# Patient Record
Sex: Male | Born: 1935 | Race: White | Hispanic: No | Marital: Married | Smoking: Former smoker
Health system: Southern US, Community
[De-identification: ages and names within clinical notes are randomized; demographics above are authoritative.]

## PROBLEM LIST (undated history)

## (undated) DIAGNOSIS — G709 Myoneural disorder, unspecified: Secondary | ICD-10-CM

## (undated) DIAGNOSIS — H3530 Unspecified macular degeneration: Secondary | ICD-10-CM

## (undated) DIAGNOSIS — D649 Anemia, unspecified: Secondary | ICD-10-CM

## (undated) DIAGNOSIS — I499 Cardiac arrhythmia, unspecified: Secondary | ICD-10-CM

## (undated) DIAGNOSIS — R0602 Shortness of breath: Secondary | ICD-10-CM

## (undated) DIAGNOSIS — L02619 Cutaneous abscess of unspecified foot: Secondary | ICD-10-CM

## (undated) DIAGNOSIS — M1990 Unspecified osteoarthritis, unspecified site: Secondary | ICD-10-CM

## (undated) DIAGNOSIS — I739 Peripheral vascular disease, unspecified: Secondary | ICD-10-CM

## (undated) DIAGNOSIS — E119 Type 2 diabetes mellitus without complications: Secondary | ICD-10-CM

## (undated) DIAGNOSIS — K219 Gastro-esophageal reflux disease without esophagitis: Secondary | ICD-10-CM

## (undated) DIAGNOSIS — L03039 Cellulitis of unspecified toe: Secondary | ICD-10-CM

## (undated) DIAGNOSIS — K5900 Constipation, unspecified: Secondary | ICD-10-CM

## (undated) DIAGNOSIS — I2510 Atherosclerotic heart disease of native coronary artery without angina pectoris: Secondary | ICD-10-CM

## (undated) DIAGNOSIS — G459 Transient cerebral ischemic attack, unspecified: Secondary | ICD-10-CM

## (undated) DIAGNOSIS — J449 Chronic obstructive pulmonary disease, unspecified: Secondary | ICD-10-CM

## (undated) DIAGNOSIS — J189 Pneumonia, unspecified organism: Secondary | ICD-10-CM

## (undated) DIAGNOSIS — I639 Cerebral infarction, unspecified: Secondary | ICD-10-CM

## (undated) DIAGNOSIS — I10 Essential (primary) hypertension: Secondary | ICD-10-CM

## (undated) DIAGNOSIS — Z9981 Dependence on supplemental oxygen: Secondary | ICD-10-CM

## (undated) HISTORY — PX: CARDIAC CATHETERIZATION: SHX172

## (undated) HISTORY — PX: FOOT SURGERY: SHX648

## (undated) HISTORY — PX: TRANSURETHRAL RESECTION OF PROSTATE: SHX73

## (undated) HISTORY — PX: VASCULAR SURGERY: SHX849

## (undated) HISTORY — PX: COLONOSCOPY: SHX174

---

## 2005-04-07 ENCOUNTER — Emergency Department (HOSPITAL_COMMUNITY): Admission: EM | Admit: 2005-04-07 | Discharge: 2005-04-07 | Payer: Self-pay | Admitting: Emergency Medicine

## 2005-06-10 ENCOUNTER — Encounter (INDEPENDENT_AMBULATORY_CARE_PROVIDER_SITE_OTHER): Payer: Self-pay | Admitting: Specialist

## 2005-06-10 ENCOUNTER — Ambulatory Visit (HOSPITAL_COMMUNITY): Admission: RE | Admit: 2005-06-10 | Discharge: 2005-06-10 | Payer: Self-pay | Admitting: Urology

## 2008-09-23 DIAGNOSIS — G459 Transient cerebral ischemic attack, unspecified: Secondary | ICD-10-CM

## 2008-09-23 HISTORY — DX: Transient cerebral ischemic attack, unspecified: G45.9

## 2008-11-02 ENCOUNTER — Inpatient Hospital Stay (HOSPITAL_COMMUNITY): Admission: AD | Admit: 2008-11-02 | Discharge: 2008-11-07 | Payer: Self-pay | Admitting: Internal Medicine

## 2008-11-02 ENCOUNTER — Ambulatory Visit: Payer: Self-pay | Admitting: Radiology

## 2008-11-02 ENCOUNTER — Encounter: Payer: Self-pay | Admitting: Emergency Medicine

## 2008-11-02 ENCOUNTER — Ambulatory Visit: Payer: Self-pay | Admitting: Cardiology

## 2008-11-03 ENCOUNTER — Ambulatory Visit: Payer: Self-pay | Admitting: Vascular Surgery

## 2008-11-03 ENCOUNTER — Encounter (INDEPENDENT_AMBULATORY_CARE_PROVIDER_SITE_OTHER): Payer: Self-pay | Admitting: Internal Medicine

## 2008-11-04 ENCOUNTER — Encounter (INDEPENDENT_AMBULATORY_CARE_PROVIDER_SITE_OTHER): Payer: Self-pay | Admitting: Gastroenterology

## 2009-01-22 ENCOUNTER — Inpatient Hospital Stay (HOSPITAL_COMMUNITY): Admission: EM | Admit: 2009-01-22 | Discharge: 2009-01-25 | Payer: Self-pay | Admitting: Internal Medicine

## 2009-01-22 ENCOUNTER — Ambulatory Visit: Payer: Self-pay | Admitting: Diagnostic Radiology

## 2009-01-22 ENCOUNTER — Encounter: Payer: Self-pay | Admitting: Emergency Medicine

## 2009-01-22 ENCOUNTER — Ambulatory Visit: Payer: Self-pay | Admitting: Cardiovascular Disease

## 2011-01-01 LAB — CBC
HCT: 39.3 % (ref 39.0–52.0)
Hemoglobin: 11.9 g/dL — ABNORMAL LOW (ref 13.0–17.0)
Hemoglobin: 12.9 g/dL — ABNORMAL LOW (ref 13.0–17.0)
MCHC: 34.1 g/dL (ref 30.0–36.0)
MCHC: 32.7 g/dL (ref 30.0–36.0)
MCV: 86.3 fL (ref 78.0–100.0)
MCV: 86.8 fL (ref 78.0–100.0)
MCV: 87 fL (ref 78.0–100.0)
RBC: 4.52 MIL/uL (ref 4.22–5.81)
RDW: 15.4 % (ref 11.5–15.5)
RDW: 14.7 % (ref 11.5–15.5)
WBC: 8.7 10*3/uL (ref 4.0–10.5)

## 2011-01-01 LAB — CROSSMATCH: ABO/RH(D): O POS

## 2011-01-01 LAB — APTT: aPTT: 42 seconds — ABNORMAL HIGH (ref 24–37)

## 2011-01-01 LAB — BASIC METABOLIC PANEL
BUN: 18 mg/dL (ref 6–23)
Calcium: 8.9 mg/dL (ref 8.4–10.5)
GFR calc non Af Amer: >60 mL/min (ref >60)
Glucose, Bld: 136 mg/dL — ABNORMAL HIGH (ref 70–99)

## 2011-01-01 LAB — HEMOGLOBIN AND HEMATOCRIT, BLOOD
HCT: 34.6 % — ABNORMAL LOW (ref 39.0–52.0)
HCT: 35.7 % — ABNORMAL LOW (ref 39.0–52.0)
Hemoglobin: 11.7 g/dL — ABNORMAL LOW (ref 13.0–17.0)

## 2011-01-01 LAB — PHOSPHORUS: Phosphorus: 3.3 mg/dL (ref 2.3–4.6)

## 2011-01-01 LAB — COMPREHENSIVE METABOLIC PANEL
ALT: 11 U/L (ref 0–53)
ALT: 11 U/L (ref 0–53)
Alkaline Phosphatase: 75 U/L (ref 39–117)
BUN: 16 mg/dL (ref 6–23)
CO2: 28 mEq/L (ref 19–32)
Calcium: 9.1 mg/dL (ref 8.4–10.5)
Calcium: 9.3 mg/dL (ref 8.4–10.5)
Chloride: 108 mEq/L (ref 96–112)
Creatinine, Ser: 0.9 mg/dL (ref 0.4–1.5)
GFR calc Af Amer: >60 mL/min (ref >60)
GFR calc non Af Amer: >60 mL/min (ref >60)
Glucose, Bld: 96 mg/dL (ref 70–99)
Glucose, Bld: 134 mg/dL — ABNORMAL HIGH (ref 70–99)
Potassium: 4 mEq/L (ref 3.5–5.1)
Sodium: 142 mEq/L (ref 135–145)
Sodium: 144 mEq/L (ref 135–145)
Total Protein: 7.2 g/dL (ref 6.0–8.3)
Total Protein: 8.3 g/dL (ref 6.0–8.3)

## 2011-01-01 LAB — GLUCOSE, CAPILLARY
Glucose-Capillary: 105 mg/dL — ABNORMAL HIGH (ref 70–99)
Glucose-Capillary: 120 mg/dL — ABNORMAL HIGH (ref 70–99)
Glucose-Capillary: 123 mg/dL — ABNORMAL HIGH (ref 70–99)
Glucose-Capillary: 128 mg/dL — ABNORMAL HIGH (ref 70–99)
Glucose-Capillary: 129 mg/dL — ABNORMAL HIGH (ref 70–99)
Glucose-Capillary: 135 mg/dL — ABNORMAL HIGH (ref 70–99)
Glucose-Capillary: 135 mg/dL — ABNORMAL HIGH (ref 70–99)
Glucose-Capillary: 98 mg/dL (ref 70–99)

## 2011-01-01 LAB — DIFFERENTIAL
Eosinophils Absolute: 0 10*3/uL (ref 0.0–0.7)
Eosinophils Absolute: 0 10*3/uL (ref 0.0–0.7)
Lymphocytes Relative: 22 % (ref 12–46)
Lymphs Abs: 1.9 10*3/uL (ref 0.7–4.0)
Lymphs Abs: 1.3 10*3/uL (ref 0.7–4.0)
Monocytes Relative: 13 % — ABNORMAL HIGH (ref 3–12)
Neutro Abs: 5.5 10*3/uL (ref 1.7–7.7)
Neutro Abs: 9.3 10*3/uL — ABNORMAL HIGH (ref 1.7–7.7)
Neutrophils Relative %: 64 % (ref 43–77)
Neutrophils Relative %: 78 % — ABNORMAL HIGH (ref 43–77)

## 2011-01-01 LAB — LIPID PANEL
LDL Cholesterol: 83 mg/dL (ref 0–99)
Total CHOL/HDL Ratio: 5.2 RATIO
Triglycerides: 105 mg/dL (ref <150)

## 2011-01-01 LAB — PROTIME-INR
INR: 2.4 — ABNORMAL HIGH (ref 0.00–1.49)
Prothrombin Time: 28.1 seconds — ABNORMAL HIGH (ref 11.6–15.2)

## 2011-01-01 LAB — HEMOCCULT GUIAC POC 1CARD (OFFICE): Fecal Occult Bld: POSITIVE

## 2011-01-01 LAB — MAGNESIUM: Magnesium: 2.2 mg/dL (ref 1.5–2.5)

## 2011-01-01 LAB — HEMOGLOBIN A1C: Hgb A1c MFr Bld: 6.1 % (ref 4.6–6.1)

## 2011-01-08 LAB — PHOSPHORUS: Phosphorus: 3.7 mg/dL (ref 2.3–4.6)

## 2011-01-08 LAB — CROSSMATCH

## 2011-01-08 LAB — BASIC METABOLIC PANEL
BUN: 17 mg/dL (ref 6–23)
CO2: 27 mEq/L (ref 19–32)
Calcium: 8.8 mg/dL (ref 8.4–10.5)
Chloride: 103 mEq/L (ref 96–112)
Chloride: 108 mEq/L (ref 96–112)
Creatinine, Ser: 0.93 mg/dL (ref 0.4–1.5)
GFR calc Af Amer: >60 mL/min (ref >60)
GFR calc Af Amer: >60 mL/min (ref >60)
GFR calc non Af Amer: >60 mL/min (ref >60)
Potassium: 4.1 mEq/L (ref 3.5–5.1)
Sodium: 135 mEq/L (ref 135–145)
Sodium: 135 mEq/L (ref 135–145)

## 2011-01-08 LAB — HEMOCCULT GUIAC POC 1CARD (OFFICE): Fecal Occult Bld: POSITIVE

## 2011-01-08 LAB — COMPREHENSIVE METABOLIC PANEL
ALT: 19 U/L (ref 0–53)
ALT: 23 U/L (ref 0–53)
AST: 25 U/L (ref 0–37)
Albumin: 3.4 g/dL — ABNORMAL LOW (ref 3.5–5.2)
Albumin: 4.3 g/dL (ref 3.5–5.2)
Alkaline Phosphatase: 57 U/L (ref 39–117)
Alkaline Phosphatase: 79 U/L (ref 39–117)
Chloride: 105 mEq/L (ref 96–112)
GFR calc Af Amer: >60 mL/min (ref >60)
Glucose, Bld: 131 mg/dL — ABNORMAL HIGH (ref 70–99)
Potassium: 4.4 mEq/L (ref 3.5–5.1)
Potassium: 4.6 mEq/L (ref 3.5–5.1)
Sodium: 136 mEq/L (ref 135–145)
Sodium: 139 mEq/L (ref 135–145)
Total Bilirubin: 0.9 mg/dL (ref 0.3–1.2)
Total Protein: 7.5 g/dL (ref 6.0–8.3)

## 2011-01-08 LAB — CBC
HCT: 21.4 % — ABNORMAL LOW (ref 39.0–52.0)
HCT: 29.4 % — ABNORMAL LOW (ref 39.0–52.0)
HCT: 29.6 % — ABNORMAL LOW (ref 39.0–52.0)
HCT: 26.3 % — ABNORMAL LOW (ref 39.0–52.0)
Hemoglobin: 10 g/dL — ABNORMAL LOW (ref 13.0–17.0)
Hemoglobin: 10.1 g/dL — ABNORMAL LOW (ref 13.0–17.0)
Hemoglobin: 11.3 g/dL — ABNORMAL LOW (ref 13.0–17.0)
Hemoglobin: 8.9 g/dL — ABNORMAL LOW (ref 13.0–17.0)
MCHC: 34.5 g/dL (ref 30.0–36.0)
MCHC: 34.9 g/dL (ref 30.0–36.0)
MCHC: 35.1 g/dL (ref 30.0–36.0)
MCHC: 35.5 g/dL (ref 30.0–36.0)
MCHC: 35.5 g/dL (ref 30.0–36.0)
MCV: 91.1 fL (ref 78.0–100.0)
MCV: 91.4 fL (ref 78.0–100.0)
MCV: 91.9 fL (ref 78.0–100.0)
MCV: 93.4 fL (ref 78.0–100.0)
MCV: 93.7 fL (ref 78.0–100.0)
MCV: 95.4 fL (ref 78.0–100.0)
Platelets: 191 10*3/uL (ref 150–400)
Platelets: 198 10*3/uL (ref 150–400)
Platelets: 208 10*3/uL (ref 150–400)
Platelets: 250 10*3/uL (ref 150–400)
Platelets: 229 10*3/uL (ref 150–400)
RBC: 2.25 MIL/uL — ABNORMAL LOW (ref 4.22–5.81)
RBC: 3.03 MIL/uL — ABNORMAL LOW (ref 4.22–5.81)
RBC: 3.09 MIL/uL — ABNORMAL LOW (ref 4.22–5.81)
RBC: 3.15 MIL/uL — ABNORMAL LOW (ref 4.22–5.81)
RBC: 3.23 MIL/uL — ABNORMAL LOW (ref 4.22–5.81)
RDW: 15.8 % — ABNORMAL HIGH (ref 11.5–15.5)
RDW: 15.9 % — ABNORMAL HIGH (ref 11.5–15.5)
RDW: 16.4 % — ABNORMAL HIGH (ref 11.5–15.5)
RDW: 16.6 % — ABNORMAL HIGH (ref 11.5–15.5)
RDW: 16.9 % — ABNORMAL HIGH (ref 11.5–15.5)
WBC: 10.3 10*3/uL (ref 4.0–10.5)
WBC: 11.4 10*3/uL — ABNORMAL HIGH (ref 4.0–10.5)
WBC: 11.8 10*3/uL — ABNORMAL HIGH (ref 4.0–10.5)
WBC: 8.4 10*3/uL (ref 4.0–10.5)
WBC: 8.9 10*3/uL (ref 4.0–10.5)

## 2011-01-08 LAB — PREPARE FRESH FROZEN PLASMA

## 2011-01-08 LAB — DIFFERENTIAL
Basophils Absolute: 0 10*3/uL (ref 0.0–0.1)
Eosinophils Absolute: 0.2 10*3/uL (ref 0.0–0.7)
Eosinophils Relative: 1 % (ref 0–5)
Eosinophils Relative: 2 % (ref 0–5)
Eosinophils Relative: 0 % (ref 0–5)
Lymphocytes Relative: 17 % (ref 12–46)
Lymphocytes Relative: 9 % — ABNORMAL LOW (ref 12–46)
Lymphs Abs: 1.8 10*3/uL (ref 0.7–4.0)
Lymphs Abs: 1.2 10*3/uL (ref 0.7–4.0)
Monocytes Relative: 14 % — ABNORMAL HIGH (ref 3–12)
Monocytes Relative: 9 % (ref 3–12)
Neutrophils Relative %: 64 % (ref 43–77)
Neutrophils Relative %: 70 % (ref 43–77)

## 2011-01-08 LAB — GLUCOSE, CAPILLARY
Glucose-Capillary: 102 mg/dL — ABNORMAL HIGH (ref 70–99)
Glucose-Capillary: 124 mg/dL — ABNORMAL HIGH (ref 70–99)
Glucose-Capillary: 125 mg/dL — ABNORMAL HIGH (ref 70–99)
Glucose-Capillary: 135 mg/dL — ABNORMAL HIGH (ref 70–99)
Glucose-Capillary: 158 mg/dL — ABNORMAL HIGH (ref 70–99)
Glucose-Capillary: 177 mg/dL — ABNORMAL HIGH (ref 70–99)
Glucose-Capillary: 92 mg/dL (ref 70–99)
Glucose-Capillary: 95 mg/dL (ref 70–99)
Glucose-Capillary: 97 mg/dL (ref 70–99)

## 2011-01-08 LAB — PROTIME-INR
INR: 1.1 (ref 0.00–1.49)
INR: 1.2 (ref 0.00–1.49)
INR: 1.2 (ref 0.00–1.49)
INR: 1.3 (ref 0.00–1.49)
INR: 3 — ABNORMAL HIGH (ref 0.00–1.49)
Prothrombin Time: 15.2 seconds (ref 11.6–15.2)
Prothrombin Time: 16.2 seconds — ABNORMAL HIGH (ref 11.6–15.2)
Prothrombin Time: 33.2 seconds — ABNORMAL HIGH (ref 11.6–15.2)

## 2011-01-08 LAB — URINE MICROSCOPIC-ADD ON

## 2011-01-08 LAB — HEMOGLOBIN A1C
Hgb A1c MFr Bld: 5.5 % (ref 4.6–6.1)
Mean Plasma Glucose: 111 mg/dL

## 2011-01-08 LAB — URINALYSIS, ROUTINE W REFLEX MICROSCOPIC
Nitrite: NEGATIVE
Specific Gravity, Urine: 1.021 (ref 1.005–1.030)
pH: 7 (ref 5.0–8.0)

## 2011-01-08 LAB — MAGNESIUM: Magnesium: 1.8 mg/dL (ref 1.5–2.5)

## 2011-01-08 LAB — ABO/RH: ABO/RH(D): O POS

## 2011-02-05 NOTE — Discharge Summary (Signed)
[name], EDLER             ACCOUNT NO.:  [id number]   MEDICAL RECORD NO.:  [id number]          PATIENT TYPE:  INP   LOCATION:  3709                         FACILITY:  [facility]   PHYSICIAN:  Altha Harm, MDDATE OF BIRTH:  1928/09/18   DATE OF ADMISSION:  11/02/2008  DATE OF DISCHARGE:  11/07/2008                               DISCHARGE SUMMARY   DISCHARGE DISPOSITION:  Home.   FINAL DISCHARGE DIAGNOSES:  1. Gastrointestinal bleeding.  2. Acute blood loss anemia secondary to number 1.  3. Syncope secondary to number 1 and 2.  4. History of atrial fib heart rate controlled.  5. Diabetes type 2.  6. Hypertension.  7. Hyperlipidemia.  8. Gait disorder.  9. Warfarin coagulopathy.  10.Atrial pause, asymptomatic.   DISCHARGE MEDICATIONS:  1. Amlodipine 5 mg p.o. daily.  2. Aspirin and 81 mg p.o. daily.  3. Glipizide 5 mg p.o. daily.  4. Lisinopril 20 mg p.o. daily.  5. Metformin 500 mg p.o. daily.  6. Simvastatin 40 mg p.o. nightly.  7. Warfarin and 10 mg p.o. tonight then draw INR in the morning and      Coumadin to be titrated by [facility].  8. Omeprazole 20 mg p.o. daily indefinitely.   CONSULTANTS:  Dr. Matthias Hughs.   PROCEDURES:  1. EGD shows superficial stellate duodenal ulcerations consistent with      recent ulcer in healing phase.  2. Colonoscopy which showed x2 diminutive polyps which were biopsied.   PRIMARY CARE PHYSICIAN:  Dr. Andrey Campanile at [facility].   CODE STATUS:  Full Code.   ALLERGIES:  No known drug allergies.   CHIEF COMPLAINT:  Syncope.   HISTORY OF PRESENT ILLNESS:  Please refer to the H and P dictated by Dr.  Selena Batten for details of the HPI.   HOSPITAL COURSE:  1. Acute gastrointestinal bleeding.  The patient presented with      complaint of syncope.  Upon evaluation the patient was found to      have melanotic stool which he had for approximately 2 weeks period.      His hemoglobin was low at 8.9.  The  patient was admitted and given      supportive care.  He was transfused a total of 2 units of packed      red blood cells and 2 units of FFP.  The patient then underwent      endoscopy, both EGD and colonoscopy with findings as noted above.      The patient was noted to have a recently healing ulcer and the      recommendation is the patient was to be restarted on his Coumadin      and aspirin with a PPI for protection.  Dr. Matthias Hughs noted that, as      long as the patient was not taking Plavix, it was perfectly fine      for him to be on omeprazole.  The patient showed no further      evidence of bleeding and his hemoglobin remained stable.  The time      of discharge, the  patient had a hemoglobin and 11, hematocrit 31.4,      a platelet count of 255,000, and a WBC count of 9.8.  2. Warfarin induced coagulopathy.  The patient had been on Coumadin on      a long-term basis.  On presentation to the emergency room the      patient had an INR of 3.0.  The patient received 2 units of FFP and      1 dose of vitamin K.  The patient had been restarted on his      Coumadin.  However, I suspect, due to the FFP, the patient has been      slow at reaching his goal INR.  It is been discussed with the      patient his need for heparin for overlap therapy while the      Coumadin, and the patient has refused heparin stating that when he      was started on Coumadin, he did not require heparin.  He does not      want to take heparin at this time.  The patient is being sent home      on Coumadin and he will take 10 units of warfarin tonight and have      his INR checked tomorrow by the home health nurse and follow up at      [facility] for further titration of his      medications.  I suspect that it will probably take maybe 2 to 3      doses of 10 mg to get the Coumadin to therapeutic levels within the      blood.  3. Diabetes type 2.  The patient's metformin was initially held due to       his n.p.o. status.  Once the patient was eating, he was restarted      on his diabetic medications with good control with blood sugars.  4. Atrial fibrillation with a 2.94 second pause.  The patient had a 2-      D echocardiogram which was essentially normal.  With this atrial      pause that the patient had a cardiology was asked to see the      patient who felt that there is no further workup needed for the      patient.  His atrial fibrillation remained under good control.   Otherwise the patient remained stable in the hospital.  The patient at  this time is ambulatory.   PERTINENT LABORATORY STUDIES:  At the time of discharge white blood cell  count 8.9, hemoglobin 10.5, hematocrit 29.4, platelet count 201,000, INR  is 1.1, pro time 14.4.  Glucose 138, sodium 135, potassium 4.1, chloride  103, bicarb 26, BUN 14, creatinine 1.01.   DIETARY RESTRICTIONS:  The patient should be on a diabetic heart-healthy  vitamin K stable diet.   PHYSICAL RESTRICTIONS:  None.   FOLLOW UP:  The patient is to follow up with Dr. Andrey Campanile in 3-5 days and  with Dr. Matthias Hughs on February 23 at 3:15.  Instructions for Coumadin  followup have been given to this patient as discharge instructions as  well as the phone  numbers for both Dr. Matthias Hughs and [facility].  The  patient will go home with home health care agency to be determined  by  care coordinator   Time spent in discharge process 40 mins      Altha Harm, MD  Electronically Signed  MAM/MEDQ  D:  11/07/2008  T:  11/07/2008  Job:  [id number]   cc:   [facility]  Bernette Redbird, M.D.

## 2011-02-05 NOTE — Op Note (Signed)
[name], [name]             ACCOUNT NO.:  [id number]   MEDICAL RECORD NO.:  [id number]          PATIENT TYPE:  INP   LOCATION:  4703                         FACILITY:  [facility]   PHYSICIAN:  Bernette Redbird, M.D.   DATE OF BIRTH:  1928/01/07   DATE OF PROCEDURE:  01/23/2009  DATE OF DISCHARGE:                               OPERATIVE REPORT   PROCEDURE:  Upper endoscopy.   INDICATIONS:  A 77 year old gentleman, who presented with a bleeding  duodenal ulcer approximately 4 months ago, while on aspirin and  Coumadin, managed with PPI therapy, which he has remained on to the  present time, along with his aspirin and Coumadin.  He then presented  again to the emergency room yesterday with a history of melenic and red  stools, without hemodynamic instability, drop in hemoglobin, or  elevation of BUN.   FINDINGS:  Essentially normal examination.   PROCEDURE:  The nature, purpose, and risks of the procedure had been  reviewed with the patient, who provided written consent, having been  brought from his hospital room to the Endoscopy Unit.  Sedation was  fentanyl 50 mcg and Versed 6 mg IV without clinical instability or  desaturation.  The Pentax adult video endoscope was passed under direct  vision.  The larynx was grossly normal.  The esophagus was entered  without significant difficulty and was endoscopically normal, without  evidence of reflux esophagitis, free reflux, Barrett esophagus, Mallory-  Weiss tear, varices, infection, neoplasia, hiatal hernia, ring, or  stricture.   The stomach was entered.  It contained no blood or coffee-ground  material or any other gastric residual such as bile or fluid or food.  The gastric mucosa was normal.  No gastritis, erosions, ulcers, polyps,  or masses were noted including a retroflexed view of the cardia.   The pylorus was normal.   The duodenal bulb had, on the floor, a roughly 3-4 mm patch of white  mucosa, probably corresponding to  the site of the healed ulcer from  several months ago.  It did not look like an active ulcer at this time  and in particular, there was certainly no stigma of hemorrhage,  whatsoever.  No other ulcers were seen.  There was some minimal duodenal  bulbar deformity possibly indicative of prior peptic disease, although  overall it was not a significantly peptic appearing duodenal bulb.  The second duodenum looked normal, including the major papilla.   The scope was removed from the patient.  He tolerated the procedure well  and there were no apparent complications.   IMPRESSION:  1. No active bleeding or blood in the stomach at the time of this      exam.  2. No prospective bleeding site identified to account for the      patient's recent hematochezia.  3. Probable site of previous ulcer (healed) identified.   DISCUSSION:  Based on the clinical presentation (see indications above),  the fact that the patient has been maintained on PPI therapy, and the  paucity of findings on current examination, I am almost sure that his  presenting hematochezia was  of lower GI origin, conceivably from the  small bowel, but more likely from the colon.  In this regard, it is  noteworthy that he had colonoscopic evaluation by me back in February,  without any major findings and in particular, no vascular ectasia or  diverticular disease noted.  However, it is quite possible that he has  some degree of diverticulosis that was not noted colonoscopically, and I  suppose it is conceivable he had had an attack of ischemic colitis with  associated hematochezia at this time around.   RECOMMENDATIONS:  Observation.  Okay to advance diet, back off on IV  fluids, continue to monitor labs, and hopefully discharge in a day or  two if stable.           ______________________________  Bernette Redbird, M.D.     RB/MEDQ  D:  01/23/2009  T:  01/23/2009  Job:  [id number]   cc:   Massie Maroon, MD

## 2011-02-05 NOTE — Consult Note (Signed)
NAMEDAVIT, VASSAR NO.:  [id number]   MEDICAL RECORD NO.:  [id number]          PATIENT TYPE:  INP   LOCATION:  3709                         FACILITY[facility]  [facility]   PHYSICIAN:  Luis Abed, MD, FACCDATE OF BIRTH:  1927/11/20   DATE OF CONSULTATION:  11/06/2008  DATE OF DISCHARGE:                                 CONSULTATION   Mr. Cabreja is 77 years of age.  He gives a history of having had some  cardiac tests in the past, although I cannot document them.  He  presented with syncope.  In fact, he had significant decrease in his  hemoglobin in the range of 7.7.  He sat up in the middle of the night  and was beginning to drink a soft drink.  He had a little gagging and  then had syncope.  It appears that his event was most probably a  combination of significant anemia and possibly additional vagal  response.  The patient has chronic atrial fibrillation.  I do not have  old information about this.   In the hospital, he has been stable.  He has receive very nice care  concerning his GI tract and his hemoglobin.  He is being stabilized.  His Coumadin had been held and is now being resumed.   His pulse rate has been in the range of 65-80.  He did have some slowing  with a 2.8-second pause at 3 o'clock in the morning while sleeping.  He  has had no documented marked bradycardia while being up and around  during the day.  There is no proven coronary disease in the past.   ALLERGIES:  No known drug allergies.   MEDICATIONS PRIOR TO ADMISSION:  He was on aspirin and Coumadin,  Norvasc, glipizide, lisinopril, metformin, and simvastatin.   OTHER MEDICAL PROBLEMS:  See the complete list below.   SOCIAL HISTORY:  The patient quit smoking 10 years ago.  Prior to that,  he smoked one pack per day for 40 years.  He does not drink.   FAMILY HISTORY:  There is no strong family history of coronary artery  disease and otherwise his family history is noncontributory.   REVIEW OF SYSTEMS:  The patient is not having any headaches.  He has no  eye problems or dental problems.  There is no shortness of breath.  He  has no GI or GU symptoms at this point.  There are no fevers or chills.  He has no skin rashes.  His review of systems otherwise is negative.   PHYSICAL EXAMINATION:  VITAL SIGNS:  Blood pressure is 120/88 with a  pulse of 71.  The patient is oriented to person, time, and place.  Affect is normal.  HEENT:  No xanthelasma.  He has the healing bruise on his head after his  injury from falling with syncope.  He has normal extraocular motion.  NECK:  There are no carotid bruits.  There is no jugular venous  distention.  LUNGS:  Clear.  Respiratory effort is not labored.  CARDIAC:  S1 and S2.  There is a 2/6  systolic murmur.  ABDOMEN:  Soft.  He has no significant peripheral edema.  NEUROLOGIC:  He is grossly intact at this time.   The patient had CT brain scan with multiple lunar infarcts in the past  and a left frontal chronic hygroma.  His BUN was 36 and creatinine 0.9  on admission.  EKG revealed atrial fibrillation with no acute change.  The patient had a 2D echo done during the hospitalization.  Ejection  fraction is 55%.  The study was technically difficult.  Therefore, wall  motion abnormalities cannot be actually ruled out.  There was mild MR  and trivial pericardial effusion.   PROBLEMS:  1. Significant anemia with presentation.  2. Complete gastrointestinal evaluation.  3. Chronic subdural hygroma.  4. Diabetes.  5. Neuropathy.  6. Chronic atrial fibrillation.  We do not know other information      about his atrial fibrillation.  7. History of bladder cancer.  8. Urinary tract infection.  This was treated during the      hospitalization.  9. Syncope.  I believe that his syncopal episode was related to marked      anemia with a hemoglobin is 7.7 and with some choking related to a      drink at that time.  There is no evidence that  he has bradycardia      that is significant enough to require a pacemaker.   The patient does have some slowing of his atrial fib rate in the middle  of the night.  He is not on any AV nodal blocking agents.  However,  during the day, his heart rate has been adequate.   For now, no further cardiac workup.  Of course if he has any falling  difficulties over time.  Coumadin will be very dangerous for him.  However, at this point with his atrial fib Coumadin would still be  recommended unless there are further difficulties.   I spoke with the patient's daughter, Misty Stanley, at telephone number [phone].  She would like for Mr. Mihalko to be able to see me for Cardiology  followup over time and I will be happy to do that in the future.      Luis Abed, MD, [facility]  Electronically Signed     JDK/MEDQ  D:  11/06/2008  T:  11/07/2008  Job:  [id number]   cc:   [facility]

## 2011-02-05 NOTE — H&P (Signed)
[name], SAVAGE NO.:  [id number]   MEDICAL RECORD NO.:  [id number]          PATIENT TYPE:  INP   LOCATION:  2101                         FACILITY:  [facility]   PHYSICIAN:  Massie Maroon, MD        DATE OF BIRTH:  06-Jan-1928   DATE OF ADMISSION:  11/02/2008  DATE OF DISCHARGE:                              HISTORY & PHYSICAL   CHIEF COMPLAINT:  Syncope.   HISTORY OF PRESENT ILLNESS:  A 77 year old male with a history of atrial  fibrillation, diabetes, diabetic neuropathy was apparently lying in bed  and got up and then took a drink of water and then found himself on the  floor.  He thinks that he lost consciousness for about a minute.  The  episode was preceded by dizziness.  The patient denies any complaints of  headache, vertigo, chest pain, palpitations, shortness of breath,  nausea, vomiting, or diarrhea.  He does note some black stool over the  last month.  He was dizzy yesterday as well.  He has had no prior  history of syncopal episode.  The patient was taken to the ED at the  [facility] ED.  He was there evaluated and found to have a  hemoglobin of 8.9 and be anemic.  His BUN was 36.  His Hemoccult stool  was positive.  A CT brain showed multiple remote lacunar infarcts in the  left frontal chronic hygroma.  The patient will be admitted for syncope  as well as possible GI bleed.   PAST MEDICAL HISTORY:  1. Hypertension.  2. Hyperlipidemia.  3. Diabetes.  4. Neuropathy.  5. Atrial fibrillation.  6. History of proctitis per records.  7. Transitional cell carcinoma of the bladder.   PAST SURGICAL HISTORY:  1. Bladder cancer, transitional cell carcinoma, status post TURBT with      fulguration and PVIP  June 10, 2005.  2. Cholecystectomy.  3. Left foot fracture.   SOCIAL HISTORY:  The patient quit smoking about 10 years ago.  He smoked  1 pack per day x40 years.  He does not drink.  He lives at home with his  girlfriend.  He has 2  children from his second marriage and 5 children  from his first marriage.   FAMILY HISTORY:  Mother died of old age in her 42s.  Father had colon  cancer.  There is no history of coronary artery disease or diabetes in  his family.   REVIEW OF SYSTEMS:  Black stool last month, otherwise negative for all  10-organ systems except for pertinent positives as stated above.   PHYSICAL EXAMINATION:  VITAL SIGNS:  Temperature 97.9, pulse 70,  respirations 16, blood pressure 149/73, and pulse oximetry 99% on room  air.  HEENT:  Anicteric, EOMI, no nystagmus, pupils 1.5 mm, symmetric, direct,  consensual, near reflexes intact, mucous membranes moist.  Tongue  midline.  NECK:  No JVD, no bruit, no thyromegaly, no adenopathy.  HEART:  Regular rate and rhythm.  S1 and S2.  No murmurs, gallops, or  rubs.  LUNGS:  Clear to  auscultation bilaterally.  ABDOMEN:  Soft, nontender, and nondistended.  Positive bowel sounds.  EXTREMITIES:  No cyanosis, clubbing or edema, hammer toes, DT pulses 1+  bilaterally.  SKIN:  No rashes.  LYMPH NODES:  No adenopathy.  NEURO:  Cranial nerves II-XII intact, reflexes 2+, symmetric, diffuse  with downgoing toes bilaterally, strength is 5/5 in all 4 extremities,  pinprick intact.   CT brain showed multiple remote lacunar infarcts, and the left frontal  chronic hygroma.  His stool was Hemoccult positive.  Chest x-ray was  negative for any acute process.   LABORATORY DATA:  Sodium 139, potassium 4.6, chloride 105, bicarb 26,  BUN 36, creatinine 0.9, and glucose 131.  AST 28, ALT 23, alk phos 79, total bilirubin 0.4, and INR 3.0.  CPK-MB 2.9 and troponin-I less than 0.05.  WBC 12.7, hemoglobin 8.9, and platelet count 229.  Urinalysis showed 3-6 wbc's and 0-2 rbc's.   EKG per ER report showed atrial fibrillation.   ASSESSMENT:  1. Anemia.  2. Rule out gastrointestinal bleed.  3. Syncope.  4. Subdural chronic hygroma.  5. Diabetes.  6. Neuropathy.  7. Atrial  fibrillation.  8. History of bladder cancer.  9. Urinary tract infection.   PLAN:  1. Syncope:  The patient will be placed on telemetry in the ICU.  We      will check troponin-I q.8 hours x3 sets.  We will check a carotid      ultrasound, cardiac echo for workup of the syncope.  2. Subdural chronic hygroma:  We will check an MRI with or without      contrast to evaluate his subdural, supposedly chronic hygroma.  We      will stop Coumadin.  Stop aspirin at this time.  3. GI bleeding/anemia:  We will stop Coumadin and then stop aspirin as      stated above.  I am uncertain as to why he is on both Coumadin and      aspirin other than possibly due to his stroke.  We will obtain a GI      consult for possible EGD in the a.m.  The patient will be made      n.p.o. after midnight.  We will check CBC q.12 h. for now.  The      patient will be started on Protonix 80 mg IV q.8.  4. UTI:  The patient will be treated with Cipro 500 mg p.o. b.i.d. x7      days for his UTI.  5. Diabetes:  The patient will be covered with glipizide, metformin,      and insulin sliding scale.  We will check      fingerstick blood sugars a.c. and nightly.  6. Bladder cancer:  For his history of bladder cancer, I recommended      that he followup with Dr. Isabel Caprice as an outpatient at some point.      Massie Maroon, MD  Electronically Signed     JYK/MEDQ  D:  11/02/2008  T:  11/03/2008  Job:  [id number]   cc:   Valetta Fuller, M.D.  [facility]

## 2011-02-05 NOTE — Consult Note (Signed)
[name], [name]             ACCOUNT NO.:  [id number]   MEDICAL RECORD NO.:  [id number]          PATIENT TYPE:  INP   LOCATION:  2608                         FACILITY:  [facility]   PHYSICIAN:  Bernette Redbird, M.D.   DATE OF BIRTH:  1928-04-07   DATE OF CONSULTATION:  DATE OF DISCHARGE:                                 CONSULTATION   We were asked to see Mr. Mitchner today in consultation for GI bleeding  by Dr. Ashley Royalty of [facility].   HISTORY OF PRESENT ILLNESS:  This is a 77 year old male who was admitted  with syncope.  He reports red blood in his stools on the toilet paper  and in the toilet water for approximately 1 month, then he took a  laxative yesterday and had black stools x3.  His appetite has been good.  However, he reports that he has lost 60 pounds in the last 2-3 years.  He used to weight 260, now he weighs 202 but says that he had tried to  lose some of his weight.  The patient denies abdominal pain, heartburn,  and vomiting.  He reports a daily 81 mg aspirin and an occasional Aleve  for a headache along with his Coumadin.  The patient reports he has  never had an endoscopy or colonoscopy despite the fact that his father  passed with colon cancer.   PAST MEDICAL HISTORY:  Significant for atrial fibrillation, diabetes  with neuropathy, hypertension, hyperlipidemia.  He has a history of  proctitis.  He has a history of transitional cell carcinoma of the  bladder and he is status post resection.  He is status post  cholecystectomy and left foot fracture repair.   CURRENT MEDICATIONS:  Aspirin, Coumadin, and the rest are per chart,  including Norvasc, glipizide, lisinopril, metformin, simvastatin.   ALLERGIES:  He has no known drug allergies.   REVIEW OF SYSTEMS:  Negative except per HPI.   SOCIAL HISTORY:  He quit smoking, but he had a 40 pack year tobacco  history.  He lives with his girlfriend.   FAMILY HISTORY:  Positive for colon cancer.  His father who died  in his  39s.   PHYSICAL EXAMINATION:  GENERAL:  He is alert and oriented, pleasant to  speak with, appears somewhat weak.  HEART :  Regular rate and rhythm.  LUNGS:  Clear to auscultation.  ABDOMEN:  Soft, nontender, nondistended with good bowel sounds.  He was  found to be guaiac positive in the emergency department.   LABORATORY DATA:  On Admission; he was admitted with a hemoglobin of  7.4.  He has had 2 units of packed red blood cells and 2 units of FFP  and now his hemoglobin is 10.4, hematocrit 29.4, white count 11.3,  platelets 202,000.  On admission, he had an MCV value of 92.8.  BUN was  30, is now down to 29.  His creatinine is 0.9, glucose is 116.  On  admission, his INR was 3.  His PT is 33.2.   ASSESSMENT:  Dr. Molly Maduro Buccini has seen and examined the patient,  collected a history and  reviewed his chart.  His impression is this is a  77 year old male on Coumadin and aspirin therapy with a gastrointestinal  bleed likely  upper in nature.  However, given the fact that his father passed with  colon cancer and the patient has not had a colonoscopy, we will proceed  with both an endoscopy and a colonoscopy tomorrow November 04, 2008.  Thanks very much for this consultation.      Stephani Police, PA    ______________________________  Bernette Redbird, M.D.    MLY/MEDQ  D:  11/03/2008  T:  11/04/2008  Job:  [id number]   cc:   Bernette Redbird, M.D.

## 2011-02-05 NOTE — Op Note (Signed)
[name], [name]             ACCOUNT NO.:  [id number]   MEDICAL RECORD NO.:  [id number]          PATIENT TYPE:  INP   LOCATION:  3709                         FACILITY:  [facility]   PHYSICIAN:  Bernette Redbird, M.D.   DATE OF BIRTH:  [birth year]   DATE OF PROCEDURE:  11/04/2008  DATE OF DISCHARGE:                               OPERATIVE REPORT   PROCEDURE:  Upper endoscopy with biopsies.   INDICATIONS:  A 77 year old gentleman with melena, syncope, and drop in  hemoglobin, on aspirin and Coumadin.  Remote history of peptic ulcer  disease.   FINDINGS:  Superficial stellate duodenal ulcerations, consistent with  recent ulcer in healing phase.   PROCEDURE:  The nature, purpose, and risks of the procedure had been  discussed with the patient who provided written consent and was brought  from his hospital room to the Endoscopy Unit.  Sedation for this  procedure was fentanyl 75 mcg and Versed 7.5 mg IV without arrhythmias,  desaturation, or clinical instability.  The Pentax video endoscope was  passed under direct vision.  The larynx was unremarkable despite a heavy  smoking history.  The esophagus was entered without significant  difficulty and was essentially normal throughout its entire length,  without any evidence of Mallory-Weiss tear, hiatal hernia, varices,  infection, neoplasia, or esophagitis.  A couple of tiny Barrett's-  appearing islands were noted in the distal esophagus, but were not  biopsied.   The stomach was entered.  It contained a small bilious residual without  blood or coffee-ground material.  The stomach was normal, without  evidence of gastritis, erosions, ulcers, polyps, or masses and a  retroflexed view of the cardia was unremarkable.   The pylorus was normal.  The duodenal bulb had a very superficial,  nearly healed stellate ulceration.  There was no stigma of hemorrhage.  It did appear that this was the residual of recent ulcer rather than  just a scar  from a previous ulcer.  The second duodenum looked normal.   Prior to removal of the scope, I obtained antral biopsies to sent for  pathology to see if H. pylori infection is present.  The scope was then  removed and the patient tolerated the procedure well without apparent  complication.   IMPRESSION:  1. No active bleeding at the time of this examination.  2. Probable resolving duodenal bulbar ulceration as described above.   PLAN:  1. Continue anti-peptic therapy.  2. Await pathology results with consideration for therapy for H.      pylori if he is positive for that organism on his      biopsy.  3. We will be proceeding with colonoscopic evaluation because of      family history of colon cancer and a recent history of some      intermittent small volume hematochezia before he had the melenic      stools.           ______________________________  Bernette Redbird, M.D.     RB/MEDQ  D:  11/04/2008  T:  11/05/2008  Job:  [id number]   cc:  [facility]

## 2011-02-05 NOTE — Consult Note (Signed)
[name], [name]             ACCOUNT NO.:  [id number]   MEDICAL RECORD NO.:  [id number]          PATIENT TYPE:  INP   LOCATION:  4703                         FACILITY:  [facility]   PHYSICIAN:  Noralyn Pick. Eden Emms, MD, FACCDATE OF BIRTH:  10/02/1927   DATE OF CONSULTATION:  01/24/2009  DATE OF DISCHARGE:                                 CONSULTATION   PRIMARY CARDIOLOGIST:  Luis Abed, MD, [facility]   PRIMARY CARE PHYSICIAN:  Dr. Isabel Caprice at [facility].   REQUESTING PHYSICIAN:  [facility].   REASON FOR CONSULTATION:  Atrial fibrillation with slow ventricular  response and pauses.   HISTORY OF PRESENT ILLNESS:  This is a 77 year old Caucasian male with  known history of atrial fibrillation on chronic Coumadin with  hypertension and diabetes, who was admitted from [facility] ER secondary  to GI bleed.  The patient had an EGD without finding active source of  bleeding, it did show a well-healed duodenal ulcer.  During the evening  hours of Jan 22, 2009, and also Jan 23, 2009, the patient had a  bradycardic or atrial fib with slow ventricular response rhythm in the  30s.  He also had some pauses which were noted ranging from 2.19-2.6  with the greatest of 3.57.  We are asked to evaluate further.   The patient was seen by Dr. Myrtis Ser in February 2010 for bradycardia, slow  ventricular response, and pauses at that time.  The patient was  asymptomatic.  It was only occurring at nighttime, and Dr. Myrtis Ser felt  that no further cardiac workup was recommended.  The patient was seen in  the office by Dr. Myrtis Ser postdischarge and no medications were changed.  During this hospitalization, the primary care has discontinued Norvasc  and Coumadin.   The patient has been asymptomatic.  He has no idea when his heart rate  slows.  His main complaint is with bloody stools and feeling weak.  He  denies syncope, chest pain, or dyspnea on exertion.  His wife does admit  to him  stopping breathing on occasion while he sleeps.   REVIEW OF SYSTEMS:  Positive for generalized weakness, melena.  He  denies chest pain, shortness of breath, or near syncope.  All other  systems reviewed and are found to be negative.   PAST MEDICAL HISTORY:  1. Permanent atrial fibrillation on Coumadin next hypertension.  2. Diabetes.  3. Prostatitis.  4. Hyperlipidemia.  5. Neuropathy.  6. Bladder cancer.   PAST SURGICAL HISTORY:  TURBT in 2006 secondary to bladder cancer,  cholecystectomy, and left foot fracture repair.   PAST CARDIAC WORKUP:  He had an echocardiogram in February 2010  revealing a normal LVEF of 55% with no wall motion abnormalities.   SOCIAL HISTORY:  He lives in [place] with his wife.  He is retired  from [employer].  He is married with 5 children.  He is a 50-pack-year  smoker but quit for 20 years, and he quit alcohol 20 years ago.  No  illicit drug use.   FAMILY HISTORY:  Mother deceased from old age in  her 90s.  Father  deceased from colon cancer.  He has no siblings with heart disease.   CURRENT MEDICATIONS:  1. Insulin per sliding scale.  2. Protonix 40 mg daily.  3. Simvastatin 40 mg daily.  4. Lisinopril 20 mg daily.  5. Metformin 500 mg daily.  6. Glipizide one-half tablet 5 mg daily.  7. Coumadin on hold.  8. Norvasc 5 mg which is on hold.   ALLERGIES:  No known drug allergies.   CURRENT LABORATORY DATA:  Hemoglobin 11.9, hematocrit 35.0, white blood  cells 8.7, platelets 199.  Sodium 139, potassium 4.3, chloride 104, CO2  29, BUN 18, creatinine 0.98, glucose 136.  TSH 1.352, hemoglobin A1c  6.1, magnesium 2.2, phosphorus 3.3.  Chest x-ray revealing cardiomegaly  with COPD without acute cardiopulmonary disease.  EKG reveals atrial  fibrillation with ventricular rate of 69 beats per minute.   PHYSICAL EXAMINATION:  VITAL SIGNS:  Blood pressure 141/95, pulse 84,  respirations 18, temperature 98.3, O2 sat 93% on room air.  GENERAL:   He is awake, alert, oriented.  No acute distress.  Affect is  normal.  HEENT:  Head is normocephalic and atraumatic.  Eyes, PERRLA.  Mucous  membranes in mouth are pink and moist.  Tongue is midline.  NECK:  Supple.  There is no JVD.  No carotid bruits appreciated.  CARDIOVASCULAR:  Irregular rhythm with 1/6 systolic murmur.  Pulses are  2+ and equal without bruits.  LUNGS:  Clear to auscultation without wheezes, rales, or rhonchi.  SKIN:  Warm and dry.  ABDOMEN:  Soft and tender.  Bowel sounds 2+.  EXTREMITIES:  Without clubbing, cyanosis or edema.  NEUROLOGIC:  Cranial nerves II through XII are grossly intact.   IMPRESSION:  1. Atrial fibrillation with slow ventricular response with pauses,      greatest being 3.57.  2. Hypertension.  3. Diabetes.   PLAN:  This is a 77 year old obese Caucasian male with history of  permanent atrial fibrillation on Coumadin.  He was admitted with  gastrointestinal bleed.  He underwent an EGD revealing no active  bleeding but a well-healed duodenal ulcer.  The patient had slow AFib  with pauses during evening hours and in the middle of the night.  He is  asymptomatic.  The patient is on no blocking agents at present.  The  patient's primary care physician hold Norvasc and Coumadin.  His wife  also describes some sleep apnea symptoms.   The patient has been seen and examined by myself and Dr. Charlton Haws.  We will have EP see the patient since we have been asked about this  patient's pauses for a second time this year.  We would restart Coumadin  when okay with GI.  At this time, we will continue other medications as  directed.  We will make further recommendations depending upon EP  evaluation.      Bettey Mare. Lyman Bishop, NP      Noralyn Pick. Eden Emms, MD, [facility]  Electronically Signed    KML/MEDQ  D:  01/24/2009  T:  01/25/2009  Job:  [id number]   cc:   Dr. Isabel Caprice.

## 2011-02-05 NOTE — Op Note (Signed)
[name], [name]             ACCOUNT NO.:  [id number]   MEDICAL RECORD NO.:  [id number]          PATIENT TYPE:  INP   LOCATION:  3709                         FACILITY:  [facility]   PHYSICIAN:  Bernette Redbird, M.D.   DATE OF BIRTH:  31-Oct-1927   DATE OF PROCEDURE:  11/04/2008  DATE OF DISCHARGE:                               OPERATIVE REPORT   PROCEDURE:  Colonoscopy with biopsy.   INDICATIONS:  A 77 year old gentleman with family history of colon  cancer and history of intermittent small volume hematochezia.   FINDINGS:  Two diminutive polyps.   PROCEDURE:  The nature, purpose, and risks of the procedure had been  discussed with the patient who provided written consent.  Sedation for  this procedure and the upper endoscopy, which preceded it totaled  fentanyl 100 mcg and Versed 10 mg IV without arrhythmias or  desaturation.   The Pentax adult video colonoscope was advanced with some looping around  the colon to the terminal ileum, which had normal appearance and  pullback was then performed.  The quality of the prep was excellent and  it was felt that all areas were well seen.  On the way in, I encountered  a 3-4 mm sessile polyp in the rectum, which was cold biopsied and a  diminutive polyp in the transverse colon, which was also cold biopsied.  No large polyps, cancer, colitis, vascular malformations, or  diverticular disease were noted and retroflexion in the rectum was  unremarkable.  The patient tolerated the procedure well and there were  no apparent complications.   IMPRESSION:  1. Diminutive polyps, cold biopsied to excision as described above.  2. Family history of colon cancer without worrisome findings on      current examination.  3. No definite source of intermittent hematochezia seen.  It is felt      most likely to be of hemorrhoidal origin based on the absence of      findings on the current examination.   PLAN:  1. Await pathology on polyps.  2. In view  of the patient's advanced age and medical comorbidities,      regardless of the polyp pathology and despite his family history of      colon cancer, I do not think that routine surveillance      colonoscopies in the future will be needed.           ______________________________  Bernette Redbird, M.D.     RB/MEDQ  D:  11/04/2008  T:  11/05/2008  Job:  [id number]   cc:   [facility]

## 2011-02-05 NOTE — Consult Note (Signed)
NAME:  [name], [name] NO.:  [id number]   MEDICAL RECORD NO.:  [id number]          PATIENT TYPE:  INP   LOCATION:  4703                         FACILITY:  [facility]   PHYSICIAN:  Graylin Shiver, M.D.   DATE OF BIRTH:  07-May-1928   DATE OF CONSULTATION:  01/22/2009  DATE OF DISCHARGE:                                 CONSULTATION   REASON FOR CONSULTATION:  The patient is a 77 year old male with a  history of a duodenal ulcer diagnosed in February 2010 by Dr. Matthias Hughs on  endoscopy.  He also had a colonoscopy at that time, which showed  diminutive polyps, one hyperplastic, one adenomatous.  The patient  presented to the hospital and was admitted because of melena and some  redness in the stool.  The melena began about 2 a.m. today.  He has been  taking omeprazole 20 mg daily since his discharge from the hospital.  He  was H. pylori negative on biopsy.  The patient has a history of atrial  fibrillation and is on Coumadin.  His INR today is 2.4.   PAST MEDICAL HISTORY:  1. Atrial fibrillation.  2. Diabetes with neuropathy.  3. Hypertension.  4. Hyperlipidemia.  5. History of proctitis.  6. History of transitional cell carcinoma of the bladder, status post      resection.  7. History of cholecystectomy in the past.   SOCIAL HISTORY:  He does not smoke.   SYSTEMS REVIEW:  He is not complaining of any chest pain or shortness of  breath.   ALLERGIES:  None known.   PHYSICAL EXAMINATION:  GENERAL:  He is in no distress.  HEENT:  Nonicteric.  HEART:  Regular rhythm.  No murmurs.  LUNGS:  Clear.  ABDOMEN:  Soft and nontender.  No hepatosplenomegaly.   Hemoglobin and hematocrit 12.9 an 39.3 respectively.   IMPRESSION:  1. Mild upper gastrointestinal bleed characterized by melena.  2. History of a duodenal ulcer.  3. History of colon polyps, which were diminutive in nature on      colonoscopy in February 2010.  4. History of atrial fibrillation, on Coumadin.  5.  Other medical problems as noted above.   PLAN:  The patient is admitted to the hospital where he is being  observed.  He will be on a proton pump inhibitor.  We will proceed with  diagnostic EGD tomorrow to see whether he has a nonhealing ulcer or  recurrence of another ulcer or other problem which might explain the new  problem.           ______________________________  Graylin Shiver, M.D.     SFG/MEDQ  D:  01/22/2009  T:  01/23/2009  Job:  [id number]   cc:   [facility]  Bernette Redbird, M.D.

## 2011-02-05 NOTE — H&P (Signed)
NAMEKYL, GIVLER             ACCOUNT NO.:  [id number]   MEDICAL RECORD NO.:  [id number]          PATIENT TYPE:  INP   LOCATION:                               FACILITY:  [facility]   PHYSICIAN:  Massie Maroon, MD        DATE OF BIRTH:  02-23-1928   DATE OF ADMISSION:  11/02/2008  DATE OF DISCHARGE:                              HISTORY & PHYSICAL   ADDENDUM   ALLERGIES:  No known drug allergies.   HOME MEDICATIONS:  1. Amlodipine 5 mg p.o. daily.  2. Enteric-coated aspirin 81 mg p.o. daily.  3. Glipizide 5 mg p.o. daily.  4. Lisinopril 20 mg p.o. daily.  5. Metformin 500 mg p.o. daily.  6. Simvastatin 40 mg p.o. nightly.  7. Coumadin 5 mg p.o. daily, Monday through Friday, and 1.5 mg p.o.      daily Saturday and Sunday.  8. PreserVision.      Massie Maroon, MD  Electronically Signed     JYK/MEDQ  D:  11/02/2008  T:  11/03/2008  Job:  [id number]

## 2011-02-05 NOTE — Consult Note (Signed)
NAMEMAYS, PAINO             ACCOUNT NO.:  [id number]   MEDICAL RECORD NO.:  [id number]          PATIENT TYPE:  INP   LOCATION:  4703                         FACILITY:  [facility]   PHYSICIAN:  Hillis Range, MD       DATE OF BIRTH:  08-23-1928   DATE OF CONSULTATION:  DATE OF DISCHARGE:  01/25/2009                                 CONSULTATION   REQUESTING PHYSICIAN:  Noralyn Pick. Eden Emms, MD, [facility]   REASON FOR CONSULTATION:  Bradycardia.   HISTORY OF PRESENT ILLNESS:  Mr. Brayboy is a pleasant 77 year old  gentleman with a history of permanent atrial fibrillation, diabetes and  recent GI bleeding who was admitted with GI bleeding.  During his  hospital stay, he has been observed to have asymptomatic bradycardia  with pauses.  These pauses typically occur at night and range from 2.1-  2.6 seconds with the greatest pause at midnight at 3.57 seconds.  The  patient denies any presyncope, syncope, fatigue or other symptoms with  these episodes.  In February 2010 the patient was admitted with a  syncopal episode which occurred after drinking a cold drink in the  middle of the night at which time he began gagging.  This episode was  felt to be due to vagal syncope in the setting of a hematocrit of 7.7.  He has had no other episodes of syncope.  He is otherwise without  complaint at this time.  He denies chest pain, shortness of breath,  presyncope, or other concerns.   PAST MEDICAL HISTORY:  1. Permanent atrial fibrillation chronically anticoagulated with      Coumadin.  2. Hypertension.  3. Diabetes.  4. Hyperlipidemia.  5. Prostatitis.  6. Neuropathy.  7. Bladder cancer.  8. Recurrent GI bleeding with chronic anemia.   SURGICAL HISTORY:  1. Status post TURP in 2006 for bladder cancer.  2. Left foot fracture repair.  3. Cholecystectomy.   ALLERGIES:  No known drug allergies.   HOME MEDICATIONS:  1. Amlodipine 5 mg daily.  2. Simvastatin 40 mg at bedtime.  3. Lisinopril 20 mg  daily.  4. Metformin 500 mg daily.  5. Glipizide 2.5 mg daily.  6. Coumadin.  7. Omeprazole 20 mg daily.   SOCIAL HISTORY:  The patient lives in [place] with his spouse.  He  is a retired [employer] investment banker.  He has a 50 pack-year history of  tobacco but quit 20 years ago.  He has a remote history of alcohol use.   FAMILY HISTORY:  Notable for colon cancer.   REVIEW OF SYSTEMS:  All systems are reviewed and negative except as  outlined in the HPI above.   PHYSICAL EXAMINATION:  Telemetry reveals atrial fibrillation with  average ventricular rates of 50-90 beats per minute, several nocturnal  pauses as described in the HPI were observed.  VITALS:  Blood pressure 140/93, heart rate 56, respirations 18, sats 96%  on room air, afebrile gentleman.  GENERAL:  The patient is a well-appearing male in no acute distress.  He  is alert and oriented x3.  HEENT:  Normocephalic, atraumatic.  Sclerae clear.  Conjunctivae pale.  Oropharynx clear.  NECK:  Supple.  No thyromegaly, JVD or bruits.  LUNGS:  Clear to auscultation bilaterally.  HEART:  Irregularly irregular rhythm.  No murmurs, rubs or gallops.  GI:  Soft, nontender, nondistended.  Positive bowel sounds.  EXTREMITIES:  No clubbing, cyanosis or edema.  NEUROLOGIC:  Cranial nerves II-XII are intact.  Strength and sensation  are intact.  SKIN:  No ecchymoses or lacerations.  MUSCULOSKELETAL:  No deformity or atrophy.  PSYCH:  Euthymic mood.  Full affect.   LABORATORY DATA:  Potassium is 4.3, creatinine 0.9, hematocrit 35,  platelets 199.  TSH 1.35.   EKG from Jan 22, 2009 reveals atrial fibrillation and is otherwise  unremarkable.   IMPRESSION:  Mr. Attia is a pleasant 77 year old gentleman with  permanent atrial fibrillation who was admitted with GI bleeding and  observed to have asymptomatic nocturnal pauses on telemetry.  As the  patient has had no symptomatic pauses or bradycardias, I do not feel  that a pacemaker is  warranted at this time.  Should the patient develop  symptomatic bradycardia in the future, we could consider pacing at that  time.  I do not feel that any further electrophysiologic workup or  evaluations are necessary presently, however.      Hillis Range, MD  Electronically Signed     JA/MEDQ  D:  01/25/2009  T:  01/25/2009  Job:  [id number]   cc:   Noralyn Pick. Eden Emms, MD, [facility]

## 2011-02-05 NOTE — H&P (Signed)
NAMESEANN, [name]             ACCOUNT NO.:  [id number]   MEDICAL RECORD NO.:  [id number]          PATIENT TYPE:  INP   LOCATION:  4703                         FACILITY:  [facility]   PHYSICIAN:  Manus Gunning, MD      DATE OF BIRTH:  08-08-1928   DATE OF ADMISSION:  01/22/2009  DATE OF DISCHARGE:                              HISTORY & PHYSICAL   CHIEF COMPLAINT:  Bright red blood with dark black stools x2 bowel  movements.   HISTORY OF PRESENT ILLNESS:  [name] is a 77 year old Caucasian  male who presented to the emergency department at [facility] with above-  noted chief complaint.  Of note, he had had an EGD/colonoscopy performed  on Feb 2010 which demonstrated resolving duodenal bulbar ulceration and  he had been prescribed PPIs and told that if he should bleed again to  present back to the emergency department.  At the time of initial  evaluation, apart from his complaints of fatigue, he had no other  complaint.  Fecal occult blood test in the emergency department was  positive and also his hemoglobin measured demonstrated a hemoglobin of  12.9, hematocrit of 39.3 which is improvement from February 12, at which  time they were 11 and 31.4 respectively.  The patient has had no  additional bowel movements since then and claims that he feels fine.  He  has not received any blood transfusions at the outside hospital as well.  Of note, his INR at the time of presentation was 2.4 and he is on  Coumadin of a history of chronic atrial fibrillation which is currently  rate controlled.   The patient denies fever, denies headache, denies loss of consciousness.  Denies syncope, presyncope, tinnitus, odynophagia or dysphagia.  No  recent travel history.  No sick contact.  Positive fatigue described as  not feeling well x1 day.  Denies chest pain, palpitations, PND or  orthopnea.  Denies cough, shortness of breath or expectoration.  Has no  abdominal discomfort, thought on palpation  has epigastric discomfort.  Positive bright red blood per rectum.  Positive melenic stools.  No  diarrhea or constipation.  No dysuria, polyuria or hematuria.  No  musculoskeletal complaints.  No focal neurological deficits.   PAST MEDICAL/SURGICAL HISTORY:  1. Atrial fibrillation.  2. Hypertension.  3. DM2.  4. Proctitis.   SOCIAL HISTORY:  Quit tobacco 20 years ago.  Has a 50 pack year  preceding history.  Quit alcohol 20 years ago, was a moderate drinker  before.  No history of DTs or withdrawal.  No illicits.   FAMILY HISTORY:  The patient cannot recall.   ALLERGIES:  The patient has No known drug allergies.   CURRENT HOME MEDICATIONS:  The patient is not totally aware.  She claims  that she takes around four medications.  Those noted are:  1. Amlodipine 5 mg p.o. daily.  2. __________ 20 mg p.o. daily.  3. Simvastatin 40 mg p.o. q.h.s.  4. Lisinopril 20 mg q.d.Marland Kitchen2  5. Metformin 500 mg 1 tablet p.o. daily.  6. Glipizide 5 mg 1/2 tablet p.o. daily.  7. Warfarin 5 mg on Tuesday, Wednesday, Thursday, Saturday and Sunday      and 7.5 mg on Monday and Friday.   REVIEW OF SYSTEMS:  A 14-point review of systems performed , pertinent  positives and negatives as described above.   PHYSICAL EXAMINATION:  VITAL SIGNS:  At time of presentation,  temperature 98.4, heart rate 94, respiratory rate 20, blood pressure  137/81, O2 saturation 95% on room air.  EKG demonstrates atrial  fibrillation and rate of 75 beats/min.  GENERAL:  Well-nourished, well-developed Caucasian male lying in bed  comfortably.  No apparent distress.  HEENT:  Normocephalic, atraumatic.  Moist oral mucosa.  No thrush,  erythema or post nasal drip.  Eyes anicteric.  Extraocular muscles  intact.  Pupils equal and reactive to light, accommodation.  CARDIOVASCULAR:  S1, S2, normal.  Regular rate, rhythm.  No murmurs,  rubs or gallops.  RS:  Air entry bilaterally equal.  No rales, rhonchi or wheezes  appreciated.   ABDOMEN:  Soft, distended.  Positive epigastric tenderness, minimal.  No  Cullen's or Turner's sign.  No flank discoloration.  Positive bowel  sounds.  No organomegaly.  EXTREMITIES:  No cyanosis, no clubbing or edema.  Positive bilateral  dorsalis pedis..  CNS:  Alert and oriented x3.  Cranial nerves II-XII grossly intact.  Power, sensation and reflexes bilaterally symmetrical.  SKIN:  No breakdown, swelling, ulcerations or masses.  HEME/ONC:  No palpable lymphadenopathy, ecchymosis, bruising or  petechiae.  NECK:  Supple, good range of motion.  No thyromegaly.  No carotid  bruits.  Neck veins appear normal.   LABORATORY TESTS:  Pro time 28.1, INR 2.4.  WBC count 12,000, hemoglobin  12.9, hematocrit 39.3, platelet count 221, polymorphs 78.  Fecal occult  blood positive.  BNP 160.  Sodium 144, potassium 4.3, chloride 105, CO2  28, glucose 134, BUN 16, creatinine 0.9.  Total bili 0.9, alkaline  phosphatase 110, AST 22, ALT 11, total protein 8.3, albumin 4.4.  Calcium 9.3.  activated PTT 42.   ASSESSMENT AND PLAN:  1. Gastrointestinal bleed.  I believe this most likely is secondary to      his duodenal bulbar ulcer which was present during his      esophagogastroduodenoscopy on November 04, 2008, but was believed      to be healing at that time.  Of concern is the fact that his INR is      2.4 secondary to an appropriate anticoagulation for atrial      fibrillation.  Regardless, he has received 4 mg of vitamin K in the      emergency department.  We will continue to monitor his INR on a      daily basis and at this time check hemoglobins and hematocrits q.6      h.  If hemoglobin is to drop less than 10 g% he is to receive 2      units of packed red blood cells.  I have spoken to      gastroenterology; they will be happy to see the patient in      consultation.  At this time he is to be started on Protonix 40 mg      IV twice daily.  Hold all antihypertensive medication and provide       hydralazine 10 mg IV q.6 h. p.r.n. for a systolic blood pressures      greater than 160.  I would like to prevent hypotension in case the  patient has a massive gastrointestinal bleed and provide as much      blood pressure as can be provided.  2. Hypertension, currently grade 1.  As dictated above, will hold all      antihypertensive medicines, except hydralazine which will be a      p.r.n. for systolic blood pressures greater than 160.  In light of      the fact that if patient has a massive gastrointestinal bleed I      would like to prevent hypotension.  3. Diabetes mellitus type 2.  Again, patient n.p.o. at this time.      Hold glipizide as well as metformin.  Place on      sliding scale insulin protocol.  Check hemoglobin A1c and fasting      lipid profile.  4. Gastrointestinal and deep vein thrombosis prophylaxis.  Place on      sequential compressive devices and for gastrointestinal prophylaxis      Protonix 40 mg IV b.i.d.      Manus Gunning, MD  Electronically Signed     SP/MEDQ  D:  01/22/2009  T:  01/22/2009  Job:  [id number]

## 2011-02-05 NOTE — Discharge Summary (Signed)
[name], [name]             ACCOUNT NO.:  [id number]   MEDICAL RECORD NO.:  [id number]          PATIENT TYPE:  INP   LOCATION:  4703                         FACILITY:  [facility]   PHYSICIAN:  Ruthy Dick, MD    DATE OF BIRTH:  1927/11/04   DATE OF ADMISSION:  01/22/2009  DATE OF DISCHARGE:  01/25/2009                               DISCHARGE SUMMARY   REASON FOR ADMISSION:  GI bleed.   FINAL DISCHARGE DIAGNOSES:  1. Gastrointestinal bleed, source not very clear, negative EGD and      recent negative colonoscopy.  2. History of duodenal ulcer now healed according to recent EGD.  3. Atrial fibrillation on Coumadin.  4. Hypertension.  5. Dyslipidemia.  6. History of transitional cell cancer of the bladder.  7. History of proctitis.  8. Diabetes mellitus with neuropathy.  9. Asymptomatic bradycardia with pauses.   CONSULTS DURING THIS ADMISSION:  1. GI consult.  2. Cardiology consult.  3. Electrophysiology consult.   PROCEDURE DONE DURING THIS ADMISSION:  EGD which showed no source of  bleeding.  According to Dr. Matthias Hughs, there was no need at this time to  do another colonoscopy since the patient had a recent one in February,  but if for any reason the patient continues to bleed then a repeat  colonoscopy may be warranted.  He recommends that the patient follow  with primary care physician for repeat stool guaiac and CBC.   HISTORY OF PRESENT ILLNESS AND HOSPITAL COURSE:  This is a 77 year old  Caucasian male with past medical history significant for atrial  fibrillation, diabetes, hypertension, dyslipidemia, proctitis, who came  into the hospital with GI bleed.  It was suspected that it could have  been  in etiology but the EGD showed healed ulcer.  As noted above,  there was suspicion that this could still be a small intestinal bleed or  diverticular bleed.  Even though colonoscopy was negative  febrile of  this year.  In any case, the patient has not had any more black  stools  and has done well since this admission.  He has no complaints whatsoever  today, no chest pain, no shortness or breath, no abdominal pain, no  nausea, no vomiting.  About a couple of nights ago, the patient did have  telemetric findings with pauses and bradycardia, and because of this  Cardiology was consulted.  The patient was evaluated and  electrophysiologist specialist was also consulted.  According to the  electrophysiologist, at this time the patient has been asymptomatic as  far as his bradycardia is concerned and he does not see any reason at  this time to have a pacemaker placed.  I spoke to the patient today  again about his bradycardia, and he said he suddenly does not have any  symptoms, and he suddenly does not want any pacemaker even if he was  being offered this option at this time.   PHYSICAL EXAMINATION:  VITALS:  Today, temperature 98.2, pulse 56,  respiration 18, blood pressure 148/98, and saturating 96% on room air.  CHEST:  Clear to auscultation bilaterally.  ABDOMEN:  Soft, nontender.  EXTREMITIES:  No clubbing.  No cyanosis.  No edema.  CARDIOVASCULAR:  Irregularly regular.  CENTRAL NERVOUS SYSTEM:  Nonfocal.   The patient is to follow up with Dr. Pearson Grippe in 2 weeks.  During this,  the patient is to have his CBC and stool guaiac rechecked, and if there  is any further drop then it will be prudent to refer the patient back to  Dr. Matthias Hughs for probably repeat colonoscopy.  The patient's hemoglobin  as of yesterday was 11.9 and hematocrit was 35.  The patient is also to  follow with Dr. Myrtis Ser, his primary cardiologist, in about 2-3 weeks.  He  is to call for both appointments.  He is to go home on:  1. Norvasc 5 mg p.o. daily.  2. Zocor 40 mg p.o. nightly.  3. Lisinopril 20 mg p.o. daily.  4. Metformin 500 mg p.o. daily.  5. Glipizide 2.5 mg p.o. daily.  6. Warfarin 5 mg p.o. Tuesday, Wednesday, Thursday, Saturday, and      Sunday, and 7.5 mg on Monday  and Friday.  7. Omeprazole 20 mg p.o. daily.   TIME USED FOR DISCHARGE PLANNING:  Greater than 30 minutes.   The patient is also to continue his followup for his Coumadin to be  [facility].      Ruthy Dick, MD  Electronically Signed     GU/MEDQ  D:  01/25/2009  T:  01/26/2009  Job:  [id number]   cc:   Luis Abed, MD, [facility]  Bernette Redbird, M.D.  Massie Maroon, MD

## 2011-02-08 NOTE — Op Note (Signed)
[name], [name]             ACCOUNT NO.:  [id number]   MEDICAL RECORD NO.:  [id number]          PATIENT TYPE:  AMB   LOCATION:  DAY                          FACILITY:  [facility]   PHYSICIAN:  Valetta Fuller, M.D.  DATE OF BIRTH:  10-23-1927   DATE OF PROCEDURE:  06/10/2005  DATE OF DISCHARGE:                                 OPERATIVE REPORT   PREOPERATIVE DIAGNOSES:  1.  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  2.  Bladder neck obstruction and benign prostatic hypertrophy.   POSTOPERATIVE DIAGNOSIS:  1.  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  2.  Bladder neck obstruction and benign prostatic hypertrophy.   PROCEDURE PERFORMED:  TURBT with fulguration and TUIP.   SURGEON:  Valetta Fuller, M.D.   ANESTHESIA:  General.   INDICATIONS:  [name] is a 77 year old male. He had an episode of gross  painless hematuria. He was diagnosed by the emergency room at [facility] hospital  having a urinary tract infection but his urine only showed blood and culture  was negative. For that reason, he underwent evaluation in our office.  Cytology was negative as was the CT scan. On flexible cystoscopy, the  patient had a very prominent median bar with some trilobar hyperplasia. He  also had a papillary tumor that appeared to be carpeting a small area of his  trigone just above his ureteral orifice on the right. We recommended to the  patient that this area biopsied/resected. We thought that it would be  reasonable to also consider an incision of his bladder neck given the very  high-riding and obstructing bladder neck as well as the voiding symptoms  that the patient has experienced. We also thought it might be easier to  access this area if an incision of the prostate was performed. The patient  appeared to understand this and full informed consent was obtained.   TECHNIQUE AND FINDINGS:  The patient was brought to the operating room where  he had successful  induction of general anesthesia. He was placed in  lithotomy position, prepped and draped in the usual manner. The meatus was  dilated to 30 [place] with [employer] sounds. A 28-French continuous flow  resectoscope sheath was then placed in the bladder. Again the patient did  have significant trilobar hyperplasia with a very prominent median bar.  There was also a question of slightly abnormal mucosa right on the bladder  neck at the 6 o'clock position. Near the right ureteral orifice, there was  an area of what appeared to be fairly obvious papillary transitional cell  carcinoma. I saw no other evidence of tumor within the bladder. On the first  attempt at resecting the tumor on the trigone, there was a little too much  cautery and we got some coagulation artifact. The second swipe, we were able  to get a very nice bit of muscle and the overlying mucosa. At that point,  most of the visible tumor had been cauterized so whether there will be  viable tumor in that section or not will depend on pathology. We also took  four chips out of the bladder neck to open that up and reduce the visible  obstruction and also submit the pathology of the mucosa at the bladder neck  for pathology as well. Hemostasis was excellent. I saw no other pathology. A  22-French Foley catheter with a 30 mL  balloon was inserted at the end of  the procedure and the urine was crystal clear. We will plan on managing Mr.  Maltese as an outpatient with a voiding trial in 48 hours.           ______________________________  Valetta Fuller, M.D.     DSG/MEDQ  D:  06/11/2005  T:  06/11/2005  Job:  [id number]

## 2012-03-19 ENCOUNTER — Other Ambulatory Visit: Payer: Self-pay | Admitting: Internal Medicine

## 2012-03-19 DIAGNOSIS — R42 Dizziness and giddiness: Secondary | ICD-10-CM

## 2012-03-23 ENCOUNTER — Other Ambulatory Visit: Payer: Self-pay

## 2012-03-23 ENCOUNTER — Ambulatory Visit
Admission: RE | Admit: 2012-03-23 | Discharge: 2012-03-23 | Disposition: A | Discharge status: Home / Self Care | Payer: Self-pay | Source: Ambulatory Visit | Attending: Internal Medicine | Admitting: Internal Medicine

## 2012-03-23 DIAGNOSIS — R42 Dizziness and giddiness: Secondary | ICD-10-CM

## 2012-04-06 ENCOUNTER — Other Ambulatory Visit: Payer: Self-pay | Admitting: Internal Medicine

## 2012-04-06 DIAGNOSIS — M549 Dorsalgia, unspecified: Secondary | ICD-10-CM

## 2012-04-09 ENCOUNTER — Ambulatory Visit
Admission: RE | Admit: 2012-04-09 | Discharge: 2012-04-09 | Disposition: A | Discharge status: Home / Self Care | Payer: Medicare Other | Source: Ambulatory Visit | Attending: Internal Medicine | Admitting: Internal Medicine

## 2012-04-09 DIAGNOSIS — M549 Dorsalgia, unspecified: Secondary | ICD-10-CM

## 2012-10-24 HISTORY — PX: FEMORAL ARTERY STENT: SHX1583

## 2012-11-05 ENCOUNTER — Encounter (HOSPITAL_COMMUNITY): Payer: Self-pay | Admitting: General Practice

## 2012-11-05 ENCOUNTER — Observation Stay (HOSPITAL_COMMUNITY)
Admission: RE | Admit: 2012-11-05 | Discharge: 2012-11-06 | DRG: 999 | Disposition: A | Discharge status: Home / Self Care | Payer: Medicare Other | Source: Ambulatory Visit | Attending: Cardiology | Admitting: Cardiology

## 2012-11-05 DIAGNOSIS — E785 Hyperlipidemia, unspecified: Secondary | ICD-10-CM | POA: Insufficient documentation

## 2012-11-05 DIAGNOSIS — I4891 Unspecified atrial fibrillation: Secondary | ICD-10-CM | POA: Insufficient documentation

## 2012-11-05 DIAGNOSIS — I10 Essential (primary) hypertension: Secondary | ICD-10-CM | POA: Insufficient documentation

## 2012-11-05 DIAGNOSIS — I70219 Atherosclerosis of native arteries of extremities with intermittent claudication, unspecified extremity: Secondary | ICD-10-CM | POA: Insufficient documentation

## 2012-11-05 DIAGNOSIS — Z7901 Long term (current) use of anticoagulants: Secondary | ICD-10-CM | POA: Insufficient documentation

## 2012-11-05 DIAGNOSIS — E1159 Type 2 diabetes mellitus with other circulatory complications: Principal | ICD-10-CM | POA: Insufficient documentation

## 2012-11-05 DIAGNOSIS — I739 Peripheral vascular disease, unspecified: Secondary | ICD-10-CM

## 2012-11-05 HISTORY — DX: Essential (primary) hypertension: I10

## 2012-11-05 HISTORY — DX: Transient cerebral ischemic attack, unspecified: G45.9

## 2012-11-05 HISTORY — DX: Unspecified osteoarthritis, unspecified site: M19.90

## 2012-11-05 HISTORY — DX: Cardiac arrhythmia, unspecified: I49.9

## 2012-11-05 HISTORY — DX: Peripheral vascular disease, unspecified: I73.9

## 2012-11-05 HISTORY — DX: Gastro-esophageal reflux disease without esophagitis: K21.9

## 2012-11-05 HISTORY — DX: Shortness of breath: R06.02

## 2012-11-05 LAB — GLUCOSE, CAPILLARY
Glucose-Capillary: 78 mg/dL (ref 70–99)
Glucose-Capillary: 99 mg/dL (ref 70–99)

## 2012-11-05 LAB — POCT I-STAT, CHEM 8
BUN: 20 mg/dL (ref 6–23)
Calcium, Ion: 1.15 mmol/L (ref 1.13–1.30)
Creatinine, Ser: 1.2 mg/dL (ref 0.50–1.35)
TCO2: 26 mmol/L (ref 0–100)

## 2012-11-05 MED ORDER — SODIUM CHLORIDE 0.9 % IJ SOLN
3.0000 mL | Freq: Two times a day (BID) | INTRAMUSCULAR | Status: DC
  Administered 2012-11-05: 3 mL via INTRAVENOUS

## 2012-11-05 MED ORDER — AMLODIPINE BESYLATE 5 MG PO TABS
7.5000 mg | ORAL_TABLET | Freq: Every day | ORAL | Status: DC
  Administered 2012-11-06: 7.5 mg via ORAL
  Filled 2012-11-05 (×2): qty 1

## 2012-11-05 MED ORDER — SODIUM CHLORIDE 0.9 % IV SOLN
INTRAVENOUS | Status: DC
  Administered 2012-11-06: 07:00:00 via INTRAVENOUS

## 2012-11-05 MED ORDER — SIMVASTATIN 40 MG PO TABS
40.0000 mg | ORAL_TABLET | Freq: Every day | ORAL | Status: DC
  Administered 2012-11-05: 40 mg via ORAL
  Filled 2012-11-05 (×2): qty 1

## 2012-11-05 MED ORDER — ENOXAPARIN SODIUM 40 MG/0.4ML ~~LOC~~ SOLN
40.0000 mg | SUBCUTANEOUS | Status: DC
  Administered 2012-11-05: 40 mg via SUBCUTANEOUS
  Filled 2012-11-05 (×2): qty 0.4

## 2012-11-05 MED ORDER — ASPIRIN EC 81 MG PO TBEC
81.0000 mg | DELAYED_RELEASE_TABLET | Freq: Every day | ORAL | Status: DC
  Administered 2012-11-06: 81 mg via ORAL
  Filled 2012-11-05 (×2): qty 1

## 2012-11-05 MED ORDER — LORATADINE 10 MG PO TABS
10.0000 mg | ORAL_TABLET | Freq: Every day | ORAL | Status: DC
  Administered 2012-11-06: 10 mg via ORAL
  Filled 2012-11-05 (×2): qty 1

## 2012-11-05 MED ORDER — SODIUM CHLORIDE 0.9 % IJ SOLN: 3.0000 mL | INTRAMUSCULAR | Status: DC | PRN

## 2012-11-05 MED ORDER — PANTOPRAZOLE SODIUM 40 MG PO TBEC: 40.0000 mg | DELAYED_RELEASE_TABLET | Freq: Every day | ORAL | Status: DC

## 2012-11-05 MED ORDER — SODIUM CHLORIDE 0.9 % IV SOLN
INTRAVENOUS | Status: DC
  Administered 2012-11-05: 06:00:00 via INTRAVENOUS

## 2012-11-05 MED ORDER — IRBESARTAN 75 MG PO TABS
75.0000 mg | ORAL_TABLET | Freq: Every day | ORAL | Status: DC
  Administered 2012-11-06: 75 mg via ORAL
  Filled 2012-11-05 (×2): qty 1

## 2012-11-05 MED ORDER — GLIPIZIDE ER 5 MG PO TB24
5.0000 mg | ORAL_TABLET | Freq: Every day | ORAL | Status: DC
  Filled 2012-11-05 (×3): qty 1

## 2012-11-05 MED ORDER — SODIUM CHLORIDE 0.9 % IV SOLN: 250.0000 mL | INTRAVENOUS | Status: DC | PRN

## 2012-11-05 MED ORDER — TIOTROPIUM BROMIDE MONOHYDRATE 18 MCG IN CAPS
18.0000 ug | ORAL_CAPSULE | Freq: Every day | RESPIRATORY_TRACT | Status: DC
  Filled 2012-11-05: qty 5

## 2012-11-05 MED ORDER — PHYTONADIONE 5 MG PO TABS
2.0000 mg | ORAL_TABLET | Freq: Once | ORAL | Status: DC
  Filled 2012-11-05: qty 1

## 2012-11-05 MED ORDER — SODIUM CHLORIDE 0.9 % IJ SOLN
3.0000 mL | Freq: Two times a day (BID) | INTRAMUSCULAR | Status: DC
  Administered 2012-11-05 (×2): 3 mL via INTRAVENOUS

## 2012-11-05 MED ORDER — INSULIN ASPART 100 UNIT/ML ~~LOC~~ SOLN: 0.0000 [IU] | Freq: Three times a day (TID) | SUBCUTANEOUS | Status: DC

## 2012-11-05 MED ORDER — PHYTONADIONE 1 MG/0.5 ML ORAL SOLUTION
1.0000 mg | Freq: Once | ORAL | Status: AC
  Administered 2012-11-05: 1 mg via ORAL
  Filled 2012-11-05: qty 0.5

## 2012-11-05 NOTE — Progress Notes (Signed)
Utilization review completed.  P.J. Anubis Fundora,RN,BSN Case Manager [phone]  

## 2012-11-05 NOTE — H&P (Signed)
[name] is an 79 y.o. male.   Chief Complaint: Claudication and rest pain right foot.  HPI: The patient is a 79 year old male who presents for a recheck of Atrial fibrillation. Patient is a 79 year old Caucasian male with history of atrial fibrillation is referred to me for evaluation of the same. Patient's wife is present at the bedside and also I had discussed on my last office visit with his primary care physician Dr. Pearson Grippe, obtain records and patient has been on chronic Coumadin therapy for atrial fibrillation and his INR has been therapeutic. No other complaints today except he has noticed worsening pain in his right leg more than the left. He has been waking up at night with pain. There is no ulceration.  There is no prior history of claudication, TIA in the recent past although he has had TIA sometime in 2010. There is no history to suggest GI bleed. No symptoms of recent weight change.  Family History: Father. Deceased. in his 65's from old age (no known heart conditions) Siblings. 7 (all living)-no known heart conditions. Mother. Deceased. at age 53 from old age (no known heart conditions)  Social History: Number of Children. 7. Current tobacco use. Never smoker. quit in 1985 Living Situation. Lives with girlfriend. Marital status. Divorced. Non Drinker/No Alcohol Use   Allergies: No Known Allergies  Medications Prior to Admission  Medication Sig Dispense Refill  . amLODipine (NORVASC) 5 MG tablet Take 7.5 mg by mouth daily.      Marland Kitchen glipiZIDE (GLUCOTROL XL) 5 MG 24 hr tablet Take 5 mg by mouth daily.      Marland Kitchen loratadine (CLARITIN) 10 MG tablet Take 10 mg by mouth daily.      . metFORMIN (GLUCOPHAGE) 1000 MG tablet Take 1,000 mg by mouth 2 (two) times daily with a meal.      . Multiple Vitamins-Minerals (PRESERVISION AREDS PO) Take 1 capsule by mouth daily.      Marland Kitchen olmesartan (BENICAR) 40 MG tablet Take 40 mg by mouth daily.      Marland Kitchen omeprazole (PRILOSEC) 20 MG  capsule Take 20 mg by mouth daily.      . simvastatin (ZOCOR) 40 MG tablet Take 40 mg by mouth every evening.      . tiotropium (SPIRIVA) 18 MCG inhalation capsule Place 18 mcg into inhaler and inhale daily.      Marland Kitchen warfarin (COUMADIN) 5 MG tablet Take 5 mg by mouth daily.          Review of Systems - No bowel or bladder disturbances, Diabetic and blood sugars controlled, no TIA or neurological deficits. On coumadin for A. Fib.   Blood pressure 162/81, pulse 83, temperature 97.7 F (36.5 C), temperature source Oral, resp. rate 18, height 5\' 8"  (1.727 m), weight 79.833 kg (176 lb), SpO2 97.00%.   GENERAL: Well built and normal body habitus, who is in no acute distress. Alert and oriented x 3. Appears stated age.   HEENT: normal limits. PERRLA, No JVD.   CARDIAC EXAM: S1 variable and irregular, S2 normal, No murmur or gallop.   CHEST EXAM: No tenderness of chest wall. LUNGS: Clear to percuss and auscultate. No rales or ronchi.   ABDOMEN: No hepatosplenomegaly. BS normal in all 4 quadrants. Abdomen is non-tender.   EXTREMITY: Full range of movementes, No edema. MUSCULOSKELETAL EXAM: Intact with full range of motion in all 4 extremities.   NEUROLOGIC EXAM: Grossly intact without any focal deficits. Alert O x 3.  VASCULAR EXAM: No skin breakdown. Toes mild bluish discoloration noted. Carotids normal. Extremities: Femoral pulse normal. No bruit. Popliteal pulse, Pedal pulse absent. Otherwise no bruit. Prominent pulse felt in the abdomen. Measures 3 cm. Loud bruit present. No varicose veins.  Results for orders placed during the hospital encounter of 11/05/12 (from the past 48 hour(s))  PROTIME-INR     Status: Abnormal   Collection Time    11/05/12  5:57 AM      Result Value Range   Prothrombin Time 23.7 (*) 11.6 - 15.2 seconds   INR 2.23 (*) 0.00 - 1.49  POCT I-STAT, CHEM 8     Status: Abnormal   Collection Time    11/05/12  6:23 AM      Result Value Range    Sodium 140  135 - 145 mEq/L   Potassium 4.3  3.5 - 5.1 mEq/L   Chloride 106  96 - 112 mEq/L   BUN 20  6 - 23 mg/dL   Creatinine, Ser 8.46  0.50 - 1.35 mg/dL   Glucose, Bld 962 (*) 70 - 99 mg/dL   Calcium, Ion 9.52  8.41 - 1.30 mmol/L   TCO2 26  0 - 100 mmol/L   Hemoglobin 13.3  13.0 - 17.0 g/dL   HCT 32.4  40.1 - 02.7 %   No results found.  Labs:   Lab Results  Component Value Date   WBC 8.7 01/24/2009   HGB 13.3 11/05/2012   HCT 39.0 11/05/2012   MCV 86.3 01/24/2009   PLT 199 01/24/2009    Recent Labs Lab 11/05/12 0623  NA 140  K 4.3  CL 106  BUN 20  CREATININE 1.20  GLUCOSE 111*   Lab Results  Component Value Date   TROPONINI  Value: 0.01        NO INDICATION OF MYOCARDIAL INJURY. 11/03/2008    Lipid Panel     Component Value Date/Time   CHOL  Value: 129        ATP III CLASSIFICATION:  <200     mg/dL   Desirable  253-664  mg/dL   Borderline High  >=403    mg/dL   High        12/28/4257 1739   TRIG 105 01/22/2009 1739   HDL 25* 01/22/2009 1739   CHOLHDL 5.2 01/22/2009 1739   VLDL 21 01/22/2009 1739   LDLCALC  Value: 83        Total Cholesterol/HDL:CHD Risk Coronary Heart Disease Risk Table                     Men   Women  1/2 Average Risk   3.4   3.3  Average Risk       5.0   4.4  2 X Average Risk   9.6   7.1  3 X Average Risk  23.4   11.0        Use the calculated Patient Ratio above and the CHD Risk Table to determine the patient's CHD Risk.        ATP III CLASSIFICATION (LDL):  <100     mg/dL   Optimal  563-875  mg/dL   Near or Above                    Optimal  130-159  mg/dL   Borderline  643-329  mg/dL   High  >518     mg/dL   Very High 04/27/1659 6301  Assessment/Plan  1. Claudication of calf muscles   LE arterial duplex 10/26/12 (done in our office): Significant velocity increase at right SFA-distal suggesting > 50% stenosis. No hemodynamically significant stenosis is identified in the left lower extremity arterial system. ABI could not be calculated due to non-compressible  vessels.  2. Atrial fibrillation. Echo 10/29/12 - Mild conc. LVH,EF-55%, Mild Biatrial enlargement,Trace AR,Mild MR,TR.Trace aortic regurgitation. Mild aortic valve leaflet calcification. Aortic valve leaflet mobility is mildly restricted. Tricuspid valve structurally normal. Mild tricuspid regurgitation. Mild pulmonary hypertension.  Nulcear stress 10/30/12: Resting EKG A. Fibrillation, Poor R wave progression, non specific ST-T change. Stress EKG was non diagnostic for ischemia. Perfusion imaging study demonstrated mild soft tissue attenuation consistent with diaphragmatic attenuation. There was no e/o ischemia or scar. The left ventricular systolic function was 57%. This is a low risk study.  3. Diabetes mellitus with peripheral artery disease.  4. Hyperlipidemia.  Labs done on 09/29/2012 revealed normal urinalysis. CBC, CMP was normal. Hemoglobin A1c was 5.9%. Total cholesterol 96, triglycerides 69, HDL 38, LDL 44. PSA was normal.  Rec: Patient will be admitted due to supratherapeutic INR and needs fairly urgent PV arteriogram due to rest pain. Patient is aware of the risks, benefits of arteriogram and possible angioplasty and is willing to proceed. Patient understands <1-2% risk of death, embolic complications, bleeding, infection, renal failure, urgent surgical revascularization, but not limited to these and wants to proceed.  Pamella Pert, MD 11/05/2012, 8:27 AM [facility] Pager: [phone] Office: [phone] If no answer: Cell:  [phone]

## 2012-11-05 NOTE — Progress Notes (Signed)
INR 2.3; Dr. Jacinto Halim aware; Vitamin K 1 mg. PO given; will recheck INR @ 1230

## 2012-11-06 ENCOUNTER — Encounter (HOSPITAL_COMMUNITY)
Admission: RE | Disposition: A | Discharge status: Home / Self Care | Payer: Self-pay | Source: Ambulatory Visit | Attending: Cardiology

## 2012-11-06 HISTORY — PX: LOWER EXTREMITY ANGIOGRAM: SHX5508

## 2012-11-06 LAB — GLUCOSE, CAPILLARY
Glucose-Capillary: 128 mg/dL — ABNORMAL HIGH (ref 70–99)
Glucose-Capillary: 114 mg/dL — ABNORMAL HIGH (ref 70–99)
Glucose-Capillary: 120 mg/dL — ABNORMAL HIGH (ref 70–99)

## 2012-11-06 SURGERY — ANGIOGRAM, LOWER EXTREMITY
Anesthesia: LOCAL | Laterality: Right

## 2012-11-06 MED ORDER — HEPARIN SODIUM (PORCINE) 1000 UNIT/ML IJ SOLN
INTRAMUSCULAR | Status: AC
  Filled 2012-11-06: qty 1

## 2012-11-06 MED ORDER — ONDANSETRON HCL 4 MG/2ML IJ SOLN: 4.0000 mg | Freq: Four times a day (QID) | INTRAMUSCULAR | Status: DC | PRN

## 2012-11-06 MED ORDER — ASPIRIN 81 MG PO TBEC: 81.0000 mg | DELAYED_RELEASE_TABLET | Freq: Every day | ORAL | Status: DC

## 2012-11-06 MED ORDER — LABETALOL HCL 5 MG/ML IV SOLN
INTRAVENOUS | Status: AC
  Filled 2012-11-06: qty 4

## 2012-11-06 MED ORDER — FENTANYL CITRATE 0.05 MG/ML IJ SOLN
INTRAMUSCULAR | Status: AC
  Filled 2012-11-06: qty 2

## 2012-11-06 MED ORDER — METFORMIN HCL 1000 MG PO TABS: 1000.0000 mg | ORAL_TABLET | Freq: Two times a day (BID) | ORAL | Status: DC

## 2012-11-06 MED ORDER — HEPARIN (PORCINE) IN NACL 2-0.9 UNIT/ML-% IJ SOLN
INTRAMUSCULAR | Status: AC
  Filled 2012-11-06: qty 1000

## 2012-11-06 MED ORDER — ACETAMINOPHEN 325 MG PO TABS: 650.0000 mg | ORAL_TABLET | ORAL | Status: DC | PRN

## 2012-11-06 MED ORDER — LIDOCAINE HCL (PF) 1 % IJ SOLN
INTRAMUSCULAR | Status: AC
  Filled 2012-11-06: qty 30

## 2012-11-06 MED ORDER — MIDAZOLAM HCL 2 MG/2ML IJ SOLN
INTRAMUSCULAR | Status: AC
  Filled 2012-11-06: qty 2

## 2012-11-06 MED ORDER — ENOXAPARIN SODIUM 150 MG/ML ~~LOC~~ SOLN: 1.5000 mg/kg | Freq: Every day | SUBCUTANEOUS | Status: DC

## 2012-11-06 MED ORDER — SODIUM CHLORIDE 0.9 % IV SOLN: 1.0000 mL/kg/h | INTRAVENOUS | Status: DC

## 2012-11-06 NOTE — Discharge Summary (Signed)
Physician Discharge Summary  Patient ID: [name] MRN: [id number] DOB/AGE: 02-27-1928 79 y.o.  Admit date: 11/05/2012 Discharge date: 11/06/2012  Primary Discharge Diagnosis PAD, S/P PTA and stenting of the right SFA with implantation of a 6.0 x 40 mm Smart self-expanding stent  To the right SFA on 11/06/12 Secondary Discharge Diagnosis A. FIbrillation Hypertension Hyperlipidemia  Significant Diagnostic Studies: PV angiogram 11/06/12 and stenting of the right SFA for claudication  Hospital Course: Patient presented to the office with rest pain and bluish discoloration of his toes. He was admitted to the hospital for elective peripheral angiography and possible angioplasty. However on initial admission to the hospital, his INR was supratherapeutic. Hence patient had to be admitted to the hospital to reverse anticoagulation. He received oral vitamin K followed by repeat INR testing 5 hours later. His INR was still 1.9. Hence the procedure had to be postponed and patient was kept overnight to monitor his INR and proceed with peripheral angiography accordingly. The following day his INR was 1.4 and hence felt stable for the procedure. The procedure and the stenting went without any adverse events. Hence patient was felt stable for discharge. Patient is willing to take Lovenox in the outpatient basis. He'll be continued on Coumadin, aspirin 81 mg by mouth daily along with starting Lovenox tomorrow for 3 days followed by outpatient PT/INR evaluation at that he [name]'s office.  Discharge Exam: Blood pressure 158/80, pulse 67, temperature 98.6 F (37 C), temperature source Oral, resp. rate 16, height 5\' 8"  (1.727 m), weight 79.833 kg (176 lb), SpO2 95.00%.   GENERAL: Well built and normal body habitus, who is in no acute distress. Alert and oriented x 3. Appears stated age.  HEENT: normal limits. PERRLA, No JVD.  CARDIAC EXAM: S1 variable and irregular, S2 normal, No murmur or gallop.   CHEST EXAM: No tenderness of chest wall. LUNGS: Clear to percuss and auscultate. No rales or ronchi.  ABDOMEN: No hepatosplenomegaly. BS normal in all 4 quadrants. Abdomen is non-tender.  EXTREMITY: Full range of movementes, No edema. MUSCULOSKELETAL EXAM: Intact with full range of motion in all 4 extremities.  NEUROLOGIC EXAM: Grossly intact without any focal deficits. Alert O x 3.  VASCULAR EXAM: No skin breakdown. Right foot was pink at the end of the procedure with palpable DP. Carotids normal. Extremities: Femoral pulse normal. No bruit. Popliteal pulse, Pedal pulse faint right and left Otherwise no bruit. Prominent pulse felt in the abdomen.    Lab Results  Component Value Date   WBC 8.7 01/24/2009   HGB 13.3 11/05/2012   HCT 39.0 11/05/2012   MCV 86.3 01/24/2009   PLT 199 01/24/2009    FOLLOW UP PLANS AND APPOINTMENTS: F/U with me as scheduled. Restart coumadin today and lovenox tomorrow. Continue ASA 81 mg until seen by me. F/U with Dr. Elmyra Ricks office Friday for INR.     Medication List    TAKE these medications       amLODipine 5 MG tablet  Commonly known as:  NORVASC  Take 7.5 mg by mouth daily.     aspirin 81 MG EC tablet  Take 1 tablet (81 mg total) by mouth daily.     enoxaparin 150 MG/ML injection  Commonly known as:  LOVENOX  Inject 0.8 mLs (120 mg total) into the skin daily.     glipiZIDE 5 MG 24 hr tablet  Commonly known as:  GLUCOTROL XL  Take 5 mg by mouth daily.     loratadine 10  MG tablet  Commonly known as:  CLARITIN  Take 10 mg by mouth daily.     metFORMIN 1000 MG tablet  Commonly known as:  GLUCOPHAGE  Take 1 tablet (1,000 mg total) by mouth 2 (two) times daily with a meal.  Start taking on:  11/09/2012     olmesartan 40 MG tablet  Commonly known as:  BENICAR  Take 40 mg by mouth daily.     omeprazole 20 MG capsule  Commonly known as:  PRILOSEC  Take 20 mg by mouth daily.     PRESERVISION AREDS PO  Take 1 capsule by mouth daily.     simvastatin  40 MG tablet  Commonly known as:  ZOCOR  Take 40 mg by mouth every evening.     tiotropium 18 MCG inhalation capsule  Commonly known as:  SPIRIVA  Place 18 mcg into inhaler and inhale daily.     warfarin 5 MG tablet  Commonly known as:  COUMADIN  Take 5 mg by mouth daily.           Follow-up Information   Follow up with Pamella Pert, MD. (Please keep previous appointment)    Contact information:   [address] [place] [address] [phone]        Pamella Pert, MD 11/06/2012, 10:05 AM  Pager: [phone] Office: [phone] If no answer: [phone]

## 2012-11-06 NOTE — CV Procedure (Signed)
Procedures performed:  1. left Femoral Arterial access 2. Abdominal aortogram.  3. Crossover from  left to the right  femoral artery placement of catheter tip in the right  femoral artery.  4. right femoral arteriogram with distal runoff 5. PTA and stenting of the right SFA with 6.0 x 40 mm Smart self expanding stent. 6. left femoral arteriogram with distal runoff 7. left femoral arteriogram and closure of the  femoral arterial access with  Angio-Seal.  Indication: Claudication,  Abnormal LE arterial duplex.   Peripheral arthrogram: No evidence of abdominal aneurysm. 2 renal arteries one on either side and they're widely patent.   Aortoiliac bifurcation was widely patent. There is mild diffuse calcification evident with mild luminal irregularity.  Femoral arteriogram: Right femoral artery: There is moderate to severe diffuse calcification noted in the peripheral vessels. The right SFA had a focal 80% stenosis just at the Hunter's canal outlet. It was mildly calcified. Below the right knee, the anterior tibial artery origin was much higher than usual, and this is diffusely diseased but patent. The peroneal artery was a dominant vessel and is widely patent. The posterior tibial artery is diffusely diseased with multiple tandem 80-90% stenosis. Collateral filling were evident at the level of the ankle. Left femoral artery: The left femoral artery again showed mild to moderate amount of diffuse ossification. There was no high-grade stenosis. Below the left knee there was three-vessel runoff noted again with moderate diffuse disease without any high-grade stenosis.  Interventional data: Successful PTA and stenting of the right SFA with implantation of a 6.0 x 40 mm Smart self-expanding stent which was postdilated with a 5.0 x 60 mm Powerflex balloon at 6 atmospheric pressure. 80% stenosis in the right SFA reduced to 0%.  TECHNIQUE OF THE PROCEDURE: Under sterile precautions using a 5-French left  femoral arterial access, a 5 [place] Omniflush catheter was advanced  into the desscending aorta and arteriogram was performed.   Femoral arterial runoff: Right femoral arterial runoff was performed using the same catheter after crossing over from the left  femoral artery into the right femoral artery with the help of a Versacore. The catheter tip was positioned into the right external iliac artery and right femoral arteriogram was performed followed by proceeding with intervention to the right SFA. At the completion of intervention to the right SFA, the sheath was pulled into the left common femoral artery and left femoral arteriogram with distal runoff was performed through the 6 French sheath.  Technique of intervention: Using heparin for anticoagulation and a 6 French  Terumo 45 cm sheath, sheath was placed into the right femoral artery. Then a long Versacore wire was utilized to cross the stenosis with mild amount of difficulty and the tip of the wire was carefully positioned in the distal SFA. W decided to proceed with implantation of a 6.0 x 40 mm Smart self-expanding stent under fluoroscopic guidance. The stent was post dilated with a 5.0 x 60 mm PowerFlex balloon at 6 atmospheric pressure. Following this angiography was repeated and excellent results were confirmed.  The balloon and the wires were withdrawn out of the body and the sheath was gently pulled into the left femoral artery and left femoral arteriogram was performed to evaluate the arterial access and arterial access was closed with Angio-Seal with excellent hemostasis. Patient tolerated the procedure well. There was no immediate complication.

## 2012-11-06 NOTE — Care Management Note (Unsigned)
    Page 1 of 1   11/06/2012     3:40:17 PM   CARE MANAGEMENT NOTE 11/06/2012  Patient:  [name], [name]   Account Number:  [id number]  Date Initiated:  11/06/2012  Documentation initiated by:  Kellan Boehlke  Subjective/Objective Assessment:   PT S/P STENTING OF RT SFA ON 11/06/12.  PTA, PT INDEPENDENT, LIVES WITH SPOUSE.     Action/Plan:   WILL FOLLOW FOR HOME NEEDS AS PT PROGRESSES.   Anticipated DC Date:  11/06/2012   Anticipated DC Plan:  HOME/SELF CARE      DC Planning Services  CM consult      Choice offered to / List presented to:             Status of service:  In process, will continue to follow Medicare Important Message given?   (If response is "NO", the following Medicare IM given date fields will be blank) Date Medicare IM given:   Date Additional Medicare IM given:    Discharge Disposition:    Per UR Regulation:    If discussed at Long Length of Stay Meetings, dates discussed:    Comments:

## 2012-11-06 NOTE — Progress Notes (Signed)
Reviewed discharge instructions with pt and family, teachback method medication and , followup appointment. Family clear on only instructions, left groin dsg in place dry and intact Egbert Garibaldi A

## 2012-11-06 NOTE — Interval H&P Note (Signed)
History and Physical Interval Note:  11/06/2012 7:54 AM  [name]  has presented today for surgery, with the diagnosis of Claudication  The various methods of treatment have been discussed with the patient and family. After consideration of risks, benefits and other options for treatment, the patient has consented to  Procedure(s): LOWER EXTREMITY ANGIOGRAM (N/A) and possible angioplasty  as a surgical intervention .  The patient's history has been reviewed, patient examined, no change in status, stable for surgery.  I have reviewed the patient's chart and labs.  Questions were answered to the patient's satisfaction.     Pamella Pert

## 2013-05-24 DIAGNOSIS — I639 Cerebral infarction, unspecified: Secondary | ICD-10-CM

## 2013-05-24 HISTORY — DX: Cerebral infarction, unspecified: I63.9

## 2013-07-06 ENCOUNTER — Encounter (HOSPITAL_COMMUNITY): Payer: Self-pay | Admitting: Emergency Medicine

## 2013-07-06 ENCOUNTER — Inpatient Hospital Stay (HOSPITAL_COMMUNITY)
Admission: EM | Admit: 2013-07-06 | Discharge: 2013-07-09 | DRG: 065 | Disposition: A | Discharge status: Skilled Nursing Facility | Payer: Medicare Other | Attending: Family Medicine | Admitting: Family Medicine

## 2013-07-06 ENCOUNTER — Inpatient Hospital Stay (HOSPITAL_COMMUNITY): Payer: Medicare Other

## 2013-07-06 ENCOUNTER — Emergency Department (HOSPITAL_COMMUNITY): Payer: Medicare Other

## 2013-07-06 DIAGNOSIS — G459 Transient cerebral ischemic attack, unspecified: Secondary | ICD-10-CM | POA: Diagnosis present

## 2013-07-06 DIAGNOSIS — E785 Hyperlipidemia, unspecified: Secondary | ICD-10-CM | POA: Diagnosis present

## 2013-07-06 DIAGNOSIS — I639 Cerebral infarction, unspecified: Secondary | ICD-10-CM

## 2013-07-06 DIAGNOSIS — I369 Nonrheumatic tricuspid valve disorder, unspecified: Secondary | ICD-10-CM

## 2013-07-06 DIAGNOSIS — Z8719 Personal history of other diseases of the digestive system: Secondary | ICD-10-CM

## 2013-07-06 DIAGNOSIS — I10 Essential (primary) hypertension: Secondary | ICD-10-CM | POA: Diagnosis present

## 2013-07-06 DIAGNOSIS — Z8673 Personal history of transient ischemic attack (TIA), and cerebral infarction without residual deficits: Secondary | ICD-10-CM | POA: Diagnosis present

## 2013-07-06 DIAGNOSIS — K219 Gastro-esophageal reflux disease without esophagitis: Secondary | ICD-10-CM | POA: Diagnosis present

## 2013-07-06 DIAGNOSIS — E119 Type 2 diabetes mellitus without complications: Secondary | ICD-10-CM | POA: Diagnosis present

## 2013-07-06 DIAGNOSIS — I739 Peripheral vascular disease, unspecified: Secondary | ICD-10-CM | POA: Diagnosis present

## 2013-07-06 DIAGNOSIS — J449 Chronic obstructive pulmonary disease, unspecified: Secondary | ICD-10-CM | POA: Diagnosis present

## 2013-07-06 DIAGNOSIS — J4489 Other specified chronic obstructive pulmonary disease: Secondary | ICD-10-CM | POA: Diagnosis present

## 2013-07-06 DIAGNOSIS — I517 Cardiomegaly: Secondary | ICD-10-CM | POA: Diagnosis present

## 2013-07-06 DIAGNOSIS — I6350 Cerebral infarction due to unspecified occlusion or stenosis of unspecified cerebral artery: Secondary | ICD-10-CM

## 2013-07-06 DIAGNOSIS — Z7901 Long term (current) use of anticoagulants: Secondary | ICD-10-CM

## 2013-07-06 DIAGNOSIS — I509 Heart failure, unspecified: Secondary | ICD-10-CM | POA: Diagnosis present

## 2013-07-06 DIAGNOSIS — H3530 Unspecified macular degeneration: Secondary | ICD-10-CM | POA: Diagnosis present

## 2013-07-06 DIAGNOSIS — E1149 Type 2 diabetes mellitus with other diabetic neurological complication: Secondary | ICD-10-CM | POA: Diagnosis present

## 2013-07-06 DIAGNOSIS — I6340 Cerebral infarction due to embolism of unspecified cerebral artery: Principal | ICD-10-CM | POA: Diagnosis present

## 2013-07-06 DIAGNOSIS — G8190 Hemiplegia, unspecified affecting unspecified side: Secondary | ICD-10-CM | POA: Diagnosis present

## 2013-07-06 DIAGNOSIS — I4891 Unspecified atrial fibrillation: Secondary | ICD-10-CM | POA: Diagnosis present

## 2013-07-06 LAB — BASIC METABOLIC PANEL
CO2: 24 mEq/L (ref 19–32)
Calcium: 9.3 mg/dL (ref 8.4–10.5)
Potassium: 4.1 mEq/L (ref 3.5–5.1)
Sodium: 139 mEq/L (ref 135–145)

## 2013-07-06 LAB — CBC WITH DIFFERENTIAL/PLATELET
Eosinophils Relative: 2 % (ref 0–5)
Lymphocytes Relative: 22 % (ref 12–46)
Lymphs Abs: 1.6 10*3/uL (ref 0.7–4.0)
MCV: 96.5 fL (ref 78.0–100.0)
Neutrophils Relative %: 57 % (ref 43–77)
Platelets: 164 10*3/uL (ref 150–400)
RBC: 3.99 MIL/uL — ABNORMAL LOW (ref 4.22–5.81)
WBC: 7.3 10*3/uL (ref 4.0–10.5)

## 2013-07-06 LAB — URINE MICROSCOPIC-ADD ON

## 2013-07-06 LAB — PROTIME-INR
INR: 2.06 — ABNORMAL HIGH (ref 0.00–1.49)
Prothrombin Time: 22.6 seconds — ABNORMAL HIGH (ref 11.6–15.2)

## 2013-07-06 LAB — GLUCOSE, CAPILLARY: Glucose-Capillary: 73 mg/dL (ref 70–99)

## 2013-07-06 LAB — URINALYSIS, ROUTINE W REFLEX MICROSCOPIC
Nitrite: NEGATIVE
Specific Gravity, Urine: 1.018 (ref 1.005–1.030)
pH: 5 (ref 5.0–8.0)

## 2013-07-06 LAB — TROPONIN I: Troponin I: <0.3 ng/mL (ref <0.30)

## 2013-07-06 MED ORDER — CARVEDILOL 6.25 MG PO TABS
6.2500 mg | ORAL_TABLET | Freq: Two times a day (BID) | ORAL | Status: DC
  Administered 2013-07-07 – 2013-07-08 (×4): 6.25 mg via ORAL
  Filled 2013-07-06 (×10): qty 1

## 2013-07-06 MED ORDER — WARFARIN - PHARMACIST DOSING INPATIENT: Freq: Every day | Status: DC

## 2013-07-06 MED ORDER — WARFARIN SODIUM 5 MG PO TABS: 5.0000 mg | ORAL_TABLET | Freq: Every day | ORAL | Status: DC

## 2013-07-06 MED ORDER — CILOSTAZOL 50 MG PO TABS
50.0000 mg | ORAL_TABLET | Freq: Two times a day (BID) | ORAL | Status: DC
  Administered 2013-07-06 – 2013-07-09 (×6): 50 mg via ORAL
  Filled 2013-07-06 (×8): qty 1

## 2013-07-06 MED ORDER — CLONIDINE HCL 0.1 MG PO TABS
0.2000 mg | ORAL_TABLET | Freq: Once | ORAL | Status: AC
  Administered 2013-07-06: 0.2 mg via ORAL
  Filled 2013-07-06: qty 2

## 2013-07-06 MED ORDER — SIMVASTATIN 40 MG PO TABS
40.0000 mg | ORAL_TABLET | Freq: Every evening | ORAL | Status: DC
  Administered 2013-07-07 – 2013-07-09 (×3): 40 mg via ORAL
  Filled 2013-07-06 (×4): qty 1

## 2013-07-06 MED ORDER — ACETAMINOPHEN 650 MG RE SUPP: 650.0000 mg | RECTAL | Status: DC | PRN

## 2013-07-06 MED ORDER — IRBESARTAN 150 MG PO TABS
150.0000 mg | ORAL_TABLET | Freq: Every day | ORAL | Status: DC
  Administered 2013-07-07 – 2013-07-09 (×3): 150 mg via ORAL
  Filled 2013-07-06 (×4): qty 1

## 2013-07-06 MED ORDER — ACETAMINOPHEN 325 MG PO TABS: 650.0000 mg | ORAL_TABLET | ORAL | Status: DC | PRN

## 2013-07-06 MED ORDER — AMLODIPINE BESYLATE 5 MG PO TABS
7.5000 mg | ORAL_TABLET | Freq: Every day | ORAL | Status: DC
  Administered 2013-07-07 – 2013-07-09 (×3): 7.5 mg via ORAL
  Filled 2013-07-06 (×3): qty 1

## 2013-07-06 MED ORDER — INSULIN ASPART 100 UNIT/ML ~~LOC~~ SOLN
0.0000 [IU] | SUBCUTANEOUS | Status: DC
  Administered 2013-07-07: 1 [IU] via SUBCUTANEOUS

## 2013-07-06 MED ORDER — WARFARIN - PHYSICIAN DOSING INPATIENT: Freq: Every day | Status: DC

## 2013-07-06 MED ORDER — TIOTROPIUM BROMIDE MONOHYDRATE 18 MCG IN CAPS
18.0000 ug | ORAL_CAPSULE | Freq: Every day | RESPIRATORY_TRACT | Status: DC
  Administered 2013-07-08 – 2013-07-09 (×2): 18 ug via RESPIRATORY_TRACT
  Filled 2013-07-06 (×3): qty 5

## 2013-07-06 MED ORDER — ENOXAPARIN SODIUM 40 MG/0.4ML ~~LOC~~ SOLN
40.0000 mg | Freq: Every day | SUBCUTANEOUS | Status: DC
Start: 2013-07-06 — End: 2013-07-06
  Filled 2013-07-06: qty 0.4

## 2013-07-06 MED ORDER — LORATADINE 10 MG PO TABS
10.0000 mg | ORAL_TABLET | Freq: Every day | ORAL | Status: DC
  Administered 2013-07-07 – 2013-07-09 (×3): 10 mg via ORAL
  Filled 2013-07-06 (×4): qty 1

## 2013-07-06 NOTE — ED Notes (Signed)
Patient transported to CT 

## 2013-07-06 NOTE — Consult Note (Signed)
NEURO HOSPITALIST CONSULT NOTE    Reason for Consult: right sided weakness, imbalance.  HPI:                                                                                                                                          [name] is an 79 y.o. male with a past medical history significant for HTN, DM, atrial fibrillation on coumadin,  PVD, TIA, GERD, who was in his usual state of health until this morning when he developed new onset dizziness, imbalance, " tightness of both legs", and speech changes noticed by wife. He said that later on the right arm and leg became weak, " may be around 10 am, but I really don't know". Initially evaluated at [facility] ED. Denies HA, vertigo, double vision, difficulty swallowing, confusion, or visual disturbances.' CT brain showed no acute abnormality. INR 2.06 Past Medical History  Diagnosis Date  . Diabetes mellitus without complication   . Dysrhythmia     atrial fibrilation  . Hypertension   . Shortness of breath   . Peripheral vascular disease   . Claudication of calf muscles 11/05/2012    right calf  . GERD (gastroesophageal reflux disease)   . Arthritis   . TIA (transient ischemic attack) 2010    Past Surgical History  Procedure Laterality Date  . Foot surgery      foot surgery due to broken foot years ago    History reviewed. No pertinent family history.   Social History:  reports that he quit smoking about 31 years ago. His smoking use included Cigarettes. He smoked 0.00 packs per day. He has never used smokeless tobacco. He reports that he does not drink alcohol or use illicit drugs.  No Known Allergies  MEDICATIONS:                                                                                                                     I have reviewed the patient's current medications.   ROS:  History obtained from the patient and chart review  General ROS: negative for - chills, fatigue, fever, night sweats, weight gain or weight loss Psychological ROS: negative for - behavioral disorder, hallucinations, memory difficulties, mood swings or suicidal ideation Ophthalmic ROS: negative for - blurry vision, double vision, eye pain or loss of vision ENT ROS: negative for - epistaxis, nasal discharge, oral lesions, sore throat, tinnitus or vertigo Allergy and Immunology ROS: negative for - hives or itchy/watery eyes Hematological and Lymphatic ROS: negative for - bleeding problems, bruising or swollen lymph nodes Endocrine ROS: negative for - galactorrhea, hair pattern changes, polydipsia/polyuria or temperature intolerance Respiratory ROS: negative for - cough, hemoptysis, shortness of breath or wheezing Cardiovascular ROS: negative for - chest pain, dyspnea on exertion, edema or irregular heartbeat Gastrointestinal ROS: negative for - abdominal pain, diarrhea, hematemesis, nausea/vomiting or stool incontinence Genito-Urinary ROS: negative for - dysuria, hematuria, incontinence or urinary frequency/urgency Musculoskeletal ROS: negative for - joint swelling Neurological ROS: as noted in HPI Dermatological ROS: negative for rash and skin lesion changes   Physical exam: pleasant male in no apparent distress.Blood pressure 159/78, pulse 78, temperature 97.5 F (36.4 C), temperature source Oral, resp. rate 18, weight 78.6 kg (173 lb 4.5 oz), SpO2 99.00%.  Head: normocephalic. Neck: supple, no bruits, no JVD. Cardiac: no murmurs. Lungs: clear. Abdomen: soft, no tender, no mass.   Mental Status: Alert, oriented, thought content appropriate.  Dysarthric..  Able to follow 3 step commands without difficulty. Cranial Nerves: II: Discs flat bilaterally; Visual fields grossly normal, pupils equal, round, reactive to light and accommodation III,IV, VI: ptosis not present,  extra-ocular motions intact bilaterally V,VII: smile symmetric, facial light touch sensation normal bilaterally VIII: hearing normal bilaterally IX,X: gag reflex present XI: bilateral shoulder shrug XII: midline tongue extension Motor: Significant for left HP, arm greater than leg Tone and bulk:normal tone throughout; no atrophy noted Sensory: Pinprick and light touch intact throughout, bilaterally Deep Tendon Reflexes:  1 all over Plantars: Right: downgoing   Left: upgoing Cerebellar: Impaired finger to nose and HKS in the left side. Gait:  No tested. CV: pulses palpable throughout   Extremities: no edema.  Neurologic Examination:                                                                                                        Lab Results  Component Value Date/Time   CHOL  Value: 129        ATP III CLASSIFICATION:  <200     mg/dL   Desirable  161-096  mg/dL   Borderline High  >=045    mg/dL   High        4/0/9811  5:39 PM    Results for orders placed during the hospital encounter of 07/06/13 (from the past 48 hour(s))  GLUCOSE, CAPILLARY     Status: None   Collection Time    07/06/13 12:14 PM      Result Value Range   Glucose-Capillary 73  70 - 99 mg/dL  BASIC METABOLIC PANEL     Status: Abnormal  Collection Time    07/06/13  1:00 PM      Result Value Range   Sodium 139  135 - 145 mEq/L   Potassium 4.1  3.5 - 5.1 mEq/L   Chloride 104  96 - 112 mEq/L   CO2 24  19 - 32 mEq/L   Glucose, Bld 84  70 - 99 mg/dL   BUN 19  6 - 23 mg/dL   Creatinine, Ser 4.25  0.50 - 1.35 mg/dL   Calcium 9.3  8.4 - 95.6 mg/dL   GFR calc non Af Amer 76 (*) >90 mL/min   GFR calc Af Amer 88 (*) >90 mL/min   Comment: (NOTE)     The eGFR has been calculated using the CKD EPI equation.     This calculation has not been validated in all clinical situations.     eGFR's persistently <90 mL/min signify possible Chronic Kidney     Disease.  CBC WITH DIFFERENTIAL     Status: Abnormal    Collection Time    07/06/13  1:00 PM      Result Value Range   WBC 7.3  4.0 - 10.5 K/uL   RBC 3.99 (*) 4.22 - 5.81 MIL/uL   Hemoglobin 13.1  13.0 - 17.0 g/dL   HCT 38.7 (*) 56.4 - 33.2 %   MCV 96.5  78.0 - 100.0 fL   MCH 32.8  26.0 - 34.0 pg   MCHC 34.0  30.0 - 36.0 g/dL   RDW 95.1  88.4 - 16.6 %   Platelets 164  150 - 400 K/uL   Neutrophils Relative % 57  43 - 77 %   Neutro Abs 4.2  1.7 - 7.7 K/uL   Lymphocytes Relative 22  12 - 46 %   Lymphs Abs 1.6  0.7 - 4.0 K/uL   Monocytes Relative 19 (*) 3 - 12 %   Monocytes Absolute 1.4 (*) 0.1 - 1.0 K/uL   Eosinophils Relative 2  0 - 5 %   Eosinophils Absolute 0.1  0.0 - 0.7 K/uL   Basophils Relative 0  0 - 1 %   Basophils Absolute 0.0  0.0 - 0.1 K/uL  TROPONIN I     Status: None   Collection Time    07/06/13  1:00 PM      Result Value Range   Troponin I <0.30  <0.30 ng/mL   Comment:            Due to the release kinetics of cTnI,     a negative result within the first hours     of the onset of symptoms does not rule out     myocardial infarction with certainty.     If myocardial infarction is still suspected,     repeat the test at appropriate intervals.  PROTIME-INR     Status: Abnormal   Collection Time    07/06/13  1:10 PM      Result Value Range   Prothrombin Time 22.6 (*) 11.6 - 15.2 seconds   INR 2.06 (*) 0.00 - 1.49  URINALYSIS, ROUTINE W REFLEX MICROSCOPIC     Status: Abnormal   Collection Time    07/06/13  1:23 PM      Result Value Range   Color, Urine YELLOW  YELLOW   APPearance CLEAR  CLEAR   Specific Gravity, Urine 1.018  1.005 - 1.030   pH 5.0  5.0 - 8.0   Glucose, UA NEGATIVE  NEGATIVE mg/dL   Hgb  urine dipstick NEGATIVE  NEGATIVE   Bilirubin Urine NEGATIVE  NEGATIVE   Ketones, ur NEGATIVE  NEGATIVE mg/dL   Protein, ur NEGATIVE  NEGATIVE mg/dL   Urobilinogen, UA 1.0  0.0 - 1.0 mg/dL   Nitrite NEGATIVE  NEGATIVE   Leukocytes, UA TRACE (*) NEGATIVE  URINE MICROSCOPIC-ADD ON     Status: None   Collection  Time    07/06/13  1:23 PM      Result Value Range   WBC, UA 0-2  <3 WBC/hpf   Bacteria, UA RARE  RARE  TROPONIN I     Status: None   Collection Time    07/06/13  5:10 PM      Result Value Range   Troponin I <0.30  <0.30 ng/mL   Comment:            Due to the release kinetics of cTnI,     a negative result within the first hours     of the onset of symptoms does not rule out     myocardial infarction with certainty.     If myocardial infarction is still suspected,     repeat the test at appropriate intervals.  GLUCOSE, CAPILLARY     Status: None   Collection Time    07/06/13  6:18 PM      Result Value Range   Glucose-Capillary 82  70 - 99 mg/dL    Dg Chest 2 View  13/04/6577   CLINICAL DATA:  Left-sided weakness and shortness of breath. Previous smoker.  EXAM: CHEST  2 VIEW  COMPARISON:  04/01/2012.  FINDINGS: Cardiac enlargement. Patchy lower lobe opacities appear improved mild bibasilar scarring. No infiltrates or failure. Calcified tortuous aorta. Negative osseous structures.  IMPRESSION: Slight improvement in aeration. Chronic bronchitic changes. Cardiomegaly with COPD.   Electronically Signed   By: Davonna Belling M.D.   On: 07/06/2013 17:01   Ct Head Wo Contrast  07/06/2013   *RADIOLOGY REPORT*  Clinical Data: Hypertension, difficulty ambulating  CT HEAD WITHOUT CONTRAST  Technique:  Contiguous axial images were obtained from the base of the skull through the vertex without contrast.  Comparison: 03/23/2012  Findings: Diffuse global brain atrophy noted with prominence of the anterior frontal CSF spaces.  White matter microvascular changes present diffusely.  Chronic bilateral basal ganglia lacunar type infarcts, unchanged.  No acute intracranial hemorrhage, definite acute infarction, mass lesion, mass effect, focal edema, midline shift, herniation, hydrocephalus, or extra-axial fluid collection. No cerebellar abnormality.  Atherosclerosis of the intracranial vessels.  Orbits are  symmetric. Minor ethmoid mucosal thickening. Other sinuses clear.  Mastoids clear.  IMPRESSION: Stable brain atrophy, chronic microvascular ischemic change, and basal ganglia lacunar infarcts.  No interval change or acute process by noncontrast CT.   Original Report Authenticated By: Judie Petit. Miles Costain, M.D.     Assessment/Plan: 79 Y/O with multiple risk factors for stroke and new onset left HP, dysarthria, imbalance, and dizziness. On coumadin with therapeutic INR. Suspect left subcortical infarct. Admit to medicine. Complete stroke work up. Stroke team to resume care in the morning.  Wyatt Portela ,MD [facility] [phone]  07/06/2013, 6:51 PM

## 2013-07-06 NOTE — H&P (Signed)
[facility] History and Physical  TITUS DRONE WJX:[id number] DOB: 09/14/1928 DOA: 07/06/2013  Referring physician: PCP: No primary provider on file.  Specialists:   Chief Complaint: Bilateral lower extremity weakness with gait ataxia  HPI: [name] 79 yo WM PMHx macular degeneration, PAD, S/P PTA and stenting of the right SFA with implantation of a 6.0 x 40 mm Smart self-expanding stent To the right SFA on 11/06/12, HTN, DM type II, HLD, COPD, atrial fibrillation, cardiomegaly, GI bleed. States approximately 1000 this morning bilateral leg numbness/loss of balance when trying to ambulate. Severity of neurological deficits increased and wife insisted that patient come to [facility].Marland Kitchen patient states negative vision changes, negative slurred speech, negative cognitive difficulty, however during his time in the ED weakness progressed to his left upper extremity. Currently patient resting comfortably on gurney.     Review of Systems: The patient denies anorexia, fever, weight loss,, vision loss, decreased hearing, hoarseness, chest pain, syncope, dyspnea on exertion, peripheral edema, hemoptysis, abdominal pain, melena, hematochezia, severe indigestion/heartburn, hematuria, incontinence, genital sores, suspicious skin lesions, transient blindness, depression, unusual weight change, abnormal bleeding, enlarged lymph nodes, angioedema, and breast masses.    TRAVEL HISTORY: Negative travel    Past Medical History  Diagnosis Date  . Diabetes mellitus without complication   . Dysrhythmia     atrial fibrilation  . Hypertension   . Shortness of breath   . Peripheral vascular disease   . Claudication of calf muscles 11/05/2012    right calf  . GERD (gastroesophageal reflux disease)   . Arthritis   . TIA (transient ischemic attack) 2010   Past Surgical History  Procedure Laterality Date  . No past surgeries     Social History:  reports that he quit smoking about 31 years ago. His  smoking use included Cigarettes. He smoked 0.00 packs per day. He has never used smokeless tobacco. He reports that he does not drink alcohol or use illicit drugs.No Known Allergies lives with wife     No family history on file.   Prior to Admission medications   Medication Sig Start Date End Date Taking? Authorizing Provider  amLODipine (NORVASC) 5 MG tablet Take 7.5 mg by mouth daily.   Yes Historical Provider, MD  carvedilol (COREG) 6.25 MG tablet Take 6.25 mg by mouth 2 (two) times daily with a meal.   Yes Historical Provider, MD  cilostazol (PLETAL) 50 MG tablet Take 50 mg by mouth 2 (two) times daily.   Yes Historical Provider, MD  glipiZIDE (GLUCOTROL XL) 5 MG 24 hr tablet Take 5 mg by mouth daily.   Yes Historical Provider, MD  loratadine (CLARITIN) 10 MG tablet Take 10 mg by mouth daily.   Yes Historical Provider, MD  metFORMIN (GLUCOPHAGE) 1000 MG tablet Take 1 tablet (1,000 mg total) by mouth 2 (two) times daily with a meal. 11/09/12  Yes Pamella Pert, MD  Multiple Vitamins-Minerals (PRESERVISION AREDS PO) Take 1 capsule by mouth daily.   Yes Historical Provider, MD  olmesartan (BENICAR) 40 MG tablet Take 20 mg by mouth daily.    Yes Historical Provider, MD  simvastatin (ZOCOR) 40 MG tablet Take 40 mg by mouth every evening.   Yes Historical Provider, MD  tiotropium (SPIRIVA) 18 MCG inhalation capsule Place 18 mcg into inhaler and inhale daily.   Yes Historical Provider, MD  warfarin (COUMADIN) 5 MG tablet Take 5 mg by mouth daily.    Yes Historical Provider, MD   Physical Exam: Filed Vitals:  07/06/13 1400 07/06/13 1430 07/06/13 1445 07/06/13 1500  BP: 160/81 129/84 125/72 148/82  Pulse: 59 76 63 96  TempSrc:      Resp: 30 31 19 19   SpO2: 96% 95% 94% 91%     General:  A./O. x4,NAD  Eyes: Pupils equal reactive to light and accommodation  Neck: Negative JVD  Cardiovascular: Irregular irregular rhythm and rate, negative murmurs rubs gallops, DP/PT pulse one plus  bilateral  Respiratory: Good auscultation bilateral, decreased breath sounds diffusely, negative inspiratory/expiratory wheezing  Abdomen: Soft, nontender, nondistended plus bowel sounds  Musculoskeletal: Negative pedal edema  Neurologic: Is equal round reactive to light and accommodation, cranial nerves II through XII intact, uvula and tongue is Center line, left upper extremity/left lower extremity weakness strength 4/5, right upper extremity/right lower extremity strength 5/5, patient unable to remain in the seated position (falls off to the left), supported, unable to perform finger touch with left hand, difficulty finger to nose to finger left hand, sensation intact throughout, did not ambulate patient secondary to left-sided weakness  Labs on Admission:  Basic Metabolic Panel:  Recent Labs Lab 07/06/13 1300  NA 139  K 4.1  CL 104  CO2 24  GLUCOSE 84  BUN 19  CREATININE 0.91  CALCIUM 9.3   Liver Function Tests: No results found for this basename: AST, ALT, ALKPHOS, BILITOT, PROT, ALBUMIN,  in the last 168 hours No results found for this basename: LIPASE, AMYLASE,  in the last 168 hours No results found for this basename: AMMONIA,  in the last 168 hours CBC:  Recent Labs Lab 07/06/13 1300  WBC 7.3  NEUTROABS 4.2  HGB 13.1  HCT 38.5*  MCV 96.5  PLT 164   Cardiac Enzymes:  Recent Labs Lab 07/06/13 1300  TROPONINI <0.30    BNP (last 3 results) No results found for this basename: PROBNP,  in the last 8760 hours CBG:  Recent Labs Lab 07/06/13 1214  GLUCAP 73    Radiological Exams on Admission: Ct Head Wo Contrast  07/06/2013   *RADIOLOGY REPORT*  Clinical Data: Hypertension, difficulty ambulating  CT HEAD WITHOUT CONTRAST  Technique:  Contiguous axial images were obtained from the base of the skull through the vertex without contrast.  Comparison: 03/23/2012  Findings: Diffuse global brain atrophy noted with prominence of the anterior frontal CSF spaces.   White matter microvascular changes present diffusely.  Chronic bilateral basal ganglia lacunar type infarcts, unchanged.  No acute intracranial hemorrhage, definite acute infarction, mass lesion, mass effect, focal edema, midline shift, herniation, hydrocephalus, or extra-axial fluid collection. No cerebellar abnormality.  Atherosclerosis of the intracranial vessels.  Orbits are symmetric. Minor ethmoid mucosal thickening. Other sinuses clear.  Mastoids clear.  IMPRESSION: Stable brain atrophy, chronic microvascular ischemic change, and basal ganglia lacunar infarcts.  No interval change or acute process by noncontrast CT.   Original Report Authenticated By: Judie Petit. Shick, M.D.    EKG: Atrial fibrillation, anterior infarct age indeterminate   Assessment/Plan Active Problems:   * No active hospital problems. *   1. CVA; transfer to [facility], code stroke, neurology aware of patient per Dr. Adriana Simas -MRA MRI pending -Echocardiogram completed, results pending -Obtain troponin x3  2. HTN; continue patient's home dose of medication  3. Atrial fibrillation; INR therapeutic, pharmacy to manage Coumadin while patient hospitalized  4. Peripheral artery disease; continue patient's home medication  5. Cardiomegaly; CHF? Await findings of echocardiogram  6. Diabetes type 2; controlled with sensitive SSI  Code Status: Full Family Communication:  Patient's wife present for discussion of plan of care Disposition Plan:  Per neurology  Time spent:  90 minutes  WOODS, Roselind Messier [facility] Pager [phone]  If 7PM-7AM, please contact night-coverage [email or web address] Password [facility] 07/06/2013, 3:34 PM

## 2013-07-06 NOTE — Progress Notes (Signed)
Pt a 79 year old white male was a transfer from [facility] with Dx left side weakness and  Stroke like symptoms  &slurred speech. Initial assessments done the patient a x ax o x 4. Pt follows command  Stroke MD at beside evaluating pt . Vs stable pt on cardiac monitor Rhythm  A-fib  Will continue to Monitor  Pt oriented to call bell  Told to call when in need  Bed exit alarm in use . No distress noted at present remain NPO for now

## 2013-07-06 NOTE — ED Notes (Addendum)
CBG recorded in ED is 73.

## 2013-07-06 NOTE — Progress Notes (Signed)
Pt confirms pcp is Adult nurse EPIC updated

## 2013-07-06 NOTE — ED Provider Notes (Addendum)
CSN: [id number]     Arrival date & time 07/06/13  1213 History   First MD Initiated Contact with Patient 07/06/13 1226     Chief Complaint  Patient presents with  . LE numbness    (Consider location/radiation/quality/duration/timing/severity/associated sxs/prior Treatment) HPI..... level V caveat for urgent need for intervention. sensation of tingling in both lower legs this morning approximately 0700.  Legs also felt heavy.   Patient also complains of funny sensation in his head. His gait was ataxic.  Blood pressure noted to be elevated. His symptoms have improved in the emergency department.    past medical history diabetes, hypertension, peripheral vascular disease, TIA  Past Medical History  Diagnosis Date  . Diabetes mellitus without complication   . Dysrhythmia     atrial fibrilation  . Hypertension   . Shortness of breath   . Peripheral vascular disease   . Claudication of calf muscles 11/05/2012    right calf  . GERD (gastroesophageal reflux disease)   . Arthritis   . TIA (transient ischemic attack) 2010   Past Surgical History  Procedure Laterality Date  . No past surgeries     No family history on file. History  Substance Use Topics  . Smoking status: Former Smoker    Types: Cigarettes    Quit date: 09/23/1981  . Smokeless tobacco: Never Used  . Alcohol Use: No     Comment: QUITS YEARS AGO    Review of Systems  Unable to perform ROS   Allergies  Review of patient's allergies indicates no known allergies.  Home Medications   Current Outpatient Rx  Name  Route  Sig  Dispense  Refill  . carvedilol (COREG) 6.25 MG tablet   Oral   Take 6.25 mg by mouth 2 (two) times daily with a meal.         . cilostazol (PLETAL) 50 MG tablet   Oral   Take 50 mg by mouth 2 (two) times daily.         Marland Kitchen enoxaparin (LOVENOX) 120 MG/0.8ML injection   Subcutaneous   Inject 120 mg into the skin daily.         . metFORMIN (GLUCOPHAGE) 1000 MG tablet   Oral   Take  1 tablet (1,000 mg total) by mouth 2 (two) times daily with a meal.         . olmesartan (BENICAR) 40 MG tablet   Oral   Take 20 mg by mouth daily.          Marland Kitchen warfarin (COUMADIN) 5 MG tablet   Oral   Take 5-10 mg by mouth daily.          Marland Kitchen amLODipine (NORVASC) 5 MG tablet   Oral   Take 7.5 mg by mouth daily.         Marland Kitchen aspirin EC 81 MG EC tablet   Oral   Take 1 tablet (81 mg total) by mouth daily.         Marland Kitchen glipiZIDE (GLUCOTROL XL) 5 MG 24 hr tablet   Oral   Take 5 mg by mouth daily.         Marland Kitchen loratadine (CLARITIN) 10 MG tablet   Oral   Take 10 mg by mouth daily.         . Multiple Vitamins-Minerals (PRESERVISION AREDS PO)   Oral   Take 1 capsule by mouth daily.         Marland Kitchen omeprazole (PRILOSEC) 20 MG capsule   Oral  Take 20 mg by mouth daily.         . simvastatin (ZOCOR) 40 MG tablet   Oral   Take 40 mg by mouth every evening.         . tiotropium (SPIRIVA) 18 MCG inhalation capsule   Inhalation   Place 18 mcg into inhaler and inhale daily.          BP 201/100  Pulse 85  Resp 22  SpO2 97% Physical Exam  Nursing note and vitals reviewed. Constitutional: He is oriented to person, place, and time. He appears well-developed and well-nourished.  Blood pressure noted to be elevated.  HENT:  Head: Normocephalic and atraumatic.  Eyes: Conjunctivae and EOM are normal. Pupils are equal, round, and reactive to light.  Neck: Normal range of motion. Neck supple.  Cardiovascular: Normal rate, regular rhythm and normal heart sounds.   Pulmonary/Chest: Effort normal and breath sounds normal.  Abdominal: Soft. Bowel sounds are normal.  Musculoskeletal: Normal range of motion.  Neurological: He is alert and oriented to person, place, and time.  Full range of motion of arms and legs. No facial asymmetry  Skin: Skin is warm and dry.  Psychiatric: He has a normal mood and affect.    ED Course  Procedures (including critical care time) Labs  Review Labs Reviewed  BASIC METABOLIC PANEL - Abnormal; Notable for the following:    GFR calc non Af Amer 76 (*)    GFR calc Af Amer 88 (*)    All other components within normal limits  CBC WITH DIFFERENTIAL - Abnormal; Notable for the following:    RBC 3.99 (*)    HCT 38.5 (*)    Monocytes Relative 19 (*)    Monocytes Absolute 1.4 (*)    All other components within normal limits  URINALYSIS, ROUTINE W REFLEX MICROSCOPIC - Abnormal; Notable for the following:    Leukocytes, UA TRACE (*)    All other components within normal limits  PROTIME-INR - Abnormal; Notable for the following:    Prothrombin Time 22.6 (*)    INR 2.06 (*)    All other components within normal limits  GLUCOSE, CAPILLARY  TROPONIN I  URINE MICROSCOPIC-ADD ON   Imaging Review No results found.  EKG Interpretation     Ventricular Rate:  89 PR Interval:    QRS Duration: 67 QT Interval:  351 QTC Calculation: 427 R Axis:   64 Text Interpretation:  Atrial fibrillation Anterior infarct, old          Ct Head Wo Contrast  07/06/2013   *RADIOLOGY REPORT*  Clinical Data: Hypertension, difficulty ambulating  CT HEAD WITHOUT CONTRAST  Technique:  Contiguous axial images were obtained from the base of the skull through the vertex without contrast.  Comparison: 03/23/2012  Findings: Diffuse global brain atrophy noted with prominence of the anterior frontal CSF spaces.  White matter microvascular changes present diffusely.  Chronic bilateral basal ganglia lacunar type infarcts, unchanged.  No acute intracranial hemorrhage, definite acute infarction, mass lesion, mass effect, focal edema, midline shift, herniation, hydrocephalus, or extra-axial fluid collection. No cerebellar abnormality.  Atherosclerosis of the intracranial vessels.  Orbits are symmetric. Minor ethmoid mucosal thickening. Other sinuses clear.  Mastoids clear.  IMPRESSION: Stable brain atrophy, chronic microvascular ischemic change, and basal ganglia  lacunar infarcts.  No interval change or acute process by noncontrast CT.   Original Report Authenticated By: Judie Petit. Miles Costain, M.D.  CRITICAL CARE Performed by: Donnetta Hutching Total critical care time: 30 Critical care time  was exclusive of separately billable procedures and treating other patients. Critical care was necessary to treat or prevent imminent or life-threatening deterioration. Critical care was time spent personally by me on the following activities: development of treatment plan with patient and/or surrogate as well as nursing, discussions with consultants, evaluation of patient's response to treatment, examination of patient, obtaining history from patient or surrogate, ordering and performing treatments and interventions, ordering and review of laboratory studies, ordering and review of radiographic studies, pulse oximetry and re-evaluation of patient's condition.  MDM  No diagnosis found. Symptoms had initially improved. Approximately 1445 patient complained of left arm flaccidity.  Discussed with neurologist and internist.  Neurologist did not think thrombolytics were appropriate.  Will transfer patient to [facility].  No thrombolytics at this point     Donnetta Hutching, MD 07/06/13 1531  Donnetta Hutching, MD 07/10/13 [phone]

## 2013-07-06 NOTE — Progress Notes (Signed)
PHARMACIST - PHYSICIAN COMMUNICATION CONCERNING: Pharmacy Care Issues Regarding Warfarin Labs  RECOMMENDATION (Action Taken): A baseline and daily protime for three days has been ordered to meet the [facility] Patient safety goal and comply with the current [facility] Pharmacy & Therapeutics Committee policy.   The Pharmacy will defer all warfarin dose order changes and follow up of lab results to the prescriber unless an additional order to initiate a "pharmacy Coumadin consult" is placed.  DESCRIPTION:  While hospitalized, to be in compliance with The Joint Commission National Patient Safety Goals, all patients on warfarin must have a baseline and/or current protime prior to the administration of warfarin. Pharmacy has received your order for warfarin without these required laboratory assessments.   Geoffry Paradise, PharmD, BCPS Pager: [phone] 3:40 PM Pharmacy #: [phone]

## 2013-07-06 NOTE — ED Notes (Signed)
Pt was at home and this am awaken and went to walk dog at 0700 and wife states that pt said his lt arm was numb and bil legs feel heavy, head feels "funny" cant explain the feeling. Wife said pt is not acting his normal she said he usally is walking and up making breakfast, pt leaded up against the house while waling dog due to he could not get his balance. BP 207/118, HX of HTN, Alert to person just slow to answer questions. Md cook notified and at bedside . Pt is also on blood thinners

## 2013-07-06 NOTE — ED Notes (Signed)
Per pt, states he woke up with lower extremity numbness, states he feels dizzy-CBG 73

## 2013-07-06 NOTE — ED Notes (Signed)
Wife came out of room after md cook left and told nurse that pt could not move his lt arm at all. Pt lt arm was flaccid. Pt was able to move lt leg. MD Adriana Simas back a bedisde to see pt. Alert x4, denies any headache BP 148/82

## 2013-07-06 NOTE — Progress Notes (Addendum)
ANTICOAGULATION CONSULT NOTE - Initial Consult  Pharmacy Consult for Coumadin Indication: atrial fibrillation  No Known Allergies  Patient Measurements:    Vital Signs: Temp src: Oral (10/14 1227) BP: 148/82 mmHg (10/14 1500) Pulse Rate: 96 (10/14 1500)  Labs:  Recent Labs  07/06/13 1300 07/06/13 1310  HGB 13.1  --   HCT 38.5*  --   PLT 164  --   LABPROT  --  22.6*  INR  --  2.06*  CREATININE 0.91  --   TROPONINI <0.30  --     The CrCl is unknown because both a height and weight (above a minimum accepted value) are required for this calculation.   Medical History: Past Medical History  Diagnosis Date  . Diabetes mellitus without complication   . Dysrhythmia     atrial fibrilation  . Hypertension   . Shortness of breath   . Peripheral vascular disease   . Claudication of calf muscles 11/05/2012    right calf  . GERD (gastroesophageal reflux disease)   . Arthritis   . TIA (transient ischemic attack) 2010     Assessment:  88 yoM with h/o atrial fibrillation on Coumadin PTA presented 10/14 with CVA. Patient was reportedly taking Coumadin 5mg  daily with last dose 10/14 AM.  INR on admit 2.06. MD ordered Coumadin per pharmacy. Plan transfer to [facility].   Goal of Therapy:  INR 2-3 Monitor platelets by anticoagulation protocol: Yes   Plan:   No Coumadin tonight  D/C Sq Lovenox as INR is therapeutic  Daily PT/INR  Will communicate with pharmacy at [facility] and f/u plans  Geoffry Paradise, PharmD, BCPS Pager: [phone] 5:05 PM Pharmacy #: [id number]

## 2013-07-06 NOTE — Progress Notes (Signed)
*  PRELIMINARY RESULTS* Echocardiogram 2D Echocardiogram has been performed.  Jeryl Columbia 07/06/2013, 4:27 PM

## 2013-07-06 NOTE — ED Notes (Signed)
Attempted to call report first time.

## 2013-07-07 ENCOUNTER — Inpatient Hospital Stay (HOSPITAL_COMMUNITY): Payer: Medicare Other

## 2013-07-07 ENCOUNTER — Encounter (HOSPITAL_COMMUNITY): Payer: Self-pay | Admitting: Radiology

## 2013-07-07 DIAGNOSIS — G459 Transient cerebral ischemic attack, unspecified: Secondary | ICD-10-CM

## 2013-07-07 DIAGNOSIS — I6350 Cerebral infarction due to unspecified occlusion or stenosis of unspecified cerebral artery: Secondary | ICD-10-CM

## 2013-07-07 LAB — LIPID PANEL
Cholesterol: 102 mg/dL (ref 0–200)
HDL: 33 mg/dL — ABNORMAL LOW (ref >39)
Total CHOL/HDL Ratio: 3.1 RATIO
Triglycerides: 79 mg/dL (ref <150)
VLDL: 16 mg/dL (ref 0–40)

## 2013-07-07 LAB — GLUCOSE, CAPILLARY
Glucose-Capillary: 110 mg/dL — ABNORMAL HIGH (ref 70–99)
Glucose-Capillary: 78 mg/dL (ref 70–99)
Glucose-Capillary: 91 mg/dL (ref 70–99)

## 2013-07-07 MED ORDER — INSULIN ASPART 100 UNIT/ML ~~LOC~~ SOLN
0.0000 [IU] | Freq: Three times a day (TID) | SUBCUTANEOUS | Status: DC
  Administered 2013-07-08 (×3): 1 [IU] via SUBCUTANEOUS
  Administered 2013-07-09 (×3): 2 [IU] via SUBCUTANEOUS

## 2013-07-07 MED ORDER — RIVAROXABAN 20 MG PO TABS
20.0000 mg | ORAL_TABLET | Freq: Every day | ORAL | Status: DC
  Administered 2013-07-07 – 2013-07-09 (×3): 20 mg via ORAL
  Filled 2013-07-07 (×4): qty 1

## 2013-07-07 NOTE — Progress Notes (Signed)
Rehab Admissions Coordinator Note:  Patient was screened by Clois Dupes for appropriateness for an Inpatient Acute Rehab Consult.  At this time, we are recommending Inpatient Rehab consult. I will contact Dr. Cena Benton.  Clois Dupes 07/07/2013, 5:12 PM  I can be reached at [phone].

## 2013-07-07 NOTE — Evaluation (Signed)
Physical Therapy Evaluation Patient Details Name: [name] MRN: [id number] DOB: Sep 16, 1928 Today's Date: 07/07/2013 Time: 7829-5621 PT Time Calculation (min): 43 min  PT Assessment / Plan / Recommendation History of Present Illness  [name] is an 79 y.o. male with a past medical history significant for HTN, DM, atrial fibrillation on coumadin, PVD, TIA, GERD, who was in his usual state of health until this morning when he developed new onset dizziness, imbalance, " tightness of both legs", and speech changes noticed by wife. He said that later on the right arm and leg became weak, " may be around 10 am, but I really don't know". Initially evaluated at [facility] ED. Pt now at [facility] undergoing stroke work up.   Clinical Impression  Patient demonstrates deficits in functional mobility as indicated below. PLOF patient was independent. Currently patient demonstrates significant weakness on left side LUE >LLE.  Patient unable to stand without 2 person assist at this time.  Patient and spouse educated on management of stroke symptoms including edema control. Patient has good family support and is highly motivated. Feel patient would be ideal candidate for CIR at this time given the nature of his deficits. Will continue to see acutely to address mobility and educate patient and family. DME TBD.     PT Assessment  Patient needs continued PT services    Follow Up Recommendations  CIR;Supervision/Assistance - 24 hour          Equipment Recommendations   (TBD)    Recommendations for Other Services Rehab consult   Frequency Min 3X/week    Precautions / Restrictions Precautions Precautions: Fall Restrictions Weight Bearing Restrictions: No   Pertinent Vitals/Pain Patient reports no pain, HR 108 with activity      Mobility  Bed Mobility Bed Mobility: Rolling Right;Right Sidelying to Sit;Sitting - Scoot to [place] of Bed Rolling Right: 4: Min assist Right Sidelying to Sit: 4: Min  assist Supine to Sit: 4: Min assist Details for Bed Mobility Assistance: multiple sttempts to perform using strong side with multiple loss of balance back down onto side Transfers Transfers: Sit to Stand;Stand to Sit;Stand Pivot Transfers Sit to Stand: 1: +2 Total assist;From elevated surface;With armrests;From bed;From chair/3-in-1 Sit to Stand: Patient Percentage: 30% Stand to Sit: 1: +2 Total assist;To bed;To chair/3-in-1 Stand to Sit: Patient Percentage: 30% Stand Pivot Transfers: 1: +2 Total assist;From elevated surface Stand Pivot Transfers: Patient Percentage: 20% Details for Transfer Assistance: Stand pivot performed with face to face bear hug technique +2 assist using gait belt, LLE buckling requiring complete blocking to control stability. Ambulation/Gait Ambulation/Gait Assistance: Not tested (comment)    Exercises General Exercises - Lower Extremity Ankle Circles/Pumps: AROM;Both;10 reps Long Arc Quad: AROM;Both;10 reps (AAROM at intially) Straight Leg Raises: AROM;Both;10 reps (AAROM initially)   PT Diagnosis: Difficulty walking;Abnormality of gait;Generalized weakness;Hemiplegia non-dominant side  PT Problem List: Decreased strength;Decreased range of motion;Decreased activity tolerance;Decreased balance;Decreased mobility;Decreased knowledge of use of DME PT Treatment Interventions: DME instruction;Gait training;Functional mobility training;Therapeutic activities;Therapeutic exercise;Balance training;Patient/family education     PT Goals(Current goals can be found in the care plan section) Acute Rehab PT Goals Patient Stated Goal: to be able to walk again PT Goal Formulation: With patient Time For Goal Achievement: 07/21/13 Potential to Achieve Goals: Fair  Visit Information  Last PT Received On: 07/07/13 Assistance Needed: +2 History of Present Illness: [name] is an 79 y.o. male with a past medical history significant for HTN, DM, atrial fibrillation on  coumadin, PVD, TIA,  GERD, who was in his usual state of health until this morning when he developed new onset dizziness, imbalance, " tightness of both legs", and speech changes noticed by wife. He said that later on the right arm and leg became weak, " may be around 10 am, but I really don't know". Initially evaluated at [facility] ED. Pt now at [facility] undergoing stroke work up.        Prior Functioning  Home Living Family/patient expects to be discharged to:: Private residence Living Arrangements: Spouse/significant other Available Help at Discharge: Family Type of Home: Mobile home Home Access: Stairs to enter Entrance Stairs-Number of Steps: 1 Home Equipment: Cane - single point Prior Function Level of Independence: Independent;Independent with assistive device(s) (occassional use of cane) Communication Communication: No difficulties Dominant Hand: Right    Cognition  Cognition Arousal/Alertness: Awake/alert Behavior During Therapy: WFL for tasks assessed/performed Overall Cognitive Status: Within Functional Limits for tasks assessed    Extremity/Trunk Assessment Upper Extremity Assessment Upper Extremity Assessment: Defer to OT evaluation Lower Extremity Assessment Lower Extremity Assessment: LLE deficits/detail LLE Deficits / Details: weakness, decreased sensation, poor coordination (2+/5 supine SLR, 2+/5 knee flexion EOB, 2+/5 knee ext) LLE Coordination: decreased gross motor   Balance Balance Balance Assessed: Yes Static Sitting Balance Static Sitting - Balance Support: Feet supported Static Sitting - Level of Assistance: 5: Stand by assistance Static Sitting - Comment/# of Minutes: 8 minutes EOB  End of Session PT - End of Session Equipment Utilized During Treatment: Gait belt Activity Tolerance: Patient limited by fatigue Patient left: in chair;with call bell/phone within reach;with family/visitor present Nurse Communication: Mobility status;Need for lift equipment  GP      Fabio Asa 07/07/2013, 4:52 PM Charlotte Crumb, PT DPT  [phone]

## 2013-07-07 NOTE — Progress Notes (Signed)
Carotid Doppler has been completed.  PRELIMINARY Bilateral:  1-39% ICA stenosis.  Vertebral artery flow is antegrade.  Due to calcific plaque at bilateral ICA bulb, higher velocities may be obscured.    Farrel Demark, RDMS, RVT 07/07/2013

## 2013-07-07 NOTE — Progress Notes (Signed)
Pt's left arm observed to be flaccid at 2350,weaker than it was at shift change,could not lift his left leg either,NIH now read 8,previously 6 at 2000,pt c/o of being weak,BP read 162/53 at 2349, Natalia Leatherwood Schoor(on call) paged and notified,suggested to page neurologist,so Dr Thad Ranger paged,called back and ordered a STAT CT same done at 0150,results pending,pt placed on oxygen at 2LNC,will however continue to monitor. [name], [name]

## 2013-07-07 NOTE — Progress Notes (Signed)
[facility] PROGRESS NOTE  [name] ZOX:[id number] DOB: [birth year] DOA: 07/06/2013 PCP: Pearson Grippe, MD  Assessment/Plan: 1. CVA - Neurology on board and managing. Will await their recommendations moving forward - work up pending. (MRI/MRA head, 2 d echocardiogram, carotid doppler)  2. HTN - Pt on amlodipine, carvedilol - will not treat HTN aggressively given # 1  3. Atrial fibrillation - At this point rate controlled on carvedilol - Coumadin on board with INR therapuetic at 2.3  4. PAD - statin on board.  5. DM 2 - Diabetic diet  - SSI - blood sugars relatively well controlled.   Code Status: full Family Communication: discussed with patient Disposition Plan: Pending further work up and evaluation.   Consultants:  Neurology  Procedures:  CT of head  Antibiotics:  None  HPI/Subjective: Patient has no new complaints today.  Still complaining of left sided weakness.  No acute issues reported overnight.  Objective: Filed Vitals:   07/07/13 0700  BP: 162/81  Pulse: 91  Temp: 98.4 F (36.9 C)  Resp: 18    Intake/Output Summary (Last 24 hours) at 07/07/13 0930 Last data filed at 07/07/13 0900  Gross per 24 hour  Intake    240 ml  Output    775 ml  Net   -535 ml   Filed Weights   07/06/13 1752  Weight: 78.6 kg (173 lb 4.5 oz)    Exam:   General:  Pt in NAD, Alert and Awake  Cardiovascular: irregularly irregular, no Murmurs  Respiratory: CTA BL, no wheezes  Abdomen: soft, NT, ND  Musculoskeletal: no clubbing  Neuro: LUE and LLE weakness when compared to right side.   Data Reviewed: Basic Metabolic Panel:  Recent Labs Lab 07/06/13 1300  NA 139  K 4.1  CL 104  CO2 24  GLUCOSE 84  BUN 19  CREATININE 0.91  CALCIUM 9.3   Liver Function Tests: No results found for this basename: AST, ALT, ALKPHOS, BILITOT, PROT, ALBUMIN,  in the last 168 hours No results found for this basename: LIPASE, AMYLASE,  in the last 168  hours No results found for this basename: AMMONIA,  in the last 168 hours CBC:  Recent Labs Lab 07/06/13 1300  WBC 7.3  NEUTROABS 4.2  HGB 13.1  HCT 38.5*  MCV 96.5  PLT 164   Cardiac Enzymes:  Recent Labs Lab 07/06/13 1300 07/06/13 1710  TROPONINI <0.30 <0.30   BNP (last 3 results) No results found for this basename: PROBNP,  in the last 8760 hours CBG:  Recent Labs Lab 07/06/13 1818 07/06/13 1948 07/06/13 2354 07/07/13 0408 07/07/13 0823  GLUCAP 82 78 91 110* 95    No results found for this or any previous visit (from the past 240 hour(s)).   Studies: Dg Chest 2 View  07/06/2013   CLINICAL DATA:  Left-sided weakness and shortness of breath. Previous smoker.  EXAM: CHEST  2 VIEW  COMPARISON:  04/01/2012.  FINDINGS: Cardiac enlargement. Patchy lower lobe opacities appear improved mild bibasilar scarring. No infiltrates or failure. Calcified tortuous aorta. Negative osseous structures.  IMPRESSION: Slight improvement in aeration. Chronic bronchitic changes. Cardiomegaly with COPD.   Electronically Signed   By: Davonna Belling M.D.   On: 07/06/2013 17:01   Ct Head Wo Contrast  07/07/2013   CLINICAL DATA:  Left-sided weakness.  EXAM: CT HEAD WITHOUT CONTRAST  TECHNIQUE: Contiguous axial images were obtained from the base of the skull through the vertex without intravenous contrast.  COMPARISON:  Head  CT from yesterday  FINDINGS: No acute osseous or soft tissue changes. The orbits show no acute findings.  Prominent extra-axial spaces around the cerebral convexities, left more than right. Unchanged pattern of bilateral deep gray and white matter lacunar infarcts and patchy bilateral cerebral white matter ischemic change. No evidence of acute infarct, hemorrhage, hydrocephalus, or mass lesion.  IMPRESSION: 1. Stable appearance of the brain. No definitive infarct or other acute abnormality. 2. Extensive chronic small vessel ischemic injury.   Electronically Signed   By: Tiburcio Pea M.D.   On: 07/07/2013 02:04   Ct Head Wo Contrast  07/06/2013   *RADIOLOGY REPORT*  Clinical Data: Hypertension, difficulty ambulating  CT HEAD WITHOUT CONTRAST  Technique:  Contiguous axial images were obtained from the base of the skull through the vertex without contrast.  Comparison: 03/23/2012  Findings: Diffuse global brain atrophy noted with prominence of the anterior frontal CSF spaces.  White matter microvascular changes present diffusely.  Chronic bilateral basal ganglia lacunar type infarcts, unchanged.  No acute intracranial hemorrhage, definite acute infarction, mass lesion, mass effect, focal edema, midline shift, herniation, hydrocephalus, or extra-axial fluid collection. No cerebellar abnormality.  Atherosclerosis of the intracranial vessels.  Orbits are symmetric. Minor ethmoid mucosal thickening. Other sinuses clear.  Mastoids clear.  IMPRESSION: Stable brain atrophy, chronic microvascular ischemic change, and basal ganglia lacunar infarcts.  No interval change or acute process by noncontrast CT.   Original Report Authenticated By: Judie Petit. Shick, M.D.    Scheduled Meds: . amLODipine  7.5 mg Oral Daily  . carvedilol  6.25 mg Oral BID WC  . cilostazol  50 mg Oral BID  . insulin aspart  0-9 Units Subcutaneous Q4H  . irbesartan  150 mg Oral Daily  . loratadine  10 mg Oral Daily  . simvastatin  40 mg Oral QPM  . tiotropium  18 mcg Inhalation Daily  . Warfarin - Pharmacist Dosing Inpatient   Does not apply q1800   Continuous Infusions:   Principal Problem:   CVA (cerebral infarction) Active Problems:   TIA (transient ischemic attack)   PAD (peripheral artery disease)   HTN (hypertension)   Diabetes mellitus, type 2   HLD (hyperlipidemia)   COPD (chronic obstructive pulmonary disease)   Atrial fibrillation   History of GI bleed   Cardiomegaly    Time spent: > 35 minutes     [name]  [facility] Pager [phone]. If 7PM-7AM, please contact night-coverage  at [email or web address], password [facility] 07/07/2013, 9:30 AM  LOS: 1 day

## 2013-07-07 NOTE — Progress Notes (Signed)
ANTICOAGULATION CONSULT NOTE - Initial Consult  Pharmacy Consult for coumadin>>xarelto Indication: atrial fibrillation and stroke  No Known Allergies  Patient Measurements: Height: 5\' 9"  (175.3 cm) Weight: 173 lb 4.5 oz (78.6 kg) IBW/kg (Calculated) : 70.7  Vital Signs: Temp: 98.1 F (36.7 C) (10/15 1300) Temp src: Oral (10/15 1300) BP: 129/71 mmHg (10/15 1300) Pulse Rate: 76 (10/15 1300)  Labs:  Recent Labs  07/06/13 1300 07/06/13 1310 07/06/13 1710 07/07/13 0500  HGB 13.1  --   --   --   HCT 38.5*  --   --   --   PLT 164  --   --   --   LABPROT  --  22.6*  --  24.6*  INR  --  2.06*  --  2.31*  CREATININE 0.91  --   --   --   TROPONINI <0.30  --  <0.30  --     Estimated Creatinine Clearance: 61.5 ml/min (by C-G formula based on Cr of 0.91).   Medical History: Past Medical History  Diagnosis Date  . Diabetes mellitus without complication   . Dysrhythmia     atrial fibrilation  . Hypertension   . Shortness of breath   . Peripheral vascular disease   . Claudication of calf muscles 11/05/2012    right calf  . GERD (gastroesophageal reflux disease)   . Arthritis   . TIA (transient ischemic attack) 2010   Assessment: 79 year old male presenting to [facility] with lower extremity weakness and gait ataxia. Stroke workup underway. Patient was on warfarin pta for afib and history of TIA, INR was therapeutic at 2.0 and coumadin was continued. Patient now believed to be a coumadin failure given possible stroke while therapeutic on coumadin so we are changing anticoagulant agents to xarelto. Looking through previous admission and history of GIB is not very clear.   Recommendations are to start rivaroxaban once INR trends down <3 which it currently is at 2.3. Renal function is excellent so even though age is 81 no dose adjustments are warranted. Rivaroxaban will interfere with INR testing and is not beneficial so will d/c remaining INR checks. Will plan to begin rivaroxaban 20mg   with evening meal tonight.  Goal of Therapy:  Appropriate anticoagulation dosing Monitor platelets by anticoagulation protocol: Yes   Plan:  Xarelto 20mg  daily with evening meal D/c remaining INR checks  Sheppard Coil PharmD., BCPS Clinical Pharmacist Pager [phone] 07/07/2013 2:49 PM

## 2013-07-07 NOTE — Progress Notes (Signed)
Stroke Team Progress Note  HISTORY [name] is an 79 y.o. male with a past medical history significant for HTN, DM, atrial fibrillation on coumadin,   PVD, TIA, GERD, who was in his usual state of health until this morning when he developed new onset dizziness, imbalance, " tightness of both legs", and speech changes noticed by wife. He said that later on the right arm and leg became weak, " may be around 10 am, but I really don't know". Initially evaluated at [facility] ED.   Denies HA, vertigo, double vision, difficulty swallowing, confusion, or visual disturbances.'  CT brain showed no acute abnormality.   INR 2.06 on admission: patient is on coumadin for atrial fibrillation  Patient was not a TPA candidate secondary to therapeutic INR. He was admitted for further evaluation and treatment.  SUBJECTIVE He is sitting in room with wife.  Overall he feels his condition is rapidly improving. On coumadin for afib.  OBJECTIVE Most recent Vital Signs: Filed Vitals:   07/07/13 0250 07/07/13 0408 07/07/13 0500 07/07/13 0700  BP: 187/95 164/89 168/90 162/81  Pulse: 71 62 80 91  Temp: 98.5 F (36.9 C)  98 F (36.7 C) 98.4 F (36.9 C)  TempSrc:      Resp: 18  18 18   Height:      Weight:      SpO2: 96% 98% 98% 98%   CBG (last 3)   Recent Labs  07/06/13 1948 07/06/13 2354 07/07/13 0408  GLUCAP 78 91 110*    IV Fluid Intake:     MEDICATIONS  . amLODipine  7.5 mg Oral Daily  . carvedilol  6.25 mg Oral BID WC  . cilostazol  50 mg Oral BID  . insulin aspart  0-9 Units Subcutaneous Q4H  . irbesartan  150 mg Oral Daily  . loratadine  10 mg Oral Daily  . simvastatin  40 mg Oral QPM  . tiotropium  18 mcg Inhalation Daily  . Warfarin - Pharmacist Dosing Inpatient   Does not apply q1800   PRN:  acetaminophen, acetaminophen  Diet:  Carb Control thin liquids Activity:  Bedrest  DVT Prophylaxis:  INR therapeutic.  CLINICALLY SIGNIFICANT STUDIES Basic Metabolic Panel:  Recent  Labs Lab 07/06/13 1300  NA 139  K 4.1  CL 104  CO2 24  GLUCOSE 84  BUN 19  CREATININE 0.91  CALCIUM 9.3   Liver Function Tests: No results found for this basename: AST, ALT, ALKPHOS, BILITOT, PROT, ALBUMIN,  in the last 168 hours CBC:  Recent Labs Lab 07/06/13 1300  WBC 7.3  NEUTROABS 4.2  HGB 13.1  HCT 38.5*  MCV 96.5  PLT 164   Coagulation:  Recent Labs Lab 07/06/13 1310 07/07/13 0500  LABPROT 22.6* 24.6*  INR 2.06* 2.31*   Cardiac Enzymes:  Recent Labs Lab 07/06/13 1300 07/06/13 1710  TROPONINI <0.30 <0.30   Urinalysis:  Recent Labs Lab 07/06/13 1323  COLORURINE YELLOW  LABSPEC 1.018  PHURINE 5.0  GLUCOSEU NEGATIVE  HGBUR NEGATIVE  BILIRUBINUR NEGATIVE  KETONESUR NEGATIVE  PROTEINUR NEGATIVE  UROBILINOGEN 1.0  NITRITE NEGATIVE  LEUKOCYTESUR TRACE*   Lipid Panel    Component Value Date/Time   CHOL 102 07/07/2013 0500   TRIG 79 07/07/2013 0500   HDL 33* 07/07/2013 0500   CHOLHDL 3.1 07/07/2013 0500   VLDL 16 07/07/2013 0500   LDLCALC 53 07/07/2013 0500   HgbA1C  Lab Results  Component Value Date   HGBA1C 5.6 07/06/2013    Urine Drug  Screen:   No results found for this basename: labopia, cocainscrnur, labbenz, amphetmu, thcu, labbarb    Alcohol Level: No results found for this basename: ETH,  in the last 168 hours  Dg Chest 2 View 07/06/2013   : Slight improvement in aeration. Chronic bronchitic changes. Cardiomegaly with COPD.    07/07/2013   1. Stable appearance of the brain. No definitive infarct or other acute abnormality.    Ct Head Wo Contrast 07/06/2013   Stable brain atrophy, chronic microvascular ischemic change, and basal ganglia lacunar infarcts.  No interval change 07/07/2013   Stable appearance of the brain. No definitive infarct or other acute abnormality. 2. Extensive chronic small vessel ischemic injury.   MRI of the brain    MRA of the brain    2D Echocardiogram  EF 60%, wall motion normal, LA mod-severe dilated.  No ASD or PFO identified.  Carotid Doppler    EKG  atrial fibrillation.   Therapy Recommendations   Physical Exam   Mental Status:  Alert, oriented, thought content appropriate. Dysarthric.. Able to follow 3 step commands without difficulty.  Cranial Nerves:  II: Discs flat bilaterally; Visual fields grossly normal, pupils equal, round, reactive to light and accommodation  III,IV, VI: ptosis not present, extra-ocular motions intact bilaterally  V,VII: smile symmetric, facial light touch sensation normal bilaterally  VIII: hearing normal bilaterally  IX,X: gag reflex present  XI: bilateral shoulder shrug  XII: midline tongue extension  Motor:  Significant for right HP, arm( 0/5) greater than leg ( 2-3/5) Tone and bulk:normal tone throughout; no atrophy noted  Sensory: Pinprick and light touch intact throughout, bilaterally  Deep Tendon Reflexes:  1 all over  Plantars:  Right: downgoing Left: upgoing  Cerebellar:  Impaired finger to nose and HKS in the left side.  Gait:  No tested.  CV: pulses palpable throughout    ASSESSMENT [name] is a 79 y.o. male presenting with dizziness, imbalance and right hemiparesis/ataxia. MR Imaging pending. Infarct felt to be embolic secondary to atrial fibrillation.  On warfarin prior to admission. Now on warfarin for secondary stroke prevention. Patient with resultant right hemiparesis. Work up underway.   Diabetes mellitus, type 2, HGB A1C, 5.6  Hyperlipidemia, LDL 53, at goal on statin  Atrial fibrillation  Hypertension  Peripheral vascular disease  Hospital day # 1  TREATMENT/PLAN  Change to xarelto for secondary stroke prevention. Will ask for pharmacy to assist on timing when to start medication given that his INR is elevated.  Risk factor modification  Considered a coumadin failure due to stroke.  Have patient follow up with Dr. Pearlean Brownie in 2 months in stroke clinic.   Gwendolyn Lima. Manson Passey, [facility], MBA, MHA [facility] Pager: [phone] 07/07/2013 2:47 PM  I have personally obtained a history, examined the patient, evaluated imaging results, and formulated the assessment and plan of care. I agree with the above. Delia Heady, MD

## 2013-07-07 NOTE — Evaluation (Signed)
Speech Language Pathology Evaluation Patient Details Name: [name] MRN: [id number] DOB: [birth year] Today's Date: 07/07/2013 Time: 8295-6213 SLP Time Calculation (min): 31 min  Problem List:  Patient Active Problem List   Diagnosis Date Noted  . TIA (transient ischemic attack) 07/06/2013  . PAD (peripheral artery disease) 07/06/2013  . HTN (hypertension) 07/06/2013  . Diabetes mellitus, type 2 07/06/2013  . HLD (hyperlipidemia) 07/06/2013  . COPD (chronic obstructive pulmonary disease) 07/06/2013  . Atrial fibrillation 07/06/2013  . History of GI bleed 07/06/2013  . Cardiomegaly 07/06/2013  . CVA (cerebral infarction) 07/06/2013   Past Medical History:  Past Medical History  Diagnosis Date  . Diabetes mellitus without complication   . Dysrhythmia     atrial fibrilation  . Hypertension   . Shortness of breath   . Peripheral vascular disease   . Claudication of calf muscles 11/05/2012    right calf  . GERD (gastroesophageal reflux disease)   . Arthritis   . TIA (transient ischemic attack) 2010   Past Surgical History:  Past Surgical History  Procedure Laterality Date  . Foot surgery      foot surgery due to broken foot years ago   HPI:  79 yo male adm to [facility] with bilateral leg weakness, neurological symptoms had progressed and wife insisted pt come to ED.  Pt CT head negative initially and then pt presented with flaccid left arm and worsening left leg weakness, repeat CT head stable.  Order for speech evaluation received.     Assessment / Plan / Recommendation Clinical Impression  This very pleasant pt presents with mild dysarthria resulting in decreased speech intelligiblity and facial/labial/hypoglossal nerve impact.  He was oriented x3 except to date (pt states he does not know his home phone number, home address nor keep up with dates at baseline).  Pt demonstrated intact problem solving, judgement and awareness for current environment indicating need to call  for assist due to left side weakness.  Pt also with intact language including semantics, syntax and use given education level.    He states his wife manages home including bill paying, cooking and cleaning.  He takes care of his own medications by using a [employer] pill box unit.  Pt would benefit from brief SLP in acute to establish baseline with family and assure high level cognitive deficits not apparent and address pt's dysarthria.   Skilled intervention included demonstrating compensation strategies to maximize speech intelligiblity using teach back with pt.    Pt agreeable to plan.      SLP Assessment  Patient needs continued Speech Lanaguage Pathology Services    Follow Up Recommendations    TBD   Frequency and Duration min 1 x/week  1 week   Pertinent Vitals/Pain Afebrile, decreased   SLP Goals  SLP Goals Potential to Achieve Goals: Good Potential Considerations: Previous level of function  SLP Evaluation Prior Functioning  Cognitive/Linguistic Baseline: Within functional limits (per pt) Type of Home:  (? mobile home)  Lives With: Spouse Education: "I didn't go to school for too long", pt retired Corporate investment banker Vocation: Retired   Engineer, building services Level: Oriented to person;Oriented to place;Disoriented to time;Oriented to situation Attention: Sustained Sustained Attention: Appears intact Memory: Appears intact (recalled medical events and demo'd prospective memory) Awareness: Appears intact (awareness to left sided weakness and speech changes) Problem Solving: Appears intact (for basic, use of call bell for assist) Safety/Judgment: Appears intact (inability to ambulate independently)    Comprehension  Auditory Comprehension Overall Auditory  Comprehension: Appears within functional limits for tasks assessed Yes/No Questions: Not tested Commands: Impaired Two Step Basic Commands: 75-100% accurate Multistep Basic Commands: 75-100% accurate Conversation:  Complex Interfering Components: Hearing Visual Recognition/Discrimination Discrimination: Not tested Reading Comprehension Reading Status: Not tested (unable to read octopus, states normal and suspect with education background accurate)    Expression Expression Primary Mode of Expression: Verbal Verbal Expression Overall Verbal Expression: Appears within functional limits for tasks assessed Initiation: No impairment Repetition: No impairment (except dysarthriat) Naming: No impairment (for education level) Pragmatics: No impairment Written Expression Dominant Hand: Right Written Expression: Not tested   Oral / Motor Oral Motor/Sensory Function Labial ROM: Reduced left Labial Symmetry: Abnormal symmetry left Labial Strength: Reduced Lingual ROM: Reduced left Lingual Strength: Reduced Facial ROM: Reduced left Facial Symmetry: Left droop;Left drooping eyelid (slight) Facial Strength: Reduced Facial Sensation: Within Functional Limits Velum: Within Functional Limits Mandible: Within Functional Limits Motor Speech Overall Motor Speech: Impaired Respiration: Impaired Level of Impairment: Sentence Phonation: Low vocal intensity Resonance: Within functional limits Articulation: Impaired Intelligibility: Intelligibility reduced Word: 75-100% accurate Phrase: 75-100% accurate Sentence: 50-74% accurate Conversation: 50-74% accurate Motor Planning: Witnin functional limits Effective Techniques: Slow rate;Over-articulate   GO     Curtis Burnet, MS [facility] [place] [place] SLP [phone]

## 2013-07-07 NOTE — Evaluation (Signed)
Occupational Therapy Evaluation Patient Details Name: [name] MRN: [id number] DOB: 18-Oct-1927 Today's Date: 07/07/2013 Time: 7829-5621 OT Time Calculation (min): 31 min  OT Assessment / Plan / Recommendation History of present illness [name] is an 79 y.o. male with a past medical history significant for HTN, DM, atrial fibrillation on coumadin, PVD, TIA, GERD, who was in his usual state of health until this morning when he developed new onset dizziness, imbalance, " tightness of both legs", and speech changes noticed by wife. He said that later on the right arm and leg became weak, " may be around 10 am, but I really don't know". Initially evaluated at [facility] ED. Pt now at [facility] undergoing stroke work up.    Clinical Impression   Pt presents with below problem list. Pt independent with ADLs, PTA. Pt will benefit from acute OT to increase independence prior to d/c. Pt with significant weakness in LUE. Pt requiring two person assist at this time, but feel he will progress well. Recommending CIR for further rehab.     OT Assessment  Patient needs continued OT Services    Follow Up Recommendations  CIR    Barriers to Discharge      Equipment Recommendations  Other (comment) (defer to next venue)    Recommendations for Other Services Rehab consult  Frequency  Min 2X/week    Precautions / Restrictions Precautions Precautions: Fall Restrictions Weight Bearing Restrictions: No   Pertinent Vitals/Pain No pain reported.     ADL  Eating/Feeding: Minimal assistance Where Assessed - Eating/Feeding: Chair Grooming: Moderate assistance Where Assessed - Grooming: Supported sitting Upper Body Bathing: Moderate assistance Where Assessed - Upper Body Bathing: Supported sitting Lower Body Bathing: +2 Total assistance Lower Body Bathing: Patient Percentage: 20% Where Assessed - Lower Body Bathing: Supported sit to stand Upper Body Dressing: Moderate assistance Where Assessed -  Upper Body Dressing: Supported sitting Lower Body Dressing: +2 Total assistance Lower Body Dressing: Patient Percentage: 20% Where Assessed - Lower Body Dressing: Supported sit to Pharmacist, hospital: +2 Total assistance Toilet Transfer: Patient Percentage: 20% (30% for sit <> stand transfers.) Toilet Transfer Method: Sit to stand;Stand pivot Toilet Transfer Equipment: Other (comment) (from bed to recliner chair; sit to stand from bed and chair) Toileting - Clothing Manipulation and Hygiene: +2 Total assistance Toileting - Clothing Manipulation and Hygiene: Patient Percentage: 20% Where Assessed - Toileting Clothing Manipulation and Hygiene: Other (comment) (sit to stand from bed) Tub/Shower Transfer Method: Not assessed Equipment Used: Gait belt Transfers/Ambulation Related to ADLs: +2 Total A (20%) for stand pivot and +2 Total A (30%) for sit <> stand transfers. ADL Comments: Educated on dressing technique and pt did assist with donning underwear. Educated to move left fingers with right hand gently as they were edematous. Educated that weight bearing is beneficial for that RUE as well as encouraged him to try to use it in activities such as stabilizing cup with teeth care. Educated on positioning of RUE.  Pt requiring assistance to doff underwear as he had to urinate-sat to urinate. Pt sat EOB and washed face with no physical assist for balance.      OT Diagnosis: Hemiplegia non-dominant side  OT Problem List: Decreased strength;Decreased activity tolerance;Impaired balance (sitting and/or standing);Decreased knowledge of precautions;Decreased coordination;Decreased knowledge of use of DME or AE;Impaired UE functional use;Increased edema;Impaired sensation OT Treatment Interventions: Self-care/ADL training;Therapeutic exercise;Neuromuscular education;DME and/or AE instruction;Therapeutic activities;Patient/family education;Balance training   OT Goals(Current goals can be found in the care  plan  section) Acute Rehab OT Goals Patient Stated Goal: to be able to walk again OT Goal Formulation: With patient Time For Goal Achievement: 07/14/13 Potential to Achieve Goals: Good  Visit Information  Last OT Received On: 07/07/13 Assistance Needed: +2 PT/OT Co-Evaluation/Treatment: Yes History of Present Illness: [name] is an 79 y.o. male with a past medical history significant for HTN, DM, atrial fibrillation on coumadin, PVD, TIA, GERD, who was in his usual state of health until this morning when he developed new onset dizziness, imbalance, " tightness of both legs", and speech changes noticed by wife. He said that later on the right arm and leg became weak, " may be around 10 am, but I really don't know". Initially evaluated at [facility] ED. Pt now at [facility] undergoing stroke work up.        Prior Functioning     Home Living Family/patient expects to be discharged to:: Private residence Living Arrangements: Spouse/significant other Available Help at Discharge: Family Type of Home: Mobile home Home Access: Stairs to enter Entrance Stairs-Number of Steps: 1 Home Equipment: Gilmer Mor - single point  Lives With: Spouse Prior Function Level of Independence: Independent;Independent with assistive device(s) (occassional use of cane) Communication Communication: No difficulties Dominant Hand: Right         Vision/Perception Vision - History Visual History: Macular degeneration Patient Visual Report: No change from baseline   Cognition  Cognition Arousal/Alertness: Awake/alert Behavior During Therapy: WFL for tasks assessed/performed Overall Cognitive Status: Within Functional Limits for tasks assessed    Extremity/Trunk Assessment Upper Extremity Assessment Upper Extremity Assessment: LUE deficits/detail LUE Deficits / Details: hand flaccid and unable to achieve any AROM shoulder flexion; minimal elbow flexion when arm abducted LUE Sensation: decreased light touch Lower  Extremity Assessment Lower Extremity Assessment: Defer to PT evaluation    Mobility Bed Mobility Bed Mobility: Sitting - Scoot to Edge of Bed;Rolling Right;Right Sidelying to Sit Rolling Right: 4: Min assist Right Sidelying to Sit: 4: Min assist Supine to Sit: 4: Min assist Details for Bed Mobility Assistance: multiple sttempts to perform using strong side with multiple loss of balance back down onto side Transfers Transfers: Sit to Stand;Stand to Sit Sit to Stand: 1: +2 Total assist;From elevated surface;With armrests;From bed;From chair/3-in-1 Sit to Stand: Patient Percentage: 30% Stand to Sit: 1: +2 Total assist;To bed;To chair/3-in-1 Stand to Sit: Patient Percentage: 30% Details for Transfer Assistance: Stand pivot performed with face to face bear hug technique +2 assist using gait belt, LLE buckling requiring complete blocking to control stability.        Balance Balance Balance Assessed: Yes Static Sitting Balance Static Sitting - Balance Support: Feet supported Static Sitting - Level of Assistance: 5: Stand by assistance Static Sitting - Comment/# of Minutes: washed face sitting EOB   End of Session OT - End of Session Equipment Utilized During Treatment: Gait belt Activity Tolerance: Patient tolerated treatment well Patient left: in chair;with call bell/phone within reach;with family/visitor present  GO     Earlie Raveling OTR/L [phone] 07/07/2013, 5:37 PM

## 2013-07-08 DIAGNOSIS — I739 Peripheral vascular disease, unspecified: Secondary | ICD-10-CM

## 2013-07-08 DIAGNOSIS — G459 Transient cerebral ischemic attack, unspecified: Secondary | ICD-10-CM

## 2013-07-08 DIAGNOSIS — I6340 Cerebral infarction due to embolism of unspecified cerebral artery: Secondary | ICD-10-CM

## 2013-07-08 DIAGNOSIS — Z8719 Personal history of other diseases of the digestive system: Secondary | ICD-10-CM

## 2013-07-08 LAB — GLUCOSE, CAPILLARY
Glucose-Capillary: 135 mg/dL — ABNORMAL HIGH (ref 70–99)
Glucose-Capillary: 128 mg/dL — ABNORMAL HIGH (ref 70–99)
Glucose-Capillary: 152 mg/dL — ABNORMAL HIGH (ref 70–99)

## 2013-07-08 NOTE — Progress Notes (Signed)
[facility] PROGRESS NOTE  [name] ZOX:[id number] DOB: [birth year] DOA: 07/06/2013 PCP: Pearson Grippe, MD  Assessment/Plan: 1. CVA - Pt started on xerelto  - INR 2.31 on  07/07/2013 -MRI: Acute lacunar infarct of the right external capsule - posterior corona radiata. No associated mass effect or hemorrhage. -Carotid doppler: The vertebral arteries appear patent with antegrade flow. Findings consistent with 1- 39 percent stenosis involving the right internal carotid artery and the left internal carotid artery. - Neurology signed off and recommend CIR - OT/PT recommend CIR - CIR has evaluated and pt is a good candidate for CIR  2. HTN - Pt on amlodipine, carvedilol - HTN stable range sbp 119-135/ dbp 64-84 for 07/08/2013  3. Atrial fibrillation - At this point rate controlled on carvedilol - On xarelto, coumadin d/c'd 07/07/13  4. PAD - statin on board.  5. DM 2 - Diabetic diet  - SSI - blood sugars relatively well controlled range 135-140 today 07/08/2013   Code Status: full Family Communication: discussed with patient Disposition Plan: Pending placement into CIR   Consultants:  Neurology  CIR  Procedures:  CT of head  Antibiotics:  None  HPI/Subjective: Patient has no new complaints today.  Still complaining of left sided weakness with no improvement compared to yesterday(07/07/13). Denies headache, double vision, or numbness/ tingling to extremities. Pt positive for sensation to BL  arms. Started on xerelto 07/07/13.   Objective: Filed Vitals:   07/08/13 1000  BP: 124/84  Pulse: 100  Temp: 98.1 F (36.7 C)  Resp: 20    Intake/Output Summary (Last 24 hours) at 07/08/13 1216 Last data filed at 07/08/13 0900  Gross per 24 hour  Intake    470 ml  Output   1200 ml  Net   -730 ml   Filed Weights   07/06/13 1752  Weight: 78.6 kg (173 lb 4.5 oz)    Exam:   General:  Pt in NAD, Alert and Awake  Cardiovascular: irregularly irregular,  no Murmurs. Radial and pedal pulses 2+ with no cyanosis or edema  Respiratory: regular unlabored breathing. CTA BL, no wheezes  Abdomen: soft, NT, ND. BS x 4  Musculoskeletal: no clubbing or cyanosis present  Neuro: LUE and LLE weakness when compared to right side. Sensation intact in all extremities   Data Reviewed: Basic Metabolic Panel:  Recent Labs Lab 07/06/13 1300  NA 139  K 4.1  CL 104  CO2 24  GLUCOSE 84  BUN 19  CREATININE 0.91  CALCIUM 9.3   Liver Function Tests: No results found for this basename: AST, ALT, ALKPHOS, BILITOT, PROT, ALBUMIN,  in the last 168 hours No results found for this basename: LIPASE, AMYLASE,  in the last 168 hours No results found for this basename: AMMONIA,  in the last 168 hours CBC:  Recent Labs Lab 07/06/13 1300  WBC 7.3  NEUTROABS 4.2  HGB 13.1  HCT 38.5*  MCV 96.5  PLT 164   Cardiac Enzymes:  Recent Labs Lab 07/06/13 1300 07/06/13 1710  TROPONINI <0.30 <0.30   BNP (last 3 results) No results found for this basename: PROBNP,  in the last 8760 hours CBG:  Recent Labs Lab 07/07/13 0823 07/07/13 1151 07/07/13 1626 07/08/13 0649 07/08/13 1131  GLUCAP 95 128* 144* 135* 140*    No results found for this or any previous visit (from the past 240 hour(s)).   Studies: Dg Chest 2 View  07/06/2013   CLINICAL DATA:  Left-sided weakness and shortness of breath.  Previous smoker.  EXAM: CHEST  2 VIEW  COMPARISON:  04/01/2012.  FINDINGS: Cardiac enlargement. Patchy lower lobe opacities appear improved mild bibasilar scarring. No infiltrates or failure. Calcified tortuous aorta. Negative osseous structures.  IMPRESSION: Slight improvement in aeration. Chronic bronchitic changes. Cardiomegaly with COPD.   Electronically Signed   By: Davonna Belling M.D.   On: 07/06/2013 17:01   Ct Head Wo Contrast  07/07/2013   CLINICAL DATA:  Left-sided weakness.  EXAM: CT HEAD WITHOUT CONTRAST  TECHNIQUE: Contiguous axial images were obtained  from the base of the skull through the vertex without intravenous contrast.  COMPARISON:  Head CT from yesterday  FINDINGS: No acute osseous or soft tissue changes. The orbits show no acute findings.  Prominent extra-axial spaces around the cerebral convexities, left more than right. Unchanged pattern of bilateral deep gray and white matter lacunar infarcts and patchy bilateral cerebral white matter ischemic change. No evidence of acute infarct, hemorrhage, hydrocephalus, or mass lesion.  IMPRESSION: 1. Stable appearance of the brain. No definitive infarct or other acute abnormality. 2. Extensive chronic small vessel ischemic injury.   Electronically Signed   By: Tiburcio Pea M.D.   On: 07/07/2013 02:04   Ct Head Wo Contrast  07/06/2013   *RADIOLOGY REPORT*  Clinical Data: Hypertension, difficulty ambulating  CT HEAD WITHOUT CONTRAST  Technique:  Contiguous axial images were obtained from the base of the skull through the vertex without contrast.  Comparison: 03/23/2012  Findings: Diffuse global brain atrophy noted with prominence of the anterior frontal CSF spaces.  White matter microvascular changes present diffusely.  Chronic bilateral basal ganglia lacunar type infarcts, unchanged.  No acute intracranial hemorrhage, definite acute infarction, mass lesion, mass effect, focal edema, midline shift, herniation, hydrocephalus, or extra-axial fluid collection. No cerebellar abnormality.  Atherosclerosis of the intracranial vessels.  Orbits are symmetric. Minor ethmoid mucosal thickening. Other sinuses clear.  Mastoids clear.  IMPRESSION: Stable brain atrophy, chronic microvascular ischemic change, and basal ganglia lacunar infarcts.  No interval change or acute process by noncontrast CT.   Original Report Authenticated By: Judie Petit. Miles Costain, M.D.   Mr Maxine Glenn Head Wo Contrast  07/07/2013   CLINICAL DATA:  79 year old male with bilateral lower extremity weakness, gait ataxia, and more recent onset of left upper extremity  weakness.  EXAM: MRI HEAD WITHOUT CONTRAST  MRA HEAD WITHOUT CONTRAST  TECHNIQUE: Multiplanar, multiecho pulse sequences of the brain and surrounding structures were obtained without intravenous contrast. Angiographic images of the head were obtained using MRA technique without contrast.  COMPARISON:  Head CT 07/07/2013. Brain MRI 11/03/2008.  FINDINGS: MRI HEAD FINDINGS  Curvilinear and nodular restricted diffusion tracking from the posterior limb of the right external capsule in to the right posterior corona radiata.  This finding is superimposed on widespread chronic T2 heterogeneity of the deep gray matter nuclei and deep white matter capsules which to an extent is related to increased perivascular spaces. However, superimposed scattered chronic lacunar infarcts are suspected (right thalamus).  No associated hemorrhage or mass effect. There may be an additional tiny focus of right parietal lobe white matter involvement (series 3, image 20). No contralateral or posterior fossa restricted diffusion. Major intracranial vascular flow voids are stable with head degree of generalized intracranial artery dolichoectasia.  Chronic decreased cerebral volume is stable. No ventriculomegaly. No midline shift, mass effect, or evidence of intracranial mass lesion. Negative pituitary, cervicomedullary junction and visualized cervical spine. Normal bone marrow signal. Stable extra-axial CSF, fell related to volume loss. No definite extra-axial  collection.  Visualized orbit soft tissues are within normal limits. Stable paranasal sinuses and mastoids. Visualized scalp soft tissues are within normal limits.  MRA HEAD FINDINGS  Antegrade flow in the posterior circulation with mildly dominant distal left vertebral artery. Normal PICA origins. Patent vertebrobasilar junction. Tortuous basilar artery without stenosis. SCA and PCA origins are normal. Diminutive or absent posterior communicating arteries. Bilateral PCA branches are  within normal limits.  Antegrade flow in both ICA siphons. Mild cavernous and supra clinoid ICA irregularity compatible with atherosclerosis, but no associated hemodynamically significant stenosis. Normal ophthalmic artery origins. Patent carotid termini. Normal MCA and ACA origins.  Normal anterior communicating artery and visualized ACA branches. Visualized left MCA branches are within normal limits.  Mild irregularity of the right MCA M1 segment. Still, no right M1 segment stenosis. Patent right MCA bifurcation. Visualized right MCA branches are within normal limits.  IMPRESSION: MRI HEAD IMPRESSION  1. Acute lacunar infarct of the right external capsule - posterior corona radiata. No associated mass effect or hemorrhage.  2. Underlying chronic small vessel disease.  MRA HEAD IMPRESSION  Intracranial artery dolichoectasia. Atherosclerosis of the ICA siphons and probably also the right MCA M1 segment, but no intracranial stenosis or major branch occlusion.   Electronically Signed   By: Augusto Gamble M.D.   On: 07/07/2013 20:43   Mr Brain Wo Contrast  07/07/2013   CLINICAL DATA:  79 year old male with bilateral lower extremity weakness, gait ataxia, and more recent onset of left upper extremity weakness.  EXAM: MRI HEAD WITHOUT CONTRAST  MRA HEAD WITHOUT CONTRAST  TECHNIQUE: Multiplanar, multiecho pulse sequences of the brain and surrounding structures were obtained without intravenous contrast. Angiographic images of the head were obtained using MRA technique without contrast.  COMPARISON:  Head CT 07/07/2013. Brain MRI 11/03/2008.  FINDINGS: MRI HEAD FINDINGS  Curvilinear and nodular restricted diffusion tracking from the posterior limb of the right external capsule in to the right posterior corona radiata.  This finding is superimposed on widespread chronic T2 heterogeneity of the deep gray matter nuclei and deep white matter capsules which to an extent is related to increased perivascular spaces. However,  superimposed scattered chronic lacunar infarcts are suspected (right thalamus).  No associated hemorrhage or mass effect. There may be an additional tiny focus of right parietal lobe white matter involvement (series 3, image 20). No contralateral or posterior fossa restricted diffusion. Major intracranial vascular flow voids are stable with head degree of generalized intracranial artery dolichoectasia.  Chronic decreased cerebral volume is stable. No ventriculomegaly. No midline shift, mass effect, or evidence of intracranial mass lesion. Negative pituitary, cervicomedullary junction and visualized cervical spine. Normal bone marrow signal. Stable extra-axial CSF, fell related to volume loss. No definite extra-axial collection.  Visualized orbit soft tissues are within normal limits. Stable paranasal sinuses and mastoids. Visualized scalp soft tissues are within normal limits.  MRA HEAD FINDINGS  Antegrade flow in the posterior circulation with mildly dominant distal left vertebral artery. Normal PICA origins. Patent vertebrobasilar junction. Tortuous basilar artery without stenosis. SCA and PCA origins are normal. Diminutive or absent posterior communicating arteries. Bilateral PCA branches are within normal limits.  Antegrade flow in both ICA siphons. Mild cavernous and supra clinoid ICA irregularity compatible with atherosclerosis, but no associated hemodynamically significant stenosis. Normal ophthalmic artery origins. Patent carotid termini. Normal MCA and ACA origins.  Normal anterior communicating artery and visualized ACA branches. Visualized left MCA branches are within normal limits.  Mild irregularity of the right MCA M1 segment.  Still, no right M1 segment stenosis. Patent right MCA bifurcation. Visualized right MCA branches are within normal limits.  IMPRESSION: MRI HEAD IMPRESSION  1. Acute lacunar infarct of the right external capsule - posterior corona radiata. No associated mass effect or hemorrhage.   2. Underlying chronic small vessel disease.  MRA HEAD IMPRESSION  Intracranial artery dolichoectasia. Atherosclerosis of the ICA siphons and probably also the right MCA M1 segment, but no intracranial stenosis or major branch occlusion.   Electronically Signed   By: Augusto Gamble M.D.   On: 07/07/2013 20:43    Scheduled Meds: . amLODipine  7.5 mg Oral Daily  . carvedilol  6.25 mg Oral BID WC  . cilostazol  50 mg Oral BID  . insulin aspart  0-9 Units Subcutaneous TID WC  . irbesartan  150 mg Oral Daily  . loratadine  10 mg Oral Daily  . rivaroxaban  20 mg Oral Q supper  . simvastatin  40 mg Oral QPM  . tiotropium  18 mcg Inhalation Daily   Continuous Infusions:   Principal Problem:   CVA (cerebral infarction) Active Problems:   TIA (transient ischemic attack)   PAD (peripheral artery disease)   HTN (hypertension)   Diabetes mellitus, type 2   HLD (hyperlipidemia)   COPD (chronic obstructive pulmonary disease)   Atrial fibrillation   History of GI bleed   Cardiomegaly    Time spent: > 35 minutes     Penny Pia  [facility] Pager [phone]. If 7PM-7AM, please contact night-coverage at [email or web address], password [facility] 07/08/2013, 12:16 PM  LOS: 2 days

## 2013-07-08 NOTE — Progress Notes (Signed)
Rehab admissions - Evaluated for possible admission.  I spoke with patient.  He would like inpatient rehab prior to home with wife.  I have called [employer] and have faxed information to insurance case manager requesting inpatient rehab admission.  I will follow up tomorrow after I hear back from insurance case manager.  Call me for questions.  #[phone]

## 2013-07-08 NOTE — Progress Notes (Signed)
Stroke Team Progress Note  HISTORY [name] is an 79 y.o. male with a past medical history significant for HTN, DM, atrial fibrillation on coumadin,   PVD, TIA, GERD, who was in his usual state of health until this morning when he developed new onset dizziness, imbalance, " tightness of both legs", and speech changes noticed by wife. He said that later on the right arm and leg became weak, " may be around 10 am, but I really don't know". Initially evaluated at [facility] ED.   Denies HA, vertigo, double vision, difficulty swallowing, confusion, or visual disturbances.'  CT brain showed no acute abnormality.   INR 2.06 on admission: patient is on coumadin for atrial fibrillation  Patient was not a TPA candidate secondary to therapeutic INR. He was admitted for further evaluation and treatment.  SUBJECTIVE Patient sitting in room. No new neurological changes. Plans are for CIR.  OBJECTIVE Most recent Vital Signs: Filed Vitals:   07/07/13 1757 07/07/13 2200 07/08/13 0200 07/08/13 0600  BP: 151/85 145/80 119/64 135/74  Pulse: 75 82 90 105  Temp: 98.1 F (36.7 C) 98.3 F (36.8 C) 99.2 F (37.3 C) 99.1 F (37.3 C)  TempSrc: Oral     Resp: 18 20 20 20   Height:      Weight:      SpO2: 96% 97% 97% 95%   CBG (last 3)   Recent Labs  07/07/13 1151 07/07/13 1626 07/08/13 0649  GLUCAP 128* 144* 135*    IV Fluid Intake:     MEDICATIONS  . amLODipine  7.5 mg Oral Daily  . carvedilol  6.25 mg Oral BID WC  . cilostazol  50 mg Oral BID  . insulin aspart  0-9 Units Subcutaneous TID WC  . irbesartan  150 mg Oral Daily  . loratadine  10 mg Oral Daily  . rivaroxaban  20 mg Oral Q supper  . simvastatin  40 mg Oral QPM  . tiotropium  18 mcg Inhalation Daily   PRN:  acetaminophen, acetaminophen  Diet:  Carb Control thin liquids Activity:  Bedrest  DVT Prophylaxis:  INR therapeutic.  CLINICALLY SIGNIFICANT STUDIES Basic Metabolic Panel:   Recent Labs Lab 07/06/13 1300  NA 139   K 4.1  CL 104  CO2 24  GLUCOSE 84  BUN 19  CREATININE 0.91  CALCIUM 9.3   Liver Function Tests: No results found for this basename: AST, ALT, ALKPHOS, BILITOT, PROT, ALBUMIN,  in the last 168 hours CBC:   Recent Labs Lab 07/06/13 1300  WBC 7.3  NEUTROABS 4.2  HGB 13.1  HCT 38.5*  MCV 96.5  PLT 164   Coagulation:   Recent Labs Lab 07/06/13 1310 07/07/13 0500  LABPROT 22.6* 24.6*  INR 2.06* 2.31*   Cardiac Enzymes:   Recent Labs Lab 07/06/13 1300 07/06/13 1710  TROPONINI <0.30 <0.30   Urinalysis:   Recent Labs Lab 07/06/13 1323  COLORURINE YELLOW  LABSPEC 1.018  PHURINE 5.0  GLUCOSEU NEGATIVE  HGBUR NEGATIVE  BILIRUBINUR NEGATIVE  KETONESUR NEGATIVE  PROTEINUR NEGATIVE  UROBILINOGEN 1.0  NITRITE NEGATIVE  LEUKOCYTESUR TRACE*   Lipid Panel    Component Value Date/Time   CHOL 102 07/07/2013 0500   TRIG 79 07/07/2013 0500   HDL 33* 07/07/2013 0500   CHOLHDL 3.1 07/07/2013 0500   VLDL 16 07/07/2013 0500   LDLCALC 53 07/07/2013 0500   HgbA1C  Lab Results  Component Value Date   HGBA1C 5.6 07/06/2013    Urine Drug Screen:  No results found for this basename: labopia,  cocainscrnur,  labbenz,  amphetmu,  thcu,  labbarb    Alcohol Level: No results found for this basename: ETH,  in the last 168 hours  Dg Chest 2 View 07/06/2013   : Slight improvement in aeration. Chronic bronchitic changes. Cardiomegaly with COPD.    07/07/2013   1. Stable appearance of the brain. No definitive infarct or other acute abnormality.    Ct Head Wo Contrast 07/06/2013   Stable brain atrophy, chronic microvascular ischemic change, and basal ganglia lacunar infarcts.  No interval change 07/07/2013   Stable appearance of the brain. No definitive infarct or other acute abnormality. 2. Extensive chronic small vessel ischemic injury.   MRI of the brain    MRA of the brain    2D Echocardiogram  EF 60%, wall motion normal, LA mod-severe dilated. No ASD or PFO  identified.  Carotid Doppler  Bilateral: 1-39% ICA stenosis. Vertebral artery flow is antegrade. Due to calcific plaque at bilateral ICA bulb, higher velocities may be obscured.  EKG  atrial fibrillation.   Therapy Recommendations   Physical Exam   Mental Status:  Alert, oriented, thought content appropriate. Dysarthric.. Able to follow 3 step commands without difficulty.  Cranial Nerves:  II: Discs flat bilaterally; Visual fields grossly normal, pupils equal, round, reactive to light and accommodation  III,IV, VI: ptosis not present, extra-ocular motions intact bilaterally  V,VII: smile symmetric, facial light touch sensation normal bilaterally  VIII: hearing normal bilaterally  IX,X: gag reflex present  XI: bilateral shoulder shrug  XII: midline tongue extension  Motor:  Significant for right HP, arm( 0/5) greater than leg ( 2-3/5) Tone and bulk:normal tone throughout; no atrophy noted  Sensory: Pinprick and light touch intact throughout, bilaterally  Deep Tendon Reflexes:  1 all over  Plantars:  Right: downgoing Left: upgoing  Cerebellar:  Impaired finger to nose and HKS in the left side.  Gait:  No tested.  CV: pulses palpable throughout    ASSESSMENT Mr. [name] is a 79 y.o. male presenting with dizziness, imbalance and right hemiparesis/ataxia. MR Imaging pending. Infarct felt to be embolic secondary to atrial fibrillation.  On warfarin prior to admission. Now on xarelto for secondary stroke prevention as now considered a coumadin failure. Patient with resultant right hemiparesis. Work up underway.   Diabetes mellitus, type 2, HGB A1C, 5.6  Hyperlipidemia, LDL 53, at goal on statin  Atrial fibrillation, xarelto  Hypertension  Peripheral vascular disease  Coumadin failure (had stroke while on)  Hospital day # 2  TREATMENT/PLAN  Xarelto started as INR < 3.0 as per pharmacy  Risk factor modification  Considered a coumadin failure due to  stroke.  CIR recommended  Have patient follow up with Dr. Pearlean Brownie in 2 months in stroke clinic.  Stroke service to sign off.  Gwendolyn Lima. Manson Passey, [facility], MBA, MHA [facility] Pager: [phone] 07/08/2013 10:27 AM  I have personally obtained a history, examined the patient, evaluated imaging results, and formulated the assessment and plan of care. I agree with the above. Delia Heady, MD

## 2013-07-08 NOTE — Progress Notes (Signed)
Physical Therapy Treatment Patient Details Name: [name] MRN: [id number] DOB: 1928/06/02 Today's Date: 07/08/2013 Time: 7829-5621 PT Time Calculation (min): 18 min  PT Assessment / Plan / Recommendation  History of Present Illness [name] is an 79 y.o. male with a past medical history significant for HTN, DM, atrial fibrillation on coumadin, PVD, TIA, GERD, who was in his usual state of health until this morning when he developed new onset dizziness, imbalance, " tightness of both legs", and speech changes noticed by wife. He said that later on the right arm and leg became weak, " may be around 10 am, but I really don't know". Initially evaluated at [facility] ED. Pt now at [facility] undergoing stroke work up.    PT Comments   Patient progressing with standing however still having difficulty with L knee buckling and unable to attempt ambulation. Continue to recommend comprehensive inpatient rehab (CIR) for post-acute therapy needs.   Follow Up Recommendations  CIR;Supervision/Assistance - 24 hour     Does the patient have the potential to tolerate intense rehabilitation     Barriers to Discharge        Equipment Recommendations       Recommendations for Other Services Rehab consult  Frequency Min 3X/week   Progress towards PT Goals Progress towards PT goals: Progressing toward goals  Plan Current plan remains appropriate    Precautions / Restrictions Precautions Precautions: Fall   Pertinent Vitals/Pain denied   Mobility  Bed Mobility Rolling Right: 4: Min assist Right Sidelying to Sit: 4: Min assist Details for Bed Mobility Assistance: A for trunk control and support Transfers Sit to Stand: 1: +2 Total assist;From elevated surface;With armrests;From bed;From chair/3-in-1 Sit to Stand: Patient Percentage: 60% Stand to Sit: 1: +2 Total assist;To chair/3-in-1 Stand to Sit: Patient Percentage: 60% Stand Pivot Transfers: 1: +2 Total assist;From elevated surface Stand  Pivot Transfers: Patient Percentage: 30% Details for Transfer Assistance: A to ensure balance with stand and for patient to acheive full upright posture. Patient with better stand on second attempt. Contiues to have buckling of L LE with any weight.  Ambulation/Gait Ambulation/Gait Assistance: Not tested (comment)    Exercises     PT Diagnosis:    PT Problem List:   PT Treatment Interventions:     PT Goals (current goals can now be found in the care plan section)    Visit Information  Last PT Received On: 07/08/13 Assistance Needed: +2 History of Present Illness: [name] is an 79 y.o. male with a past medical history significant for HTN, DM, atrial fibrillation on coumadin, PVD, TIA, GERD, who was in his usual state of health until this morning when he developed new onset dizziness, imbalance, " tightness of both legs", and speech changes noticed by wife. He said that later on the right arm and leg became weak, " may be around 10 am, but I really don't know". Initially evaluated at [facility] ED. Pt now at [facility] undergoing stroke work up.     Subjective Data      Cognition  Cognition Arousal/Alertness: Awake/alert Behavior During Therapy: WFL for tasks assessed/performed Overall Cognitive Status: Within Functional Limits for tasks assessed    Balance  Static Sitting Balance Static Sitting - Balance Support: Feet supported Static Sitting - Level of Assistance: 5: Stand by assistance  End of Session PT - End of Session Equipment Utilized During Treatment: Gait belt Activity Tolerance: Patient limited by fatigue Patient left: in chair;with call bell/phone within reach;with  family/visitor present   GP     Fredrich Birks 07/08/2013, 11:33 AM 07/08/2013 Fredrich Birks PTA [phone] pager [phone] office

## 2013-07-08 NOTE — Consult Note (Signed)
Physical Medicine and Rehabilitation Consult Reason for Consult: CVA Referring Physician: [facility]   HPI: [name] is a 79 y.o. right-handed male with history of hypertension, TIA, diabetes mellitus with peripheral neuropathy, atrial fibrillation on Coumadin therapy. Patient was independent prior to admission using a cane on occasions. Admitted 07/06/2013 with LEFT- sided weakness and slurred speech. INR on admission of 2.06. MRI of the brain showed acute lacunar infarct of the right external capsule- posterior corona radiata. MRA of the head with atherosclerotic type changes. Echocardiogram with ejection fraction of 60% no wall motion abnormalities without PFO identified. Patient was not a TPA candidate secondary to therapeutic INR. Neurology services consulted Coumadin changed Xarelto. Patient is a regular consistency diet. Physical and occupational therapy evaluations completed 07/07/2013 with recommendations of physical medicine rehabilitation consult to consider inpatient rehabilitation services.  Review of Systems  Respiratory: Positive for shortness of breath.   Cardiovascular: Positive for palpitations.  Gastrointestinal: Positive for constipation.       GERD  Musculoskeletal: Positive for myalgias.  All other systems reviewed and are negative.   Past Medical History  Diagnosis Date  . Diabetes mellitus without complication   . Dysrhythmia     atrial fibrilation  . Hypertension   . Shortness of breath   . Peripheral vascular disease   . Claudication of calf muscles 11/05/2012    right calf  . GERD (gastroesophageal reflux disease)   . Arthritis   . TIA (transient ischemic attack) 2010   Past Surgical History  Procedure Laterality Date  . Foot surgery      foot surgery due to broken foot years ago   History reviewed. No pertinent family history. Social History:  reports that he quit smoking about 31 years ago. His smoking use included Cigarettes. He smoked 0.00 packs  per day. He has never used smokeless tobacco. He reports that he does not drink alcohol or use illicit drugs. Allergies: No Known Allergies Medications Prior to Admission  Medication Sig Dispense Refill  . amLODipine (NORVASC) 5 MG tablet Take 7.5 mg by mouth daily.      . carvedilol (COREG) 6.25 MG tablet Take 6.25 mg by mouth 2 (two) times daily with a meal.      . cilostazol (PLETAL) 50 MG tablet Take 50 mg by mouth 2 (two) times daily.      Marland Kitchen glipiZIDE (GLUCOTROL XL) 5 MG 24 hr tablet Take 5 mg by mouth daily.      Marland Kitchen loratadine (CLARITIN) 10 MG tablet Take 10 mg by mouth daily.      . metFORMIN (GLUCOPHAGE) 1000 MG tablet Take 1 tablet (1,000 mg total) by mouth 2 (two) times daily with a meal.      . Multiple Vitamins-Minerals (PRESERVISION AREDS PO) Take 1 capsule by mouth daily.      Marland Kitchen olmesartan (BENICAR) 40 MG tablet Take 20 mg by mouth daily.       . simvastatin (ZOCOR) 40 MG tablet Take 40 mg by mouth every evening.      . tiotropium (SPIRIVA) 18 MCG inhalation capsule Place 18 mcg into inhaler and inhale daily.      Marland Kitchen warfarin (COUMADIN) 5 MG tablet Take 5 mg by mouth daily.         Home: Home Living Family/patient expects to be discharged to:: Private residence Living Arrangements: Spouse/significant other Available Help at Discharge: Family Type of Home: Mobile home Home Access: Stairs to enter Entrance Stairs-Number of Steps: 1 Home Equipment: Cane - single  point  Lives With: Spouse  Functional History: Prior Function Vocation: Retired Functional Status:  Mobility: Bed Mobility Bed Mobility: Sitting - Scoot to [employer] Right;Right Sidelying to [employer] Right: 4: Min assist Right Sidelying to Sit: 4: Min assist Supine to Sit: 4: Min assist Transfers Transfers: Sit to Stand;Stand to Sit;Stand Pivot Transfers Sit to Stand: 1: +2 Total assist;From elevated surface;With armrests;From bed;From chair/3-in-1 Sit to Stand: Patient Percentage: 30% Stand to  Sit: 1: +2 Total assist;To bed;To chair/3-in-1 Stand to Sit: Patient Percentage: 30% Stand Pivot Transfers: 1: +2 Total assist;From elevated surface Stand Pivot Transfers: Patient Percentage: 20% Ambulation/Gait Ambulation/Gait Assistance: Not tested (comment)    ADL: ADL Eating/Feeding: Minimal assistance Where Assessed - Eating/Feeding: Chair Grooming: Moderate assistance Where Assessed - Grooming: Supported sitting Upper Body Bathing: Moderate assistance Where Assessed - Upper Body Bathing: Supported sitting Lower Body Bathing: +2 Total assistance Where Assessed - Lower Body Bathing: Supported sit to stand Upper Body Dressing: Moderate assistance Where Assessed - Upper Body Dressing: Supported sitting Lower Body Dressing: +2 Total assistance Where Assessed - Lower Body Dressing: Supported sit to Pharmacist, hospital: +2 Total assistance Toilet Transfer Method: Sit to stand;Stand pivot Acupuncturist: Other (comment) (from bed to recliner chair; sit to stand from bed and chair) Tub/Shower Transfer Method: Not assessed Equipment Used: Gait belt Transfers/Ambulation Related to ADLs: +2 Total A (20%) for stand pivot and +2 Total A (30%) for sit <> stand transfers. ADL Comments: Educated on dressing technique and pt did assist with donning underwear. Educated to move left fingers with right hand gently as they were edematous. Educated that weight bearing is beneficial for that RUE as well as encouraged him to try to use it in activities such as stabilizing cup with teeth care. Educated on positioning of RUE.  Pt requiring assistance to doff underwear as he had to urinate.   Cognition: Cognition Overall Cognitive Status: Within Functional Limits for tasks assessed Orientation Level: Oriented X4 Attention: Sustained Sustained Attention: Appears intact Memory: Appears intact (recalled medical events and demo'd prospective memory) Awareness: Appears intact (awareness to  left sided weakness and speech changes) Problem Solving: Appears intact (for basic, use of call bell for assist) Safety/Judgment: Appears intact (inability to ambulate independently) Cognition Arousal/Alertness: Awake/alert Behavior During Therapy: WFL for tasks assessed/performed Overall Cognitive Status: Within Functional Limits for tasks assessed  Blood pressure 119/64, pulse 90, temperature 99.2 F (37.3 C), temperature source Oral, resp. rate 20, height 5\' 9"  (1.753 m), weight 78.6 kg (173 lb 4.5 oz), SpO2 97.00%. Physical Exam  Vitals reviewed. HENT:  Head: Normocephalic.  Eyes: EOM are normal.  Neck: Normal range of motion. Neck supple. No thyromegaly present.  Cardiovascular:  Cardiac rate control  Respiratory: Effort normal and breath sounds normal. No respiratory distress.  GI: Soft. Bowel sounds are normal. He exhibits no distension.  Neurological: He is alert.  Speech is dysarthric but intelligible. He was able to state place, age and date of birth. He did follow simple commands. Displayed fair awareness of his deficits. LUE is 0/5. LLE is 2-HF, KE and APF, 1 to 1+ ADF. Sensation grossly intact throughout the left. Left central 7 and tongue deviation noted  Skin: Skin is warm and dry.  Psychiatric: He has a normal mood and affect. His behavior is normal. Judgment and thought content normal.    Results for orders placed during the hospital encounter of 07/06/13 (from the past 24 hour(s))  GLUCOSE, CAPILLARY     Status:  None   Collection Time    07/07/13  8:23 AM      Result Value Range   Glucose-Capillary 95  70 - 99 mg/dL  GLUCOSE, CAPILLARY     Status: Abnormal   Collection Time    07/07/13 11:51 AM      Result Value Range   Glucose-Capillary 128 (*) 70 - 99 mg/dL   Dg Chest 2 View  11/91/4782   CLINICAL DATA:  Left-sided weakness and shortness of breath. Previous smoker.  EXAM: CHEST  2 VIEW  COMPARISON:  04/01/2012.  FINDINGS: Cardiac enlargement. Patchy lower  lobe opacities appear improved mild bibasilar scarring. No infiltrates or failure. Calcified tortuous aorta. Negative osseous structures.  IMPRESSION: Slight improvement in aeration. Chronic bronchitic changes. Cardiomegaly with COPD.   Electronically Signed   By: Davonna Belling M.D.   On: 07/06/2013 17:01   Ct Head Wo Contrast  07/07/2013   CLINICAL DATA:  Left-sided weakness.  EXAM: CT HEAD WITHOUT CONTRAST  TECHNIQUE: Contiguous axial images were obtained from the base of the skull through the vertex without intravenous contrast.  COMPARISON:  Head CT from yesterday  FINDINGS: No acute osseous or soft tissue changes. The orbits show no acute findings.  Prominent extra-axial spaces around the cerebral convexities, left more than right. Unchanged pattern of bilateral deep gray and white matter lacunar infarcts and patchy bilateral cerebral white matter ischemic change. No evidence of acute infarct, hemorrhage, hydrocephalus, or mass lesion.  IMPRESSION: 1. Stable appearance of the brain. No definitive infarct or other acute abnormality. 2. Extensive chronic small vessel ischemic injury.   Electronically Signed   By: Tiburcio Pea M.D.   On: 07/07/2013 02:04   Ct Head Wo Contrast  07/06/2013   *RADIOLOGY REPORT*  Clinical Data: Hypertension, difficulty ambulating  CT HEAD WITHOUT CONTRAST  Technique:  Contiguous axial images were obtained from the base of the skull through the vertex without contrast.  Comparison: 03/23/2012  Findings: Diffuse global brain atrophy noted with prominence of the anterior frontal CSF spaces.  White matter microvascular changes present diffusely.  Chronic bilateral basal ganglia lacunar type infarcts, unchanged.  No acute intracranial hemorrhage, definite acute infarction, mass lesion, mass effect, focal edema, midline shift, herniation, hydrocephalus, or extra-axial fluid collection. No cerebellar abnormality.  Atherosclerosis of the intracranial vessels.  Orbits are symmetric.  Minor ethmoid mucosal thickening. Other sinuses clear.  Mastoids clear.  IMPRESSION: Stable brain atrophy, chronic microvascular ischemic change, and basal ganglia lacunar infarcts.  No interval change or acute process by noncontrast CT.   Original Report Authenticated By: Judie Petit. Miles Costain, M.D.   Mr Maxine Glenn Head Wo Contrast  07/07/2013   CLINICAL DATA:  79 year old male with bilateral lower extremity weakness, gait ataxia, and more recent onset of left upper extremity weakness.  EXAM: MRI HEAD WITHOUT CONTRAST  MRA HEAD WITHOUT CONTRAST  TECHNIQUE: Multiplanar, multiecho pulse sequences of the brain and surrounding structures were obtained without intravenous contrast. Angiographic images of the head were obtained using MRA technique without contrast.  COMPARISON:  Head CT 07/07/2013. Brain MRI 11/03/2008.  FINDINGS: MRI HEAD FINDINGS  Curvilinear and nodular restricted diffusion tracking from the posterior limb of the right external capsule in to the right posterior corona radiata.  This finding is superimposed on widespread chronic T2 heterogeneity of the deep gray matter nuclei and deep white matter capsules which to an extent is related to increased perivascular spaces. However, superimposed scattered chronic lacunar infarcts are suspected (right thalamus).  No associated hemorrhage or mass  effect. There may be an additional tiny focus of right parietal lobe white matter involvement (series 3, image 20). No contralateral or posterior fossa restricted diffusion. Major intracranial vascular flow voids are stable with head degree of generalized intracranial artery dolichoectasia.  Chronic decreased cerebral volume is stable. No ventriculomegaly. No midline shift, mass effect, or evidence of intracranial mass lesion. Negative pituitary, cervicomedullary junction and visualized cervical spine. Normal bone marrow signal. Stable extra-axial CSF, fell related to volume loss. No definite extra-axial collection.  Visualized  orbit soft tissues are within normal limits. Stable paranasal sinuses and mastoids. Visualized scalp soft tissues are within normal limits.  MRA HEAD FINDINGS  Antegrade flow in the posterior circulation with mildly dominant distal left vertebral artery. Normal PICA origins. Patent vertebrobasilar junction. Tortuous basilar artery without stenosis. SCA and PCA origins are normal. Diminutive or absent posterior communicating arteries. Bilateral PCA branches are within normal limits.  Antegrade flow in both ICA siphons. Mild cavernous and supra clinoid ICA irregularity compatible with atherosclerosis, but no associated hemodynamically significant stenosis. Normal ophthalmic artery origins. Patent carotid termini. Normal MCA and ACA origins.  Normal anterior communicating artery and visualized ACA branches. Visualized left MCA branches are within normal limits.  Mild irregularity of the right MCA M1 segment. Still, no right M1 segment stenosis. Patent right MCA bifurcation. Visualized right MCA branches are within normal limits.  IMPRESSION: MRI HEAD IMPRESSION  1. Acute lacunar infarct of the right external capsule - posterior corona radiata. No associated mass effect or hemorrhage.  2. Underlying chronic small vessel disease.  MRA HEAD IMPRESSION  Intracranial artery dolichoectasia. Atherosclerosis of the ICA siphons and probably also the right MCA M1 segment, but no intracranial stenosis or major branch occlusion.   Electronically Signed   By: Augusto Gamble M.D.   On: 07/07/2013 20:43   Mr Brain Wo Contrast  07/07/2013   CLINICAL DATA:  79 year old male with bilateral lower extremity weakness, gait ataxia, and more recent onset of left upper extremity weakness.  EXAM: MRI HEAD WITHOUT CONTRAST  MRA HEAD WITHOUT CONTRAST  TECHNIQUE: Multiplanar, multiecho pulse sequences of the brain and surrounding structures were obtained without intravenous contrast. Angiographic images of the head were obtained using MRA  technique without contrast.  COMPARISON:  Head CT 07/07/2013. Brain MRI 11/03/2008.  FINDINGS: MRI HEAD FINDINGS  Curvilinear and nodular restricted diffusion tracking from the posterior limb of the right external capsule in to the right posterior corona radiata.  This finding is superimposed on widespread chronic T2 heterogeneity of the deep gray matter nuclei and deep white matter capsules which to an extent is related to increased perivascular spaces. However, superimposed scattered chronic lacunar infarcts are suspected (right thalamus).  No associated hemorrhage or mass effect. There may be an additional tiny focus of right parietal lobe white matter involvement (series 3, image 20). No contralateral or posterior fossa restricted diffusion. Major intracranial vascular flow voids are stable with head degree of generalized intracranial artery dolichoectasia.  Chronic decreased cerebral volume is stable. No ventriculomegaly. No midline shift, mass effect, or evidence of intracranial mass lesion. Negative pituitary, cervicomedullary junction and visualized cervical spine. Normal bone marrow signal. Stable extra-axial CSF, fell related to volume loss. No definite extra-axial collection.  Visualized orbit soft tissues are within normal limits. Stable paranasal sinuses and mastoids. Visualized scalp soft tissues are within normal limits.  MRA HEAD FINDINGS  Antegrade flow in the posterior circulation with mildly dominant distal left vertebral artery. Normal PICA origins. Patent vertebrobasilar junction. Tortuous  basilar artery without stenosis. SCA and PCA origins are normal. Diminutive or absent posterior communicating arteries. Bilateral PCA branches are within normal limits.  Antegrade flow in both ICA siphons. Mild cavernous and supra clinoid ICA irregularity compatible with atherosclerosis, but no associated hemodynamically significant stenosis. Normal ophthalmic artery origins. Patent carotid termini. Normal MCA  and ACA origins.  Normal anterior communicating artery and visualized ACA branches. Visualized left MCA branches are within normal limits.  Mild irregularity of the right MCA M1 segment. Still, no right M1 segment stenosis. Patent right MCA bifurcation. Visualized right MCA branches are within normal limits.  IMPRESSION: MRI HEAD IMPRESSION  1. Acute lacunar infarct of the right external capsule - posterior corona radiata. No associated mass effect or hemorrhage.  2. Underlying chronic small vessel disease.  MRA HEAD IMPRESSION  Intracranial artery dolichoectasia. Atherosclerosis of the ICA siphons and probably also the right MCA M1 segment, but no intracranial stenosis or major branch occlusion.   Electronically Signed   By: Augusto Gamble M.D.   On: 07/07/2013 20:43    Assessment/Plan: Diagnosis: Right EC/IR embolic infarct with left hemiparesis 1. Does the need for close, 24 hr/day medical supervision in concert with the patient's rehab needs make it unreasonable for this patient to be served in a less intensive setting? Yes 2. Co-Morbidities requiring supervision/potential complications: PAD, DM, COPD 3. Due to bladder management, bowel management, safety, skin/wound care, disease management, medication administration, pain management and patient education, does the patient require 24 hr/day rehab nursing? Yes 4. Does the patient require coordinated care of a physician, rehab nurse, PT (1-2 hrs/day, 5 days/week), OT (1-2 hrs/day, 5 days/week) and SLP (1 hrs/day, 5 days/week) to address physical and functional deficits in the context of the above medical diagnosis(es)? Yes Addressing deficits in the following areas: balance, endurance, locomotion, strength, transferring, bowel/bladder control, bathing, dressing, feeding, grooming, toileting, speech and psychosocial support 5. Can the patient actively participate in an intensive therapy program of at least 3 hrs of therapy per day at least 5 days per week?  Yes 6. The potential for patient to make measurable gains while on inpatient rehab is excellent 7. Anticipated functional outcomes upon discharge from inpatient rehab are supervision to mod I with PT, Mod I to min assist with OT, mod I with SLP. 8. Estimated rehab length of stay to reach the above functional goals is: 2-3 weeks 9. Does the patient have adequate social supports to accommodate these discharge functional goals? Yes 10. Anticipated D/C setting: Home 11. Anticipated post D/C treatments: HH therapy--->>outpt therapy 12. Overall Rehab/Functional Prognosis: excellent  RECOMMENDATIONS: This patient's condition is appropriate for continued rehabilitative care in the following setting: CIR Patient has agreed to participate in recommended program. Yes Note that insurance prior authorization may be required for reimbursement for recommended care.  Comment: Rehab RN to follow up.   Ranelle Oyster, MD, [place] [facility]     07/08/2013

## 2013-07-08 NOTE — Care Management Note (Unsigned)
    Page 1 of 1   07/08/2013     1:44:30 PM   CARE MANAGEMENT NOTE 07/08/2013  Patient:  RAINIER, FEUERBORN   Account Number:  [id number]  Date Initiated:  07/08/2013  Documentation initiated by:  Elmer Bales  Subjective/Objective Assessment:   Patient admitted for CVA. Lives at home with wife.     Action/Plan:   Will follow for discharge needs.   Anticipated DC Date:     Anticipated DC Plan:        DC Planning Services  CM consult      Choice offered to / List presented to:             Status of service:   Medicare Important Message given?   (If response is "NO", the following Medicare IM given date fields will be blank) Date Medicare IM given:   Date Additional Medicare IM given:    Discharge Disposition:    Per UR Regulation:    If discussed at Long Length of Stay Meetings, dates discussed:    Comments:  07/08/13 1340 Elmer Bales RN, MSN, CM- Met with patient to discuss Xarelto copay. Per benefits check, patient's copay will be $45/month.  Patient was provided with a copay assistance card from the makers of Xarelto. CM explained that card would have to be activated prior to use.  CM offered to speak with patient's wife when she arrives if she has questions.

## 2013-07-09 LAB — GLUCOSE, CAPILLARY
Glucose-Capillary: 158 mg/dL — ABNORMAL HIGH (ref 70–99)
Glucose-Capillary: 164 mg/dL — ABNORMAL HIGH (ref 70–99)
Glucose-Capillary: 166 mg/dL — ABNORMAL HIGH (ref 70–99)

## 2013-07-09 MED ORDER — CARVEDILOL 12.5 MG PO TABS
12.5000 mg | ORAL_TABLET | Freq: Two times a day (BID) | ORAL | Status: DC
  Filled 2013-07-09 (×2): qty 1

## 2013-07-09 MED ORDER — CARVEDILOL 12.5 MG PO TABS: 12.5000 mg | ORAL_TABLET | Freq: Two times a day (BID) | ORAL | Status: DC

## 2013-07-09 MED ORDER — CARVEDILOL 12.5 MG PO TABS
12.5000 mg | ORAL_TABLET | Freq: Two times a day (BID) | ORAL | Status: DC
  Administered 2013-07-09 (×2): 12.5 mg via ORAL
  Filled 2013-07-09 (×3): qty 1

## 2013-07-09 MED ORDER — RIVAROXABAN 20 MG PO TABS: 20.0000 mg | ORAL_TABLET | Freq: Every day | ORAL | Status: DC

## 2013-07-09 MED ORDER — LIDOCAINE 5 % EX PTCH
1.0000 | MEDICATED_PATCH | CUTANEOUS | Status: DC
  Administered 2013-07-09: 1 via TRANSDERMAL
  Filled 2013-07-09: qty 1

## 2013-07-09 NOTE — Progress Notes (Signed)
Occupational Therapy Treatment Patient Details Name: [name] MRN: [id number] DOB: 1928/04/07 Today's Date: 07/09/2013 Time: 0820-0906 OT Time Calculation (min): 46 min  OT Assessment / Plan / Recommendation  History of present illness [name] is an 79 y.o. male with a past medical history significant for HTN, DM, atrial fibrillation on coumadin, PVD, TIA, GERD, who was in his usual state of health until this morning when he developed new onset dizziness, imbalance, " tightness of both legs", and speech changes noticed by wife. He said that later on the right arm and leg became weak, " may be around 10 am, but I really don't know". Initially evaluated at [facility] ED. Pt now at [facility] undergoing stroke work up.    OT comments  Pt demonstrates shoulder shrug (scapula elevation) this session. Pt reporting diplopia yesterday. Pt has left eye weakness with superior peripheral movement. Pt seeing double with superior left quadrant gaze. Per wife pt with left /eye lid open more today compared to 07/09/13. Pt with new left hemiplegia that makes patient excellent CIR candidate.  Follow Up Recommendations  CIR    Barriers to Discharge       Equipment Recommendations       Recommendations for Other Services Rehab consult  Frequency Min 2X/week   Progress towards OT Goals Progress towards OT goals: Progressing toward goals  Plan Discharge plan remains appropriate    Precautions / Restrictions Precautions Precautions: Fall Precaution Comments: Lt UE subluxation Restrictions Weight Bearing Restrictions: No   Pertinent Vitals/Pain Pain in left knee. Pt reports less pain with stedy use.    ADL  Grooming: Teeth care;Wash/dry face;Minimal assistance Where Assessed - Grooming: Unsupported sitting Upper Body Dressing: Minimal assistance Where Assessed - Upper Body Dressing: Unsupported sitting Toilet Transfer: +2 Total assistance Toilet Transfer: Patient Percentage: 30% Toilet  Transfer Method: Sit to stand Toilet Transfer Equipment: Raised toilet seat with arms (or 3-in-1 over toilet) (using stedy to pivot due to Lt knee pain) Equipment Used: Gait belt;Other (comment) (stedy) Transfers/Ambulation Related to ADLs: pt total +2 with bed elevated this session. Pt c/o pain in the left knee this AM. pt completed sit<>stand x 2 with recliner back facing patient to allow for upright standing with support. Pt demonstrates ability to shift weight in static standing. Pt with Lt UE positioned for proper alignment to allow for weight bearing against recliner with LT UE. Pt needs (A) to guard position and protect Lt UE. pt completed transfer with stedy sit<>stand EOB to chair . Pt reports less discomfort with the leg plate blocking the left LE. pt demonstrates buckling with LT LE. ADL Comments: Pt completed bed mobility with (A). Pt sitting EOB with bedside table to perform oral care. pt needed (A) opening contrainers. pt using Rt Ue only for oral care. pt completed sit<>stand x3 during session and positioned in chair. pt demonstrates Lt scapula elevation with encouragement. pt educated on using the word "try" with therapist to reduce his use of the negative use of "can't" pt at end of session telling MD "i can try to do it". Pt grinning at therapist. Pt and spouse educated on elevation to reduce edema, active scapula elevation x3 during the day and provided stroke booklet with education on "FAST" / signs symptoms of CVA    OT Diagnosis:    OT Problem List:   OT Treatment Interventions:     OT Goals(current goals can now be found in the care plan section) Acute Rehab OT Goals Patient Stated  Goal: to be able to walk again OT Goal Formulation: With patient Time For Goal Achievement: 07/14/13 Potential to Achieve Goals: Good  Visit Information  Last OT Received On: 07/09/13 Assistance Needed: +2 PT/OT Co-Evaluation/Treatment: Yes History of Present Illness: [name] is an 79  y.o. male with a past medical history significant for HTN, DM, atrial fibrillation on coumadin, PVD, TIA, GERD, who was in his usual state of health until this morning when he developed new onset dizziness, imbalance, " tightness of both legs", and speech changes noticed by wife. He said that later on the right arm and leg became weak, " may be around 10 am, but I really don't know". Initially evaluated at [facility] ED. Pt now at [facility] undergoing stroke work up.     Subjective Data      Prior Functioning       Cognition  Cognition Arousal/Alertness: Awake/alert Behavior During Therapy: WFL for tasks assessed/performed Overall Cognitive Status: Within Functional Limits for tasks assessed    Mobility  Bed Mobility Bed Mobility: Supine to Sit;Sitting - Scoot to [place] of Bed;Rolling Right;Right Sidelying to Sit Rolling Right: 4: Min assist Right Sidelying to Sit: 3: Mod assist Supine to Sit: 3: Mod assist;HOB flat;With rails Sitting - Scoot to Edge of Bed: 3: Mod assist (with pad) Details for Bed Mobility Assistance: Pt needed (A) to sequence task and to progress bil LE to EOB. Pt with pad use to scoot toward EOB Transfers Transfers: Sit to Stand;Stand to Sit Sit to Stand: 1: +1 Total assist;From elevated surface;From bed Sit to Stand: Patient Percentage: 60% Stand to Sit: 1: +1 Total assist;With upper extremity assist;To chair/3-in-1 Stand to Sit: Patient Percentage: 60% Transfer via Lift Equipment: Stedy Details for Transfer Assistance: VCs for positioning of LUE for safety, Assist for LE movement, elevation to sitting and Rotation of hips to EOB    Exercises  General Exercises - Lower Extremity Ankle Circles/Pumps: AROM;Both;10 reps;Seated Long Arc Quad: AROM;Both;10 reps (AAROM at [employer])   Games developer Sitting - Balance Support: Feet supported Static Sitting - Level of Assistance: 5: Stand by assistance Static Sitting - Comment/# of Minutes: washed face  sitting EOB Dynamic Standing Balance Dynamic Standing - Balance Support: Bilateral upper extremity supported;During functional activity Dynamic Standing - Level of Assistance: 1: +2 Total assist Dynamic Standing - Balance Activities: Lateral lean/weight shifting;Forward lean/weight shifting Dynamic Standing - Comments: LLE knee blocked in standing, Cues for weight bearing through LUE with scapular control   End of Session OT - End of Session Activity Tolerance: Patient tolerated treatment well Patient left: in chair;with call bell/phone within reach Nurse Communication: Mobility status;Precautions  GO     Harolyn Rutherford 07/09/2013, 11:20 AM Pager: [phone]

## 2013-07-09 NOTE — Clinical Social Work Psychosocial (Signed)
Clinical Social Work Department BRIEF PSYCHOSOCIAL ASSESSMENT 07/09/2013  Patient:  [name], [name]     Account Number:  [id number]     Admit date:  07/06/2013  Clinical Social Worker:  Read Drivers  Date/Time:  07/09/2013 02:19 PM  Referred by:  Physician  Date Referred:  07/09/2013 Referred for  SNF Placement   Other Referral:   denied by insurance for CIR   Interview type:  Other - See comment Other interview type:   CSW spoke with live-in-girlfriend and pt    PSYCHOSOCIAL DATA Living Status:  FAMILY Admitted from facility:   Level of care:   Primary support name:  mable Primary support relationship to patient:  SPOUSE Degree of support available:   adequate    CURRENT CONCERNS Current Concerns  Post-Acute Placement   Other Concerns:   none    SOCIAL WORK ASSESSMENT / PLAN CSW assessed pt at bedside.  CSW introduced self and CSW role.  Pt was alert and oriented though his speech was slurred.  Pt was hoping to be admitted to CIR.  Insurance denied admisison to [facility].  Pt is agreeable for SNF.  Pt request SNF in [facility].    CSW faxed information to SNF.  SNF offered a bed.  Spouse to sign paperwork this afternoon.   Assessment/plan status:  Other - See comment Other assessment/ plan:   none   Information/referral to community resources:   SNF    PATIENT'S/FAMILY'S RESPONSE TO PLAN OF CARE: Pt and spouse is appreciative of CSW assistance and suuport.       Vickii Penna, LCSWA [phone]  Clinical Social Work

## 2013-07-09 NOTE — Progress Notes (Signed)
SLP Cancellation Note  Patient Details Name: [name] MRN: [id number] DOB: [birth year]   Cancelled treatment:        Unable to see pt for dysarthria tx at this time. Pt unavailable. DC today pending. Irineo Gaulin B. Murvin Natal [facility], CCC-SLP [phone]  Leigh Aurora 07/09/2013, 2:52 PM

## 2013-07-09 NOTE — Discharge Summary (Signed)
Physician Discharge Summary  [name] [facility] QMV:[id number] DOB: [birth year] DOA: 07/06/2013  PCP: Pearson Grippe, MD  Admit date: 07/06/2013 Discharge date: 07/09/2013  Time spent: > 35 minutes  Recommendations for Outpatient Follow-up:  1. Patient will need continued physical therapy due to recent stroke 2. Also will need to follow up with neurologist in 2 months or sooner should any new concerns arise.  Discharge Diagnoses:  Principal Problem:   CVA (cerebral infarction) Active Problems:   TIA (transient ischemic attack)   PAD (peripheral artery disease)   HTN (hypertension)   Diabetes mellitus, type 2   HLD (hyperlipidemia)   COPD (chronic obstructive pulmonary disease)   Atrial fibrillation   History of GI bleed   Cardiomegaly   Discharge Condition: Stable  Diet recommendation: Diabetic diet  Filed Weights   07/06/13 1752  Weight: 78.6 kg (173 lb 4.5 oz)    History of present illness:  79 y/o with pmh of macular degeneration, PAD, HTN, DM, atrial fibrillation, h/o PTA and stenting of the right SFA. Presented to the ED complaining of weakness of left upper and left lower extremities.  Hospital Course:  1. CVA - Pt started on xerelto due to stroke despite being therapeutic on coumadin.  -MRI: Acute lacunar infarct of the right external capsule - posterior corona radiata. No associated mass effect or hemorrhage.  -Carotid doppler: The vertebral arteries appear patent with antegrade flow. Findings consistent with 1- 39 percent stenosis involving the right internal carotid artery and the left internal carotid artery. - Neurology signed off and recommend CIR  - OT/PT recommend CIR  - CIR has evaluated. Admission into CIR pending.  2. HTN  - Pt on amlodipine, carvedilol  - HTN stable range sbp 119-135/ dbp 64-84 for 07/08/2013   3. Atrial fibrillation  - At this point rate controlled on carvedilol  - On xarelto, coumadin d/c'd 07/07/13   4. PAD  - statin on board,  will plan on continuing.   5. DM 2  - Diabetic diet  - Plan on continuing home regimen. Last creatinine check was < 1.0  Procedures:  MR/MRA of brain/head  CT of head  Consultations:  Neurology  CIR  Discharge Exam: Filed Vitals:   07/09/13 1010  BP: 105/57  Pulse: 102  Temp: 98.4 F (36.9 C)  Resp: 18    General: Pt in NAD, Alert and awake Cardiovascular: irregularly irregular, no Murmurs Respiratory: CTA BL, no wheezes  Discharge Instructions  Discharge Orders   Future Orders Complete By Expires   Call MD for:  extreme fatigue  As directed    Call MD for:  temperature >100.4  As directed    Diet - low sodium heart healthy  As directed    Increase activity slowly  As directed        Medication List    STOP taking these medications       warfarin 5 MG tablet  Commonly known as:  COUMADIN      TAKE these medications       amLODipine 5 MG tablet  Commonly known as:  NORVASC  Take 7.5 mg by mouth daily.     carvedilol 12.5 MG tablet  Commonly known as:  COREG  Take 1 tablet (12.5 mg total) by mouth 2 (two) times daily with a meal.     cilostazol 50 MG tablet  Commonly known as:  PLETAL  Take 50 mg by mouth 2 (two) times daily.     glipiZIDE 5 MG  24 hr tablet  Commonly known as:  GLUCOTROL XL  Take 5 mg by mouth daily.     loratadine 10 MG tablet  Commonly known as:  CLARITIN  Take 10 mg by mouth daily.     metFORMIN 1000 MG tablet  Commonly known as:  GLUCOPHAGE  Take 1 tablet (1,000 mg total) by mouth 2 (two) times daily with a meal.     olmesartan 40 MG tablet  Commonly known as:  BENICAR  Take 20 mg by mouth daily.     PRESERVISION AREDS PO  Take 1 capsule by mouth daily.     Rivaroxaban 20 MG Tabs tablet  Commonly known as:  XARELTO  Take 1 tablet (20 mg total) by mouth daily with supper.     simvastatin 40 MG tablet  Commonly known as:  ZOCOR  Take 40 mg by mouth every evening.     tiotropium 18 MCG inhalation capsule   Commonly known as:  SPIRIVA  Place 18 mcg into inhaler and inhale daily.       No Known Allergies    The results of significant diagnostics from this hospitalization (including imaging, microbiology, ancillary and laboratory) are listed below for reference.    Significant Diagnostic Studies: Dg Chest 2 View  07/06/2013   CLINICAL DATA:  Left-sided weakness and shortness of breath. Previous smoker.  EXAM: CHEST  2 VIEW  COMPARISON:  04/01/2012.  FINDINGS: Cardiac enlargement. Patchy lower lobe opacities appear improved mild bibasilar scarring. No infiltrates or failure. Calcified tortuous aorta. Negative osseous structures.  IMPRESSION: Slight improvement in aeration. Chronic bronchitic changes. Cardiomegaly with COPD.   Electronically Signed   By: Davonna Belling M.D.   On: 07/06/2013 17:01   Ct Head Wo Contrast  07/07/2013   CLINICAL DATA:  Left-sided weakness.  EXAM: CT HEAD WITHOUT CONTRAST  TECHNIQUE: Contiguous axial images were obtained from the base of the skull through the vertex without intravenous contrast.  COMPARISON:  Head CT from yesterday  FINDINGS: No acute osseous or soft tissue changes. The orbits show no acute findings.  Prominent extra-axial spaces around the cerebral convexities, left more than right. Unchanged pattern of bilateral deep gray and white matter lacunar infarcts and patchy bilateral cerebral white matter ischemic change. No evidence of acute infarct, hemorrhage, hydrocephalus, or mass lesion.  IMPRESSION: 1. Stable appearance of the brain. No definitive infarct or other acute abnormality. 2. Extensive chronic small vessel ischemic injury.   Electronically Signed   By: Tiburcio Pea M.D.   On: 07/07/2013 02:04   Ct Head Wo Contrast  07/06/2013   *RADIOLOGY REPORT*  Clinical Data: Hypertension, difficulty ambulating  CT HEAD WITHOUT CONTRAST  Technique:  Contiguous axial images were obtained from the base of the skull through the vertex without contrast.   Comparison: 03/23/2012  Findings: Diffuse global brain atrophy noted with prominence of the anterior frontal CSF spaces.  White matter microvascular changes present diffusely.  Chronic bilateral basal ganglia lacunar type infarcts, unchanged.  No acute intracranial hemorrhage, definite acute infarction, mass lesion, mass effect, focal edema, midline shift, herniation, hydrocephalus, or extra-axial fluid collection. No cerebellar abnormality.  Atherosclerosis of the intracranial vessels.  Orbits are symmetric. Minor ethmoid mucosal thickening. Other sinuses clear.  Mastoids clear.  IMPRESSION: Stable brain atrophy, chronic microvascular ischemic change, and basal ganglia lacunar infarcts.  No interval change or acute process by noncontrast CT.   Original Report Authenticated By: Judie Petit. Miles Costain, M.D.   Mr Maxine Glenn Head Wo Contrast  07/07/2013  CLINICAL DATA:  79 year old male with bilateral lower extremity weakness, gait ataxia, and more recent onset of left upper extremity weakness.  EXAM: MRI HEAD WITHOUT CONTRAST  MRA HEAD WITHOUT CONTRAST  TECHNIQUE: Multiplanar, multiecho pulse sequences of the brain and surrounding structures were obtained without intravenous contrast. Angiographic images of the head were obtained using MRA technique without contrast.  COMPARISON:  Head CT 07/07/2013. Brain MRI 11/03/2008.  FINDINGS: MRI HEAD FINDINGS  Curvilinear and nodular restricted diffusion tracking from the posterior limb of the right external capsule in to the right posterior corona radiata.  This finding is superimposed on widespread chronic T2 heterogeneity of the deep gray matter nuclei and deep white matter capsules which to an extent is related to increased perivascular spaces. However, superimposed scattered chronic lacunar infarcts are suspected (right thalamus).  No associated hemorrhage or mass effect. There may be an additional tiny focus of right parietal lobe white matter involvement (series 3, image 20). No  contralateral or posterior fossa restricted diffusion. Major intracranial vascular flow voids are stable with head degree of generalized intracranial artery dolichoectasia.  Chronic decreased cerebral volume is stable. No ventriculomegaly. No midline shift, mass effect, or evidence of intracranial mass lesion. Negative pituitary, cervicomedullary junction and visualized cervical spine. Normal bone marrow signal. Stable extra-axial CSF, fell related to volume loss. No definite extra-axial collection.  Visualized orbit soft tissues are within normal limits. Stable paranasal sinuses and mastoids. Visualized scalp soft tissues are within normal limits.  MRA HEAD FINDINGS  Antegrade flow in the posterior circulation with mildly dominant distal left vertebral artery. Normal PICA origins. Patent vertebrobasilar junction. Tortuous basilar artery without stenosis. SCA and PCA origins are normal. Diminutive or absent posterior communicating arteries. Bilateral PCA branches are within normal limits.  Antegrade flow in both ICA siphons. Mild cavernous and supra clinoid ICA irregularity compatible with atherosclerosis, but no associated hemodynamically significant stenosis. Normal ophthalmic artery origins. Patent carotid termini. Normal MCA and ACA origins.  Normal anterior communicating artery and visualized ACA branches. Visualized left MCA branches are within normal limits.  Mild irregularity of the right MCA M1 segment. Still, no right M1 segment stenosis. Patent right MCA bifurcation. Visualized right MCA branches are within normal limits.  IMPRESSION: MRI HEAD IMPRESSION  1. Acute lacunar infarct of the right external capsule - posterior corona radiata. No associated mass effect or hemorrhage.  2. Underlying chronic small vessel disease.  MRA HEAD IMPRESSION  Intracranial artery dolichoectasia. Atherosclerosis of the ICA siphons and probably also the right MCA M1 segment, but no intracranial stenosis or major branch  occlusion.   Electronically Signed   By: Augusto Gamble M.D.   On: 07/07/2013 20:43   Mr Brain Wo Contrast  07/07/2013   CLINICAL DATA:  79 year old male with bilateral lower extremity weakness, gait ataxia, and more recent onset of left upper extremity weakness.  EXAM: MRI HEAD WITHOUT CONTRAST  MRA HEAD WITHOUT CONTRAST  TECHNIQUE: Multiplanar, multiecho pulse sequences of the brain and surrounding structures were obtained without intravenous contrast. Angiographic images of the head were obtained using MRA technique without contrast.  COMPARISON:  Head CT 07/07/2013. Brain MRI 11/03/2008.  FINDINGS: MRI HEAD FINDINGS  Curvilinear and nodular restricted diffusion tracking from the posterior limb of the right external capsule in to the right posterior corona radiata.  This finding is superimposed on widespread chronic T2 heterogeneity of the deep gray matter nuclei and deep white matter capsules which to an extent is related to increased perivascular spaces. However, superimposed scattered  chronic lacunar infarcts are suspected (right thalamus).  No associated hemorrhage or mass effect. There may be an additional tiny focus of right parietal lobe white matter involvement (series 3, image 20). No contralateral or posterior fossa restricted diffusion. Major intracranial vascular flow voids are stable with head degree of generalized intracranial artery dolichoectasia.  Chronic decreased cerebral volume is stable. No ventriculomegaly. No midline shift, mass effect, or evidence of intracranial mass lesion. Negative pituitary, cervicomedullary junction and visualized cervical spine. Normal bone marrow signal. Stable extra-axial CSF, fell related to volume loss. No definite extra-axial collection.  Visualized orbit soft tissues are within normal limits. Stable paranasal sinuses and mastoids. Visualized scalp soft tissues are within normal limits.  MRA HEAD FINDINGS  Antegrade flow in the posterior circulation with mildly  dominant distal left vertebral artery. Normal PICA origins. Patent vertebrobasilar junction. Tortuous basilar artery without stenosis. SCA and PCA origins are normal. Diminutive or absent posterior communicating arteries. Bilateral PCA branches are within normal limits.  Antegrade flow in both ICA siphons. Mild cavernous and supra clinoid ICA irregularity compatible with atherosclerosis, but no associated hemodynamically significant stenosis. Normal ophthalmic artery origins. Patent carotid termini. Normal MCA and ACA origins.  Normal anterior communicating artery and visualized ACA branches. Visualized left MCA branches are within normal limits.  Mild irregularity of the right MCA M1 segment. Still, no right M1 segment stenosis. Patent right MCA bifurcation. Visualized right MCA branches are within normal limits.  IMPRESSION: MRI HEAD IMPRESSION  1. Acute lacunar infarct of the right external capsule - posterior corona radiata. No associated mass effect or hemorrhage.  2. Underlying chronic small vessel disease.  MRA HEAD IMPRESSION  Intracranial artery dolichoectasia. Atherosclerosis of the ICA siphons and probably also the right MCA M1 segment, but no intracranial stenosis or major branch occlusion.   Electronically Signed   By: Augusto Gamble M.D.   On: 07/07/2013 20:43    Microbiology: No results found for this or any previous visit (from the past 240 hour(s)).   Labs: Basic Metabolic Panel:  Recent Labs Lab 07/06/13 1300  NA 139  K 4.1  CL 104  CO2 24  GLUCOSE 84  BUN 19  CREATININE 0.91  CALCIUM 9.3   Liver Function Tests: No results found for this basename: AST, ALT, ALKPHOS, BILITOT, PROT, ALBUMIN,  in the last 168 hours No results found for this basename: LIPASE, AMYLASE,  in the last 168 hours No results found for this basename: AMMONIA,  in the last 168 hours CBC:  Recent Labs Lab 07/06/13 1300  WBC 7.3  NEUTROABS 4.2  HGB 13.1  HCT 38.5*  MCV 96.5  PLT 164   Cardiac  Enzymes:  Recent Labs Lab 07/06/13 1300 07/06/13 1710  TROPONINI <0.30 <0.30   BNP: BNP (last 3 results) No results found for this basename: PROBNP,  in the last 8760 hours CBG:  Recent Labs Lab 07/08/13 0649 07/08/13 1131 07/08/13 1724 07/08/13 2141 07/09/13 0646  GLUCAP 135* 140* 128* 152* 158*       Signed:  Penny Pia  [facility] 07/09/2013, 10:36 AM

## 2013-07-09 NOTE — Progress Notes (Signed)
Physical Therapy Treatment Patient Details Name: [name] MRN: [id number] DOB: 05/06/1928 Today's Date: 07/09/2013 Time: 0817-0905 PT Time Calculation (min): 48 min  PT Assessment / Plan / Recommendation  History of Present Illness [name] is an 79 y.o. male with a past medical history significant for HTN, DM, atrial fibrillation on coumadin, PVD, TIA, GERD, who was in his usual state of health until this morning when he developed new onset dizziness, imbalance, " tightness of both legs", and speech changes noticed by wife. He said that later on the right arm and leg became weak, " may be around 10 am, but I really don't know". Initially evaluated at [facility] ED. Pt now at [facility] undergoing stroke work up.    PT Comments   Patient demonstrates some progress towards PT goals. Patient able to perform some there-ex actively today and perform some standing activities x2 with weight shifting.  Will continue to work with patient and progress activity as tolerated.  Follow Up Recommendations  CIR;Supervision/Assistance - 24 hour     Does the patient have the potential to tolerate intense rehabilitation     Barriers to Discharge        Equipment Recommendations   (TBD)    Recommendations for Other Services Rehab consult  Frequency Min 3X/week   Progress towards PT Goals Progress towards PT goals: Progressing toward goals  Plan Current plan remains appropriate    Precautions / Restrictions Precautions Precautions: Fall Precaution Comments: Lt UE subluxation Restrictions Weight Bearing Restrictions: No   Pertinent Vitals/Pain Reports dizzy feeling and visual deficits     Mobility  Bed Mobility Bed Mobility: Supine to Sit;Sitting - Scoot to [place] of Bed;Rolling Right;Right Sidelying to Sit Rolling Right: 4: Min assist Right Sidelying to Sit: 3: Mod assist Supine to Sit: 3: Mod assist;HOB flat;With rails Sitting - Scoot to Edge of Bed: 3: Mod assist (with pad) Details for Bed  Mobility Assistance: Pt needed (A) to sequence task and to progress bil LE to EOB. Pt with pad use to scoot toward EOB Transfers Transfers: Sit to Stand;Stand to Sit Sit to Stand: 1: +1 Total assist;From elevated surface;From bed Sit to Stand: Patient Percentage: 60% Stand to Sit: 1: +1 Total assist;With upper extremity assist;To chair/3-in-1 Stand to Sit: Patient Percentage: 60% Transfer via Lift Equipment: Stedy Details for Transfer Assistance: VCs for positioning of LUE for safety, Assist for LE movement, elevation to sitting and Rotation of hips to EOB Ambulation/Gait Ambulation/Gait Assistance: Not tested (comment)    Exercises General Exercises - Lower Extremity Ankle Circles/Pumps: AROM;Both;10 reps;Seated Long Arc Quad: AROM;Both;10 reps (AAROM at [employer])     PT Goals (current goals can now be found in the care plan section) Acute Rehab PT Goals Patient Stated Goal: to be able to walk again PT Goal Formulation: With patient Time For Goal Achievement: 07/21/13 Potential to Achieve Goals: Fair  Visit Information  Last PT Received On: 07/09/13 Assistance Needed: +2 History of Present Illness: [name] is an 79 y.o. male with a past medical history significant for HTN, DM, atrial fibrillation on coumadin, PVD, TIA, GERD, who was in his usual state of health until this morning when he developed new onset dizziness, imbalance, " tightness of both legs", and speech changes noticed by wife. He said that later on the right arm and leg became weak, " may be around 10 am, but I really don't know". Initially evaluated at [facility] ED. Pt now at [facility] undergoing stroke work up.  Subjective Data  Patient Stated Goal: to be able to walk again   Cognition  Cognition Arousal/Alertness: Awake/alert Behavior During Therapy: WFL for tasks assessed/performed Overall Cognitive Status: Within Functional Limits for tasks assessed    Balance  Static Sitting Balance Static Sitting -  Balance Support: Feet supported Static Sitting - Level of Assistance: 5: Stand by assistance Static Sitting - Comment/# of Minutes: washed face sitting EOB Dynamic Standing Balance Dynamic Standing - Balance Support: Bilateral upper extremity supported;During functional activity Dynamic Standing - Level of Assistance: 1: +2 Total assist Dynamic Standing - Balance Activities: Lateral lean/weight shifting;Forward lean/weight shifting Dynamic Standing - Comments: LLE knee blocked in standing, Cues for weight bearing through LUE with scapular control  End of Session PT - End of Session Equipment Utilized During Treatment: Gait belt Activity Tolerance: Patient limited by fatigue Patient left: in chair;with call bell/phone within reach;with family/visitor present Nurse Communication: Mobility status;Need for lift equipment   GP     [name] 07/09/2013, 10:44 AM Charlotte Crumb, PT DPT  [phone]

## 2013-07-09 NOTE — Progress Notes (Signed)
Rehab admissions - I have received a denial from [facility].  If MD wishes to appeal, the attending will need to contact the insurance case manager to set up a peer to peer with the [employer].  Call me for questions.  #[phone]

## 2013-07-09 NOTE — Clinical Social Work Note (Signed)
CSW called for transport.  FL2 is signed and packet is on chart.  Unit RN and MD aware.  Vickii Penna, LCSWA [phone]  Clinical Social Work

## 2013-07-09 NOTE — Progress Notes (Signed)
At the said time, report was called on patient to Joy an Rn at the facility. Family was also notified about patient being discharged to [facility] later in the evening and that, we are waiting on [facility] for transportation. Assessments remained unchanged as at now.

## 2013-07-09 NOTE — Clinical Social Work Placement (Signed)
Clinical Social Work Department CLINICAL SOCIAL WORK PLACEMENT NOTE 07/09/2013  Patient:  [name], [name]  Account Number:  [id number] Admit date:  07/06/2013  Clinical Social Worker:  Read Drivers  Date/time:  07/09/2013 02:33 PM  Clinical Social Work is seeking post-discharge placement for this patient at the following level of care:   SKILLED NURSING   (*CSW will update this form in Epic as items are completed)   07/09/2013  Patient/family provided with [facility] Department of Clinical Social Work's list of facilities offering this level of care within the geographic area requested by the patient (or if unable, by the patient's family).  07/09/2013  Patient/family informed of their freedom to choose among providers that offer the needed level of care, that participate in Medicare, Medicaid or managed care program needed by the patient, have an available bed and are willing to accept the patient.  07/09/2013  Patient/family informed of [facility]' ownership interest in [facility], as well as of the fact that they are under no obligation to receive care at this facility.  PASARR submitted to EDS on 07/09/2013 PASARR number received from EDS on 07/09/2013  FL2 transmitted to all facilities in geographic area requested by pt/family on  07/09/2013 FL2 transmitted to all facilities within larger geographic area on   Patient informed that his/her managed care company has contracts with or will negotiate with  certain facilities, including the following:     Patient/family informed of bed offers received:  07/09/2013 Patient chooses bed at [place] Physician recommends and patient chooses bed at    Patient to be transferred to [place], [facility] on  07/09/2013 Patient to be transferred to facility by [facility] - EMS  The following physician request were entered in Epic:   Additional Comments:  Vickii Penna, LCSWA [phone]  Clinical Social Work

## 2013-10-23 ENCOUNTER — Encounter (HOSPITAL_BASED_OUTPATIENT_CLINIC_OR_DEPARTMENT_OTHER): Payer: Self-pay | Admitting: Emergency Medicine

## 2013-10-23 ENCOUNTER — Emergency Department (HOSPITAL_BASED_OUTPATIENT_CLINIC_OR_DEPARTMENT_OTHER): Payer: Medicare Other

## 2013-10-23 ENCOUNTER — Emergency Department (HOSPITAL_BASED_OUTPATIENT_CLINIC_OR_DEPARTMENT_OTHER)
Admission: EM | Admit: 2013-10-23 | Discharge: 2013-10-23 | Disposition: A | Discharge status: Home / Self Care | Payer: Medicare Other | Attending: Emergency Medicine | Admitting: Emergency Medicine

## 2013-10-23 DIAGNOSIS — I4891 Unspecified atrial fibrillation: Secondary | ICD-10-CM | POA: Insufficient documentation

## 2013-10-23 DIAGNOSIS — M129 Arthropathy, unspecified: Secondary | ICD-10-CM | POA: Insufficient documentation

## 2013-10-23 DIAGNOSIS — E119 Type 2 diabetes mellitus without complications: Secondary | ICD-10-CM | POA: Insufficient documentation

## 2013-10-23 DIAGNOSIS — IMO0002 Reserved for concepts with insufficient information to code with codable children: Secondary | ICD-10-CM | POA: Insufficient documentation

## 2013-10-23 DIAGNOSIS — S2242XA Multiple fractures of ribs, left side, initial encounter for closed fracture: Secondary | ICD-10-CM

## 2013-10-23 DIAGNOSIS — W010XXA Fall on same level from slipping, tripping and stumbling without subsequent striking against object, initial encounter: Secondary | ICD-10-CM | POA: Insufficient documentation

## 2013-10-23 DIAGNOSIS — Y9389 Activity, other specified: Secondary | ICD-10-CM | POA: Insufficient documentation

## 2013-10-23 DIAGNOSIS — I10 Essential (primary) hypertension: Secondary | ICD-10-CM | POA: Insufficient documentation

## 2013-10-23 DIAGNOSIS — Z7901 Long term (current) use of anticoagulants: Secondary | ICD-10-CM | POA: Insufficient documentation

## 2013-10-23 DIAGNOSIS — S2249XA Multiple fractures of ribs, unspecified side, initial encounter for closed fracture: Secondary | ICD-10-CM | POA: Insufficient documentation

## 2013-10-23 DIAGNOSIS — Z79899 Other long term (current) drug therapy: Secondary | ICD-10-CM | POA: Insufficient documentation

## 2013-10-23 DIAGNOSIS — Z8673 Personal history of transient ischemic attack (TIA), and cerebral infarction without residual deficits: Secondary | ICD-10-CM | POA: Insufficient documentation

## 2013-10-23 DIAGNOSIS — Z87891 Personal history of nicotine dependence: Secondary | ICD-10-CM | POA: Insufficient documentation

## 2013-10-23 DIAGNOSIS — Y929 Unspecified place or not applicable: Secondary | ICD-10-CM | POA: Insufficient documentation

## 2013-10-23 MED ORDER — OXYCODONE-ACETAMINOPHEN 5-325 MG PO TABS
1.0000 | ORAL_TABLET | Freq: Once | ORAL | Status: AC
  Administered 2013-10-23: 1 via ORAL
  Filled 2013-10-23: qty 1

## 2013-10-23 MED ORDER — OXYCODONE-ACETAMINOPHEN 5-325 MG PO TABS: 1.0000 | ORAL_TABLET | Freq: Four times a day (QID) | ORAL | Status: DC | PRN

## 2013-10-23 MED ORDER — HYDROCODONE-ACETAMINOPHEN 5-325 MG PO TABS: 1.0000 | ORAL_TABLET | Freq: Once | ORAL | Status: DC

## 2013-10-23 NOTE — ED Provider Notes (Signed)
CSN: [id number]     Arrival date & time 10/23/13  1942 History  This chart was scribed for Curtis B. Karle Starch, MD by Roe Coombs, ED Scribe. The patient was seen in room MH03/MH03. Patient's care was started at 8:33 PM.    Chief Complaint  Patient presents with  . Fall  . Rib Injury    The history is provided by the patient. No language interpreter was used.    HPI Comments: [name] is a 80 y.o. male with history of stroke in 2010 and 2014 who presents to the Emergency Department complaining of a fall after losing his balance earlier today. Patient's family member states that his grandson was trying to release his dog from his cage, so the patient tried to kick the cage open with his left leg, but he fell and hit his back against a desk. Patient says he developed left mid back pain after falling. He has taken Tylenol for pain with mild relief. He denies hitting his head or loss of consciousness. Patient ambulates with a walker. He has home health care and lives with his family. Patient also has a medical history of DM, atrial fibrillation (treated with Xarelto), HTN, GERD.   Past Medical History  Diagnosis Date  . Diabetes mellitus without complication   . Dysrhythmia     atrial fibrilation  . Hypertension   . Shortness of breath   . Peripheral vascular disease   . Claudication of calf muscles 11/05/2012    right calf  . GERD (gastroesophageal reflux disease)   . Arthritis   . TIA (transient ischemic attack) 2010   Past Surgical History  Procedure Laterality Date  . Foot surgery      foot surgery due to broken foot years ago   History reviewed. No pertinent family history. History  Substance Use Topics  . Smoking status: Former Smoker    Types: Cigarettes    Quit date: 09/23/1981  . Smokeless tobacco: Never Used  . Alcohol Use: No     Comment: QUITS YEARS AGO    Review of Systems A complete 10 system review of systems was obtained and all systems are negative  except as noted in the HPI and PMH.   Allergies  Review of patient's allergies indicates no known allergies.  Home Medications   Current Outpatient Rx  Name  Route  Sig  Dispense  Refill  . amLODipine (NORVASC) 5 MG tablet   Oral   Take 7.5 mg by mouth daily.         . carvedilol (COREG) 12.5 MG tablet   Oral   Take 1 tablet (12.5 mg total) by mouth 2 (two) times daily with a meal.   60 tablet   0   . cilostazol (PLETAL) 50 MG tablet   Oral   Take 50 mg by mouth 2 (two) times daily.         Marland Kitchen glipiZIDE (GLUCOTROL XL) 5 MG 24 hr tablet   Oral   Take 5 mg by mouth daily.         Marland Kitchen loratadine (CLARITIN) 10 MG tablet   Oral   Take 10 mg by mouth daily.         . metFORMIN (GLUCOPHAGE) 1000 MG tablet   Oral   Take 1 tablet (1,000 mg total) by mouth 2 (two) times daily with a meal.         . Multiple Vitamins-Minerals (PRESERVISION AREDS PO)   Oral   Take  1 capsule by mouth daily.         . Rivaroxaban (XARELTO) 20 MG TABS tablet   Oral   Take 1 tablet (20 mg total) by mouth daily with supper.   30 tablet   0   . simvastatin (ZOCOR) 40 MG tablet   Oral   Take 40 mg by mouth every evening.         . tiotropium (SPIRIVA) 18 MCG inhalation capsule   Inhalation   Place 18 mcg into inhaler and inhale daily.         Marland Kitchen olmesartan (BENICAR) 40 MG tablet   Oral   Take 20 mg by mouth daily.           Triage Vitals: BP 125/64  Pulse 97  Temp(Src) 98.2 F (36.8 C) (Oral)  Resp 18  Ht 5\' 8"  (1.727 m)  Wt 155 lb (70.308 kg)  BMI 23.57 kg/m2  SpO2 95% Physical Exam  Nursing note and vitals reviewed. Constitutional: He is oriented to person, place, and time. He appears well-developed and well-nourished.  HENT:  Head: Normocephalic and atraumatic.  Eyes: EOM are normal. Pupils are equal, round, and reactive to light.  Neck: Normal range of motion. Neck supple.  Cardiovascular: Normal rate, normal heart sounds and intact distal pulses.    Pulmonary/Chest: Effort normal and breath sounds normal. No respiratory distress. He has no wheezes. He has no rales.  Tender over left lower posterior ribs. Superficial abrasion.   Abdominal: Bowel sounds are normal. He exhibits no distension. There is no tenderness.  Musculoskeletal: Normal range of motion. He exhibits no edema and no tenderness.  Neurological: He is alert and oriented to person, place, and time. He has normal strength. No cranial nerve deficit or sensory deficit.  Skin: Skin is warm and dry. No rash noted.  Psychiatric: He has a normal mood and affect.    ED Course  Procedures (including critical care time) DIAGNOSTIC STUDIES: Oxygen Saturation is 95% on room air, adequate by my interpretation.    COORDINATION OF CARE: 8:38 PM- Patient informed of current plan for treatment and evaluation and agrees with plan at this time.    Labs Review Labs Reviewed - No data to display  Imaging Review Dg Ribs Unilateral W/chest Left  10/23/2013   CLINICAL DATA:  Status post fall  EXAM: LEFT RIBS AND CHEST - 3+ VIEW  COMPARISON:  None.  FINDINGS: There are minimally displaced fractures of the left seventh and eighth lateral ribs. There is a small left pleural effusion. There is no pneumothorax. The lungs are hyperinflated likely secondary to COPD. Normal cardiomediastinal silhouette.  IMPRESSION: Minimally displaced fractures of the left seventh and eighth lateral ribs with a small left pleural effusion. No pneumothorax.   Electronically Signed   By: Kathreen Devoid   On: 10/23/2013 20:24    EKG Interpretation   None       MDM   1. Fracture of two ribs of left side     Mechanical fall, posterior rib fractures on xray. Pt relatively comfortable while sitting up, but painful when he is lying down. Pt and family comfortable with him at home. He uses a walker for ambulation. Advised incentive spirometry, pain meds as needed and return for worsening pain, SOB, fever, cough, or for  any other concerns.   I personally performed the services described in this documentation, which was scribed in my presence. The recorded information has been reviewed and is accurate.  Curtis B. Karle Starch, MD 10/23/13 2052

## 2013-10-23 NOTE — Patient Instructions (Signed)
Instructed patient and wife of patient on the proper use of an IS. Patient able to achieve 1500X 5. Patient tolerated well.

## 2013-10-23 NOTE — ED Notes (Addendum)
Pt states he had a fall today, reports pain to "left ribcage" area - abrasion to left side of back, skin discoloration noted, pt reports he is recovering from a stroke, sling to left arm, pt is on Xarelto. Denies hitting head. Pt states he lost his balance and fell. Pt walks with a walker, has home health care and lives with family at home.

## 2013-10-23 NOTE — Discharge Instructions (Signed)
Rib Fracture  A rib fracture is a break or crack in one of the bones of the ribs. The ribs are a group of long, curved bones that wrap around your chest and attach to your spine. They protect your lungs and other organs in the chest cavity. A broken or cracked rib is often painful, but most do not cause other problems. Most rib fractures heal on their own over time. However, rib fractures can be more serious if multiple ribs are broken or if broken ribs move out of place and push against other structures.  CAUSES   · A direct blow to the chest. For example, this could happen during contact sports, a car accident, or a fall against a hard object.  · Repetitive movements with high force, such as pitching a baseball or having severe coughing spells.  SYMPTOMS   · Pain when you breathe in or cough.  · Pain when someone presses on the injured area.  DIAGNOSIS   Your caregiver will perform a physical exam. Various imaging tests may be ordered to confirm the diagnosis and to look for related injuries. These tests may include a chest X-ray, computed tomography (CT), magnetic resonance imaging (MRI), or a bone scan.  TREATMENT   Rib fractures usually heal on their own in 1 3 months. The longer healing period is often associated with a continued cough or other aggravating activities. During the healing period, pain control is very important. Medication is usually given to control pain. Hospitalization or surgery may be needed for more severe injuries, such as those in which multiple ribs are broken or the ribs have moved out of place.   HOME CARE INSTRUCTIONS   · Avoid strenuous activity and any activities or movements that cause pain. Be careful during activities and avoid bumping the injured rib.  · Gradually increase activity as directed by your caregiver.  · Only take over-the-counter or prescription medications as directed by your caregiver. Do not take other medications without asking your caregiver first.  · Apply ice  to the injured area for the first 1 2 days after you have been treated or as directed by your caregiver. Applying ice helps to reduce inflammation and pain.  · Put ice in a plastic bag.  · Place a towel between your skin and the bag.    · Leave the ice on for 15 20 minutes at a time, every 2 hours while you are awake.  · Perform deep breathing as directed by your caregiver. This will help prevent pneumonia, which is a common complication of a broken rib. Your caregiver may instruct you to:  · Take deep breaths several times a day.  · Try to cough several times a day, holding a pillow against the injured area.  · Use a device called an incentive spirometer to practice deep breathing several times a day.  · Drink enough fluids to keep your urine clear or pale yellow. This will help you avoid constipation.    · Do not wear a rib belt or binder. These restrict breathing, which can lead to pneumonia.    SEEK IMMEDIATE MEDICAL CARE IF:   · You have a fever.    · You have difficulty breathing or shortness of breath.    · You develop a continual cough, or you cough up thick or bloody sputum.  · You feel sick to your stomach (nausea), throw up (vomit), or have abdominal pain.    · You have worsening pain not controlled with medications.      MAKE SURE YOU:  · Understand these instructions.  · Will watch your condition.  · Will get help right away if you are not doing well or get worse.  Document Released: 09/09/2005 Document Revised: 05/12/2013 Document Reviewed: 11/11/2012  [employer]® Patient Information ©2014 [employer].

## 2013-10-26 NOTE — ED Notes (Signed)
Pt caregiver called and sts pt bruise is now yellow and "puffy". Advised yellow is normal bruise healing but puffy cannot be assessed over the phone.

## 2013-10-31 ENCOUNTER — Encounter (HOSPITAL_COMMUNITY): Payer: Self-pay | Admitting: Emergency Medicine

## 2013-10-31 ENCOUNTER — Emergency Department (HOSPITAL_COMMUNITY): Payer: Medicare Other

## 2013-10-31 ENCOUNTER — Inpatient Hospital Stay (HOSPITAL_COMMUNITY): Payer: Medicare Other

## 2013-10-31 ENCOUNTER — Inpatient Hospital Stay (HOSPITAL_COMMUNITY)
Admission: EM | Admit: 2013-10-31 | Discharge: 2013-11-03 | DRG: 194 | Disposition: A | Discharge status: Home Health | Payer: Medicare Other | Attending: Internal Medicine | Admitting: Internal Medicine

## 2013-10-31 DIAGNOSIS — K921 Melena: Secondary | ICD-10-CM | POA: Diagnosis present

## 2013-10-31 DIAGNOSIS — IMO0001 Reserved for inherently not codable concepts without codable children: Secondary | ICD-10-CM

## 2013-10-31 DIAGNOSIS — I739 Peripheral vascular disease, unspecified: Secondary | ICD-10-CM

## 2013-10-31 DIAGNOSIS — I959 Hypotension, unspecified: Secondary | ICD-10-CM | POA: Diagnosis present

## 2013-10-31 DIAGNOSIS — J189 Pneumonia, unspecified organism: Principal | ICD-10-CM | POA: Diagnosis present

## 2013-10-31 DIAGNOSIS — Z87891 Personal history of nicotine dependence: Secondary | ICD-10-CM

## 2013-10-31 DIAGNOSIS — J438 Other emphysema: Secondary | ICD-10-CM | POA: Diagnosis present

## 2013-10-31 DIAGNOSIS — Z7901 Long term (current) use of anticoagulants: Secondary | ICD-10-CM

## 2013-10-31 DIAGNOSIS — E119 Type 2 diabetes mellitus without complications: Secondary | ICD-10-CM | POA: Diagnosis present

## 2013-10-31 DIAGNOSIS — I6350 Cerebral infarction due to unspecified occlusion or stenosis of unspecified cerebral artery: Secondary | ICD-10-CM

## 2013-10-31 DIAGNOSIS — K219 Gastro-esophageal reflux disease without esophagitis: Secondary | ICD-10-CM | POA: Diagnosis present

## 2013-10-31 DIAGNOSIS — E875 Hyperkalemia: Secondary | ICD-10-CM | POA: Diagnosis present

## 2013-10-31 DIAGNOSIS — Z8719 Personal history of other diseases of the digestive system: Secondary | ICD-10-CM

## 2013-10-31 DIAGNOSIS — G459 Transient cerebral ischemic attack, unspecified: Secondary | ICD-10-CM

## 2013-10-31 DIAGNOSIS — I10 Essential (primary) hypertension: Secondary | ICD-10-CM | POA: Diagnosis present

## 2013-10-31 DIAGNOSIS — I69959 Hemiplegia and hemiparesis following unspecified cerebrovascular disease affecting unspecified side: Secondary | ICD-10-CM

## 2013-10-31 DIAGNOSIS — Z79899 Other long term (current) drug therapy: Secondary | ICD-10-CM

## 2013-10-31 DIAGNOSIS — D62 Acute posthemorrhagic anemia: Secondary | ICD-10-CM | POA: Diagnosis present

## 2013-10-31 DIAGNOSIS — J449 Chronic obstructive pulmonary disease, unspecified: Secondary | ICD-10-CM | POA: Diagnosis present

## 2013-10-31 DIAGNOSIS — I639 Cerebral infarction, unspecified: Secondary | ICD-10-CM

## 2013-10-31 DIAGNOSIS — I517 Cardiomegaly: Secondary | ICD-10-CM

## 2013-10-31 DIAGNOSIS — E785 Hyperlipidemia, unspecified: Secondary | ICD-10-CM | POA: Diagnosis present

## 2013-10-31 DIAGNOSIS — I4891 Unspecified atrial fibrillation: Secondary | ICD-10-CM | POA: Diagnosis present

## 2013-10-31 LAB — COMPREHENSIVE METABOLIC PANEL
ALBUMIN: 3.4 g/dL — AB (ref 3.5–5.2)
ALK PHOS: 102 U/L (ref 39–117)
ALT: 16 U/L (ref 0–53)
AST: 18 U/L (ref 0–37)
BUN: 29 mg/dL — AB (ref 6–23)
CALCIUM: 9.2 mg/dL (ref 8.4–10.5)
CO2: 23 mEq/L (ref 19–32)
CREATININE: 1.01 mg/dL (ref 0.50–1.35)
Chloride: 101 mEq/L (ref 96–112)
GFR calc non Af Amer: 67 mL/min — ABNORMAL LOW (ref >90)
GFR, EST AFRICAN AMERICAN: 77 mL/min — AB (ref >90)
Glucose, Bld: 94 mg/dL (ref 70–99)
POTASSIUM: 5.4 meq/L — AB (ref 3.7–5.3)
Sodium: 138 mEq/L (ref 137–147)
TOTAL PROTEIN: 7.4 g/dL (ref 6.0–8.3)
Total Bilirubin: 0.7 mg/dL (ref 0.3–1.2)

## 2013-10-31 LAB — URINE MICROSCOPIC-ADD ON

## 2013-10-31 LAB — POCT I-STAT TROPONIN I: TROPONIN I, POC: 0 ng/mL (ref 0.00–0.08)

## 2013-10-31 LAB — URINALYSIS, ROUTINE W REFLEX MICROSCOPIC
BILIRUBIN URINE: NEGATIVE
Glucose, UA: NEGATIVE mg/dL
HGB URINE DIPSTICK: NEGATIVE
Ketones, ur: 15 mg/dL — AB
Nitrite: NEGATIVE
PH: 5 (ref 5.0–8.0)
Protein, ur: NEGATIVE mg/dL
Specific Gravity, Urine: 1.019 (ref 1.005–1.030)
Urobilinogen, UA: 1 mg/dL (ref 0.0–1.0)

## 2013-10-31 LAB — TYPE AND SCREEN
ABO/RH(D): O POS
ANTIBODY SCREEN: NEGATIVE

## 2013-10-31 LAB — HEMOGLOBIN AND HEMATOCRIT, BLOOD
HEMATOCRIT: 30.3 % — AB (ref 39.0–52.0)
Hemoglobin: 10.5 g/dL — ABNORMAL LOW (ref 13.0–17.0)

## 2013-10-31 LAB — CBC
HEMATOCRIT: 29.1 % — AB (ref 39.0–52.0)
HEMOGLOBIN: 9.8 g/dL — AB (ref 13.0–17.0)
MCH: 33.1 pg (ref 26.0–34.0)
MCHC: 33.7 g/dL (ref 30.0–36.0)
MCV: 98.3 fL (ref 78.0–100.0)
Platelets: 266 10*3/uL (ref 150–400)
RBC: 2.96 MIL/uL — AB (ref 4.22–5.81)
RDW: 13.5 % (ref 11.5–15.5)
WBC: 7.1 10*3/uL (ref 4.0–10.5)

## 2013-10-31 LAB — GLUCOSE, CAPILLARY: Glucose-Capillary: 86 mg/dL (ref 70–99)

## 2013-10-31 LAB — OCCULT BLOOD, POC DEVICE: Fecal Occult Bld: POSITIVE — AB

## 2013-10-31 LAB — CG4 I-STAT (LACTIC ACID): Lactic Acid, Venous: 0.82 mmol/L (ref 0.5–2.2)

## 2013-10-31 MED ORDER — ACETAMINOPHEN 650 MG RE SUPP: 650.0000 mg | Freq: Four times a day (QID) | RECTAL | Status: DC | PRN

## 2013-10-31 MED ORDER — CARVEDILOL 12.5 MG PO TABS
12.5000 mg | ORAL_TABLET | Freq: Two times a day (BID) | ORAL | Status: DC
  Administered 2013-10-31 – 2013-11-03 (×5): 12.5 mg via ORAL
  Filled 2013-10-31 (×8): qty 1

## 2013-10-31 MED ORDER — SODIUM CHLORIDE 0.9 % IV SOLN
INTRAVENOUS | Status: DC
  Administered 2013-10-31: 1000 mL via INTRAVENOUS
  Administered 2013-11-01: 09:00:00 via INTRAVENOUS
  Administered 2013-11-02: 75 mL/h via INTRAVENOUS
  Administered 2013-11-02: 500 mL via INTRAVENOUS

## 2013-10-31 MED ORDER — SODIUM CHLORIDE 0.9 % IV BOLUS (SEPSIS)
1000.0000 mL | Freq: Once | INTRAVENOUS | Status: AC
  Administered 2013-10-31: 1000 mL via INTRAVENOUS

## 2013-10-31 MED ORDER — ACETAMINOPHEN 325 MG PO TABS
650.0000 mg | ORAL_TABLET | Freq: Four times a day (QID) | ORAL | Status: DC | PRN
  Administered 2013-11-01: 650 mg via ORAL
  Filled 2013-10-31: qty 2

## 2013-10-31 MED ORDER — PANTOPRAZOLE SODIUM 40 MG PO TBEC
40.0000 mg | DELAYED_RELEASE_TABLET | Freq: Every day | ORAL | Status: DC
  Administered 2013-10-31 – 2013-11-02 (×3): 40 mg via ORAL
  Filled 2013-10-31 (×3): qty 1

## 2013-10-31 MED ORDER — ALBUTEROL SULFATE (2.5 MG/3ML) 0.083% IN NEBU: 2.5000 mg | INHALATION_SOLUTION | Freq: Four times a day (QID) | RESPIRATORY_TRACT | Status: DC | PRN

## 2013-10-31 MED ORDER — SODIUM CHLORIDE 0.9 % IJ SOLN
3.0000 mL | Freq: Two times a day (BID) | INTRAMUSCULAR | Status: DC
  Administered 2013-11-03: 3 mL via INTRAVENOUS

## 2013-10-31 MED ORDER — ONDANSETRON HCL 4 MG/2ML IJ SOLN: 4.0000 mg | Freq: Four times a day (QID) | INTRAMUSCULAR | Status: DC | PRN

## 2013-10-31 MED ORDER — INSULIN ASPART 100 UNIT/ML ~~LOC~~ SOLN
0.0000 [IU] | Freq: Three times a day (TID) | SUBCUTANEOUS | Status: DC
  Administered 2013-11-03 (×2): 1 [IU] via SUBCUTANEOUS

## 2013-10-31 MED ORDER — SIMVASTATIN 40 MG PO TABS
40.0000 mg | ORAL_TABLET | Freq: Every evening | ORAL | Status: DC
Start: 2013-10-31 — End: 2013-11-03
  Administered 2013-10-31 – 2013-11-02 (×3): 40 mg via ORAL
  Filled 2013-10-31 (×4): qty 1

## 2013-10-31 MED ORDER — ONDANSETRON HCL 4 MG PO TABS: 4.0000 mg | ORAL_TABLET | Freq: Four times a day (QID) | ORAL | Status: DC | PRN

## 2013-10-31 MED ORDER — DEXTROSE 5 % IV SOLN
1.0000 g | Freq: Once | INTRAVENOUS | Status: AC
  Administered 2013-10-31: 1 g via INTRAVENOUS
  Filled 2013-10-31: qty 10

## 2013-10-31 MED ORDER — LORATADINE 10 MG PO TABS
10.0000 mg | ORAL_TABLET | Freq: Every day | ORAL | Status: DC
  Administered 2013-11-01 – 2013-11-03 (×3): 10 mg via ORAL
  Filled 2013-10-31 (×3): qty 1

## 2013-10-31 MED ORDER — DEXTROSE 5 % IV SOLN
500.0000 mg | INTRAVENOUS | Status: DC
  Administered 2013-11-01: 500 mg via INTRAVENOUS
  Filled 2013-10-31 (×3): qty 500

## 2013-10-31 MED ORDER — DEXTROSE 5 % IV SOLN
500.0000 mg | Freq: Once | INTRAVENOUS | Status: AC
  Administered 2013-10-31: 15:00:00 via INTRAVENOUS

## 2013-10-31 MED ORDER — DEXTROSE 5 % IV SOLN
1.0000 g | INTRAVENOUS | Status: DC
  Administered 2013-11-01 – 2013-11-02 (×2): 1 g via INTRAVENOUS
  Filled 2013-10-31 (×3): qty 10

## 2013-10-31 MED ORDER — TIOTROPIUM BROMIDE MONOHYDRATE 18 MCG IN CAPS
18.0000 ug | ORAL_CAPSULE | Freq: Every day | RESPIRATORY_TRACT | Status: DC
  Administered 2013-11-01 – 2013-11-03 (×2): 18 ug via RESPIRATORY_TRACT
  Filled 2013-10-31: qty 5

## 2013-10-31 NOTE — ED Provider Notes (Signed)
CSN: [id number]     Arrival date & time 10/31/13  1103 History   First MD Initiated Contact with Patient 10/31/13 1130     Chief Complaint  Patient presents with  . Hypotension   (Consider location/radiation/quality/duration/timing/severity/associated sxs/prior Treatment) Patient is a 80 y.o. male presenting with general illness.  Illness Location:  Diffuse Quality:  Hypotension Severity:  Moderate Onset quality:  Sudden Progression:  Resolved Chronicity:  New Context:  Wife checked BP this morning, low Relieved by:  Nothing Worsened by:  Nothing Ineffective treatments:  None tried Associated symptoms: nausea (did not eat his breakfast this morning)   Associated symptoms: no abdominal pain, no cough, no fever, no shortness of breath and no vomiting     Past Medical History  Diagnosis Date  . Diabetes mellitus without complication   . Dysrhythmia     atrial fibrilation  . Hypertension   . Shortness of breath   . Peripheral vascular disease   . Claudication of calf muscles 11/05/2012    right calf  . GERD (gastroesophageal reflux disease)   . Arthritis   . TIA (transient ischemic attack) 2010   Past Surgical History  Procedure Laterality Date  . Foot surgery      foot surgery due to broken foot years ago   History reviewed. No pertinent family history. History  Substance Use Topics  . Smoking status: Former Smoker    Types: Cigarettes    Quit date: 09/23/1981  . Smokeless tobacco: Never Used  . Alcohol Use: No     Comment: QUITS YEARS AGO    Review of Systems  Constitutional: Negative for fever.  Respiratory: Negative for cough and shortness of breath.   Gastrointestinal: Positive for nausea (did not eat his breakfast this morning). Negative for vomiting and abdominal pain.  All other systems reviewed and are negative.    Allergies  Review of patient's allergies indicates no known allergies.  Home Medications   Current Outpatient Rx  Name  Route  Sig   Dispense  Refill  . amLODipine (NORVASC) 5 MG tablet   Oral   Take 7.5 mg by mouth daily.         . carvedilol (COREG) 12.5 MG tablet   Oral   Take 1 tablet (12.5 mg total) by mouth 2 (two) times daily with a meal.   60 tablet   0   . cilostazol (PLETAL) 50 MG tablet   Oral   Take 50 mg by mouth 2 (two) times daily.         Marland Kitchen glipiZIDE (GLUCOTROL XL) 5 MG 24 hr tablet   Oral   Take 5 mg by mouth daily.         Marland Kitchen loratadine (CLARITIN) 10 MG tablet   Oral   Take 10 mg by mouth daily.         . metFORMIN (GLUCOPHAGE) 1000 MG tablet   Oral   Take 1 tablet (1,000 mg total) by mouth 2 (two) times daily with a meal.         . Multiple Vitamins-Minerals (PRESERVISION AREDS PO)   Oral   Take 1 capsule by mouth daily.         Marland Kitchen olmesartan (BENICAR) 40 MG tablet   Oral   Take 20 mg by mouth daily.          Marland Kitchen oxyCODONE-acetaminophen (PERCOCET/ROXICET) 5-325 MG per tablet   Oral   Take 1-2 tablets by mouth every 6 (six) hours as needed for  severe pain.   20 tablet   0   . Rivaroxaban (XARELTO) 20 MG TABS tablet   Oral   Take 1 tablet (20 mg total) by mouth daily with supper.   30 tablet   0   . simvastatin (ZOCOR) 40 MG tablet   Oral   Take 40 mg by mouth every evening.         . tiotropium (SPIRIVA) 18 MCG inhalation capsule   Inhalation   Place 18 mcg into inhaler and inhale daily.          BP 113/77  Pulse 89  Temp(Src) 98.1 F (36.7 C) (Oral)  Resp 20  Ht 5\' 10"  (1.778 m)  SpO2 95% Physical Exam  Nursing note and vitals reviewed. Constitutional: He is oriented to person, place, and time. He appears well-developed and well-nourished. No distress.  HENT:  Head: Normocephalic and atraumatic.  Mouth/Throat: No oropharyngeal exudate.  Eyes: EOM are normal. Pupils are equal, round, and reactive to light.  Neck: Normal range of motion. Neck supple.  Cardiovascular: Normal rate and regular rhythm.  Exam reveals no friction rub.   No murmur  heard. Pulmonary/Chest: Effort normal and breath sounds normal. No respiratory distress. He has no wheezes. He has no rales.  Abdominal: He exhibits no distension. There is no tenderness. There is no rebound.  Musculoskeletal: Normal range of motion. He exhibits no edema.  Neurological: He is alert and oriented to person, place, and time.  Skin: He is not diaphoretic.    ED Course  Procedures (including critical care time) Labs Review Labs Reviewed  COMPREHENSIVE METABOLIC PANEL - Abnormal; Notable for the following:    Potassium 5.4 (*)    BUN 29 (*)    Albumin 3.4 (*)    GFR calc non Af Amer 67 (*)    GFR calc Af Amer 77 (*)    All other components within normal limits  CBC - Abnormal; Notable for the following:    RBC 2.96 (*)    Hemoglobin 9.8 (*)    HCT 29.1 (*)    All other components within normal limits  URINALYSIS, ROUTINE W REFLEX MICROSCOPIC  CG4 I-STAT (LACTIC ACID)  POCT I-STAT TROPONIN I   Imaging Review Dg Chest 2 View  10/31/2013   CLINICAL DATA:  Hypotension.  Diabetes.  Asthma.  EXAM: CHEST  2 VIEW  COMPARISON:  DG RIBS UNILATERAL W/CHEST*L* dated 10/23/2013  FINDINGS: Lateral view degraded by patient positioning, including the arms overlying anterior chest. Midline trachea. Cardiomegaly with atherosclerosis in the transverse aorta. Small left pleural effusion is increased since the prior. No pneumothorax. A right lower lobe calcified granuloma. Similar right base volume loss. Worsened left base aeration. Underlying hyperinflation.  IMPRESSION: Worsened left base aeration with increased pleural fluid and adjacent airspace disease. The airspace disease is suspicious for infection or aspiration. Consider short term radiographic followup.  Underline hyperinflation.  Cardiomegaly without congestive failure.   Electronically Signed   By: Abigail Miyamoto M.D.   On: 10/31/2013 13:07    EKG Interpretation    Date/Time:  Sunday October 31 2013 12:27:33 EST Ventricular Rate:   98 PR Interval:    QRS Duration: 65 QT Interval:  388 QTC Calculation: 495 R Axis:   70 Text Interpretation:  Atrial fibrillation Ventricular premature complex Anterior infarct, old Similar to prior Confirmed by [facility]  MD, [place] ([id number]) on 10/31/2013 12:31:40 PM            MDM   1. Pneumonia  2. Atrial fibrillation   3. CAP (community acquired pneumonia)   4. CVA (cerebral infarction)   5. Diabetes mellitus, type 2   6. History of GI bleed   7. HTN (hypertension)   8. TIA (transient ischemic attack)    55M presents with concerns for hypotension. I checked his blood pressure this morning prior blood pressure medications and after getting up and taking a shower and he had a systolic in the high 83T. He did not have a diastolic reading. There has been with normal blood pressures. He did not take his blood pressure meds this morning after he was found to be hypotensive. Like to not recheck it at home. She stated he had some nausea and then eating this morning. He denies feeling bad. He denies having difficulty breathing or coughing. He has multiple comorbidities so we will check labs and chest x-ray look for possible sources of infection. Chest x-ray shows fluid of, underaeration concerning for pneumonia plus minus pleural effusion. His BUN is elevated at 29 his hemoglobin is low at 9.8. Previous hemoglobins have been above 11. Will check Hemoccult, start broad-spectrum antibiotics, fluids and admit. CURB65 is elevated due to increased BUN, age. Admitted to medicine.   Osvaldo Shipper, MD 10/31/13 2025

## 2013-10-31 NOTE — ED Notes (Signed)
Report given to [name] on 3W-- will attempt to get chest CT prior to transport

## 2013-10-31 NOTE — ED Notes (Signed)
Pt denies any complaints. He states his wife brought him in today because his BP was low when she checked it at home this morning.

## 2013-10-31 NOTE — ED Notes (Signed)
DR.Walden shown results of Istat Lactic Acid. ED-Lab

## 2013-10-31 NOTE — Progress Notes (Signed)
ANTIBIOTIC CONSULT NOTE - INITIAL  Pharmacy Consult for Ceftriaxone Indication: pneumonia  No Known Allergies  Patient Measurements: Height: 5\' 10"  (177.8 cm) IBW/kg (Calculated) : 73 Adjusted Body Weight:   Vital Signs: Temp: 98.1 F (36.7 C) (02/08 1333) Temp src: Oral (02/08 1333) BP: 112/61 mmHg (02/08 1555) Pulse Rate: 91 (02/08 1555) Intake/Output from previous day:   Intake/Output from this shift: Total I/O In: -  Out: 300 [Urine:300]  Labs:  Recent Labs  10/31/13 1227  WBC 7.1  HGB 9.8*  PLT 266  CREATININE 1.01   The CrCl is unknown because both a height and weight (above a minimum accepted value) are required for this calculation. No results found for this basename: VANCOTROUGH, VANCOPEAK, VANCORANDOM, GENTTROUGH, GENTPEAK, GENTRANDOM, TOBRATROUGH, TOBRAPEAK, TOBRARND, AMIKACINPEAK, AMIKACINTROU, AMIKACIN,  in the last 72 hours   Microbiology: No results found for this or any previous visit (from the past 720 hour(s)).  Medical History: Past Medical History  Diagnosis Date  . Diabetes mellitus without complication   . Dysrhythmia     atrial fibrilation  . Hypertension   . Shortness of breath   . Peripheral vascular disease   . Claudication of calf muscles 11/05/2012    right calf  . GERD (gastroesophageal reflux disease)   . Arthritis   . TIA (transient ischemic attack) 2010    Medications:  Prescriptions prior to admission  Medication Sig Dispense Refill  . amLODipine (NORVASC) 5 MG tablet Take 7.5 mg by mouth daily.      . carvedilol (COREG) 12.5 MG tablet Take 1 tablet (12.5 mg total) by mouth 2 (two) times daily with a meal.  60 tablet  0  . cilostazol (PLETAL) 50 MG tablet Take 50 mg by mouth 2 (two) times daily.      Marland Kitchen glipiZIDE (GLUCOTROL XL) 5 MG 24 hr tablet Take 5 mg by mouth daily.      Marland Kitchen loratadine (CLARITIN) 10 MG tablet Take 10 mg by mouth daily.      . metFORMIN (GLUCOPHAGE) 1000 MG tablet Take 1 tablet (1,000 mg total) by  mouth 2 (two) times daily with a meal.      . Multiple Vitamins-Minerals (PRESERVISION AREDS PO) Take 1 capsule by mouth daily.      Marland Kitchen olmesartan (BENICAR) 40 MG tablet Take 60 mg by mouth daily.       . Rivaroxaban (XARELTO) 20 MG TABS tablet Take 1 tablet (20 mg total) by mouth daily with supper.  30 tablet  0  . simvastatin (ZOCOR) 40 MG tablet Take 40 mg by mouth every evening.      . tiotropium (SPIRIVA) 18 MCG inhalation capsule Place 18 mcg into inhaler and inhale daily.       Assessment: 85yom to start ceftriaxone and azithromycin for suspected PNA. Pharmacy has been consulted to dose ceftriaxone. Patient received ceftriaxone 1g  in ED ~ 1430. Will start ceftriaxone 1g IV daily and pharmacy will sign-off as no dose adjustments are required. - Afeb, WBC wnl - Crcl ~55  Plan:  1. Ceftriaxone 1g IV q24h 2. Pharmacy will sign-off. Please reconsult if additional assistance is needed.   [place], [place] [phone] 10/31/2013,5:05 PM

## 2013-10-31 NOTE — ED Notes (Signed)
Contacted floor- RN not available for rpt at this time- Darliss Ridgel will call when ready to receive pt.

## 2013-10-31 NOTE — H&P (Addendum)
[facility] History and Physical  [name]:[id number] DOB: 11-02-1927 DOA: 10/31/2013  Referring physician:  PCP: [name], MD  Specialists:   Chief Complaint: sob, productive cough   HPI: [name] is a 80 y.o. male with PMH of HTN, HPL, DM, a fib on xarelto, h/o CVA who had recent fall/rib fracture (1/31) presented today with SOB, productive cough, chills, fever fount to have PNA;  denies chest pain, but still has residual pain related to recent rib fracture; denies nausea, vomiting, or diarrhea; has residual L sided mild hemiplegia due to old CVA  Review of Systems: The patient denies anorexia,  weight loss,, vision loss, decreased hearing, hoarseness, chest pain, syncope,  peripheral edema, balance deficits, hemoptysis, abdominal pain, melena, hematochezia, severe indigestion/heartburn, hematuria, incontinence, genital sores, muscle weakness, suspicious skin lesions, transient blindness, difficulty walking, depression, unusual weight change, abnormal bleeding, enlarged lymph nodes, angioedema, and breast masses.    Past Medical History  Diagnosis Date  . Diabetes mellitus without complication   . Dysrhythmia     atrial fibrilation  . Hypertension   . Shortness of breath   . Peripheral vascular disease   . Claudication of calf muscles 11/05/2012    right calf  . GERD (gastroesophageal reflux disease)   . Arthritis   . TIA (transient ischemic attack) 2010   Past Surgical History  Procedure Laterality Date  . Foot surgery      foot surgery due to broken foot years ago   Social History:  reports that he quit smoking about 32 years ago. His smoking use included Cigarettes. He smoked 0.00 packs per day. He has never used smokeless tobacco. He reports that he does not drink alcohol or use illicit drugs. Home; ; where does patient live--home, ALF, SNF? and with whom if at home? No;  Can patient participate in ADLs?  No Known Allergies  History reviewed.  No pertinent family history. no h/o CAD (be sure to complete)  Prior to Admission medications   Medication Sig Start Date End Date Taking? Authorizing Provider  amLODipine (NORVASC) 5 MG tablet Take 7.5 mg by mouth daily.   Yes Historical Provider, MD  carvedilol (COREG) 12.5 MG tablet Take 1 tablet (12.5 mg total) by mouth 2 (two) times daily with a meal. 07/09/13  Yes Velvet Bathe, MD  cilostazol (PLETAL) 50 MG tablet Take 50 mg by mouth 2 (two) times daily.    Historical Provider, MD  glipiZIDE (GLUCOTROL XL) 5 MG 24 hr tablet Take 5 mg by mouth daily.    Historical Provider, MD  loratadine (CLARITIN) 10 MG tablet Take 10 mg by mouth daily.    Historical Provider, MD  metFORMIN (GLUCOPHAGE) 1000 MG tablet Take 1 tablet (1,000 mg total) by mouth 2 (two) times daily with a meal. 11/09/12   Laverda Page, MD  Multiple Vitamins-Minerals (PRESERVISION AREDS PO) Take 1 capsule by mouth daily.    Historical Provider, MD  olmesartan (BENICAR) 40 MG tablet Take 20 mg by mouth daily.     Historical Provider, MD  oxyCODONE-acetaminophen (PERCOCET/ROXICET) 5-325 MG per tablet Take 1-2 tablets by mouth every 6 (six) hours as needed for severe pain. 10/23/13   [name] Starch, MD  Rivaroxaban (XARELTO) 20 MG TABS tablet Take 1 tablet (20 mg total) by mouth daily with supper. 07/09/13   Velvet Bathe, MD  simvastatin (ZOCOR) 40 MG tablet Take 40 mg by mouth every evening.    Historical Provider, MD  tiotropium (SPIRIVA) 18 MCG  inhalation capsule Place 18 mcg into inhaler and inhale daily.    Historical Provider, MD   Physical Exam: Filed Vitals:   10/31/13 1333  BP: 113/77  Pulse: 89  Temp: 98.1 F (36.7 C)  Resp: 20     General:  Alert, mild confused  Eyes: eom-i, perrla   ENT: no oral ulcers   Neck: supple   Cardiovascular: s1,s2 rrr; L sided lateral chest hematoma   Respiratory: decreased AE in LL  Abdomen: soft, nt, nd   Skin: echymosis   Musculoskeletal: no LE edema    Psychiatric: no hallucinations   Neurologic: confusion likely baseline, MCI; CN 2-12 intact; L sided weakness   Labs on Admission:  Basic Metabolic Panel:  Recent Labs Lab 10/31/13 1227  NA 138  K 5.4*  CL 101  CO2 23  GLUCOSE 94  BUN 29*  CREATININE 1.01  CALCIUM 9.2   Liver Function Tests:  Recent Labs Lab 10/31/13 1227  AST 18  ALT 16  ALKPHOS 102  BILITOT 0.7  PROT 7.4  ALBUMIN 3.4*   No results found for this basename: LIPASE, AMYLASE,  in the last 168 hours No results found for this basename: AMMONIA,  in the last 168 hours CBC:  Recent Labs Lab 10/31/13 1227  WBC 7.1  HGB 9.8*  HCT 29.1*  MCV 98.3  PLT 266   Cardiac Enzymes: No results found for this basename: CKTOTAL, CKMB, CKMBINDEX, TROPONINI,  in the last 168 hours  BNP (last 3 results) No results found for this basename: PROBNP,  in the last 8760 hours CBG: No results found for this basename: GLUCAP,  in the last 168 hours  Radiological Exams on Admission: Dg Chest 2 View  10/31/2013   CLINICAL DATA:  Hypotension.  Diabetes.  Asthma.  EXAM: CHEST  2 VIEW  COMPARISON:  DG RIBS UNILATERAL W/CHEST*L* dated 10/23/2013  FINDINGS: Lateral view degraded by patient positioning, including the arms overlying anterior chest. Midline trachea. Cardiomegaly with atherosclerosis in the transverse aorta. Small left pleural effusion is increased since the prior. No pneumothorax. A right lower lobe calcified granuloma. Similar right base volume loss. Worsened left base aeration. Underlying hyperinflation.  IMPRESSION: Worsened left base aeration with increased pleural fluid and adjacent airspace disease. The airspace disease is suspicious for infection or aspiration. Consider short term radiographic followup.  Underline hyperinflation.  Cardiomegaly without congestive failure.   Electronically Signed   By: Abigail Miyamoto M.D.   On: 10/31/2013 13:07    EKG: Independently reviewed.  A fib    Assessment/Plan Principal Problem:   Pneumonia Active Problems:   HTN (hypertension)   COPD (chronic obstructive pulmonary disease)   Atrial fibrillation   History of GI bleed  80 y.o. male with PMH of HTN, HPL, DM, a fib on xarelto, h/o CVA who had recent fall/rib fracture (1/31) presented today with SOB, productive cough, chills, fever fount to have PNA  1. CAP; CXR: L sided infiltrate effusion;  -started IV atx, IVF, bronchodilators; oxygen; pend cultures -obtain CT chest for better evaluation of effusion r/o hemothorax with recent rib fracture    2. Acute anemia likely blood loss +hemoccult; r/o hemothorax; patient denies no obvious GIB but + occult blood  -h/o GIB (2010) no obvious sources, s/p  EGD: ? Healed PUD, colonoscopy hemorrhoids;  -hold xarelto, monitor Hg, type screen, TF prn; consulted GI to evaluate for anticoagulation treatment; may need repeat endoscopy      3. A fib chronic; HR controlled -cont BB,  hold xarelto with acute blood loss anemia; d/w agreed with patient, his wife; they understand the risk of CVA vs risk of bleeding  4. Rib fracture; left seventh and eighth lateral ribs; cont pain; r/o hemothorax pend CT chest   5. H/o CVA with L sided mild residual hemiplegia; holding xarelto; pend GI  Eval;   6. MCI, confusion likely baseline; cont monitoring   7. DM last HA1C-5.6; ISS while inpatient   8. Hyper K mild ?related to ARB; hodl ABR; check K in AM  9. HTN cont BB, hold amlodipine, ARB due to borderline BP   DVT prophylaxis, hold heparin, SCD for now due to bleeding  GI;  if consultant consulted, please document name and whether formally or informally consulted  Code Status: full (must indicate code status--if unknown or must be presumed, indicate so) Family Communication: d/w patient, his wife (indicate person spoken with, if applicable, with phone number if by telephone) Disposition Plan: pend clinical improvement  (indicate anticipated  LOS)  Time spent: >35 minutes   [place], [facility] Pager [phone]  If 7PM-7AM, please contact night-coverage [email or web address] Password [facility] 10/31/2013, 2:44 PM

## 2013-10-31 NOTE — ED Notes (Signed)
Report received from [place], [place]. Pt alert/oriented x 3-- family at bedside.

## 2013-10-31 NOTE — ED Notes (Signed)
Pt fell 1 week ago with rib FX x 2 on right side.  Pt uses walker at home to ambulate.  Hx CVA with LUE weakness/decreased mobility.

## 2013-11-01 DIAGNOSIS — I517 Cardiomegaly: Secondary | ICD-10-CM

## 2013-11-01 LAB — HEMOGLOBIN AND HEMATOCRIT, BLOOD
HCT: 28.7 % — ABNORMAL LOW (ref 39.0–52.0)
HCT: 29.8 % — ABNORMAL LOW (ref 39.0–52.0)
HCT: 27.8 % — ABNORMAL LOW (ref 39.0–52.0)
HEMOGLOBIN: 10 g/dL — AB (ref 13.0–17.0)
Hemoglobin: 10.3 g/dL — ABNORMAL LOW (ref 13.0–17.0)
Hemoglobin: 9.8 g/dL — ABNORMAL LOW (ref 13.0–17.0)

## 2013-11-01 LAB — BASIC METABOLIC PANEL
BUN: 22 mg/dL (ref 6–23)
CO2: 19 mEq/L (ref 19–32)
CREATININE: 0.8 mg/dL (ref 0.50–1.35)
Calcium: 8.7 mg/dL (ref 8.4–10.5)
Chloride: 103 mEq/L (ref 96–112)
GFR, EST NON AFRICAN AMERICAN: 80 mL/min — AB (ref >90)
Glucose, Bld: 81 mg/dL (ref 70–99)
Potassium: 4.6 mEq/L (ref 3.7–5.3)
Sodium: 139 mEq/L (ref 137–147)

## 2013-11-01 LAB — GLUCOSE, CAPILLARY
GLUCOSE-CAPILLARY: 116 mg/dL — AB (ref 70–99)
GLUCOSE-CAPILLARY: 85 mg/dL (ref 70–99)
GLUCOSE-CAPILLARY: 166 mg/dL — AB (ref 70–99)
Glucose-Capillary: 68 mg/dL — ABNORMAL LOW (ref 70–99)
Glucose-Capillary: 245 mg/dL — ABNORMAL HIGH (ref 70–99)

## 2013-11-01 MED ORDER — DEXTROSE 50 % IV SOLN
INTRAVENOUS | Status: AC
  Administered 2013-11-01: 25 mL
  Filled 2013-11-01: qty 50

## 2013-11-01 NOTE — Consult Note (Signed)
Referring Provider: Dr. Niel Hummer  Pr inr and [facility] Physician:  [name], MD Primary Gastroenterologist:  Dr. Cristina Gong   Reason for Consultation:  Anemia, heme positivity and in  HPI: [name] is a 80 y.o. male  in spell yesterday with pneumonia, characterized by shortness of breath, cough, and fever. As part of his evaluation in the emergency room, he was noted to have a significant drop in hemoglobin compared to last October, dropping from 13.1 to 9.8 during that period of time. His BUN in the emergency room was 29, as compared to 19 back in October. His stool was Hemoccult positive in the emergency room.  The patient has a past history of a duodenal ulcer associated with aspirin (biopsies were negative for Helicobacter pylori infection), 5 years ago in February of 2010. That was confirmed to be healed in subsequent endoscopy several months later. At this time, the patient is not on  ulcerogenic medications at home, although he is chronically on Xarelto because of atrial fibrillation.  In February 2010, the patient had colonoscopy which showed a small adenomatous polyp but no vascular ectasia, colitis, diverticulosis, or other source of blood loss.  The patient reports a poor appetite over the past several weeks but he is not aware of any significant weight loss, and there are no other upper or lower GI symptoms that he reports, such as dyspepsia, nausea, abdominal pain, constipation, diarrhea, or rectal bleeding.    Past Medical History  Diagnosis Date  . Diabetes mellitus without complication   . Dysrhythmia     atrial fibrilation  . Hypertension   . Shortness of breath   . Peripheral vascular disease   . Claudication of calf muscles 11/05/2012    right calf  . GERD (gastroesophageal reflux disease)   . Arthritis   . TIA (transient ischemic attack) 2010    Past Surgical History  Procedure Laterality Date  . Foot surgery      foot surgery due to broken foot  years ago    Prior to Admission medications   Medication Sig Start Date End Date Taking? Authorizing Provider  amLODipine (NORVASC) 5 MG tablet Take 7.5 mg by mouth daily.   Yes Historical Provider, MD  carvedilol (COREG) 12.5 MG tablet Take 1 tablet (12.5 mg total) by mouth 2 (two) times daily with a meal. 07/09/13  Yes Velvet Bathe, MD  cilostazol (PLETAL) 50 MG tablet Take 50 mg by mouth 2 (two) times daily.   Yes Historical Provider, MD  glipiZIDE (GLUCOTROL XL) 5 MG 24 hr tablet Take 5 mg by mouth daily.   Yes Historical Provider, MD  loratadine (CLARITIN) 10 MG tablet Take 10 mg by mouth daily.   Yes Historical Provider, MD  metFORMIN (GLUCOPHAGE) 1000 MG tablet Take 1 tablet (1,000 mg total) by mouth 2 (two) times daily with a meal. 11/09/12  Yes Laverda Page, MD  Multiple Vitamins-Minerals (PRESERVISION AREDS PO) Take 1 capsule by mouth daily.   Yes Historical Provider, MD  olmesartan (BENICAR) 40 MG tablet Take 60 mg by mouth daily.    Yes Historical Provider, MD  Rivaroxaban (XARELTO) 20 MG TABS tablet Take 1 tablet (20 mg total) by mouth daily with supper. 07/09/13  Yes Velvet Bathe, MD  simvastatin (ZOCOR) 40 MG tablet Take 40 mg by mouth every evening.   Yes Historical Provider, MD  tiotropium (SPIRIVA) 18 MCG inhalation capsule Place 18 mcg into inhaler and inhale daily.   Yes Historical Provider, MD  Current Facility-Administered Medications  Medication Dose Route Frequency Provider Last Rate Last Dose  . 0.9 %  sodium chloride infusion   Intravenous Continuous Kinnie Feil, MD 75 mL/hr at 11/01/13 [phone]    . acetaminophen (TYLENOL) tablet 650 mg  650 mg Oral Q6H PRN Kinnie Feil, MD       Or  . acetaminophen (TYLENOL) suppository 650 mg  650 mg Rectal Q6H PRN Kinnie Feil, MD      . albuterol (PROVENTIL) (2.5 MG/3ML) 0.083% nebulizer solution 2.5 mg  2.5 mg Inhalation Q6H PRN Kinnie Feil, MD      . azithromycin (ZITHROMAX) 500 mg in dextrose 5 % 250 mL  IVPB  500 mg Intravenous Q24H Kinnie Feil, MD      . carvedilol (COREG) tablet 12.5 mg  12.5 mg Oral BID WC Kinnie Feil, MD   12.5 mg at 11/01/13 1025  . cefTRIAXone (ROCEPHIN) 1 g in dextrose 5 % 50 mL IVPB  1 g Intravenous Q24H Patsey [facility]th[facility]      . insulin aspart (novoLOG) injection 0-9 Units  0-9 Units Subcutaneous TID WC Kinnie Feil, MD      . loratadine (CLARITIN) tablet 10 mg  10 mg Oral Daily Kinnie Feil, MD   10 mg at 11/01/13 1025  . ondansetron (ZOFRAN) tablet 4 mg  4 mg Oral Q6H PRN Kinnie Feil, MD       Or  . ondansetron (ZOFRAN) injection 4 mg  4 mg Intravenous Q6H PRN Kinnie Feil, MD      . pantoprazole (PROTONIX) EC tablet 40 mg  40 mg Oral Daily Kinnie Feil, MD   40 mg at 11/01/13 1025  . simvastatin (ZOCOR) tablet 40 mg  40 mg Oral QPM Kinnie Feil, MD   40 mg at 10/31/13 2049  . sodium chloride 0.9 % injection 3 mL  3 mL Intravenous Q12H Kinnie Feil, MD      . tiotropium (SPIRIVA) inhalation capsule 18 mcg  18 mcg Inhalation Daily Kinnie Feil, MD   18 mcg at 11/01/13 4235    Allergies as of 10/31/2013  . (No Known Allergies)    History reviewed. No pertinent family history.  History   Social History  . Marital Status: Married    Spouse Name: N/A    Number of Children: N/A  . Years of Education: N/A   Occupational History  . Not on file.   Social History Main Topics  . Smoking status: Former Smoker    Types: Cigarettes    Quit date: 09/23/1981  . Smokeless tobacco: Never Used  . Alcohol Use: No     Comment: QUITS YEARS AGO  . Drug Use: No  . Sexual Activity: Not on file   Other Topics Concern  . Not on file   Social History Narrative  . No narrative on file    Review of Systems: see history of present illness   Physical Exam: Vital signs in last 24 hours: Temp:  [97.3 F (36.3 C)-98.1 F (36.7 C)] 97.3 F (36.3 C) (02/09 0500) Pulse Rate:  [64-97] 64 (02/09 1025) Resp:  [18-20] 18  (02/09 0500) BP: (105-135)/(51-81) 105/51 mmHg (02/09 1025) SpO2:  [92 %-100 %] 96 % (02/09 0922) Last BM Date: 10/30/13 General:  elderly, thin Caucasian male in no evident distress  Head:  Normocephalic and atraumatic. Eyes:  Sclera clear, no icterus.   Conjunctiva pale. Mouth:   No  ulcerations or lesions.  Oropharynx pink & moist. Neck:   No masses or thyromegaly. Lungs:  Clear throughout to auscultation.   No wheezes, crackles, or rhonchi. No evident respiratory distress. Heart:  Irregular rhythm; no murmurs, clicks, rubs,  or gallops. Abdomen:  Soft, nontender, nontympanitic, and nondistended. No masses, hepatosplenomegaly or ventral hernias noted. No tenderness Rectal:  Heme positive in ER   Msk:   Left arm slightly contractured s/p CVA Extremities:   Without clubbing, cyanosis, or edema. Neurologic:  Alert and coherent;  S/p left body CVA. Skin:  Intact without significant lesions or rashes. Cervical Nodes:  No significant cervical adenopathy. Psych:   Alert and cooperative. Normal mood and affect.  Intake/Output from previous day: 02/08 0701 - 02/09 0700 In: -  Out: 800 [Urine:800] Intake/Output this shift: Total I/O In: -  Out: 300 [Urine:300]  Lab Results:  Recent Labs  10/31/13 1227 10/31/13 1826 11/01/13 0115 11/01/13 0950  WBC 7.1  --   --   --   HGB 9.8* 10.5* 10.0* 10.3*  HCT 29.1* 30.3* 28.7* 29.8*  PLT 266  --   --   --    BMET  Recent Labs  10/31/13 1227 11/01/13 0115  NA 138 139  K 5.4* 4.6  CL 101 103  CO2 23 19  GLUCOSE 94 81  BUN 29* 22  CREATININE 1.01 0.80  CALCIUM 9.2 8.7   LFT  Recent Labs  10/31/13 1227  PROT 7.4  ALBUMIN 3.4*  AST 18  ALT 16  ALKPHOS 102  BILITOT 0.7   PT/INR No results found for this basename: LABPROT, INR,  in the last 72 hours  Studies/Results: Dg Chest 2 View  10/31/2013   CLINICAL DATA:  Hypotension.  Diabetes.  Asthma.  EXAM: CHEST  2 VIEW  COMPARISON:  DG RIBS UNILATERAL W/CHEST*L* dated  10/23/2013  FINDINGS: Lateral view degraded by patient positioning, including the arms overlying anterior chest. Midline trachea. Cardiomegaly with atherosclerosis in the transverse aorta. Small left pleural effusion is increased since the prior. No pneumothorax. A right lower lobe calcified granuloma. Similar right base volume loss. Worsened left base aeration. Underlying hyperinflation.  IMPRESSION: Worsened left base aeration with increased pleural fluid and adjacent airspace disease. The airspace disease is suspicious for infection or aspiration. Consider short term radiographic followup.  Underline hyperinflation.  Cardiomegaly without congestive failure.   Electronically Signed   By: Abigail Miyamoto M.D.   On: 10/31/2013 13:07   Ct Chest Wo Contrast  10/31/2013   CLINICAL DATA:  Recent rib fractures on 10/23/2013. Productive cough. Evaluate for hemothorax. Possible pneumonia. Shortness of Breath. Chills and fever.  EXAM: CT CHEST WITHOUT CONTRAST  TECHNIQUE: Multidetector CT imaging of the chest was performed following the standard protocol without IV contrast.  COMPARISON:  DG CHEST 2 VIEW dated 10/31/2013; DG RIBS UNILATERAL W/CHEST*L* dated 10/23/2013 DG CHEST 2 VIEW dated 10/31/2013; DG RIBS UNILATERAL W/CHEST*L* dated 10/23/2013  FINDINGS: Lungs/Pleura: Presumed secretions in the dependent trachea on image 20 and right mainstem bronchus on image 26.  Moderate centrilobular emphysema.  Mild degradation by motion and patient left arm position. Calcified granuloma in the anterior right lung base. Collapse/consolidative change at the left lung base. No pneumothorax.  Small left-sided pleural effusion. No evidence of loculation. No complexity identified within to suggest hemothorax.  Heart/Mediastinum: Aortic and branch vessel atherosclerosis. Mild cardiomegaly with small pericardial effusion or anterior thickening. Multivessel coronary artery atherosclerosis. Enlargement of the pulmonary outflow tract, 4.0 cm. No  mediastinal or  definite hilar adenopathy, given limitations of unenhanced CT.  Upper Abdomen: Degradation secondary to EKG leads. Renal atrophy. Probable bilateral nephrolithiasis. Advanced aortic and branch vessel atherosclerosis.  Bones/Musculoskeletal: Mild osteopenia. Multiple posterior lateral lower left rib fractures. These are subacute, present on 10/23/2013 plain film. The ninth and tenth fractures are minimally comminuted. Eighth and eleventh fractures are also seen. Remote right posterior tenth rib fracture.  IMPRESSION: 1. Multifactorial degradation. 2. Multiple subacute left-sided rib fractures with adjacent simple appearing small volume pleural fluid. 3. Collapse/consolidative change at the left lower lobe. Favor atelectasis. Early infection or aspiration is difficult to exclude. 4. Pulmonary artery enlargement suggests pulmonary arterial hypertension. 5. Cardiomegaly with coronary artery atherosclerosis. Small volume anterior pericardial fluid or thickening. 6. Centrilobular emphysema.   Electronically Signed   By: Abigail Miyamoto M.D.   On: 10/31/2013 16:57    Impression: 1. Normocytic anemia, 3 g drop in hemoglobin over the past 4 months 2. Heme positive stool, on chronic anticoagulation 3. Remote history of duodenal ulcer, currently not on ulcerogenic medications 4. Past history of diminutive adenomatous polyp 5 years ago  Plan:  1. Endoscopic evaluation tomorrow morning. Petra Kuba, purpose, and risks reviewed, patient agreeable 2. In view of this patient's age and comorbidities, mobility problems, etc., it would be my hope that we do not have to put him through a colonoscopy. If his endoscopy tomorrow is unrevealing for source of anemia and blood loss, I think it would be reasonable to put him back on Xarelto and monitor Hemoccults over time. If his hemoglobin continues to drop, in association with persistent heme positivity, we may have to consider evaluation of the lower GI tract at some  subsequent time.   LOS: 1 day   Juaquina Machnik,Wong V  11/01/2013, 1:11 PM

## 2013-11-01 NOTE — Progress Notes (Signed)
UR Completed Seferina Brokaw Graves-Bigelow, RN,BSN [phone]  

## 2013-11-01 NOTE — Progress Notes (Signed)
[facility] PROGRESS NOTE  [name] FXT:[id number] DOB: 1928-04-08 DOA: 10/31/2013 PCP: [name], MD  Assessment/Plan: 1. CAP; CXR: L sided infiltrate effusion;  -Continue with IV ceftriaxone and Azithromycin day 2.   -Ct Chest : Multiple subacute left-sided rib fractures with adjacent simple appearing small volume pleural fluid. Collapse/consolidative change at the left lower lobe. Favor atelectasis. Early infection or aspiration is difficult to exclude. Negative for hemothorax.   2. Acute anemia likely blood loss  patient denies no obvious GIB but + occult blood.  -h/o GIB (2010) no obvious sources, s/p EGD: ? Healed PUD, colonoscopy hemorrhoids;  -hold xarelto, GI consulted. Will follow GI regarding resuming Xarelto.    3. A fib chronic; HR controlled  -cont BB, hold xarelto with acute blood loss anemia;  4. Rib fracture; left seventh and eighth lateral ribs; pain management. Incentive spirometry.   5. 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 MCI, confusion likely baseline; cont monitoring  7. DM last HA1C-5.6; ISS while inpatient . Hypoglycemia. Amp of D 50.  8. Hyper K mild ?related to ARB; hodl ABR; resolved.  9. HTN cont BB, hold amlodipine, ARB due to borderline BP   10-hypotension; resolved. Hold BP medications. IV fluids.   Code Status: Full Code.  Family Communication: none at bedside.  Disposition Plan: remain inpatient   Consultants:  Dr Cristina Gong  Procedures:  none  Antibiotics:  Ceftriaxone 2-08  Azithro 2-08  HPI/Subjective: Feeling ok. No worsening cough. Denies melena, blood in the stool.   Objective: Filed Vitals:   11/01/13 1025  BP: 105/51  Pulse: 64  Temp:   Resp:     Intake/Output Summary (Last 24 hours) at 11/01/13 1053 Last data filed at 11/01/13 0759  Gross per 24 hour  Intake      0 ml  Output   1100 ml  Net  -1100 ml   There were no vitals filed for this  visit.  Exam:   General:  No distress.   Cardiovascular: S 1, S 2 RRR  Respiratory: CTA  Abdomen:bs present, soft, left side hematoma.   Musculoskeletal: trace edema.   Data Reviewed: Basic Metabolic Panel:  Recent Labs Lab 10/31/13 1227 11/01/13 0115  NA 138 139  K 5.4* 4.6  CL 101 103  CO2 23 19  GLUCOSE 94 81  BUN 29* 22  CREATININE 1.01 0.80  CALCIUM 9.2 8.7   Liver Function Tests:  Recent Labs Lab 10/31/13 1227  AST 18  ALT 16  ALKPHOS 102  BILITOT 0.7  PROT 7.4  ALBUMIN 3.4*   No results found for this basename: LIPASE, AMYLASE,  in the last 168 hours No results found for this basename: AMMONIA,  in the last 168 hours CBC:  Recent Labs Lab 10/31/13 1227 10/31/13 1826 11/01/13 0115 11/01/13 0950  WBC 7.1  --   --   --   HGB 9.8* 10.5* 10.0* 10.3*  HCT 29.1* 30.3* 28.7* 29.8*  MCV 98.3  --   --   --   PLT 266  --   --   --    Cardiac Enzymes: No results found for this basename: CKTOTAL, CKMB, CKMBINDEX, TROPONINI,  in the last 168 hours BNP (last 3 results) No results found for this basename: PROBNP,  in the last 8760 hours CBG:  Recent Labs Lab 10/31/13 2100 11/01/13 0742 11/01/13 0928  GLUCAP 86 68* 116*    Recent Results (from the past 240  hour(s))  CULTURE, BLOOD (ROUTINE X 2)     Status: None   Collection Time    10/31/13  1:44 PM      Result Value Range Status   Specimen Description BLOOD RIGHT ARM   Final   Special Requests BOTTLES DRAWN AEROBIC AND ANAEROBIC 3CC   Final   Culture  Setup Time     Final   Value: 10/31/2013 22:00     Performed at [facility]   Culture     Final   Value:        BLOOD CULTURE RECEIVED NO GROWTH TO DATE CULTURE WILL BE HELD FOR 5 DAYS BEFORE ISSUING A FINAL NEGATIVE REPORT     Performed at [facility]   Report Status PENDING   Incomplete  CULTURE, BLOOD (ROUTINE X 2)     Status: None   Collection Time    10/31/13  1:49 PM      Result Value Range Status   Specimen  Description BLOOD LEFT HAND   Final   Special Requests BOTTLES DRAWN AEROBIC ONLY 8CC   Final   Culture  Setup Time     Final   Value: 10/31/2013 22:00     Performed at [facility]   Culture     Final   Value:        BLOOD CULTURE RECEIVED NO GROWTH TO DATE CULTURE WILL BE HELD FOR 5 DAYS BEFORE ISSUING A FINAL NEGATIVE REPORT     Performed at [facility]   Report Status PENDING   Incomplete     Studies: Dg Chest 2 View  10/31/2013   CLINICAL DATA:  Hypotension.  Diabetes.  Asthma.  EXAM: CHEST  2 VIEW  COMPARISON:  DG RIBS UNILATERAL W/CHEST*L* dated 10/23/2013  FINDINGS: Lateral view degraded by patient positioning, including the arms overlying anterior chest. Midline trachea. Cardiomegaly with atherosclerosis in the transverse aorta. Small left pleural effusion is increased since the prior. No pneumothorax. A right lower lobe calcified granuloma. Similar right base volume loss. Worsened left base aeration. Underlying hyperinflation.  IMPRESSION: Worsened left base aeration with increased pleural fluid and adjacent airspace disease. The airspace disease is suspicious for infection or aspiration. Consider short term radiographic followup.  Underline hyperinflation.  Cardiomegaly without congestive failure.   Electronically Signed   By: Abigail Miyamoto M.D.   On: 10/31/2013 13:07   Ct Chest Wo Contrast  10/31/2013   CLINICAL DATA:  Recent rib fractures on 10/23/2013. Productive cough. Evaluate for hemothorax. Possible pneumonia. Shortness of Breath. Chills and fever.  EXAM: CT CHEST WITHOUT CONTRAST  TECHNIQUE: Multidetector CT imaging of the chest was performed following the standard protocol without IV contrast.  COMPARISON:  DG CHEST 2 VIEW dated 10/31/2013; DG RIBS UNILATERAL W/CHEST*L* dated 10/23/2013 DG CHEST 2 VIEW dated 10/31/2013; DG RIBS UNILATERAL W/CHEST*L* dated 10/23/2013  FINDINGS: Lungs/Pleura: Presumed secretions in the dependent trachea on image 20 and right mainstem bronchus  on image 26.  Moderate centrilobular emphysema.  Mild degradation by motion and patient left arm position. Calcified granuloma in the anterior right lung base. Collapse/consolidative change at the left lung base. No pneumothorax.  Small left-sided pleural effusion. No evidence of loculation. No complexity identified within to suggest hemothorax.  Heart/Mediastinum: Aortic and branch vessel atherosclerosis. Mild cardiomegaly with small pericardial effusion or anterior thickening. Multivessel coronary artery atherosclerosis. Enlargement of the pulmonary outflow tract, 4.0 cm. No mediastinal or definite hilar adenopathy, given limitations of unenhanced CT.  Upper Abdomen:  Degradation secondary to EKG leads. Renal atrophy. Probable bilateral nephrolithiasis. Advanced aortic and branch vessel atherosclerosis.  Bones/Musculoskeletal: Mild osteopenia. Multiple posterior lateral lower left rib fractures. These are subacute, present on 10/23/2013 plain film. The ninth and tenth fractures are minimally comminuted. Eighth and eleventh fractures are also seen. Remote right posterior tenth rib fracture.  IMPRESSION: 1. Multifactorial degradation. 2. Multiple subacute left-sided rib fractures with adjacent simple appearing small volume pleural fluid. 3. Collapse/consolidative change at the left lower lobe. Favor atelectasis. Early infection or aspiration is difficult to exclude. 4. Pulmonary artery enlargement suggests pulmonary arterial hypertension. 5. Cardiomegaly with coronary artery atherosclerosis. Small volume anterior pericardial fluid or thickening. 6. Centrilobular emphysema.   Electronically Signed   By: Abigail Miyamoto M.D.   On: 10/31/2013 16:57    Scheduled Meds: . azithromycin  500 mg Intravenous Q24H  . carvedilol  12.5 mg Oral BID WC  . cefTRIAXone (ROCEPHIN)  IV  1 g Intravenous Q24H  . insulin aspart  0-9 Units Subcutaneous TID WC  . loratadine  10 mg Oral Daily  . pantoprazole  40 mg Oral Daily  .  simvastatin  40 mg Oral QPM  . sodium chloride  3 mL Intravenous Q12H  . tiotropium  18 mcg Inhalation Daily   Continuous Infusions: . sodium chloride 75 mL/hr at 11/01/13 8676    Principal Problem:   Pneumonia Active Problems:   HTN (hypertension)   COPD (chronic obstructive pulmonary disease)   Atrial fibrillation   History of GI bleed   CAP (community acquired pneumonia)    Time spent: 35 minutes.     Xandra Laramee  [facility] Pager [phone]. If 7PM-7AM, please contact night-coverage at [email or web address], password [facility] 11/01/2013, 10:53 AM  LOS: 1 day

## 2013-11-02 ENCOUNTER — Encounter (HOSPITAL_COMMUNITY): Payer: Self-pay | Admitting: *Deleted

## 2013-11-02 ENCOUNTER — Encounter (HOSPITAL_COMMUNITY)
Admission: EM | Disposition: A | Discharge status: Home Health | Payer: Medicare Other | Source: Home / Self Care | Attending: Internal Medicine

## 2013-11-02 HISTORY — PX: ESOPHAGOGASTRODUODENOSCOPY: SHX5428

## 2013-11-02 LAB — GLUCOSE, CAPILLARY
GLUCOSE-CAPILLARY: 115 mg/dL — AB (ref 70–99)
GLUCOSE-CAPILLARY: 142 mg/dL — AB (ref 70–99)
Glucose-Capillary: 114 mg/dL — ABNORMAL HIGH (ref 70–99)
Glucose-Capillary: 131 mg/dL — ABNORMAL HIGH (ref 70–99)
Glucose-Capillary: 179 mg/dL — ABNORMAL HIGH (ref 70–99)

## 2013-11-02 LAB — BASIC METABOLIC PANEL
BUN: 20 mg/dL (ref 6–23)
CALCIUM: 8.4 mg/dL (ref 8.4–10.5)
CHLORIDE: 105 meq/L (ref 96–112)
CO2: 22 meq/L (ref 19–32)
Creatinine, Ser: 0.8 mg/dL (ref 0.50–1.35)
GFR calc Af Amer: >90 mL/min (ref >90)
GFR calc non Af Amer: 80 mL/min — ABNORMAL LOW (ref >90)
Glucose, Bld: 100 mg/dL — ABNORMAL HIGH (ref 70–99)
Potassium: 4.3 mEq/L (ref 3.7–5.3)
Sodium: 139 mEq/L (ref 137–147)

## 2013-11-02 LAB — CBC
HCT: 27.8 % — ABNORMAL LOW (ref 39.0–52.0)
Hemoglobin: 9.7 g/dL — ABNORMAL LOW (ref 13.0–17.0)
MCH: 33.8 pg (ref 26.0–34.0)
MCHC: 34.9 g/dL (ref 30.0–36.0)
MCV: 96.9 fL (ref 78.0–100.0)
PLATELETS: 242 10*3/uL (ref 150–400)
RBC: 2.87 MIL/uL — AB (ref 4.22–5.81)
RDW: 13.5 % (ref 11.5–15.5)
WBC: 6 10*3/uL (ref 4.0–10.5)

## 2013-11-02 LAB — HEMOGLOBIN AND HEMATOCRIT, BLOOD
HCT: 28.9 % — ABNORMAL LOW (ref 39.0–52.0)
Hemoglobin: 10.1 g/dL — ABNORMAL LOW (ref 13.0–17.0)

## 2013-11-02 SURGERY — EGD (ESOPHAGOGASTRODUODENOSCOPY)
Anesthesia: Moderate Sedation

## 2013-11-02 MED ORDER — SACCHAROMYCES BOULARDII 250 MG PO CAPS
250.0000 mg | ORAL_CAPSULE | Freq: Two times a day (BID) | ORAL | Status: DC
  Administered 2013-11-02 – 2013-11-03 (×3): 250 mg via ORAL
  Filled 2013-11-02 (×4): qty 1

## 2013-11-02 MED ORDER — FENTANYL CITRATE 0.05 MG/ML IJ SOLN
INTRAMUSCULAR | Status: DC | PRN
  Administered 2013-11-02: 25 ug via INTRAVENOUS

## 2013-11-02 MED ORDER — FENTANYL CITRATE 0.05 MG/ML IJ SOLN
INTRAMUSCULAR | Status: AC
  Filled 2013-11-02: qty 2

## 2013-11-02 MED ORDER — MIDAZOLAM HCL 5 MG/ML IJ SOLN
INTRAMUSCULAR | Status: AC
  Filled 2013-11-02: qty 2

## 2013-11-02 MED ORDER — MIDAZOLAM HCL 10 MG/2ML IJ SOLN
INTRAMUSCULAR | Status: DC | PRN
  Administered 2013-11-02: 2 mg via INTRAVENOUS

## 2013-11-02 MED ORDER — BUTAMBEN-TETRACAINE-BENZOCAINE 2-2-14 % EX AERO
INHALATION_SPRAY | CUTANEOUS | Status: DC | PRN
  Administered 2013-11-02: 2 via TOPICAL

## 2013-11-02 MED ORDER — RIVAROXABAN 20 MG PO TABS
20.0000 mg | ORAL_TABLET | Freq: Every day | ORAL | Status: DC
  Administered 2013-11-02: 20 mg via ORAL
  Filled 2013-11-02 (×2): qty 1

## 2013-11-02 NOTE — Evaluation (Signed)
Occupational Therapy Evaluation Patient Details Name: [name] MRN: [id number] DOB: [birth year] Today's Date: 11/02/2013 Time: 8546-2703 OT Time Calculation (min): 30 min  OT Assessment / Plan / Recommendation History of present illness [name] is a 80 y.o. male with PMH of HTN, HPL, DM, a fib on xarelto, h/o CVA who had recent fall/rib fracture (1/31) presented today with SOB, productive cough, chills, fever fount to have PNA;  denies chest pain, but still has residual pain related to recent rib fracture; denies nausea, vomiting, or diarrhea; has residual L sided mild hemiplegia due to old CVA   Clinical Impression    Pt presents with below problem list. Feel pt will benefit from acute OT to increase independence prior to d/c. Recommending HHOT upon d/c.     OT Assessment  Patient needs continued OT Services    Follow Up Recommendations  Home health OT;Supervision/Assistance - 24 hour    Barriers to Discharge      Equipment Recommendations  None recommended by OT    Recommendations for Other Services    Frequency  Min 2X/week    Precautions / Restrictions Precautions Precautions: Fall   Pertinent Vitals/Pain Pain in left shoulder-elevated on pillow.    ADL  Grooming: Wash/dry face;Wash/dry hands;Minimal assistance Where Assessed - Grooming: Supported standing Lower Body Dressing: Minimal assistance Where Assessed - Lower Body Dressing: Supported sit to Lobbyist: Minimal Print production planner Method: Sit to Loss adjuster, chartered: Regular height toilet;Grab bars Toileting - Clothing Manipulation and Hygiene: Minimal assistance Where Assessed - Best boy and Hygiene: Sit to stand from 3-in-1 or toilet Tub/Shower Transfer Method: Not assessed Equipment Used: Gait belt;Other (comment) (hemi walker) Transfers/Ambulation Related to ADLs: Min A for ambulation with hemiwalker; ambulated short distance without  walker with +2 Total A/Mod A. Min A/Mod A/Min guard for transfers. ADL Comments: Encouraged pt to be using left hand for activities, but to be careful with shoulder (painful). Spoke with wife and she knows how to assist him with ambulation support LUE. Pt requiring rest break during session.    OT Diagnosis: Hemiplegia non-dominant side;Generalized weakness;Other (comment) (decreased activity tolerance)  OT Problem List: Decreased strength;Decreased activity tolerance;Impaired balance (sitting and/or standing);Decreased knowledge of use of DME or AE;Decreased knowledge of precautions;Impaired UE functional use;Increased edema OT Treatment Interventions: Self-care/ADL training;Therapeutic exercise;DME and/or AE instruction;Therapeutic activities;Patient/family education;Balance training;Neuromuscular education;Energy conservation   OT Goals(Current goals can be found in the care plan section) Acute Rehab OT Goals Patient Stated Goal: not stated OT Goal Formulation: With patient Time For Goal Achievement: 11/09/13 Potential to Achieve Goals: Good ADL Goals Pt Will Perform Grooming: with supervision;standing Pt Will Perform Lower Body Bathing: sit to/from stand;with supervision Pt Will Perform Lower Body Dressing: with supervision;sit to/from stand Pt Will Transfer to Toilet: with min guard assist;ambulating;grab bars (comfort height toilet) Pt Will Perform Toileting - Clothing Manipulation and hygiene: with supervision;sit to/from stand Additional ADL Goal #1: pt will independently use left hand as assist during activities. Additional ADL Goal #2: Pt will independently perform HEP to left hand to help decrease edema.  Visit Information  Last OT Received On: 11/02/13 Assistance Needed: +1 History of Present Illness: [name] is a 80 y.o. male with PMH of HTN, HPL, DM, a fib on xarelto, h/o CVA who had recent fall/rib fracture (1/31) presented today with SOB, productive cough, chills,  fever fount to have PNA;  denies chest pain, but still has residual pain related to recent rib fracture; denies  nausea, vomiting, or diarrhea; has residual L sided mild hemiplegia due to old CVA       Prior [place] expects to be discharged to:: Private residence Living Arrangements: Spouse/significant other Available Help at Discharge: Family Type of Home: Mobile home Home Access: Stairs to enter Entrance Stairs-Number of Steps: 5 Entrance Stairs-Rails: Right;Left Home Layout: One level Home Equipment: Cane - quad;Cane - single point;Bedside commode;Hospital bed;Shower seat;Toilet riser (hemi-walker) Prior Function Level of Independence: Independent with assistive device(s) (but someone follows him at all times per pt.) Comments: daughter and son with each of their respective families are on either side of him. Communication Communication: No difficulties Dominant Hand: Right         Vision/Perception     Cognition  Cognition Arousal/Alertness: Awake/alert Behavior During Therapy: WFL for tasks assessed/performed Overall Cognitive Status: Within Functional Limits for tasks assessed    Extremity/Trunk Assessment Upper Extremity Assessment Upper Extremity Assessment: LUE deficits/detail LUE Deficits / Details: residual weakness from previous CVA; pain in shoulder. Lower Extremity Assessment Lower Extremity Assessment: Defer to PT evaluation LLE Deficits / Details: hemiparetic moves in synergy grossly 3+5 LLE Coordination: decreased fine motor     Mobility  Transfers Overall transfer level: Needs assistance Equipment used: Hemi-walker;1 person hand held assist Transfers: Sit to/from Stand Sit to Stand: Min assist;Min guard General transfer comment: Cues for hand placement and technique.     Exercise Other Exercises Other Exercises: educated on AAROM to left digits as pt had edema in digits.      End of Session OT - End of  Session Equipment Utilized During Treatment: Gait belt;Other (comment) (hemi-walker) Activity Tolerance: Patient tolerated treatment well;Patient limited by fatigue Patient left: in chair;with call bell/phone within reach;with family/visitor present  GO     Benito Mccreedy OTR/L [phone] 11/02/2013, 5:27 PM

## 2013-11-02 NOTE — Progress Notes (Signed)
Upper endoscopy was unrevealing for a source of heme positivity or recent decline in hemoglobin.  Please see dictated report for recommendations.  I will sign off at this time, but do not hesitate to call me if I can be of further assistance in this patient's care, or if you have further questions about his case.  Cleotis Nipper, M.D. [phone]

## 2013-11-02 NOTE — Progress Notes (Signed)
Addendum to previous note:   I have ordered a probiotic to help decrease risk of contracting Clostridium difficile infection while in the hospital on antibiotics.  I have ordered a heart healthy diet.  Although I do not think he needs to remain in the hospital from the GI tract standpoint, of course he has other medical issues, including pulmonary status, that may need further treatment.  I don't think the patient will need ongoing PPI therapy as an outpatient, despite his past history of ulcer disease (which was, I believe, associated with aspirin use at that time) in view of his negative endoscopic findings and since he was apparently not on a PPI prior to admission and is not currently on ulcerogenic medications.  Cleotis Nipper, M.D. [phone]

## 2013-11-02 NOTE — Evaluation (Signed)
Physical Therapy Evaluation Patient Details Name: [name] MRN: [id number] DOB: 10-05-1927 Today's Date: 11/02/2013 Time: 4098-1191 PT Time Calculation (min): 32 min  PT Assessment / Plan / Recommendation History of Present Illness  [name] is a 80 y.o. male with PMH of HTN, HPL, DM, a fib on xarelto, h/o CVA who had recent fall/rib fracture (1/31) presented today with SOB, productive cough, chills, fever fount to have PNA;  denies chest pain, but still has residual pain related to recent rib fracture; denies nausea, vomiting, or diarrhea; has residual L sided mild hemiplegia due to old CVA  Clinical Impression   Pt admitted with/for PNA after recently falling and fx'ing ribs.  Pt currently limited functionally due to the problems listed below.  (see problems list.)  Pt will benefit from PT to maximize function and safety to be able to get home safely with available assist from family.      PT Assessment  Patient needs continued PT services    Follow Up Recommendations  Home health PT    Does the patient have the potential to tolerate intense rehabilitation      Barriers to Discharge        Equipment Recommendations  None recommended by PT    Recommendations for Other Services     Frequency Min 3X/week    Precautions / Restrictions Precautions Precautions: Fall   Pertinent Vitals/Pain During walk to the bathroom, sats maintained in mid 90's, HR  Also in the 90's bpm      Mobility  Bed Mobility Overal bed mobility: Needs Assistance Bed Mobility: Supine to Sit Supine to sit: Min assist (with rail) General bed mobility comments: says he has an exact set up with his hospital bed and used R rail extensively Transfers Overall transfer level: Needs assistance Transfers: Sit to/from Stand Sit to Stand: Min assist General transfer comment: steady assist only Ambulation/Gait Ambulation/Gait assistance: Min assist Ambulation Distance (Feet): 20 Feet (times  2) Assistive device: 1 person hand held assist Gait Pattern/deviations: Step-to pattern (mildly hemiparetic gait.) Gait velocity: slow Gait velocity interpretation: <1.8 ft/sec, indicative of risk for recurrent falls Modified Rankin (Stroke Patients Only) Pre-Morbid Rankin Score: No symptoms Modified Rankin: Moderately severe disability (approaching 3)    Exercises     PT Diagnosis: Difficulty walking;Generalized weakness;Other (comment) (decr activity tolerance)  PT Problem List: Decreased strength;Decreased activity tolerance;Decreased balance;Decreased mobility;Cardiopulmonary status limiting activity PT Treatment Interventions: Gait training;Functional mobility training;Therapeutic activities;Balance training;Patient/family education     PT Goals(Current goals can be found in the care plan section) Acute Rehab PT Goals Patient Stated Goal: got to be able to get my breath better. PT Goal Formulation: With patient Time For Goal Achievement: 11/09/13 Potential to Achieve Goals: Good  Visit Information  Last PT Received On: 11/02/13 Assistance Needed: +1 History of Present Illness: [name] is a 80 y.o. male with PMH of HTN, HPL, DM, a fib on xarelto, h/o CVA who had recent fall/rib fracture (1/31) presented today with SOB, productive cough, chills, fever fount to have PNA;  denies chest pain, but still has residual pain related to recent rib fracture; denies nausea, vomiting, or diarrhea; has residual L sided mild hemiplegia due to old CVA       Prior [place] expects to be discharged to:: Private residence Living Arrangements: Spouse/significant other Available Help at Discharge: Family Type of Home: Mobile home Home Access: Stairs to enter Entrance Stairs-Number of Steps: 5 Entrance Stairs-Rails: Right;Left Home Layout:  One level Home Equipment: Cane - quad;Cane - single point;Tub bench;Bedside commode;Hospital bed Prior  Function Level of Independence: Independent with assistive device(s) (but someone follows him at all times per pt.) Comments: daughter and son with each of their respective families are on either side of him. Communication Communication: No difficulties Dominant Hand: Right    Cognition  Cognition Arousal/Alertness: Awake/alert Behavior During Therapy: WFL for tasks assessed/performed Overall Cognitive Status: No family/caregiver present to determine baseline cognitive functioning (mildly confused, slow historian)    Extremity/Trunk Assessment Upper Extremity Assessment Upper Extremity Assessment: LUE deficits/detail LUE Deficits / Details: grip 1/5 incl release. bic/tris in synergy 2+5 Lower Extremity Assessment Lower Extremity Assessment: LLE deficits/detail LLE Deficits / Details: hemiparetic moves in synergy grossly 3+5 LLE Coordination: decreased fine motor   Balance Balance Overall balance assessment: Needs assistance Sitting-balance support: Feet supported;Single extremity supported Sitting balance-Leahy Scale: Fair Standing balance support: Single extremity supported Standing balance-Leahy Scale: Fair  End of Session PT - End of Session Activity Tolerance: Patient tolerated treatment well Patient left: in chair;with call bell/phone within reach Nurse Communication: Mobility status  GP     Shandreka Dante, Tessie Fass 11/02/2013, 1:52 PM 11/02/2013  Donnella Sham, [place] [phone]  (pager)

## 2013-11-02 NOTE — Progress Notes (Addendum)
[facility] PROGRESS NOTE  [name] SWN:[id number] DOB: [birth year] DOA: 10/31/2013 PCP: [name], MD  Assessment/Plan: 80 year old with PMH significant for HTN, HPL, DM, a fib on xarelto, h/o CVA who had recent fall/rib fracture (1/31) presented today with SOB, productive cough, chills, fever fount to have PNA; initially hypotensive. Hb lower than baseline. Guaiac positive on admission. Initially Xarelto was on hold. He underwent endoscopy which was negative for source of bleeding. He is receiving treatment for PNA.   1. CAP; CXR: L sided infiltrate effusion;  -Continue with IV ceftriaxone and Azithromycin day 3.   -Ct Chest : Multiple subacute left-sided rib fractures with adjacent simple appearing small volume pleural fluid. Collapse/consolidative change at the left lower lobe. Favor atelectasis. Early infection or aspiration is difficult to exclude. Negative for hemothorax.  -WBC normal.  -Could transition to Levaquin 2-11.   2. Acute anemia likely blood loss  patient denies no obvious GIB but + occult blood.  -h/o GIB (2010) no obvious sources, s/p EGD: ? Healed PUD, colonoscopy hemorrhoids;  -Hb has remain stable at 10.  -He underwent endoscopy 2-09 with no source of bleeding.  -Ok to resume xarelto.  -Need follow up with PCP for repeat Hb and occult blood. If he develop significant decrease in Hb might need colonoscopy. Please see Dr Cristina Gong endoscopy report.  -No need for PPI.   3. A fib chronic; HR controlled  -cont BB,. -Resume Xarelto. Monitor Hb.  4. Rib fracture; left seventh and eighth lateral ribs; pain management. Incentive spirometry.  5. H/o CVA with L sided mild residual hemiplegia; resume xarelto.  6. MCI, confusion likely baseline; cont monitoring  7. DM last HA1C-5.6; ISS while inpatient . 8. Hyper K mild ?related to ARB; hodl ABR; resolved.  9. HTN cont BB, hold amlodipine, ARB due to borderline BP. Continue with Coreg.  10-hypotension; resolved.  Hold BP medications. NSL.   Code Status: Full Code.  Family Communication: none at bedside.  Disposition Plan: remain inpatient. PT , OT evaluation.    Consultants:  Dr Cristina Gong  Procedures: Endoscopy;  1. Moderate bile reflux  2. Otherwise normal upper endoscopy. No source of patient's heme  positivity or recent drop in hemoglobin endoscopically evident    Antibiotics:  Ceftriaxone 2-08  Azithro 2-08  HPI/Subjective: Feeling ok. No worsening cough.   Objective: Filed Vitals:   11/02/13 0850  BP:   Pulse: 83  Temp:   Resp: 19    Intake/Output Summary (Last 24 hours) at 11/02/13 1309 Last data filed at 11/02/13 0500  Gross per 24 hour  Intake    240 ml  Output    675 ml  Net   -435 ml   There were no vitals filed for this visit.  Exam:   General:  No distress.   Cardiovascular: S 1, S 2 RRR  Respiratory: CTA  Abdomen:bs present, soft, left side hematoma.   Musculoskeletal: trace edema.   Data Reviewed: Basic Metabolic Panel:  Recent Labs Lab 10/31/13 1227 11/01/13 0115 11/02/13 0135  NA 138 139 139  K 5.4* 4.6 4.3  CL 101 103 105  CO2 23 19 22   GLUCOSE 94 81 100*  BUN 29* 22 20  CREATININE 1.01 0.80 0.80  CALCIUM 9.2 8.7 8.4   Liver Function Tests:  Recent Labs Lab 10/31/13 1227  AST 18  ALT 16  ALKPHOS 102  BILITOT 0.7  PROT 7.4  ALBUMIN 3.4*   No results found for this basename: LIPASE, AMYLASE,  in the last 168 hours No results found for this basename: AMMONIA,  in the last 168 hours CBC:  Recent Labs Lab 10/31/13 1227  11/01/13 0115 11/01/13 0950 11/01/13 1704 11/02/13 0135 11/02/13 0950  WBC 7.1  --   --   --   --  6.0  --   HGB 9.8*  < > 10.0* 10.3* 9.8* 9.7* 10.1*  HCT 29.1*  < > 28.7* 29.8* 27.8* 27.8* 28.9*  MCV 98.3  --   --   --   --  96.9  --   PLT 266  --   --   --   --  242  --   < > = values in this interval not displayed. Cardiac Enzymes: No results found for this basename: CKTOTAL, CKMB,  CKMBINDEX, TROPONINI,  in the last 168 hours BNP (last 3 results) No results found for this basename: PROBNP,  in the last 8760 hours CBG:  Recent Labs Lab 11/01/13 1650 11/01/13 2035 11/02/13 0758 11/02/13 0917 11/02/13 1107  GLUCAP 245* 166* 114* 115* 142*    Recent Results (from the past 240 hour(s))  CULTURE, BLOOD (ROUTINE X 2)     Status: None   Collection Time    10/31/13  1:44 PM      Result Value Range Status   Specimen Description BLOOD RIGHT ARM   Final   Special Requests BOTTLES DRAWN AEROBIC AND ANAEROBIC 3CC   Final   Culture  Setup Time     Final   Value: 10/31/2013 22:00     Performed at [facility]   Culture     Final   Value:        BLOOD CULTURE RECEIVED NO GROWTH TO DATE CULTURE WILL BE HELD FOR 5 DAYS BEFORE ISSUING A FINAL NEGATIVE REPORT     Performed at [facility]   Report Status PENDING   Incomplete  CULTURE, BLOOD (ROUTINE X 2)     Status: None   Collection Time    10/31/13  1:49 PM      Result Value Range Status   Specimen Description BLOOD LEFT HAND   Final   Special Requests BOTTLES DRAWN AEROBIC ONLY 8CC   Final   Culture  Setup Time     Final   Value: 10/31/2013 22:00     Performed at [facility]   Culture     Final   Value:        BLOOD CULTURE RECEIVED NO GROWTH TO DATE CULTURE WILL BE HELD FOR 5 DAYS BEFORE ISSUING A FINAL NEGATIVE REPORT     Performed at [facility]   Report Status PENDING   Incomplete     Studies: Ct Chest Wo Contrast  10/31/2013   CLINICAL DATA:  Recent rib fractures on 10/23/2013. Productive cough. Evaluate for hemothorax. Possible pneumonia. Shortness of Breath. Chills and fever.  EXAM: CT CHEST WITHOUT CONTRAST  TECHNIQUE: Multidetector CT imaging of the chest was performed following the standard protocol without IV contrast.  COMPARISON:  DG CHEST 2 VIEW dated 10/31/2013; DG RIBS UNILATERAL W/CHEST*L* dated 10/23/2013 DG CHEST 2 VIEW dated 10/31/2013; DG RIBS UNILATERAL W/CHEST*L*  dated 10/23/2013  FINDINGS: Lungs/Pleura: Presumed secretions in the dependent trachea on image 20 and right mainstem bronchus on image 26.  Moderate centrilobular emphysema.  Mild degradation by motion and patient left arm position. Calcified granuloma in the anterior right lung base. Collapse/consolidative change at the left lung base. No pneumothorax.  Small left-sided  pleural effusion. No evidence of loculation. No complexity identified within to suggest hemothorax.  Heart/Mediastinum: Aortic and branch vessel atherosclerosis. Mild cardiomegaly with small pericardial effusion or anterior thickening. Multivessel coronary artery atherosclerosis. Enlargement of the pulmonary outflow tract, 4.0 cm. No mediastinal or definite hilar adenopathy, given limitations of unenhanced CT.  Upper Abdomen: Degradation secondary to EKG leads. Renal atrophy. Probable bilateral nephrolithiasis. Advanced aortic and branch vessel atherosclerosis.  Bones/Musculoskeletal: Mild osteopenia. Multiple posterior lateral lower left rib fractures. These are subacute, present on 10/23/2013 plain film. The ninth and tenth fractures are minimally comminuted. Eighth and eleventh fractures are also seen. Remote right posterior tenth rib fracture.  IMPRESSION: 1. Multifactorial degradation. 2. Multiple subacute left-sided rib fractures with adjacent simple appearing small volume pleural fluid. 3. Collapse/consolidative change at the left lower lobe. Favor atelectasis. Early infection or aspiration is difficult to exclude. 4. Pulmonary artery enlargement suggests pulmonary arterial hypertension. 5. Cardiomegaly with coronary artery atherosclerosis. Small volume anterior pericardial fluid or thickening. 6. Centrilobular emphysema.   Electronically Signed   By: Abigail Miyamoto M.D.   On: 10/31/2013 16:57    Scheduled Meds: . azithromycin  500 mg Intravenous Q24H  . carvedilol  12.5 mg Oral BID WC  . cefTRIAXone (ROCEPHIN)  IV  1 g Intravenous Q24H   . insulin aspart  0-9 Units Subcutaneous TID WC  . loratadine  10 mg Oral Daily  . pantoprazole  40 mg Oral Daily  . saccharomyces boulardii  250 mg Oral BID  . simvastatin  40 mg Oral QPM  . sodium chloride  3 mL Intravenous Q12H  . tiotropium  18 mcg Inhalation Daily   Continuous Infusions:    Principal Problem:   Pneumonia Active Problems:   HTN (hypertension)   COPD (chronic obstructive pulmonary disease)   Atrial fibrillation   History of GI bleed   CAP (community acquired pneumonia)    Time spent: 35 minutes.     Roxanna Mcever  [facility] Pager [phone]. If 7PM-7AM, please contact night-coverage at [email or web address], password [facility] 11/02/2013, 1:09 PM  LOS: 2 days

## 2013-11-02 NOTE — Op Note (Signed)
[facility] [place], [address]   ENDOSCOPY PROCEDURE REPORT  PATIENT: Curtis, [name]  MR#: [id number] BIRTHDATE: 07-02-1928 , 78  yrs. old GENDER: Male ENDOSCOPIST:Quame Adya Wirz, MD REFERRED BY:  Dr. [name] PROCEDURE DATE:  11/02/2013 PROCEDURE:   upper endoscopy ASA CLASS: INDICATIONS:   heme positive stool with drop in hemoglobin while on Xarelto. History of duodenal ulcer 5 years ago MEDICATION:    fentanyl 25 mcg, Versed 2 mg IV TOPICAL ANESTHETIC:    Cetacaine spray  DESCRIPTION OF PROCEDURE:   the patient was brought as an inpatient from his hospital room to the [facility] cone endoscopy unit, having provided written consent. Time out was performed. He received the above sedation, with mild the saturation down to the high 80s which responded to turning up his oxygen and stimulating the patient slightly. He was never unresponsive.  The Pentax adult video endoscope was passed under direct vision. The vocal cords were not seen.  The esophagus was entered without significant difficulty and was normal in its entirety. A minimal tongue of Barrett's mucosa may have been present at the GE junction but there was no reflux esophagitis, Barrett's esophagus, varices, infection, or neoplasia in the esophagus, and no hiatal hernia, ring, or stricture was noted.  The stomach was entered. It contained a moderate bili is residual which was suctioned out.  There was no blood or coffee-ground material in the stomach.  There was a solitary mucosal hemorrhage in the antral region of the stomach, not felt to be clinically significant.  No gastritis, erosions, polyps, masses, or ulcers were seen in the stomach and a retroflex view the cardia was normal.  The pylorus, duodenal bulb, and second duodenum looked normal. Careful inspection showed no evidence of a recurrent duodenal ulcer.  The scope was removed from the patient. No biopsies  were obtained. Overall, he tolerated the procedure quite well.     COMPLICATIONS: None  ENDOSCOPIC IMPRESSION:  1. Moderate bile reflux  2. Otherwise normal upper endoscopy. No source of patient's heme positivity or recent drop in hemoglobin endoscopically evident  RECOMMENDATIONS:  1. Okay to resume Xarelto  2. Okay for discharge from the GI tract standpoint  3. The cause of the patient's drop in hemoglobin and his heme positive stool is not clear, but I feel that that could be pursued as an outpatient. I would favor followup with his primary physician, keeping check on Hemoccult status and hemoglobin levels. We know that the patient had a negative colonoscopy, other than a small adenomatous polyp, 5 years ago. He did not have vascular ectasia or diverticular disease at that time. Since his current anemia is not microcytic, I think it is unlikely he is harboring colorectal neoplasia or any other treatable, significant condition and lower GI tract and given his advanced age and somewhat tenuous medical status overall (status post CVA, pulmonary issues), I would be hesitant to put him through colonoscopy unless he has problems with progressive anemia or persistent heme positivity as an outpatient.    _______________________________ Lorrin MaisRonald Lobo, MD 11/02/2013 8:45 AM    PATIENT NAME:  [name], [name] MR#: [id number]

## 2013-11-03 DIAGNOSIS — J449 Chronic obstructive pulmonary disease, unspecified: Secondary | ICD-10-CM

## 2013-11-03 LAB — BASIC METABOLIC PANEL
BUN: 16 mg/dL (ref 6–23)
CHLORIDE: 107 meq/L (ref 96–112)
CO2: 22 mEq/L (ref 19–32)
CREATININE: 0.82 mg/dL (ref 0.50–1.35)
Calcium: 8.5 mg/dL (ref 8.4–10.5)
GFR, EST NON AFRICAN AMERICAN: 80 mL/min — AB (ref >90)
Glucose, Bld: 125 mg/dL — ABNORMAL HIGH (ref 70–99)
Potassium: 4.1 mEq/L (ref 3.7–5.3)
Sodium: 141 mEq/L (ref 137–147)

## 2013-11-03 LAB — GLUCOSE, CAPILLARY
GLUCOSE-CAPILLARY: 124 mg/dL — AB (ref 70–99)
Glucose-Capillary: 123 mg/dL — ABNORMAL HIGH (ref 70–99)

## 2013-11-03 LAB — CBC
HEMATOCRIT: 27.7 % — AB (ref 39.0–52.0)
Hemoglobin: 9.6 g/dL — ABNORMAL LOW (ref 13.0–17.0)
MCH: 33.7 pg (ref 26.0–34.0)
MCHC: 34.7 g/dL (ref 30.0–36.0)
MCV: 97.2 fL (ref 78.0–100.0)
Platelets: 246 10*3/uL (ref 150–400)
RBC: 2.85 MIL/uL — ABNORMAL LOW (ref 4.22–5.81)
RDW: 13.6 % (ref 11.5–15.5)
WBC: 6.9 10*3/uL (ref 4.0–10.5)

## 2013-11-03 MED ORDER — LEVOFLOXACIN 750 MG PO TABS: 750.0000 mg | ORAL_TABLET | Freq: Every day | ORAL | Status: DC

## 2013-11-03 MED ORDER — SACCHAROMYCES BOULARDII 250 MG PO CAPS: 250.0000 mg | ORAL_CAPSULE | Freq: Two times a day (BID) | ORAL | Status: DC

## 2013-11-03 MED ORDER — OLMESARTAN MEDOXOMIL 40 MG PO TABS: 40.0000 mg | ORAL_TABLET | Freq: Every day | ORAL | Status: DC

## 2013-11-03 MED ORDER — ALBUTEROL SULFATE HFA 108 (90 BASE) MCG/ACT IN AERS: 2.0000 | INHALATION_SPRAY | Freq: Four times a day (QID) | RESPIRATORY_TRACT | Status: DC | PRN

## 2013-11-03 NOTE — Discharge Summary (Signed)
Physician Discharge Summary  Patient ID: [name] MRN: [id number] DOB/AGE: 01-15-1928 80 y.o.  Admit date: 10/31/2013 Discharge date: 11/03/2013  Primary Care Physician:  [name], MD  Discharge Diagnoses:    . CAP (community acquired pneumonia) . COPD (chronic obstructive pulmonary disease) . Atrial fibrillation . HTN (hypertension) . recent fall with rib fracture . residual L sided mild hemiplegia due to old CVA  Acute blood loss anemia with Hemoccult positive  Consults: Gastroenterology, Dr. Harlene Ramus   Recommendations for Outpatient Follow-up:  Please follow CBC, BMET   Allergies:  No Known Allergies   Discharge Medications:   Medication List    STOP taking these medications       amLODipine 5 MG tablet  Commonly known as:  NORVASC      TAKE these medications       albuterol 108 (90 BASE) MCG/ACT inhaler  Commonly known as:  PROVENTIL HFA;VENTOLIN HFA  Inhale 2 puffs into the lungs every 6 (six) hours as needed for wheezing or shortness of breath.     carvedilol 12.5 MG tablet  Commonly known as:  COREG  Take 1 tablet (12.5 mg total) by mouth 2 (two) times daily with a meal.     cilostazol 50 MG tablet  Commonly known as:  PLETAL  Take 50 mg by mouth 2 (two) times daily.     glipiZIDE 5 MG 24 hr tablet  Commonly known as:  GLUCOTROL XL  Take 5 mg by mouth daily.     levofloxacin 750 MG tablet  Commonly known as:  LEVAQUIN  Take 1 tablet (750 mg total) by mouth daily. X 1 week     loratadine 10 MG tablet  Commonly known as:  CLARITIN  Take 10 mg by mouth daily.     metFORMIN 1000 MG tablet  Commonly known as:  GLUCOPHAGE  Take 1 tablet (1,000 mg total) by mouth 2 (two) times daily with a meal.     olmesartan 40 MG tablet  Commonly known as:  BENICAR  Take 1 tablet (40 mg total) by mouth daily.     PRESERVISION AREDS PO  Take 1 capsule by mouth daily.     Rivaroxaban 20 MG Tabs tablet  Commonly known as:  XARELTO  Take 1 tablet (20  mg total) by mouth daily with supper.     saccharomyces boulardii 250 MG capsule  Commonly known as:  FLORASTOR  Take 1 capsule (250 mg total) by mouth 2 (two) times daily.     simvastatin 40 MG tablet  Commonly known as:  ZOCOR  Take 40 mg by mouth every evening.     tiotropium 18 MCG inhalation capsule  Commonly known as:  SPIRIVA  Place 18 mcg into inhaler and inhale daily.         Brief H and P: For complete details please refer to admission H and P, but in brief [name] is a 80 y.o. male with PMH of HTN, HPL, DM, a fib on xarelto, h/o CVA who had recent fall/rib fracture (1/31) presented with SOB, productive cough, chills, fever fount to have PNA; denied chest pain, but still has residual pain related to recent rib fracture; denies nausea, vomiting, or diarrhea; has residual L sided mild hemiplegia due to old CVA   Hospital Course:   80 year old with PMH significant for HTN, HPL, DM, a fib on xarelto, h/o CVA who had recent fall/rib fracture (1/31) presented today with SOB, productive cough, chills, fever  fount to have PNA; initially hypotensive. Hb lower than baseline. Guaiac positive on admission. Initially Xarelto was on hold. He underwent endoscopy which was negative for source of bleeding.    1. CAP; CXR: Chest x-ray showed left-sided infiltrate. Patient was placed on IV Zithromax and ceftriaxone. CT chest was obtained which showed Multiple subacute left-sided rib fractures with adjacent simple appearing small volume pleural fluid. Collapse/consolidative change at the left lower lobe. Favor atelectasis. Early infection or aspiration is difficult to exclude. Negative for hemothorax. Likely due to recent fall. WBC normal. Patient was transitioned to oral antibiotics upon DC.   2. Acute anemia likely blood loss patient denied no obvious GIB but + occult blood. He had history of GIB (2010) no obvious sources, s/p EGD: ? Healed PUD, colonoscopy hemorrhoids. GI was  consulted and patient underwent endoscopy on 11/01/2013 which showed no source of bleeding. Per gastroenterology, okay to resume xarelto and he needs to followup with his primary care physician for repeat hemoglobin and occult blood. If he develop significant decrease in Hb might need colonoscopy.   3. A fib chronic; HR controlled  -cont BB,.  -Resume Xarelto. Monitor Hb.   4. Rib fracture; left seventh and eighth lateral ribs; pain management. Incentive spirometry.   5. H/o CVA with L sided mild residual hemiplegia; resume xarelto.   6. MCI, confusion likely baseline; cont monitoring   7. DM last HA1C-5.6; ISS while inpatient .   9. HTN cont BB, hold amlodipine, ARB due to borderline BP. Continue with Coreg.   10-hypotension; resolved. Initially BP meds were held.  Procedures:  Endoscopy;  1. Moderate bile reflux  2. Otherwise normal upper endoscopy. No source of patient's heme  positivity or recent drop in hemoglobin endoscopically evident    Day of Discharge BP 137/77  Pulse 78  Temp(Src) 98.5 F (36.9 C) (Oral)  Resp 18  Ht 5\' 10"  (1.778 m)  SpO2 97%  Physical Exam: General: Alert and awake oriented x3 not in any acute distress. HEENT: anicteric sclera, pupils reactive to light and accommodation CVS: S1-S2 clear no murmur rubs or gallops Chest: clear to auscultation bilaterally, no wheezing rales or rhonchi Abdomen: soft nontender, nondistended, normal bowel sounds Extremities: no cyanosis, clubbing or edema noted bilaterally    The results of significant diagnostics from this hospitalization (including imaging, microbiology, ancillary and laboratory) are listed below for reference.    LAB RESULTS: Basic Metabolic Panel:  Recent Labs Lab 11/02/13 0135 11/03/13 0349  NA 139 141  K 4.3 4.1  CL 105 107  CO2 22 22  GLUCOSE 100* 125*  BUN 20 16  CREATININE 0.80 0.82  CALCIUM 8.4 8.5   Liver Function Tests:  Recent Labs Lab 10/31/13 1227  AST 18  ALT  16  ALKPHOS 102  BILITOT 0.7  PROT 7.4  ALBUMIN 3.4*   No results found for this basename: LIPASE, AMYLASE,  in the last 168 hours No results found for this basename: AMMONIA,  in the last 168 hours CBC:  Recent Labs Lab 11/02/13 0135 11/02/13 0950 11/03/13 0349  WBC 6.0  --  6.9  HGB 9.7* 10.1* 9.6*  HCT 27.8* 28.9* 27.7*  MCV 96.9  --  97.2  PLT 242  --  246   Cardiac Enzymes: No results found for this basename: CKTOTAL, CKMB, CKMBINDEX, TROPONINI,  in the last 168 hours BNP: No components found with this basename: POCBNP,  CBG:  Recent Labs Lab 11/02/13 2043 11/03/13 0755  GLUCAP 179* 123*  Significant Diagnostic Studies:  Dg Chest 2 View  10/31/2013   CLINICAL DATA:  Hypotension.  Diabetes.  Asthma.  EXAM: CHEST  2 VIEW  COMPARISON:  DG RIBS UNILATERAL W/CHEST*L* dated 10/23/2013  FINDINGS: Lateral view degraded by patient positioning, including the arms overlying anterior chest. Midline trachea. Cardiomegaly with atherosclerosis in the transverse aorta. Small left pleural effusion is increased since the prior. No pneumothorax. A right lower lobe calcified granuloma. Similar right base volume loss. Worsened left base aeration. Underlying hyperinflation.  IMPRESSION: Worsened left base aeration with increased pleural fluid and adjacent airspace disease. The airspace disease is suspicious for infection or aspiration. Consider short term radiographic followup.  Underline hyperinflation.  Cardiomegaly without congestive failure.   Electronically Signed   By: Abigail Miyamoto M.D.   On: 10/31/2013 13:07   Ct Chest Wo Contrast  10/31/2013   CLINICAL DATA:  Recent rib fractures on 10/23/2013. Productive cough. Evaluate for hemothorax. Possible pneumonia. Shortness of Breath. Chills and fever.  EXAM: CT CHEST WITHOUT CONTRAST  TECHNIQUE: Multidetector CT imaging of the chest was performed following the standard protocol without IV contrast.  COMPARISON:  DG CHEST 2 VIEW dated 10/31/2013;  DG RIBS UNILATERAL W/CHEST*L* dated 10/23/2013 DG CHEST 2 VIEW dated 10/31/2013; DG RIBS UNILATERAL W/CHEST*L* dated 10/23/2013  FINDINGS: Lungs/Pleura: Presumed secretions in the dependent trachea on image 20 and right mainstem bronchus on image 26.  Moderate centrilobular emphysema.  Mild degradation by motion and patient left arm position. Calcified granuloma in the anterior right lung base. Collapse/consolidative change at the left lung base. No pneumothorax.  Small left-sided pleural effusion. No evidence of loculation. No complexity identified within to suggest hemothorax.  Heart/Mediastinum: Aortic and branch vessel atherosclerosis. Mild cardiomegaly with small pericardial effusion or anterior thickening. Multivessel coronary artery atherosclerosis. Enlargement of the pulmonary outflow tract, 4.0 cm. No mediastinal or definite hilar adenopathy, given limitations of unenhanced CT.  Upper Abdomen: Degradation secondary to EKG leads. Renal atrophy. Probable bilateral nephrolithiasis. Advanced aortic and branch vessel atherosclerosis.  Bones/Musculoskeletal: Mild osteopenia. Multiple posterior lateral lower left rib fractures. These are subacute, present on 10/23/2013 plain film. The ninth and tenth fractures are minimally comminuted. Eighth and eleventh fractures are also seen. Remote right posterior tenth rib fracture.  IMPRESSION: 1. Multifactorial degradation. 2. Multiple subacute left-sided rib fractures with adjacent simple appearing small volume pleural fluid. 3. Collapse/consolidative change at the left lower lobe. Favor atelectasis. Early infection or aspiration is difficult to exclude. 4. Pulmonary artery enlargement suggests pulmonary arterial hypertension. 5. Cardiomegaly with coronary artery atherosclerosis. Small volume anterior pericardial fluid or thickening. 6. Centrilobular emphysema.   Electronically Signed   By: Abigail Miyamoto M.D.   On: 10/31/2013 16:57       Disposition and  Follow-up: Discharge Orders   Future Orders Complete By Expires   Diet Carb Modified  As directed    Increase activity slowly  As directed        DISPOSITION: Home with home health PT OT   DIET: Carb modified  TESTS THAT NEED FOLLOW-UP Hemoglobin hematocrit, Hemoccult  DISCHARGE FOLLOW-UP Follow-up Information   Follow up with [name], MD. Schedule an appointment as soon as possible for a visit in 10 days. (for hospital follow-up)    Specialty:  Internal Medicine   Contact information:   [address] [place] [address] [phone]       Time spent on Discharge: 40 minutes  Signed:   Aven Christen M.D. [facility] 11/03/2013, 11:17 AM Pager: [phone]

## 2013-11-03 NOTE — Care Management Note (Signed)
    Page 1 of 1   11/03/2013     11:48:40 AM   CARE MANAGEMENT NOTE 11/03/2013  Patient:  [name], [name]   Account Number:  [id number]  Date Initiated:  11/03/2013  Documentation initiated by:  GRAVES-BIGELOW,Sofiya Ezelle  Subjective/Objective Assessment:   Pt admitted for PNA and anemia. Pt is from home with wife and plans to be d/c today with [facility] services. Pt and wife agreeable to [facility] services with [facility].     Action/Plan:   CM did make referral for services and SOC to begin within 24-48 hrs post d/c.   Anticipated DC Date:  11/03/2013   Anticipated DC Plan:  [place]  CM consult      [facility] Choice  HOME HEALTH   Choice offered to / List presented to:  C-3 Spouse        HH arranged  HH-1 RN  HH-10 DISEASE MANAGEMENT  HH-2 PT  HH-3 OT      [place].   Status of service:  Completed, signed off Medicare Important Message given?   (If response is "NO", the following Medicare IM given date fields will be blank) Date Medicare IM given:   Date Additional Medicare IM given:    Discharge Disposition:  [place]  Per UR Regulation:  Reviewed for med. necessity/level of care/duration of stay  If discussed at [place] of Stay Meetings, dates discussed:    Comments:

## 2013-11-03 NOTE — Discharge Instructions (Signed)
Home Health Services arranged with [place]. [phone]. Registered Nurse, Physical and Occupational Therapy

## 2013-11-04 ENCOUNTER — Encounter (HOSPITAL_COMMUNITY): Payer: Self-pay | Admitting: Gastroenterology

## 2013-11-06 LAB — CULTURE, BLOOD (ROUTINE X 2)
CULTURE: NO GROWTH
CULTURE: NO GROWTH

## 2013-11-24 NOTE — Progress Notes (Signed)
[place] agency callled- requested orders for [facility] RN for diabetes education and management- given  Erin Hearing, ANP

## 2013-11-27 NOTE — Progress Notes (Signed)
signed

## 2013-12-15 ENCOUNTER — Other Ambulatory Visit (HOSPITAL_COMMUNITY): Payer: Self-pay | Admitting: Internal Medicine

## 2013-12-15 DIAGNOSIS — R1310 Dysphagia, unspecified: Secondary | ICD-10-CM

## 2013-12-16 ENCOUNTER — Other Ambulatory Visit: Payer: Self-pay | Admitting: Internal Medicine

## 2013-12-16 DIAGNOSIS — J189 Pneumonia, unspecified organism: Secondary | ICD-10-CM

## 2013-12-17 ENCOUNTER — Encounter: Payer: Self-pay | Admitting: Internal Medicine

## 2013-12-17 ENCOUNTER — Ambulatory Visit (INDEPENDENT_AMBULATORY_CARE_PROVIDER_SITE_OTHER): Payer: Medicare Other | Admitting: Internal Medicine

## 2013-12-17 ENCOUNTER — Other Ambulatory Visit (INDEPENDENT_AMBULATORY_CARE_PROVIDER_SITE_OTHER): Payer: Medicare Other

## 2013-12-17 VITALS — BP 110/72 | HR 92 | Ht 70.0 in | Wt 145.0 lb

## 2013-12-17 DIAGNOSIS — J90 Pleural effusion, not elsewhere classified: Secondary | ICD-10-CM

## 2013-12-17 DIAGNOSIS — E119 Type 2 diabetes mellitus without complications: Secondary | ICD-10-CM

## 2013-12-17 DIAGNOSIS — R1310 Dysphagia, unspecified: Secondary | ICD-10-CM

## 2013-12-17 DIAGNOSIS — J189 Pneumonia, unspecified organism: Secondary | ICD-10-CM

## 2013-12-17 LAB — LACTATE DEHYDROGENASE: LDH: 130 U/L (ref 94–250)

## 2013-12-17 LAB — CBC
HEMATOCRIT: 38.6 % — AB (ref 39.0–52.0)
HEMOGLOBIN: 12.8 g/dL — AB (ref 13.0–17.0)
MCHC: 33.2 g/dL (ref 30.0–36.0)
MCV: 101.3 fl — AB (ref 78.0–100.0)
Platelets: 220 10*3/uL (ref 150.0–400.0)
RBC: 3.81 Mil/uL — AB (ref 4.22–5.81)
RDW: 14.2 % (ref 11.5–14.6)
WBC: 9.2 10*3/uL (ref 4.5–10.5)

## 2013-12-17 LAB — ALBUMIN: Albumin: 4 g/dL (ref 3.5–5.2)

## 2013-12-17 LAB — PROTEIN, TOTAL: Total Protein: 7 g/dL (ref 6.0–8.3)

## 2013-12-17 LAB — GLUCOSE, RANDOM: Glucose, Bld: 117 mg/dL — ABNORMAL HIGH (ref 70–99)

## 2013-12-17 LAB — LIPASE: Lipase: 25 U/L (ref 11.0–59.0)

## 2013-12-17 NOTE — Patient Instructions (Addendum)
#  Dysphagia  - refer back to [facility] GI for dysphagia evaluation; concern this might cause of LLL pneumonia  #Left pleural effusion  - IR guided thoracentesis  By ultrasound - The fluid removal will be done by Interventional Radiology  - stop xarelto 48 hours prior to procedure and resume 24h after procedure   - they will remove not more than 1.5L  - fluid labs to be sent: cell count, gram stain and culture, cytology for malignant cells, chemistries for LDH, albumin,  Protein, glucose, triglyceride and lipase  - blood labs to be sent on same day / time: cbc, ldh, protein, albumin glucose, and lipase  #left sided pneumonia (updated plan 12/21/2013)  - will get CT chest after thora (currently scheduled pre-thora)  #Followup  - after thoracentesis with myself or NP Tammy

## 2013-12-17 NOTE — Progress Notes (Signed)
Subjective:    Patient ID: [name], male    DOB: 1928-06-24, 80 y.o.   MRN: [id number]  HPI  IOV 12/17/2013  Chief Complaint  Patient presents with  . Pulmonary Consult    Referred by [name] for PNA , COPD and trouble swollowing    80 year old male accompanied by his wife. He has multiple medical problems including macular degeneration, para arterial disease, hypertension, diabetes, atrial fibrillation. In October 2014 he developed a stroke despite being therapeutic on Coumadin. He was subsequent he switched to xarelto. Note, around the time of the stroke he probably fell down and sustained some rib fractures Since his stroke he has recovered and is able to do activities of daily living at a basic level except for one-sided weakness with residual left-sided mild hemiplegia. However, in February 2015 he was admitted for 5 days for new onset left lower lobe pneumonia [confirmed on CT scan of the chest February 2015]. It was diagnosed as a community-acquired pneumonia and treated as such. During this time he did have some anemia for which he was Hemoccult positive. He underwent an endoscopy by [facility] gastroenterology services and this was normal. It appears that at the time of followup with [name] his primary care physician this left lower lobe density has persisted along with pleural effusion and therefore he is been referred here  He and his wife are completely unsure why they're here today but they do admit to dysphagia and some mild chronic cough with white sputum. The dysphagia is new since the stroke. They're not sure if she did have a swallow evaluation but they think there might of had one while an inpatient and this was normal. That cough and white sputum are new since his discharge from pneumonia in early February 2015   Body mass index is 20.81 kg/(m^2).   reports that he quit smoking about 32 years ago. His smoking use included Cigarettes. He has a 105 pack-year smoking  history. He has never used smokeless tobacco.   Echocardiogram October 2014 shows cor pulmonale  Past Medical History  Diagnosis Date  . Diabetes mellitus without complication   . Dysrhythmia     atrial fibrilation  . Hypertension   . Shortness of breath   . Peripheral vascular disease   . Claudication of calf muscles 11/05/2012    right calf  . GERD (gastroesophageal reflux disease)   . Arthritis   . TIA (transient ischemic attack) 2010     No family history on file.   History   Social History  . Marital Status: Married    Spouse Name: N/A    Number of Children: N/A  . Years of Education: N/A   Occupational History  . Not on file.   Social History Main Topics  . Smoking status: Former Smoker -- 3.00 packs/day for 35 years    Types: Cigarettes    Quit date: 09/23/1981  . Smokeless tobacco: Never Used  . Alcohol Use: No     Comment: QUITS YEARS AGO  . Drug Use: No  . Sexual Activity: Not on file   Other Topics Concern  . Not on file   Social History Narrative  . No narrative on file     No Known Allergies   Outpatient Prescriptions Prior to Visit  Medication Sig Dispense Refill  . albuterol (PROVENTIL HFA;VENTOLIN HFA) 108 (90 BASE) MCG/ACT inhaler Inhale 2 puffs into the lungs every 6 (six) hours as needed for wheezing or shortness  of breath.  1 Inhaler  2  . carvedilol (COREG) 12.5 MG tablet Take 1 tablet (12.5 mg total) by mouth 2 (two) times daily with a meal.  60 tablet  0  . cilostazol (PLETAL) 50 MG tablet Take 50 mg by mouth 2 (two) times daily.      Marland Kitchen levofloxacin (LEVAQUIN) 750 MG tablet Take 1 tablet (750 mg total) by mouth daily. X 1 week  7 tablet  0  . loratadine (CLARITIN) 10 MG tablet Take 10 mg by mouth daily.      . metFORMIN (GLUCOPHAGE) 1000 MG tablet Take 1 tablet (1,000 mg total) by mouth 2 (two) times daily with a meal.      . Rivaroxaban (XARELTO) 20 MG TABS tablet Take 1 tablet (20 mg total) by mouth daily with supper.  30 tablet  0   . simvastatin (ZOCOR) 40 MG tablet Take 40 mg by mouth every evening.      . tiotropium (SPIRIVA) 18 MCG inhalation capsule Place 18 mcg into inhaler and inhale daily.      Marland Kitchen glipiZIDE (GLUCOTROL XL) 5 MG 24 hr tablet Take 5 mg by mouth daily.      . Multiple Vitamins-Minerals (PRESERVISION AREDS PO) Take 1 capsule by mouth daily.      Marland Kitchen olmesartan (BENICAR) 40 MG tablet Take 1 tablet (40 mg total) by mouth daily.      Marland Kitchen saccharomyces boulardii (FLORASTOR) 250 MG capsule Take 1 capsule (250 mg total) by mouth 2 (two) times daily.  60 capsule  1   No facility-administered medications prior to visit.      Review of Systems  Constitutional: Positive for appetite change and unexpected weight change. Negative for fever.  HENT: Positive for trouble swallowing. Negative for congestion, dental problem, ear pain, nosebleeds, postnasal drip, rhinorrhea, sinus pressure, sneezing and sore throat.   Eyes: Negative for redness and itching.  Respiratory: Positive for shortness of breath. Negative for cough, chest tightness and wheezing.   Cardiovascular: Positive for palpitations and leg swelling.       Left foot  Gastrointestinal: Negative for nausea and vomiting.  Genitourinary: Negative for dysuria.  Musculoskeletal: Negative for joint swelling.  Skin: Negative for rash.  Neurological: Negative for headaches.  Hematological: Bruises/bleeds easily.  Psychiatric/Behavioral: Negative for dysphoric mood. The patient is not nervous/anxious.        Objective:   Physical Exam  Nursing note and vitals reviewed. Constitutional: He is oriented to person, place, and time. He appears well-developed and well-nourished. No distress.  Body mass index is 20.81 kg/(m^2). Frail male sitting in wheel chair  HENT:  Head: Normocephalic and atraumatic.  Right Ear: External ear normal.  Left Ear: External ear normal.  Mouth/Throat: Oropharynx is clear and moist. No oropharyngeal exudate.  ? Wart on nose   Eyes: Conjunctivae and EOM are normal. Pupils are equal, round, and reactive to light. Right eye exhibits no discharge. Left eye exhibits no discharge. No scleral icterus.  Neck: Normal range of motion. Neck supple. No JVD present. No tracheal deviation present. No thyromegaly present.  Cardiovascular: Normal rate, regular rhythm and intact distal pulses.  Exam reveals no gallop and no friction rub.   No murmur heard. Pulmonary/Chest: Effort normal and breath sounds normal. No respiratory distress. He has no wheezes. He has no rales. He exhibits no tenderness.  Diminished air entry on left base  Abdominal: Soft. Bowel sounds are normal. He exhibits no distension and no mass. There is no tenderness. There  is no rebound and no guarding.  Musculoskeletal: Normal range of motion. He exhibits no edema and no tenderness.  Arm in sling  Lymphadenopathy:    He has no cervical adenopathy.  Neurological: He is alert and oriented to person, place, and time. No cranial nerve deficit.  Left side weak  Skin: Skin is warm and dry. No rash noted. He is not diaphoretic. No erythema. No pallor.  Psychiatric: He has a normal mood and affect. His behavior is normal. Judgment and thought content normal.          Assessment & Plan:  \

## 2013-12-20 ENCOUNTER — Ambulatory Visit (HOSPITAL_COMMUNITY)
Admission: RE | Admit: 2013-12-20 | Discharge: 2013-12-20 | Disposition: A | Discharge status: Home / Self Care | Payer: Medicare Other | Source: Ambulatory Visit | Attending: Internal Medicine | Admitting: Internal Medicine

## 2013-12-20 DIAGNOSIS — R1310 Dysphagia, unspecified: Secondary | ICD-10-CM | POA: Insufficient documentation

## 2013-12-20 DIAGNOSIS — R1313 Dysphagia, pharyngeal phase: Secondary | ICD-10-CM | POA: Insufficient documentation

## 2013-12-20 NOTE — Procedures (Signed)
Objective Swallowing Evaluation: Modified Barium Swallowing Study  Patient Details  Name: [name] MRN: [id number] Date of Birth: 1928/01/26  Today's Date: 12/20/2013 Time: 0093-8182 SLP Time Calculation (min): 20 min  Past Medical History:  Past Medical History  Diagnosis Date  . Diabetes mellitus without complication   . Dysrhythmia     atrial fibrilation  . Hypertension   . Shortness of breath   . Peripheral vascular disease   . Claudication of calf muscles 11/05/2012    right calf  . GERD (gastroesophageal reflux disease)   . Arthritis   . TIA (transient ischemic attack) 2010   Past Surgical History:  Past Surgical History  Procedure Laterality Date  . Foot surgery      foot surgery due to broken foot years ago  . Esophagogastroduodenoscopy N/A 11/02/2013    Procedure: ESOPHAGOGASTRODUODENOSCOPY (EGD);  Surgeon: Cleotis Nipper, MD;  Location: [facility] ENDOSCOPY;  Service: Endoscopy;  Laterality: N/A;   HPI:  80 y/o male seen for OP MBS due to recurrent PNAs. Per wife who was present for study, patient with h/o CVA in september of 2014 with mild initial swallowing difficulty which resolved. Also with h/o COPD, diabetes and chronic dry mouth.      Assessment / Plan / Recommendation Clinical Impression  Dysphagia Diagnosis: Moderate pharyngeal phase dysphagia;Mild pharyngeal phase dysphagia Clinical impression: Patient presents with a mild-moderate sensori-motor based pharyngeal dysphagia characterized by combination of delayed swallow initiation and base of tongue/laryngeal/pharyngeal weakness resulting in decreased airway protection with thin liquids and mild post-swallow residuals. SLP provided moderate-max cueing for use of chin tuck and altered bolus sizes which did not improve ability to protect airway. Patient was able to protect airway with solids and nectar thick liquids.        Diet Recommendation Regular;Nectar-thick liquid   Liquid Administration via: Cup;No  straw Medication Administration: Whole meds with puree Supervision: Patient able to self feed Compensations: Slow rate;Small sips/bites;Multiple dry swallows after each bite/sip Postural Changes and/or Swallow Maneuvers: Seated upright 90 degrees    Other  Recommendations Oral Care Recommendations: Oral care BID   Follow Up Recommendations  Outpatient SLP            General HPI: 80 y/o male seen for OP MBS due to recurrent PNAs. Per wife who was present for study, patient with h/o CVA in september of 2014 with mild initial swallowing difficulty which resolved. Also with h/o COPD, diabetes and chronic dry mouth.  Type of Study: Modified Barium Swallowing Study Reason for Referral: Objectively evaluate swallowing function Diet Prior to this Study: Regular;Thin liquids Temperature Spikes Noted: No Respiratory Status: Room air History of Recent Intubation: No Behavior/Cognition: Alert;Cooperative;Pleasant mood Oral Cavity - Dentition: Adequate natural dentition Oral Motor / Sensory Function: Within functional limits Self-Feeding Abilities: Able to feed self Patient Positioning: Upright in chair Baseline Vocal Quality: Clear Volitional Cough: Strong Volitional Swallow: Able to elicit Anatomy: Within functional limits Pharyngeal Secretions: Not observed secondary MBS    Reason for Referral Objectively evaluate swallowing function   Oral Phase Oral Preparation/Oral Phase Oral Phase: WFL   Pharyngeal Phase Pharyngeal Phase Pharyngeal Phase: Impaired Pharyngeal - Nectar Pharyngeal - Nectar Cup: Delayed swallow initiation;Premature spillage to valleculae;Reduced laryngeal elevation;Reduced anterior laryngeal mobility;Reduced pharyngeal peristalsis;Reduced tongue base retraction;Pharyngeal residue - valleculae;Pharyngeal residue - pyriform sinuses Pharyngeal - Thin Pharyngeal - Thin Cup: Delayed swallow initiation;Reduced laryngeal elevation;Reduced anterior laryngeal mobility;Reduced  tongue base retraction;Pharyngeal residue - valleculae;Pharyngeal residue - pyriform sinuses;Premature spillage to pyriform sinuses;Reduced pharyngeal peristalsis;Penetration/Aspiration  during swallow Penetration/Aspiration details (thin cup): Material enters airway, CONTACTS cords and not ejected out Pharyngeal - Solids Pharyngeal - Puree: Delayed swallow initiation;Premature spillage to valleculae;Reduced laryngeal elevation;Reduced anterior laryngeal mobility;Reduced pharyngeal peristalsis;Reduced tongue base retraction;Pharyngeal residue - valleculae;Pharyngeal residue - pyriform sinuses Pharyngeal - Mechanical Soft: Delayed swallow initiation;Premature spillage to valleculae;Reduced laryngeal elevation;Reduced anterior laryngeal mobility;Reduced pharyngeal peristalsis;Reduced tongue base retraction;Pharyngeal residue - valleculae;Pharyngeal residue - pyriform sinuses Pharyngeal - Pill: Delayed swallow initiation;Premature spillage to valleculae;Reduced laryngeal elevation;Reduced anterior laryngeal mobility;Reduced pharyngeal peristalsis;Reduced tongue base retraction (provided whole in puree)  Cervical Esophageal Phase    GO    Cervical Esophageal Phase Cervical Esophageal Phase: Impaired Cervical Esophageal Phase - Nectar Nectar Cup: Reduced cricopharyngeal relaxation Cervical Esophageal Phase - Thin Thin Cup: Reduced cricopharyngeal relaxation Cervical Esophageal Phase - Solids Puree: Reduced cricopharyngeal relaxation Mechanical Soft: Reduced cricopharyngeal relaxation Pill: Reduced cricopharyngeal relaxation    Functional Assessment Tool Used: skilled clinical judgement Functional Limitations: Swallowing Swallow Current Status (Y6948): At least 20 percent but less than 40 percent impaired, limited or restricted Swallow Goal Status [phone]): At least 20 percent but less than 40 percent impaired, limited or restricted Swallow Discharge Status [phone]): At least 20 percent but less  than 40 percent impaired, limited or restricted   [place], CCC-SLP [phone]  Gabriel Rainwater Meryl 12/20/2013, 1:52 PM

## 2013-12-21 ENCOUNTER — Telehealth: Payer: Self-pay | Admitting: Internal Medicine

## 2013-12-21 DIAGNOSIS — R1310 Dysphagia, unspecified: Secondary | ICD-10-CM | POA: Insufficient documentation

## 2013-12-21 DIAGNOSIS — J90 Pleural effusion, not elsewhere classified: Secondary | ICD-10-CM

## 2013-12-21 NOTE — Telephone Encounter (Signed)
For action 12/21/2013, sending to triage who can forward to [place] but needs action 12/21/2013   He is scheduled by his PCP [name], MD a CT for 4/1//15 of chest. Noticed it only now but I would prefer he get it A) without contrast; b) do it after his thora on 12/24/13 - preferrably same day or within 48h after thora   Thanks  Dr. Brand Males, M.D., [facility].C.P Pulmonary and Critical Care Medicine Staff Physician [place] [facility] Pager: [phone], If no answer or between  15:00h - 7:00h: call [phone]  12/21/2013 2:35 AM

## 2013-12-21 NOTE — Assessment & Plan Note (Signed)
#  left sided pneumonia   - originally seen LLL in feb 2015 but still with persistent LLL density as of late march 2015. He is up for repeat Ct but  will get CT chest after thora (currently scheduled pre-thora). This can help with identify underlying mass if any  #Followup  - after thoracentesis with myself or NP Tammy

## 2013-12-21 NOTE — Assessment & Plan Note (Addendum)
#  Left pleural effusion  - need to understand if this is reactive effusion or not  PLAN   - IR guided thoracentesis  By ultrasound - The fluid removal will be done by Interventional Radiology  - stop xarelto 48 hours prior to procedure and resume 24h after procedure   - they will remove not more than 1.5L  - fluid labs to be sent: cell count, gram stain and culture, cytology for malignant cells, chemistries for LDH, albumin,  Protein, glucose, triglyceride and lipase  - blood labs to be sent on same day / time: cbc, ldh, protein, albumin glucose, and lipase  Risks of thora explained: he and wife wish to proceed

## 2013-12-21 NOTE — Telephone Encounter (Signed)
Order placed to cancel CT on 4-1 and to change to 4-3. Pt is aware. Haywood Bing, CMA

## 2013-12-21 NOTE — Assessment & Plan Note (Signed)
#  Dysphagia  - refer back to [facility] for dysphagia evaluation; concern this might cause of LLL pneumonia

## 2013-12-22 ENCOUNTER — Other Ambulatory Visit: Payer: Medicare Other

## 2013-12-24 ENCOUNTER — Ambulatory Visit (HOSPITAL_COMMUNITY)
Admission: RE | Admit: 2013-12-24 | Discharge: 2013-12-24 | Disposition: A | Discharge status: Home / Self Care | Payer: Medicare Other | Source: Ambulatory Visit | Attending: Internal Medicine | Admitting: Internal Medicine

## 2013-12-24 DIAGNOSIS — J90 Pleural effusion, not elsewhere classified: Secondary | ICD-10-CM

## 2013-12-24 DIAGNOSIS — R1310 Dysphagia, unspecified: Secondary | ICD-10-CM

## 2013-12-24 LAB — ALBUMIN, FLUID (OTHER): Albumin, Fluid: 2.5 g/dL

## 2013-12-24 LAB — LACTATE DEHYDROGENASE: LDH: 148 U/L (ref 94–250)

## 2013-12-24 LAB — PROTEIN, TOTAL: TOTAL PROTEIN: 7.1 g/dL (ref 6.0–8.3)

## 2013-12-24 LAB — CBC
HEMATOCRIT: 37.6 % — AB (ref 39.0–52.0)
Hemoglobin: 12.9 g/dL — ABNORMAL LOW (ref 13.0–17.0)
MCH: 34 pg (ref 26.0–34.0)
MCHC: 34.3 g/dL (ref 30.0–36.0)
MCV: 99.2 fL (ref 78.0–100.0)
Platelets: 219 10*3/uL (ref 150–400)
RBC: 3.79 MIL/uL — ABNORMAL LOW (ref 4.22–5.81)
RDW: 13 % (ref 11.5–15.5)
WBC: 6 10*3/uL (ref 4.0–10.5)

## 2013-12-24 LAB — BODY FLUID CELL COUNT WITH DIFFERENTIAL
Eos, Fluid: 0 %
Lymphs, Fluid: 56 %
MONOCYTE-MACROPHAGE-SEROUS FLUID: 26 % — AB (ref 50–90)
Neutrophil Count, Fluid: 13 % (ref 0–25)
WBC FLUID: 682 uL (ref 0–1000)

## 2013-12-24 LAB — ALBUMIN: Albumin: 3.5 g/dL (ref 3.5–5.2)

## 2013-12-24 LAB — GLUCOSE, RANDOM: Glucose, Bld: 114 mg/dL — ABNORMAL HIGH (ref 70–99)

## 2013-12-24 LAB — PROTEIN, BODY FLUID: Total protein, fluid: 4 g/dL

## 2013-12-24 LAB — LIPASE, BLOOD: LIPASE: 23 U/L (ref 11–59)

## 2013-12-24 LAB — GLUCOSE, SEROUS FLUID: Glucose, Fluid: 117 mg/dL

## 2013-12-24 NOTE — Procedures (Addendum)
Successful US guided left thoracentesis. Yielded 1.2 liters of amber/blood tinged fluid. Pt tolerated procedure well. No immediate complications.  Specimen was sent for labs. Patient scheduled for chest CT post procedure.  Tsosie Billing D PA-C 12/24/2013 11:24 AM

## 2013-12-28 LAB — BODY FLUID CULTURE: Culture: NO GROWTH

## 2013-12-29 ENCOUNTER — Emergency Department (HOSPITAL_COMMUNITY)
Admission: EM | Admit: 2013-12-29 | Discharge: 2013-12-29 | Disposition: A | Discharge status: Home / Self Care | Payer: Medicare Other | Source: Home / Self Care | Attending: Emergency Medicine | Admitting: Emergency Medicine

## 2013-12-29 ENCOUNTER — Ambulatory Visit: Payer: Medicare Other | Admitting: Adult Health

## 2013-12-29 ENCOUNTER — Encounter (HOSPITAL_COMMUNITY): Payer: Self-pay | Admitting: Emergency Medicine

## 2013-12-29 ENCOUNTER — Emergency Department (HOSPITAL_COMMUNITY): Payer: Medicare Other

## 2013-12-29 DIAGNOSIS — I10 Essential (primary) hypertension: Secondary | ICD-10-CM | POA: Insufficient documentation

## 2013-12-29 DIAGNOSIS — M25559 Pain in unspecified hip: Secondary | ICD-10-CM | POA: Insufficient documentation

## 2013-12-29 DIAGNOSIS — Z7901 Long term (current) use of anticoagulants: Secondary | ICD-10-CM | POA: Insufficient documentation

## 2013-12-29 DIAGNOSIS — Z79899 Other long term (current) drug therapy: Secondary | ICD-10-CM

## 2013-12-29 DIAGNOSIS — I4891 Unspecified atrial fibrillation: Secondary | ICD-10-CM

## 2013-12-29 DIAGNOSIS — Z8673 Personal history of transient ischemic attack (TIA), and cerebral infarction without residual deficits: Secondary | ICD-10-CM | POA: Insufficient documentation

## 2013-12-29 DIAGNOSIS — K219 Gastro-esophageal reflux disease without esophagitis: Secondary | ICD-10-CM | POA: Insufficient documentation

## 2013-12-29 DIAGNOSIS — Z8739 Personal history of other diseases of the musculoskeletal system and connective tissue: Secondary | ICD-10-CM

## 2013-12-29 DIAGNOSIS — E119 Type 2 diabetes mellitus without complications: Secondary | ICD-10-CM | POA: Insufficient documentation

## 2013-12-29 DIAGNOSIS — M79609 Pain in unspecified limb: Secondary | ICD-10-CM

## 2013-12-29 DIAGNOSIS — M79651 Pain in right thigh: Secondary | ICD-10-CM

## 2013-12-29 DIAGNOSIS — Z87891 Personal history of nicotine dependence: Secondary | ICD-10-CM

## 2013-12-29 DIAGNOSIS — E860 Dehydration: Secondary | ICD-10-CM

## 2013-12-29 LAB — I-STAT CHEM 8, ED
BUN: 26 mg/dL — AB (ref 6–23)
CREATININE: 0.9 mg/dL (ref 0.50–1.35)
Calcium, Ion: 1.16 mmol/L (ref 1.13–1.30)
Chloride: 101 mEq/L (ref 96–112)
Glucose, Bld: 173 mg/dL — ABNORMAL HIGH (ref 70–99)
HCT: 33 % — ABNORMAL LOW (ref 39.0–52.0)
Hemoglobin: 11.2 g/dL — ABNORMAL LOW (ref 13.0–17.0)
POTASSIUM: 4.5 meq/L (ref 3.7–5.3)
Sodium: 139 mEq/L (ref 137–147)
TCO2: 25 mmol/L (ref 0–100)

## 2013-12-29 MED ORDER — SODIUM CHLORIDE 0.9 % IV BOLUS (SEPSIS)
500.0000 mL | Freq: Once | INTRAVENOUS | Status: AC
  Administered 2013-12-29: 500 mL via INTRAVENOUS

## 2013-12-29 MED ORDER — METHOCARBAMOL 100 MG/ML IJ SOLN
500.0000 mg | Freq: Once | INTRAVENOUS | Status: AC
  Administered 2013-12-29: 500 mg via INTRAVENOUS
  Filled 2013-12-29: qty 5

## 2013-12-29 MED ORDER — METHOCARBAMOL 500 MG PO TABS: 500.0000 mg | ORAL_TABLET | Freq: Three times a day (TID) | ORAL | Status: DC | PRN

## 2013-12-29 MED ORDER — METHOCARBAMOL 100 MG/ML IJ SOLN
500.0000 mg | Freq: Once | INTRAMUSCULAR | Status: DC
  Filled 2013-12-29: qty 5

## 2013-12-29 MED ORDER — HYDROCODONE-ACETAMINOPHEN 5-325 MG PO TABS
1.0000 | ORAL_TABLET | Freq: Once | ORAL | Status: AC
  Administered 2013-12-29: 1 via ORAL
  Filled 2013-12-29: qty 1

## 2013-12-29 NOTE — ED Notes (Signed)
Called pt wife and she is enroute to pick up pt now

## 2013-12-29 NOTE — Progress Notes (Signed)
VASCULAR LAB PRELIMINARY  PRELIMINARY  PRELIMINARY  PRELIMINARY  Right lower extremity venous duplex completed.    Preliminary report:  Right:  No evidence of DVT, superficial thrombosis, or Baker's cyst.  [name] [place], RVS 12/29/2013, 9:13 AM

## 2013-12-29 NOTE — ED Notes (Signed)
Pt has had a stroke in the past and uses a walker to get around the house

## 2013-12-29 NOTE — ED Notes (Signed)
Returned from vascular

## 2013-12-29 NOTE — ED Notes (Signed)
Patient transported to X-ray 

## 2013-12-29 NOTE — ED Notes (Signed)
Pt here from with c/o right leg pain that started this am , pt has not fallen , pain starts in his knee and radiates up to the thigh, no pain at the hip , pt is able to move leg with minimal pain

## 2013-12-29 NOTE — ED Provider Notes (Signed)
CSN: [id number]     Arrival date & time 12/29/13  0744 History   First MD Initiated Contact with Patient 12/29/13 [phone]     Chief Complaint  Patient presents with  . Leg Pain     (Consider location/radiation/quality/duration/timing/severity/associated sxs/prior Treatment) The history is provided by the patient and the spouse.    Patient presents with right thigh and hip pain.  Pain began suddenly while patient was in bed, is cramping in nature, worse with extending the knee, 5/10 intensity.  Denies falls, leg swelling, weakness or numbness of the leg.  Pt has ongoing issues with pleural effusion, pt denies any change in his SOB.  Denies CP, fever.    Wife now bedside.  9:32 AM  Wife states patient has a hospital bed at home, is constantly sliding down and having to push himself back up in bed.  She denies any other changes or complaints recently.    Past Medical History  Diagnosis Date  . Diabetes mellitus without complication   . Dysrhythmia     atrial fibrilation  . Hypertension   . Shortness of breath   . Peripheral vascular disease   . Claudication of calf muscles 11/05/2012    right calf  . GERD (gastroesophageal reflux disease)   . Arthritis   . TIA (transient ischemic attack) 2010   Past Surgical History  Procedure Laterality Date  . Foot surgery      foot surgery due to broken foot years ago  . Esophagogastroduodenoscopy N/A 11/02/2013    Procedure: ESOPHAGOGASTRODUODENOSCOPY (EGD);  Surgeon: Cleotis Nipper, MD;  Location: [facility] ENDOSCOPY;  Service: Endoscopy;  Laterality: N/A;   History reviewed. No pertinent family history. History  Substance Use Topics  . Smoking status: Former Smoker -- 3.00 packs/day for 35 years    Types: Cigarettes    Quit date: 09/23/1981  . Smokeless tobacco: Never Used  . Alcohol Use: No     Comment: QUITS YEARS AGO    Review of Systems  Cardiovascular: Negative for chest pain.  Gastrointestinal: Negative for abdominal pain.    Musculoskeletal: Positive for arthralgias.  Skin: Negative for color change and wound.  All other systems reviewed and are negative.     Allergies  Review of patient's allergies indicates no known allergies.  Home Medications   Current Outpatient Rx  Name  Route  Sig  Dispense  Refill  . acetaminophen (TYLENOL) 325 MG tablet   Oral   Take 650 mg by mouth every 6 (six) hours as needed.         Marland Kitchen albuterol (PROVENTIL HFA;VENTOLIN HFA) 108 (90 BASE) MCG/ACT inhaler   Inhalation   Inhale 2 puffs into the lungs every 6 (six) hours as needed for wheezing or shortness of breath.   1 Inhaler   2   . amLODipine (NORVASC) 5 MG tablet   Oral   Take 5 mg by mouth daily.         . budesonide (PULMICORT) 0.5 MG/2ML nebulizer solution   Nebulization   Take 0.5 mg by nebulization 2 (two) times daily.         . carvedilol (COREG) 12.5 MG tablet   Oral   Take 1 tablet (12.5 mg total) by mouth 2 (two) times daily with a meal.   60 tablet   0   . cilostazol (PLETAL) 50 MG tablet   Oral   Take 50 mg by mouth 2 (two) times daily.         Marland Kitchen  levofloxacin (LEVAQUIN) 750 MG tablet   Oral   Take 1 tablet (750 mg total) by mouth daily. X 1 week   7 tablet   0   . loratadine (CLARITIN) 10 MG tablet   Oral   Take 10 mg by mouth daily.         . Magnesium 100 MG CAPS   Oral   Take 1 capsule by mouth daily.         . Magnesium 200 MG TABS   Oral   Take 1 tablet by mouth daily.         . metFORMIN (GLUCOPHAGE) 1000 MG tablet   Oral   Take 1 tablet (1,000 mg total) by mouth 2 (two) times daily with a meal.         . ranitidine (ZANTAC) 300 MG tablet   Oral   Take 300 mg by mouth at bedtime.         . Rivaroxaban (XARELTO) 20 MG TABS tablet   Oral   Take 1 tablet (20 mg total) by mouth daily with supper.   30 tablet   0   . sertraline (ZOLOFT) 50 MG tablet   Oral   Take 50 mg by mouth daily.         . simvastatin (ZOCOR) 40 MG tablet   Oral   Take 40  mg by mouth every evening.         . tiotropium (SPIRIVA) 18 MCG inhalation capsule   Inhalation   Place 18 mcg into inhaler and inhale daily.         . vitamin B-12 (CYANOCOBALAMIN) 1000 MCG tablet   Oral   Take 1,000 mcg by mouth daily.          BP 140/81  Pulse 94  Temp(Src) 97.7 F (36.5 C) (Oral)  Resp 18  Ht 5\' 5"  (1.651 m)  Wt 144 lb (65.318 kg)  BMI 23.96 kg/m2  SpO2 95% Physical Exam  Nursing note and vitals reviewed. Constitutional: He appears well-developed and well-nourished. No distress.  Uncomfortable appearing  HENT:  Head: Normocephalic and atraumatic.  Mouth/Throat: Mucous membranes are dry.  Neck: Neck supple.  Pulmonary/Chest: Effort normal.  Abdominal: Soft. There is no tenderness.  Musculoskeletal:       Right hip: He exhibits tenderness. He exhibits normal range of motion, no swelling, no crepitus and no deformity.       Right knee: Normal.       Right upper leg: He exhibits tenderness. He exhibits no swelling, no edema, no deformity and no laceration.       Right lower leg: Normal.       Legs: RLE without edema, no skin changes. No erythema, edema, warmth.  Distal pulses and sensation intact.  Passive ROM of hip without significant pain.    Neurological: He is alert.  Skin: He is not diaphoretic.    ED Course  Procedures (including critical care time) Labs Review Labs Reviewed  I-STAT CHEM 8, ED - Abnormal; Notable for the following:    BUN 26 (*)    Glucose, Bld 173 (*)    Hemoglobin 11.2 (*)    HCT 33.0 (*)    All other components within normal limits   Imaging Review Dg Hip Complete Right  12/29/2013   CLINICAL DATA:  Sudden onset of a traumatic right hip discomfort  EXAM: RIGHT HIP - COMPLETE 2+ VIEW  COMPARISON:  None.  FINDINGS: The bony pelvis as well as both  hips exhibit very mild osteopenia. There is narrowing of the hip joint space on the right as compared to the left. AP and lateral views of the right hip revealed no  evidence of an acute fracture. The bony pelvis exhibits no acute abnormality.  IMPRESSION: There is moderate degenerative joint space narrowing of the right hip as compared to the left consistent with osteoarthritis. There is no acute bony abnormality.   Electronically Signed   By: David  [place]   On: 12/29/2013 08:40     EKG Interpretation None      Doppler US is negative for DVT  10:12 AM Discussed pt with Dr Audie Pinto.   12:03 PM Pt reports pain is now half of what it was with 500mg  robaxin.   MDM   Final diagnoses:  Right thigh pain  Dehydration, mild   Afebrile, nontoxic patient with right thigh pain that woke him from sleep. Suspect muscle cramp from dehydration.  His is clinically dry, BUN elevated.  Leg has no swelling, no focal tenderness, no bony tenderness.  Distal pulses are intact.  Xray shows mild degenerative changes.  Doppler US is negative for DVT.  Pt feels better after robaxin.  D/C home with same, PCP follow up.  Discussed result, findings, treatment, and follow up  with patient.  Pt given return precautions.  Pt verbalizes understanding and agrees with plan.        Clayton Bibles, PA-C 12/29/13 1538

## 2013-12-29 NOTE — Discharge Instructions (Signed)
Read the information below.  Use the prescribed medication as directed.  Please discuss all new medications with your pharmacist.  You may return to the Emergency Department at any time for worsening condition or any new symptoms that concern you.  If you develop uncontrolled pain, weakness or numbness of the extremity, severe discoloration of the skin, or you are unable to walk, return to the ER for a recheck.      Musculoskeletal Pain Musculoskeletal pain is muscle and boney aches and pains. These pains can occur in any part of the body. Your caregiver may treat you without knowing the cause of the pain. They may treat you if blood or urine tests, X-rays, and other tests were normal.  CAUSES There is often not a definite cause or reason for these pains. These pains may be caused by a type of germ (virus). The discomfort may also come from overuse. Overuse includes working out too hard when your body is not fit. Boney aches also come from weather changes. Bone is sensitive to atmospheric pressure changes. HOME CARE INSTRUCTIONS   Ask when your test results will be ready. Make sure you get your test results.  Only take over-the-counter or prescription medicines for pain, discomfort, or fever as directed by your caregiver. If you were given medications for your condition, do not drive, operate machinery or power tools, or sign legal documents for 24 hours. Do not drink alcohol. Do not take sleeping pills or other medications that may interfere with treatment.  Continue all activities unless the activities cause more pain. When the pain lessens, slowly resume normal activities. Gradually increase the intensity and duration of the activities or exercise.  During periods of severe pain, bed rest may be helpful. Lay or sit in any position that is comfortable.  Putting ice on the injured area.  Put ice in a bag.  Place a towel between your skin and the bag.  Leave the ice on for 15 to 20 minutes, 3  to 4 times a day.  Follow up with your caregiver for continued problems and no reason can be found for the pain. If the pain becomes worse or does not go away, it may be necessary to repeat tests or do additional testing. Your caregiver may need to look further for a possible cause. SEEK IMMEDIATE MEDICAL CARE IF:  You have pain that is getting worse and is not relieved by medications.  You develop chest pain that is associated with shortness or breath, sweating, feeling sick to your stomach (nauseous), or throw up (vomit).  Your pain becomes localized to the abdomen.  You develop any new symptoms that seem different or that concern you. MAKE SURE YOU:   Understand these instructions.  Will watch your condition.  Will get help right away if you are not doing well or get worse. Document Released: 09/09/2005 Document Revised: 12/02/2011 Document Reviewed: 05/14/2013 [facility] Patient Information 2014 [place].  Dehydration, Adult Dehydration is when you lose more fluids from the body than you take in. Vital organs like the kidneys, brain, and heart cannot function without a proper amount of fluids and salt. Any loss of fluids from the body can cause dehydration.  CAUSES   Vomiting.  Diarrhea.  Excessive sweating.  Excessive urine output.  Fever. SYMPTOMS  Mild dehydration  Thirst.  Dry lips.  Slightly dry mouth. Moderate dehydration  Very dry mouth.  Sunken eyes.  Skin does not bounce back quickly when lightly pinched and released.  Dark urine and decreased urine production.  Decreased tear production.  Headache. Severe dehydration  Very dry mouth.  Extreme thirst.  Rapid, weak pulse (more than 100 beats per minute at rest).  Cold hands and feet.  Not able to sweat in spite of heat and temperature.  Rapid breathing.  Blue lips.  Confusion and lethargy.  Difficulty being awakened.  Minimal urine production.  No tears. DIAGNOSIS  Your  caregiver will diagnose dehydration based on your symptoms and your exam. Blood and urine tests will help confirm the diagnosis. The diagnostic evaluation should also identify the cause of dehydration. TREATMENT  Treatment of mild or moderate dehydration can often be done at home by increasing the amount of fluids that you drink. It is best to drink small amounts of fluid more often. Drinking too much at one time can make vomiting worse. Refer to the home care instructions below. Severe dehydration needs to be treated at the hospital where you will probably be given intravenous (IV) fluids that contain water and electrolytes. HOME CARE INSTRUCTIONS   Ask your caregiver about specific rehydration instructions.  Drink enough fluids to keep your urine clear or pale yellow.  Drink small amounts frequently if you have nausea and vomiting.  Eat as you normally do.  Avoid:  Foods or drinks high in sugar.  Carbonated drinks.  Juice.  Extremely hot or cold fluids.  Drinks with caffeine.  Fatty, greasy foods.  Alcohol.  Tobacco.  Overeating.  Gelatin desserts.  Wash your hands well to avoid spreading bacteria and viruses.  Only take over-the-counter or prescription medicines for pain, discomfort, or fever as directed by your caregiver.  Ask your caregiver if you should continue all prescribed and over-the-counter medicines.  Keep all follow-up appointments with your caregiver. SEEK MEDICAL CARE IF:  You have abdominal pain and it increases or stays in one area (localizes).  You have a rash, stiff neck, or severe headache.  You are irritable, sleepy, or difficult to awaken.  You are weak, dizzy, or extremely thirsty. SEEK IMMEDIATE MEDICAL CARE IF:   You are unable to keep fluids down or you get worse despite treatment.  You have frequent episodes of vomiting or diarrhea.  You have blood or green matter (bile) in your vomit.  You have blood in your stool or your stool  looks black and tarry.  You have not urinated in 6 to 8 hours, or you have only urinated a small amount of very dark urine.  You have a fever.  You faint. MAKE SURE YOU:   Understand these instructions.  Will watch your condition.  Will get help right away if you are not doing well or get worse. Document Released: 09/09/2005 Document Revised: 12/02/2011 Document Reviewed: 04/29/2011 [facility] Patient Information 2014 [place], [place].

## 2013-12-29 NOTE — ED Notes (Signed)
Communication with Pharmacy to send med 

## 2013-12-30 LAB — OTHER BODY FLUID CHEMISTRY

## 2013-12-31 ENCOUNTER — Encounter (HOSPITAL_COMMUNITY): Payer: Self-pay | Admitting: Internal Medicine

## 2013-12-31 ENCOUNTER — Emergency Department (HOSPITAL_COMMUNITY): Payer: Medicare Other

## 2013-12-31 ENCOUNTER — Inpatient Hospital Stay (HOSPITAL_COMMUNITY)
Admission: EM | Admit: 2013-12-31 | Discharge: 2014-01-06 | DRG: 177 | Disposition: A | Discharge status: Skilled Nursing Facility | Payer: Medicare Other | Attending: Internal Medicine | Admitting: Internal Medicine

## 2013-12-31 DIAGNOSIS — Z8719 Personal history of other diseases of the digestive system: Secondary | ICD-10-CM

## 2013-12-31 DIAGNOSIS — J449 Chronic obstructive pulmonary disease, unspecified: Secondary | ICD-10-CM

## 2013-12-31 DIAGNOSIS — N390 Urinary tract infection, site not specified: Secondary | ICD-10-CM

## 2013-12-31 DIAGNOSIS — E43 Unspecified severe protein-calorie malnutrition: Secondary | ICD-10-CM

## 2013-12-31 DIAGNOSIS — J4489 Other specified chronic obstructive pulmonary disease: Secondary | ICD-10-CM

## 2013-12-31 DIAGNOSIS — J690 Pneumonitis due to inhalation of food and vomit: Secondary | ICD-10-CM

## 2013-12-31 DIAGNOSIS — M129 Arthropathy, unspecified: Secondary | ICD-10-CM | POA: Diagnosis present

## 2013-12-31 DIAGNOSIS — R1310 Dysphagia, unspecified: Secondary | ICD-10-CM

## 2013-12-31 DIAGNOSIS — I739 Peripheral vascular disease, unspecified: Secondary | ICD-10-CM

## 2013-12-31 DIAGNOSIS — J90 Pleural effusion, not elsewhere classified: Secondary | ICD-10-CM

## 2013-12-31 DIAGNOSIS — E119 Type 2 diabetes mellitus without complications: Secondary | ICD-10-CM

## 2013-12-31 DIAGNOSIS — Z9119 Patient's noncompliance with other medical treatment and regimen: Secondary | ICD-10-CM

## 2013-12-31 DIAGNOSIS — D62 Acute posthemorrhagic anemia: Secondary | ICD-10-CM

## 2013-12-31 DIAGNOSIS — R4789 Other speech disturbances: Secondary | ICD-10-CM | POA: Diagnosis present

## 2013-12-31 DIAGNOSIS — K219 Gastro-esophageal reflux disease without esophagitis: Secondary | ICD-10-CM | POA: Diagnosis present

## 2013-12-31 DIAGNOSIS — I4891 Unspecified atrial fibrillation: Secondary | ICD-10-CM

## 2013-12-31 DIAGNOSIS — G459 Transient cerebral ischemic attack, unspecified: Secondary | ICD-10-CM

## 2013-12-31 DIAGNOSIS — IMO0002 Reserved for concepts with insufficient information to code with codable children: Secondary | ICD-10-CM

## 2013-12-31 DIAGNOSIS — E8809 Other disorders of plasma-protein metabolism, not elsewhere classified: Secondary | ICD-10-CM | POA: Diagnosis present

## 2013-12-31 DIAGNOSIS — E785 Hyperlipidemia, unspecified: Secondary | ICD-10-CM

## 2013-12-31 DIAGNOSIS — Z79899 Other long term (current) drug therapy: Secondary | ICD-10-CM

## 2013-12-31 DIAGNOSIS — Z91199 Patient's noncompliance with other medical treatment and regimen due to unspecified reason: Secondary | ICD-10-CM

## 2013-12-31 DIAGNOSIS — Z8673 Personal history of transient ischemic attack (TIA), and cerebral infarction without residual deficits: Secondary | ICD-10-CM

## 2013-12-31 DIAGNOSIS — Z7901 Long term (current) use of anticoagulants: Secondary | ICD-10-CM

## 2013-12-31 DIAGNOSIS — I10 Essential (primary) hypertension: Secondary | ICD-10-CM

## 2013-12-31 DIAGNOSIS — D649 Anemia, unspecified: Secondary | ICD-10-CM

## 2013-12-31 DIAGNOSIS — Z87891 Personal history of nicotine dependence: Secondary | ICD-10-CM

## 2013-12-31 DIAGNOSIS — J9819 Other pulmonary collapse: Secondary | ICD-10-CM | POA: Diagnosis present

## 2013-12-31 DIAGNOSIS — R5381 Other malaise: Secondary | ICD-10-CM | POA: Diagnosis present

## 2013-12-31 DIAGNOSIS — J189 Pneumonia, unspecified organism: Secondary | ICD-10-CM

## 2013-12-31 DIAGNOSIS — I517 Cardiomegaly: Secondary | ICD-10-CM

## 2013-12-31 DIAGNOSIS — I639 Cerebral infarction, unspecified: Secondary | ICD-10-CM

## 2013-12-31 LAB — URINE MICROSCOPIC-ADD ON

## 2013-12-31 LAB — COMPREHENSIVE METABOLIC PANEL
ALT: 18 U/L (ref 0–53)
AST: 54 U/L — ABNORMAL HIGH (ref 0–37)
Albumin: 2.9 g/dL — ABNORMAL LOW (ref 3.5–5.2)
Alkaline Phosphatase: 65 U/L (ref 39–117)
BUN: 40 mg/dL — AB (ref 6–23)
CALCIUM: 8.8 mg/dL (ref 8.4–10.5)
CO2: 24 mEq/L (ref 19–32)
CREATININE: 0.74 mg/dL (ref 0.50–1.35)
Chloride: 99 mEq/L (ref 96–112)
GFR calc non Af Amer: 83 mL/min — ABNORMAL LOW (ref >90)
GLUCOSE: 169 mg/dL — AB (ref 70–99)
POTASSIUM: 5.3 meq/L (ref 3.7–5.3)
Sodium: 134 mEq/L — ABNORMAL LOW (ref 137–147)
TOTAL PROTEIN: 6.2 g/dL (ref 6.0–8.3)
Total Bilirubin: 0.7 mg/dL (ref 0.3–1.2)

## 2013-12-31 LAB — URINALYSIS, ROUTINE W REFLEX MICROSCOPIC
BILIRUBIN URINE: NEGATIVE
Glucose, UA: NEGATIVE mg/dL
Hgb urine dipstick: NEGATIVE
Ketones, ur: NEGATIVE mg/dL
NITRITE: NEGATIVE
Protein, ur: NEGATIVE mg/dL
SPECIFIC GRAVITY, URINE: 1.024 (ref 1.005–1.030)
UROBILINOGEN UA: 1 mg/dL (ref 0.0–1.0)
pH: 5 (ref 5.0–8.0)

## 2013-12-31 LAB — CBC WITH DIFFERENTIAL/PLATELET
BASOS PCT: 0 % (ref 0–1)
Basophils Absolute: 0 10*3/uL (ref 0.0–0.1)
EOS ABS: 0 10*3/uL (ref 0.0–0.7)
EOS PCT: 0 % (ref 0–5)
HCT: 23 % — ABNORMAL LOW (ref 39.0–52.0)
HEMOGLOBIN: 8.4 g/dL — AB (ref 13.0–17.0)
LYMPHS ABS: 1.2 10*3/uL (ref 0.7–4.0)
Lymphocytes Relative: 9 % — ABNORMAL LOW (ref 12–46)
MCH: 34.6 pg — AB (ref 26.0–34.0)
MCHC: 36.5 g/dL — AB (ref 30.0–36.0)
MCV: 94.7 fL (ref 78.0–100.0)
MONO ABS: 3.1 10*3/uL — AB (ref 0.1–1.0)
MONOS PCT: 22 % — AB (ref 3–12)
NEUTROS PCT: 69 % (ref 43–77)
Neutro Abs: 9.6 10*3/uL — ABNORMAL HIGH (ref 1.7–7.7)
Platelets: 283 10*3/uL (ref 150–400)
RBC: 2.43 MIL/uL — ABNORMAL LOW (ref 4.22–5.81)
RDW: 12.9 % (ref 11.5–15.5)
WBC: 13.9 10*3/uL — ABNORMAL HIGH (ref 4.0–10.5)

## 2013-12-31 LAB — GLUCOSE, CAPILLARY: Glucose-Capillary: 165 mg/dL — ABNORMAL HIGH (ref 70–99)

## 2013-12-31 LAB — I-STAT CG4 LACTIC ACID, ED: LACTIC ACID, VENOUS: 1.23 mmol/L (ref 0.5–2.2)

## 2013-12-31 LAB — POC OCCULT BLOOD, ED: Fecal Occult Bld: POSITIVE — AB

## 2013-12-31 MED ORDER — PIPERACILLIN-TAZOBACTAM 3.375 G IVPB 30 MIN: 3.3750 g | Freq: Three times a day (TID) | INTRAVENOUS | Status: DC

## 2013-12-31 MED ORDER — VANCOMYCIN HCL 500 MG IV SOLR
500.0000 mg | Freq: Two times a day (BID) | INTRAVENOUS | Status: DC
  Administered 2014-01-01 – 2014-01-02 (×4): 500 mg via INTRAVENOUS
  Filled 2013-12-31 (×6): qty 500

## 2013-12-31 MED ORDER — IPRATROPIUM BROMIDE 0.02 % IN SOLN
0.5000 mg | Freq: Four times a day (QID) | RESPIRATORY_TRACT | Status: DC
  Administered 2013-12-31: 0.5 mg via RESPIRATORY_TRACT
  Filled 2013-12-31: qty 2.5

## 2013-12-31 MED ORDER — PANTOPRAZOLE SODIUM 40 MG PO TBEC
40.0000 mg | DELAYED_RELEASE_TABLET | Freq: Once | ORAL | Status: AC
  Administered 2013-12-31: 40 mg via ORAL
  Filled 2013-12-31: qty 1

## 2013-12-31 MED ORDER — SIMVASTATIN 40 MG PO TABS
40.0000 mg | ORAL_TABLET | Freq: Every evening | ORAL | Status: DC
  Administered 2014-01-01 – 2014-01-05 (×5): 40 mg via ORAL
  Filled 2013-12-31 (×6): qty 1

## 2013-12-31 MED ORDER — BUDESONIDE 0.5 MG/2ML IN SUSP: 0.5000 mg | Freq: Two times a day (BID) | RESPIRATORY_TRACT | Status: DC | PRN

## 2013-12-31 MED ORDER — VANCOMYCIN HCL IN DEXTROSE 1-5 GM/200ML-% IV SOLN
1000.0000 mg | Freq: Once | INTRAVENOUS | Status: AC
  Administered 2013-12-31: 1000 mg via INTRAVENOUS
  Filled 2013-12-31: qty 200

## 2013-12-31 MED ORDER — CARVEDILOL 6.25 MG PO TABS
6.2500 mg | ORAL_TABLET | Freq: Two times a day (BID) | ORAL | Status: DC
  Administered 2014-01-01 – 2014-01-06 (×11): 6.25 mg via ORAL
  Filled 2013-12-31 (×14): qty 1

## 2013-12-31 MED ORDER — METOPROLOL TARTRATE 1 MG/ML IV SOLN: 2.5000 mg | Freq: Once | INTRAVENOUS | Status: DC

## 2013-12-31 MED ORDER — PIPERACILLIN-TAZOBACTAM 3.375 G IVPB 30 MIN
3.3750 g | Freq: Once | INTRAVENOUS | Status: AC
  Administered 2013-12-31: 3.375 g via INTRAVENOUS
  Filled 2013-12-31: qty 50

## 2013-12-31 MED ORDER — SODIUM CHLORIDE 0.9 % IV SOLN
INTRAVENOUS | Status: AC
  Administered 2013-12-31 – 2014-01-01 (×2): via INTRAVENOUS

## 2013-12-31 MED ORDER — FAMOTIDINE 10 MG PO TABS
10.0000 mg | ORAL_TABLET | Freq: Every day | ORAL | Status: DC
  Administered 2014-01-02 – 2014-01-06 (×5): 10 mg via ORAL
  Filled 2013-12-31 (×7): qty 1

## 2013-12-31 MED ORDER — ALBUTEROL SULFATE (2.5 MG/3ML) 0.083% IN NEBU: 2.5000 mg | INHALATION_SOLUTION | Freq: Four times a day (QID) | RESPIRATORY_TRACT | Status: DC | PRN

## 2013-12-31 MED ORDER — INSULIN ASPART 100 UNIT/ML ~~LOC~~ SOLN
0.0000 [IU] | SUBCUTANEOUS | Status: DC
  Administered 2014-01-01: 1 [IU] via SUBCUTANEOUS
  Administered 2014-01-01 (×2): 2 [IU] via SUBCUTANEOUS
  Administered 2014-01-01 (×2): 1 [IU] via SUBCUTANEOUS
  Administered 2014-01-02: 3 [IU] via SUBCUTANEOUS
  Administered 2014-01-02: 2 [IU] via SUBCUTANEOUS
  Administered 2014-01-02 (×2): 1 [IU] via SUBCUTANEOUS
  Administered 2014-01-02: 3 [IU] via SUBCUTANEOUS
  Administered 2014-01-02: 2 [IU] via SUBCUTANEOUS

## 2013-12-31 MED ORDER — MORPHINE SULFATE 2 MG/ML IJ SOLN
2.0000 mg | INTRAMUSCULAR | Status: DC | PRN
  Administered 2013-12-31 – 2014-01-01 (×2): 2 mg via INTRAVENOUS
  Filled 2013-12-31 (×2): qty 1

## 2013-12-31 MED ORDER — CILOSTAZOL 50 MG PO TABS
50.0000 mg | ORAL_TABLET | Freq: Two times a day (BID) | ORAL | Status: DC
  Administered 2014-01-01 – 2014-01-06 (×10): 50 mg via ORAL
  Filled 2013-12-31 (×13): qty 1

## 2013-12-31 MED ORDER — LORATADINE 10 MG PO TABS
10.0000 mg | ORAL_TABLET | Freq: Every day | ORAL | Status: DC
  Administered 2014-01-02 – 2014-01-06 (×5): 10 mg via ORAL
  Filled 2013-12-31 (×6): qty 1

## 2013-12-31 MED ORDER — PIPERACILLIN-TAZOBACTAM 3.375 G IVPB
3.3750 g | Freq: Three times a day (TID) | INTRAVENOUS | Status: DC
  Administered 2014-01-01 – 2014-01-05 (×15): 3.375 g via INTRAVENOUS
  Filled 2013-12-31 (×17): qty 50

## 2013-12-31 MED ORDER — IPRATROPIUM-ALBUTEROL 0.5-2.5 (3) MG/3ML IN SOLN
3.0000 mL | Freq: Once | RESPIRATORY_TRACT | Status: AC
  Administered 2013-12-31: 3 mL via RESPIRATORY_TRACT
  Filled 2013-12-31: qty 3

## 2013-12-31 NOTE — ED Notes (Signed)
Pt. Went x-ray. Pt. Aware of giving a urine specimen. Pt. Could not urinate at this time. Nurse was notified.

## 2013-12-31 NOTE — ED Notes (Signed)
Pt BIB EMS. Pt is from home. Pt c/o productive cough and pneumonia for the past 3 days. Pt just d/c'ed from hosp for same yesterday. Pt states he is not feeling better. Pt has productive cough with brownish/yellow sputum. Pt has diminished breath sounds per EMS. Pt was 88% on RA per EMS. Pt placed on 4L and sats 97%. Pt a/o x 4. Pt has weakness on L side and limited use of L arm from prior stroke.

## 2013-12-31 NOTE — Progress Notes (Signed)
ANTIBIOTIC CONSULT NOTE - INITIAL  Pharmacy Consult for Vancomycin Indication: pneumonia  No Known Allergies  Patient Measurements: Height: 5\' 4"  (162.6 cm) Weight: 142 lb 3.2 oz (64.5 kg) IBW/kg (Calculated) : 59.2  Vital Signs: Temp: 98.1 F (36.7 C) (04/10 2202) Temp src: Oral (04/10 2202) BP: 113/77 mmHg (04/10 2202) Pulse Rate: 105 (04/10 2202) Intake/Output from previous day:   Intake/Output from this shift: Total I/O In: 0  Out: 200 [Urine:200]  Labs:  Recent Labs  12/29/13 0953 12/31/13 1835  WBC  --  13.9*  HGB 11.2* 8.4*  PLT  --  283  CREATININE 0.90 0.74   Estimated Creatinine Clearance: 58.6 ml/min (by C-G formula based on Cr of 0.74). No results found for this basename: VANCOTROUGH, Corlis Leak, VANCORANDOM, [place], GENTPEAK, GENTRANDOM, TOBRATROUGH, TOBRAPEAK, TOBRARND, AMIKACINPEAK, AMIKACINTROU, AMIKACIN,  in the last 72 hours   Microbiology: Recent Results (from the past 720 hour(s))  BODY FLUID CULTURE     Status: None   Collection Time    12/24/13 11:00 AM      Result Value Ref Range Status   Specimen Description PLEURAL FLUID LEFT   Final   Special Requests 60ML FLUID   Final   Gram Stain     Final   Value: WBC PRESENT,BOTH PMN AND MONONUCLEAR     NO ORGANISMS SEEN     Performed at [facility]   Culture     Final   Value: NO GROWTH 3 DAYS     Performed at [facility]   Report Status 12/28/2013 FINAL   Final    Medical History: Past Medical History  Diagnosis Date  . Diabetes mellitus without complication   . Dysrhythmia     atrial fibrilation  . Hypertension   . Shortness of breath   . Peripheral vascular disease   . Claudication of calf muscles 11/05/2012    right calf  . GERD (gastroesophageal reflux disease)   . Arthritis   . TIA (transient ischemic attack) 2010    Medications:  Scheduled:  . sodium chloride   Intravenous STAT  . carvedilol  6.25 mg Oral BID WC  . cilostazol  50 mg Oral BID  .  famotidine  10 mg Oral Daily  . [START ON 01/01/2014] insulin aspart  0-9 Units Subcutaneous 6 times per day  . ipratropium  0.5 mg Nebulization QID  . [START ON 01/01/2014] loratadine  10 mg Oral Daily  . metoprolol  2.5 mg Intravenous Once  . [START ON 01/01/2014] piperacillin-tazobactam (ZOSYN)  IV  3.375 g Intravenous Q8H  . simvastatin  40 mg Oral QPM   Infusions:   Assessment:  80 yr male with h/o being treated by PCP for pneumonia with recent hospitalization.  Pt in ED with complaints of fever, SOB and a productive cough  Received Vancomycin 1gm IV x 1 @ 20:57 in ED and Zosyn 3.375gm IV x 1 in ED  Upon admission, Zosyn 3.375gm IV q8h and Vancomycin per pharmacy ordered for suspected aspiration pneumonia.  RX asked to adjust antibiotic dosages based on renal function, as necessary  Blood and sputum cultures ordered  Goal of Therapy:  Vancomycin trough level 15-20 mcg/ml  Plan:  Measure antibiotic drug levels at steady state Follow up culture results Continue Zosyn as ordered per MD Vancomycin 500mg  IV q12h  Everette Rank, PharmD 12/31/2013,11:25 PM

## 2013-12-31 NOTE — ED Notes (Addendum)
Thursday morning last week pt started having cramping and pain in right leg. Pt normally able to walk with a walker, hasn't been able to stand or walk since. Pt seen at hospital, diagnosed with muscle spasm, and sent home with muscle relaxer. Pt states muscle relaxer not helping.

## 2013-12-31 NOTE — ED Provider Notes (Addendum)
CSN: [id number]     Arrival date & time 12/31/13  1746 History   First MD Initiated Contact with Patient 12/31/13 1811     Chief Complaint  Patient presents with  . Pneumonia     (Consider location/radiation/quality/duration/timing/severity/associated sxs/prior Treatment) The history is provided by the patient.  DUPREE GIVLER is a 80 y.o. male hx of DM, HTN recent pneumonia here with cough, fever. Patient states persistent cough since he had pneumonia about a month ago. He has worsening cough or the last several days. He is coughing up some yellowish sputum. He had some pleural effusion that was status post IR drainage several days ago. He also was noted to be hypoxic 88% as per EMS. He is on O2 at home as per patient.    Level V caveat- condition of patient    Past Medical History  Diagnosis Date  . Diabetes mellitus without complication   . Dysrhythmia     atrial fibrilation  . Hypertension   . Shortness of breath   . Peripheral vascular disease   . Claudication of calf muscles 11/05/2012    right calf  . GERD (gastroesophageal reflux disease)   . Arthritis   . TIA (transient ischemic attack) 2010   Past Surgical History  Procedure Laterality Date  . Foot surgery      foot surgery due to broken foot years ago  . Esophagogastroduodenoscopy N/A 11/02/2013    Procedure: ESOPHAGOGASTRODUODENOSCOPY (EGD);  Surgeon: Cleotis Nipper, MD;  Location: [facility] ENDOSCOPY;  Service: Endoscopy;  Laterality: N/A;   No family history on file. History  Substance Use Topics  . Smoking status: Former Smoker -- 3.00 packs/day for 35 years    Types: Cigarettes    Quit date: 09/23/1981  . Smokeless tobacco: Never Used  . Alcohol Use: No     Comment: QUITS YEARS AGO    Review of Systems  Respiratory: Positive for cough and shortness of breath.   All other systems reviewed and are negative.     Allergies  Review of patient's allergies indicates no known allergies.  Home  Medications   Current Outpatient Rx  Name  Route  Sig  Dispense  Refill  . acetaminophen (TYLENOL) 500 MG tablet   Oral   Take 1,000 mg by mouth every 6 (six) hours as needed for mild pain.         Marland Kitchen albuterol (PROVENTIL HFA;VENTOLIN HFA) 108 (90 BASE) MCG/ACT inhaler   Inhalation   Inhale 2 puffs into the lungs every 6 (six) hours as needed for wheezing or shortness of breath.   1 Inhaler   2   . amLODipine (NORVASC) 5 MG tablet   Oral   Take 5 mg by mouth daily.         . budesonide (PULMICORT) 0.5 MG/2ML nebulizer solution   Nebulization   Take 0.5 mg by nebulization 2 (two) times daily as needed (for wheezing/shortness of breath).          . carvedilol (COREG) 6.25 MG tablet   Oral   Take 6.25 mg by mouth 2 (two) times daily with a meal.         . cilostazol (PLETAL) 50 MG tablet   Oral   Take 50 mg by mouth 2 (two) times daily.         Marland Kitchen loratadine (CLARITIN) 10 MG tablet   Oral   Take 10 mg by mouth daily.         . magnesium  oxide (MAG-OX) 400 MG tablet   Oral   Take 200 mg by mouth daily.         . Melatonin 3 MG TABS   Oral   Take 3 mg by mouth at bedtime.         . metFORMIN (GLUCOPHAGE) 1000 MG tablet   Oral   Take 500 mg by mouth 2 (two) times daily with a meal.         . methocarbamol (ROBAXIN) 500 MG tablet   Oral   Take 1 tablet (500 mg total) by mouth every 8 (eight) hours as needed for muscle spasms (or pain).   20 tablet   0   . Multiple Vitamins-Minerals (PRESERVISION AREDS PO)   Oral   Take 2 capsules by mouth daily.         . ranitidine (ZANTAC) 300 MG tablet   Oral   Take 300 mg by mouth at bedtime.         . Rivaroxaban (XARELTO) 20 MG TABS tablet   Oral   Take 20 mg by mouth daily with supper.         . simvastatin (ZOCOR) 40 MG tablet   Oral   Take 40 mg by mouth every evening.         . tiotropium (SPIRIVA) 18 MCG inhalation capsule   Inhalation   Place 18 mcg into inhaler and inhale daily.          . vitamin B-12 (CYANOCOBALAMIN) 1000 MCG tablet   Oral   Take 1,000 mcg by mouth daily.          BP 123/60  Pulse 113  Temp(Src) 99.9 F (37.7 C) (Rectal)  Resp 18  Ht 5\' 4"  (1.626 m)  Wt 144 lb (65.318 kg)  BMI 24.71 kg/m2  SpO2 99% Physical Exam  Nursing note and vitals reviewed. Constitutional:  Chronically ill   HENT:  Head: Normocephalic.  Mouth/Throat: Oropharynx is clear and moist.  Eyes: Conjunctivae are normal. Pupils are equal, round, and reactive to light.  Neck: Normal range of motion. Neck supple.  Cardiovascular: Regular rhythm and normal heart sounds.   + mildly tachy   Pulmonary/Chest:  Tachypneic, + diffuse wheezing, diminished L base   Abdominal: Soft. Bowel sounds are normal. He exhibits no distension. There is no tenderness. There is no rebound and no guarding.  Musculoskeletal: Normal range of motion.  Neurological: He is alert.  Tired, slightly confused. Not moving L side as much (chronic after last stroke)   Skin: Skin is warm and dry.  Psychiatric:  Unable     ED Course  Procedures (including critical care time) Labs Review Labs Reviewed  CBC WITH DIFFERENTIAL - Abnormal; Notable for the following:    WBC 13.9 (*)    RBC 2.43 (*)    Hemoglobin 8.4 (*)    HCT 23.0 (*)    MCH 34.6 (*)    MCHC 36.5 (*)    Neutro Abs 9.6 (*)    Lymphocytes Relative 9 (*)    Monocytes Relative 22 (*)    Monocytes Absolute 3.1 (*)    All other components within normal limits  COMPREHENSIVE METABOLIC PANEL - Abnormal; Notable for the following:    Sodium 134 (*)    Glucose, Bld 169 (*)    BUN 40 (*)    Albumin 2.9 (*)    AST 54 (*)    GFR calc non Af Amer 83 (*)    All other components  within normal limits  URINALYSIS, ROUTINE W REFLEX MICROSCOPIC - Abnormal; Notable for the following:    Color, Urine AMBER (*)    Leukocytes, UA SMALL (*)    All other components within normal limits  URINE MICROSCOPIC-ADD ON - Abnormal; Notable for the  following:    Casts GRANULAR CAST (*)    All other components within normal limits  CULTURE, BLOOD (ROUTINE X 2)  CULTURE, BLOOD (ROUTINE X 2)  I-STAT CG4 LACTIC ACID, ED  POC OCCULT BLOOD, ED   Imaging Review Dg Chest 2 View  12/31/2013   CLINICAL DATA:  Pneumonia  EXAM: CHEST  2 VIEW  COMPARISON:  CT CHEST W/O CM dated 12/24/2013; DG CHEST 1V dated 12/14/2013  FINDINGS: Mild cardiac enlargement. Aortic calcification. Calcified granuloma right lower lobe. Right lung otherwise clear except for mild scarring or atelectasis at the base.  On the left there is a moderate effusion with underlying lower lobe consolidation. This appears similar to CT scan performed 12/24/2013.  IMPRESSION: No significant change from recent CT scan with moderate left effusion and associated consolidated left lung.   Electronically Signed   By: Skipper Cliche M.D.   On: 12/31/2013 18:58     EKG Interpretation None      MDM   Final diagnoses:  None   ESLI JERNIGAN is a 80 y.o. male here with fever, cough. Concerned for HCAP. Will do sepsis workup. Will likely need admission given hypoxia.   8:11 PM cxr showed L pleural effusion with consolidation. WBC 13. Given recent admission for CAP, will give vanc/zosyn for HCAP. Will admit. Also Hg 8.4, lower than before. Wife said he has brown stool, no melena. Occ positive. Likely early GI bleed. Since Hg > 7, will hold off transfusion for now.    Wandra Arthurs, MD 12/31/13 2012  Wandra Arthurs, MD 12/31/13 2031

## 2013-12-31 NOTE — Progress Notes (Signed)
   CARE MANAGEMENT ED NOTE 12/31/2013  Patient:  [name], [name]   Account Number:  [id number]  Date Initiated:  12/31/2013  Documentation initiated by:  Livia Snellen  Subjective/Objective Assessment:   Patient presents to Ed with productive cough for brownish yellow sputum.     Subjective/Objective Assessment Detail:   Patient's pulse ox per EMS 88% on room air.     Action/Plan:   Action/Plan Detail:   Anticipated DC Date:       Status Recommendation to Physician:   Result of Recommendation:    Other ED Services  Consult Working [place]  Other    Choice offered to / List presented to:            Status of service:  Completed, signed off  ED Comments:   ED Comments Detail:  EDCM spoke to patient's wife Mable at bedside.  Mable's phone number [phone].  Mable reports she and the patient are not legally married.  Mable reports been together with the patient for over 30 years, "coomon law." Patient has a walker, wheelchair, and hospital bed at home. Patient received hospital bed at home from [facility].  EDCM noted patient wearing oxygen in the ED.  Patient does not wear oxygen at home.  Patient's wife reports the patient is supposed to be starting home health services with [place], PT, OT.  Patient's wife reports that [facility] has not contacted her yet for services.  EDCM provided patient's wife with list of home health agencies in [facility], list of private duty nursing services and printed information on the [employer] of [place].  EDCM explained to patient's wife that private duty nursing services would be an out of pocket expense.  Patient's wife thankful for resources.  No further EDCM needs at this time.

## 2013-12-31 NOTE — ED Notes (Signed)
Pt out of room for chest x-ray

## 2013-12-31 NOTE — H&P (Addendum)
PCP: [name], MD    Chief Complaint:  hypoxia  HPI: [name] is a 80 y.o. male   has a past medical history of Diabetes mellitus without complication; Dysrhythmia; Hypertension; Shortness of breath; Peripheral vascular disease; Claudication of calf muscles (11/05/2012); GERD (gastroesophageal reflux disease); Arthritis; and TIA (transient ischemic attack) (2010).   Presented with  Patient have had recurrent episodes of PNA treated as out patient by his PCP he also have bee hospitalized for this in the recent past. Patient was noted to have pleural effusion that was tapped on 4/3/1 showing reactive mesothelial cells.  Family states that he woke up last with sever leg pain he was sent to [facility] ER and was set home with muscle spasm. Family states he could not walk since then. Today he started to have shortness of breath and presented to ER he was found to have likely PNA.  He had a low grade fever up to 99.9 today. CT of the chest was done 3/31 and showed high-density particular matter within the collapsed left lower lobe, suggesting chronic aspiration. He had swallow study done showing evidence of aspiration per family and was supposed to be on thickened liquids but it was too expancive and family did not purchase it. Have not had a bowel bowl movement in the past 3 days. Family not sure if he had any blood in his stoll but his CBC was down 8.4 from 11.2 2 days ago. Per review of records he have had a positive blood stools for quite some time. He was evaluated with endoscopy in February he 2015 but did not show any source of bleeding. At that time Xarelto for his atrial fibrillation was resumed.    Review of Systems:   Pertinent positives include:  Fevers, shortness of breath at rest.  productive cough,  Constitutional:  No weight loss, night sweats,chills, fatigue, weight loss  HEENT:  No headaches, Difficulty swallowing,Tooth/dental problems,Sore throat,  No sneezing, itching, ear  ache, nasal congestion, post nasal drip,  Cardio-vascular:  No chest pain, Orthopnea, PND, anasarca, dizziness, palpitations.no Bilateral lower extremity swelling  GI:  No heartburn, indigestion, abdominal pain, nausea, vomiting, diarrhea, change in bowel habits, loss of appetite, melena, blood in stool, hematemesis Resp:  no  No dyspnea on exertion, No excess mucus, no No non-productive cough, No coughing up of blood.No change in color of mucus.No wheezing. Skin:  no rash or lesions. No jaundice GU:  no dysuria, change in color of urine, no urgency or frequency. No straining to urinate.  No flank pain.  Musculoskeletal:  No joint pain or no joint swelling. No decreased range of motion. No back pain.  Psych:  No change in mood or affect. No depression or anxiety. No memory loss.  Neuro: no localizing neurological complaints, no tingling, no weakness, no double vision, no gait abnormality, no slurred speech, no confusion  Otherwise ROS are negative except for above, 10 systems were reviewed  Past Medical History: Past Medical History  Diagnosis Date  . Diabetes mellitus without complication   . Dysrhythmia     atrial fibrilation  . Hypertension   . Shortness of breath   . Peripheral vascular disease   . Claudication of calf muscles 11/05/2012    right calf  . GERD (gastroesophageal reflux disease)   . Arthritis   . TIA (transient ischemic attack) 2010   Past Surgical History  Procedure Laterality Date  . Foot surgery      foot surgery due to  broken foot years ago  . Esophagogastroduodenoscopy N/A 11/02/2013    Procedure: ESOPHAGOGASTRODUODENOSCOPY (EGD);  Surgeon: Cleotis Nipper, MD;  Location: [facility] ENDOSCOPY;  Service: Endoscopy;  Laterality: N/A;     Medications: Prior to Admission medications   Medication Sig Start Date End Date Taking? Authorizing Provider  acetaminophen (TYLENOL) 500 MG tablet Take 1,000 mg by mouth every 6 (six) hours as needed for mild pain.   Yes  Historical Provider, MD  albuterol (PROVENTIL HFA;VENTOLIN HFA) 108 (90 BASE) MCG/ACT inhaler Inhale 2 puffs into the lungs every 6 (six) hours as needed for wheezing or shortness of breath. 11/03/13  Yes Ripudeep Krystal Eaton, MD  amLODipine (NORVASC) 5 MG tablet Take 5 mg by mouth daily.   Yes Historical Provider, MD  budesonide (PULMICORT) 0.5 MG/2ML nebulizer solution Take 0.5 mg by nebulization 2 (two) times daily as needed (for wheezing/shortness of breath).    Yes Historical Provider, MD  carvedilol (COREG) 6.25 MG tablet Take 6.25 mg by mouth 2 (two) times daily with a meal.   Yes Historical Provider, MD  cilostazol (PLETAL) 50 MG tablet Take 50 mg by mouth 2 (two) times daily.   Yes Historical Provider, MD  loratadine (CLARITIN) 10 MG tablet Take 10 mg by mouth daily.   Yes Historical Provider, MD  magnesium oxide (MAG-OX) 400 MG tablet Take 200 mg by mouth daily.   Yes Historical Provider, MD  Melatonin 3 MG TABS Take 3 mg by mouth at bedtime.   Yes Historical Provider, MD  metFORMIN (GLUCOPHAGE) 1000 MG tablet Take 500 mg by mouth 2 (two) times daily with a meal.   Yes Historical Provider, MD  methocarbamol (ROBAXIN) 500 MG tablet Take 1 tablet (500 mg total) by mouth every 8 (eight) hours as needed for muscle spasms (or pain). 12/29/13  Yes Clayton Bibles, PA-C  Multiple Vitamins-Minerals (PRESERVISION AREDS PO) Take 2 capsules by mouth daily.   Yes Historical Provider, MD  ranitidine (ZANTAC) 300 MG tablet Take 300 mg by mouth at bedtime.   Yes Historical Provider, MD  Rivaroxaban (XARELTO) 20 MG TABS tablet Take 20 mg by mouth daily with supper.   Yes Historical Provider, MD  simvastatin (ZOCOR) 40 MG tablet Take 40 mg by mouth every evening.   Yes Historical Provider, MD  tiotropium (SPIRIVA) 18 MCG inhalation capsule Place 18 mcg into inhaler and inhale daily.   Yes Historical Provider, MD  vitamin B-12 (CYANOCOBALAMIN) 1000 MCG tablet Take 1,000 mcg by mouth daily.   Yes Historical Provider, MD     Allergies:  No Known Allergies  Social History:  Ambulatory  walker   Lives at   Home with family   reports that he quit smoking about 32 years ago. His smoking use included Cigarettes. He has a 105 pack-year smoking history. He has never used smokeless tobacco. He reports that he does not drink alcohol or use illicit drugs.   Family History: family history is negative for Cancer and CAD.    Physical Exam: Patient Vitals for the past 24 hrs:  BP Temp Temp src Pulse Resp SpO2 Height Weight  12/31/13 1942 - 99.9 F (37.7 C) Rectal - - - - -  12/31/13 1926 - - - 113 18 99 % - -  12/31/13 1814 - - - - - 93 % - -  12/31/13 1813 - - - - - 91 % - -  12/31/13 1810 123/60 mmHg - - 103 18 91 % 5\' 4"  (1.626 m) 65.318 kg (144  lb)  12/31/13 1808 - - - - - 97 % - -    1. General:  in No Acute distress 2. Psychological: Alert and   Oriented 3. Head/ENT:   Moist  Mucous Membranes                          Head Non traumatic, neck supple                           Poor Dentition 4. SKIN:   decreased Skin turgor,  Skin clean Dry and intact no rash 5. Heart: Regular rate and rhythm no Murmur, Rub or gallop 6. Lungs: no wheezes or crackles  coarse breath sounds 7. Abdomen: Soft, non-tender, Non distended and 8. Lower extremities: no clubbing, cyanosis, or edema 9. Neurologically Grossly intact, moving all 4 extremities equally 10. MSK: Normal range of motion  body mass index is 24.71 kg/(m^2).   Labs on Admission:   Recent Labs  12/29/13 0953 12/31/13 1835  NA 139 134*  K 4.5 5.3  CL 101 99  CO2  --  24  GLUCOSE 173* 169*  BUN 26* 40*  CREATININE 0.90 0.74  CALCIUM  --  8.8    Recent Labs  12/31/13 1835  AST 54*  ALT 18  ALKPHOS 65  BILITOT 0.7  PROT 6.2  ALBUMIN 2.9*   No results found for this basename: LIPASE, AMYLASE,  in the last 72 hours  Recent Labs  12/29/13 0953 12/31/13 1835  WBC  --  13.9*  NEUTROABS  --  9.6*  HGB 11.2* 8.4*  HCT 33.0* 23.0*   MCV  --  94.7  PLT  --  283   No results found for this basename: CKTOTAL, CKMB, CKMBINDEX, TROPONINI,  in the last 72 hours No results found for this basename: TSH, T4TOTAL, FREET3, T3FREE, THYROIDAB,  in the last 72 hours No results found for this basename: VITAMINB12, FOLATE, FERRITIN, TIBC, IRON, RETICCTPCT,  in the last 72 hours Lab Results  Component Value Date   HGBA1C 5.6 07/06/2013    Estimated Creatinine Clearance: 58.6 ml/min (by C-G formula based on Cr of 0.74). ABG    Component Value Date/Time   TCO2 25 12/29/2013 0953     No results found for this basename: DDIMER     Other results:    UA 7-10 bacteria -few    Cultures:    Component Value Date/Time   SDES PLEURAL FLUID LEFT 12/24/2013 1100   SPECREQUEST 60ML FLUID 12/24/2013 1100   CULT  Value: NO GROWTH 3 DAYS Performed at [facility] 12/24/2013 1100   REPTSTATUS 12/28/2013 FINAL 12/24/2013 1100       Radiological Exams on Admission: Dg Chest 2 View  12/31/2013   CLINICAL DATA:  Pneumonia  EXAM: CHEST  2 VIEW  COMPARISON:  CT CHEST W/O CM dated 12/24/2013; DG CHEST 1V dated 12/14/2013  FINDINGS: Mild cardiac enlargement. Aortic calcification. Calcified granuloma right lower lobe. Right lung otherwise clear except for mild scarring or atelectasis at the base.  On the left there is a moderate effusion with underlying lower lobe consolidation. This appears similar to CT scan performed 12/24/2013.  IMPRESSION: No significant change from recent CT scan with moderate left effusion and associated consolidated left lung.   Electronically Signed   By: Skipper Cliche M.D.   On: 12/31/2013 18:58    Chart has been reviewed  Assessment/Plan  80 year old gentleman likely with aspiration pneumonia due to chronic aspiration due to medical noncompliance and dysphasia with  history of CVA  Present on Admission:  . Aspiration pneumonia - will cover with Zosyn and vancomycin. Obtain swallow evaluation  . Atrial fibrillation  - hold several code given possible GI bleed  . COPD (chronic obstructive pulmonary disease) - continue nebulizer treatment, he does not to be appear to be in acute exacerbation . Diabetes mellitus, type 2 - sliding scale hold metformin  . HTN (hypertension) given somewhat soft blood pressure hold amlodipine  . Pleural effusion, left - spoke to pulmonology who will see him in the morning give and recurrent pulmonary effusions. Of note patient has low albumin which could contribute to recurrent pulmonary effusion. Family states that he have had history of alcohol abuse in the remote past but not recently.  Hypoalbuminemia  - possibly secondary to malnutrition versus liver disease. We'll obtain ultrasound and prealbumin  . UTI (lower urinary tract infection) - ration of a urinary tract infection and should be covered by Zosyn await results of urine culture   anemia possibly due to blood loss. We'll hold xarelto recommend GI consult in a.m.  Right leg pain - etiology unclear given hx of PVD will obtain ABI, DVT less likely, unilateral neuropathy also less likely.    Prophylaxis: SCD, Protonix  CODE STATUS: FULL CODE  Other plan as per orders.  I have spent a total of 65 min on this admission - extra time was taken to discuss care with pulmonology  Lear Carstens 12/31/2013, 8:39 PM

## 2014-01-01 ENCOUNTER — Inpatient Hospital Stay (HOSPITAL_COMMUNITY): Payer: Medicare Other

## 2014-01-01 DIAGNOSIS — J690 Pneumonitis due to inhalation of food and vomit: Principal | ICD-10-CM

## 2014-01-01 DIAGNOSIS — J189 Pneumonia, unspecified organism: Secondary | ICD-10-CM

## 2014-01-01 DIAGNOSIS — D62 Acute posthemorrhagic anemia: Secondary | ICD-10-CM

## 2014-01-01 DIAGNOSIS — M79609 Pain in unspecified limb: Secondary | ICD-10-CM

## 2014-01-01 DIAGNOSIS — I4891 Unspecified atrial fibrillation: Secondary | ICD-10-CM

## 2014-01-01 DIAGNOSIS — D649 Anemia, unspecified: Secondary | ICD-10-CM

## 2014-01-01 LAB — IRON AND TIBC
Iron: 24 ug/dL — ABNORMAL LOW (ref 42–135)
Saturation Ratios: 12 % — ABNORMAL LOW (ref 20–55)
TIBC: 205 ug/dL — AB (ref 215–435)
UIBC: 181 ug/dL (ref 125–400)

## 2014-01-01 LAB — GLUCOSE, CAPILLARY
GLUCOSE-CAPILLARY: 136 mg/dL — AB (ref 70–99)
GLUCOSE-CAPILLARY: 123 mg/dL — AB (ref 70–99)
Glucose-Capillary: 109 mg/dL — ABNORMAL HIGH (ref 70–99)
Glucose-Capillary: 137 mg/dL — ABNORMAL HIGH (ref 70–99)
Glucose-Capillary: 143 mg/dL — ABNORMAL HIGH (ref 70–99)
Glucose-Capillary: 163 mg/dL — ABNORMAL HIGH (ref 70–99)
Glucose-Capillary: 164 mg/dL — ABNORMAL HIGH (ref 70–99)

## 2014-01-01 LAB — COMPREHENSIVE METABOLIC PANEL
ALBUMIN: 2.8 g/dL — AB (ref 3.5–5.2)
ALK PHOS: 76 U/L (ref 39–117)
ALT: 16 U/L (ref 0–53)
AST: 29 U/L (ref 0–37)
BILIRUBIN TOTAL: 0.9 mg/dL (ref 0.3–1.2)
BUN: 36 mg/dL — AB (ref 6–23)
CHLORIDE: 100 meq/L (ref 96–112)
CO2: 27 meq/L (ref 19–32)
Calcium: 9 mg/dL (ref 8.4–10.5)
Creatinine, Ser: 0.83 mg/dL (ref 0.50–1.35)
GFR calc Af Amer: >90 mL/min (ref >90)
GFR calc non Af Amer: 79 mL/min — ABNORMAL LOW (ref >90)
Glucose, Bld: 157 mg/dL — ABNORMAL HIGH (ref 70–99)
POTASSIUM: 4.3 meq/L (ref 3.7–5.3)
Sodium: 137 mEq/L (ref 137–147)
Total Protein: 6.1 g/dL (ref 6.0–8.3)

## 2014-01-01 LAB — TYPE AND SCREEN
ABO/RH(D): O POS
Antibody Screen: NEGATIVE

## 2014-01-01 LAB — ABO/RH: ABO/RH(D): O POS

## 2014-01-01 LAB — STREP PNEUMONIAE URINARY ANTIGEN: Strep Pneumo Urinary Antigen: NEGATIVE

## 2014-01-01 LAB — VITAMIN B12: Vitamin B-12: 1791 pg/mL — ABNORMAL HIGH (ref 211–911)

## 2014-01-01 LAB — HEMOGLOBIN A1C
HEMOGLOBIN A1C: 6.1 % — AB (ref <5.7)
MEAN PLASMA GLUCOSE: 128 mg/dL — AB (ref <117)

## 2014-01-01 LAB — RETICULOCYTES
RBC.: 2.41 MIL/uL — ABNORMAL LOW (ref 4.22–5.81)
Retic Count, Absolute: 53 10*3/uL (ref 19.0–186.0)
Retic Ct Pct: 2.2 % (ref 0.4–3.1)

## 2014-01-01 LAB — MAGNESIUM: MAGNESIUM: 1.7 mg/dL (ref 1.5–2.5)

## 2014-01-01 LAB — FOLATE: Folate: 15.3 ng/mL

## 2014-01-01 LAB — FERRITIN: FERRITIN: 324 ng/mL — AB (ref 22–322)

## 2014-01-01 LAB — PREALBUMIN: Prealbumin: 7 mg/dL — ABNORMAL LOW (ref 17.0–34.0)

## 2014-01-01 MED ORDER — TIOTROPIUM BROMIDE MONOHYDRATE 18 MCG IN CAPS
18.0000 ug | ORAL_CAPSULE | Freq: Every day | RESPIRATORY_TRACT | Status: DC
  Administered 2014-01-01 – 2014-01-06 (×6): 18 ug via RESPIRATORY_TRACT
  Filled 2014-01-01 (×2): qty 5

## 2014-01-01 MED ORDER — RESOURCE THICKENUP CLEAR PO POWD
ORAL | Status: DC | PRN
  Filled 2014-01-01: qty 125

## 2014-01-01 MED ORDER — KCL IN DEXTROSE-NACL 20-5-0.45 MEQ/L-%-% IV SOLN
INTRAVENOUS | Status: DC
  Administered 2014-01-01 – 2014-01-02 (×2): via INTRAVENOUS
  Administered 2014-01-03: 50 mL/h via INTRAVENOUS
  Filled 2014-01-01 (×9): qty 1000

## 2014-01-01 MED ORDER — ALBUTEROL SULFATE (2.5 MG/3ML) 0.083% IN NEBU
2.5000 mg | INHALATION_SOLUTION | Freq: Four times a day (QID) | RESPIRATORY_TRACT | Status: DC
  Administered 2014-01-01 – 2014-01-06 (×18): 2.5 mg via RESPIRATORY_TRACT
  Filled 2014-01-01 (×20): qty 3

## 2014-01-01 NOTE — Evaluation (Signed)
Physical Therapy Evaluation Patient Details Name: [name] MRN: [id number] DOB: Nov 18, 1927 Today's Date: 01/01/2014   History of Present Illness  Family states that he woke up last with sever leg pain he was sent to [facility] ER and was set home with muscle spasm. Family states he could not walk since then. Today he started to have shortness of breath and presented to ER he was found to have likely PNA.  He had a low grade fever up to 99.9 today. CT of the chest was done 3/31 and showed high-density particular matter within the collapsed left lower lobe, suggesting chronic aspiration. He had swallow study done showing evidence of aspiration   Clinical Impression  Pt limited by B LE weakness, R LE pain with mobility, requires total A and is unable to stand. Pt will benefit from skilled PT services to address deficits and decrease burden of care for d/c to SNF.    Follow Up Recommendations SNF    Equipment Recommendations  None recommended by PT    Recommendations for Other Services       Precautions / Restrictions Precautions Precautions: Fall Restrictions Weight Bearing Restrictions: No      Mobility  Bed Mobility Overal bed mobility: Needs Assistance Bed Mobility: Supine to Sit;Sit to Supine     Supine to sit: Mod assist Sit to supine: Min assist   General bed mobility comments: assist for LEs, R LE in severe pain with knee extension, pt grabbing hamstring, pain eases with knee flexion  Transfers Overall transfer level: Needs assistance   Transfers: Sit to/from Stand Sit to Stand: Total assist         General transfer comment: pt unable to come to a full stand despite total A, limited by LE pain and weakness  Ambulation/Gait                Stairs            Wheelchair Mobility    Modified Rankin (Stroke Patients Only)       Balance                                             Pertinent Vitals/Pain 10/10 R LE pain with  mobility, eases when not in extension    Home Living Family/patient expects to be discharged to:: Private residence Living Arrangements: Spouse/significant other;Children Available Help at Discharge: Family;Available PRN/intermittently Type of Home: Mobile home Home Access: Stairs to enter Entrance Stairs-Rails: Right;Left Entrance Stairs-Number of Steps: 5 Home Layout: One level Home Equipment: Cane - quad;Cane - single point;Bedside commode;Hospital bed;Shower seat;Toilet riser      Prior Function Level of Independence: Independent with assistive device(s)         Comments: daughter and son with each of their respective families are on either side of him.     Hand Dominance        Extremity/Trunk Assessment   Upper Extremity Assessment: Defer to OT evaluation           Lower Extremity Assessment: Generalized weakness (R LE in severe pain with knee extension, eases with flexion)      Cervical / Trunk Assessment: Kyphotic  Communication   Communication: No difficulties  Cognition Arousal/Alertness: Awake/alert Behavior During Therapy: WFL for tasks assessed/performed Overall Cognitive Status: Within Functional Limits for tasks assessed  General Comments      Exercises        Assessment/Plan    PT Assessment Patient needs continued PT services  PT Diagnosis Difficulty walking;Generalized weakness;Acute pain   PT Problem List Decreased strength;Decreased activity tolerance;Decreased balance;Decreased mobility;Pain  PT Treatment Interventions DME instruction;Balance training;Modalities;Gait training;Neuromuscular re-education;Stair training;Patient/family education;Functional mobility training;Wheelchair mobility training;Therapeutic activities;Therapeutic exercise   PT Goals (Current goals can be found in the Care Plan section) Acute Rehab PT Goals Patient Stated Goal: go to rehab PT Goal Formulation: With patient Time For  Goal Achievement: 01/15/14 Potential to Achieve Goals: Good    Frequency Min 3X/week   Barriers to discharge Decreased caregiver support;Inaccessible home environment pt unable to stand, has 5 steps    Co-evaluation               End of Session Equipment Utilized During Treatment: Gait belt Activity Tolerance: Patient limited by fatigue;Patient limited by pain Patient left: in bed;with call bell/phone within reach;with bed alarm set Nurse Communication: Mobility status         Time: 1340-1400 PT Time Calculation (min): 20 min   Charges:   PT Evaluation $Initial PT Evaluation Tier I: 1 Procedure     PT G Codes:          Kennith Gain 01/01/2014, 2:11 PM

## 2014-01-01 NOTE — Consult Note (Signed)
Name: [name] MRN: [id number] DOB: 1928/01/06    ADMISSION DATE:  12/31/2013 CONSULTATION DATE:  01/01/14  REFERRING MD :  [facility] PRIMARY SERVICE:  [facility]  CHIEF COMPLAINT:  Effusion, PNA  BRIEF PATIENT DESCRIPTION: 40 COPD (MR office) dysphagia aspiration, SOB  SIGNIFICANT EVENTS / STUDIES:  4/3 Ct chest - The left pleural effusion has not significantly changed in volume compared with the CT performed approximately 2 months ago.Progressive left lower lobe collapse without clear etiology. There is high-density particular matter within the collapsed left ower lobe, suggesting chronic aspiration. Depending on the results of thoracentesis, bronchoscopy may be warranted to exclude a centrally obstructing lesion. Increased patchy airspace disease at the right lung base,possibly atelectasis or aspiration. Several subacute left-sided rib fractures again noted, without significant change.Extensive atherosclerosis. 4/3 - US guided thora 1.2 liters, 632 WBC 56 %lymph, prot ratio estimate 0.64 = exudative, no ldh  CULTURES: 4/10 sputum>>> 4/10 BC>>>  ANTIBIOTICS: 4/8 zosyn>>> 4/8 vanc>>>  HISTORY OF PRESENT ILLNESS:  [name] is a 80 y.o. male dysphagia / TIA and have had recurrent episodes of PNA treated as out patient by his PCP this was followed by noted pleural effusion that was tapped on 4/3/1 showing reactive mesothelial cells 1.2 liters after CT demonstrated this effusion and LLL / atx/ PNA and concern aspiration in left lower lobe. Admitted now 4/8 leg pain then SOB. Pcxr taken reveals about same effusion left base on this admission. Consulted now for concern need bronch and effusion noted. No distress, no hemoptysis.  PAST MEDICAL HISTORY :  Past Medical History  Diagnosis Date  . Diabetes mellitus without complication   . Dysrhythmia     atrial fibrilation  . Hypertension   . Shortness of breath   . Peripheral vascular disease   . Claudication of calf muscles  11/05/2012    right calf  . GERD (gastroesophageal reflux disease)   . Arthritis   . TIA (transient ischemic attack) 2010   Past Surgical History  Procedure Laterality Date  . Foot surgery      foot surgery due to broken foot years ago  . Esophagogastroduodenoscopy N/A 11/02/2013    Procedure: ESOPHAGOGASTRODUODENOSCOPY (EGD);  Surgeon: Cleotis Nipper, MD;  Location: [facility] ENDOSCOPY;  Service: Endoscopy;  Laterality: N/A;   Prior to Admission medications   Medication Sig Start Date End Date Taking? Authorizing Provider  acetaminophen (TYLENOL) 500 MG tablet Take 1,000 mg by mouth every 6 (six) hours as needed for mild pain.   Yes Historical Provider, MD  albuterol (PROVENTIL HFA;VENTOLIN HFA) 108 (90 BASE) MCG/ACT inhaler Inhale 2 puffs into the lungs every 6 (six) hours as needed for wheezing or shortness of breath. 11/03/13  Yes Ripudeep Krystal Eaton, MD  amLODipine (NORVASC) 5 MG tablet Take 5 mg by mouth daily.   Yes Historical Provider, MD  budesonide (PULMICORT) 0.5 MG/2ML nebulizer solution Take 0.5 mg by nebulization 2 (two) times daily as needed (for wheezing/shortness of breath).    Yes Historical Provider, MD  carvedilol (COREG) 6.25 MG tablet Take 6.25 mg by mouth 2 (two) times daily with a meal.   Yes Historical Provider, MD  cilostazol (PLETAL) 50 MG tablet Take 50 mg by mouth 2 (two) times daily.   Yes Historical Provider, MD  loratadine (CLARITIN) 10 MG tablet Take 10 mg by mouth daily.   Yes Historical Provider, MD  magnesium oxide (MAG-OX) 400 MG tablet Take 200 mg by mouth daily.   Yes Historical Provider, MD  Melatonin 3 MG TABS Take 3 mg by mouth at bedtime.   Yes Historical Provider, MD  metFORMIN (GLUCOPHAGE) 1000 MG tablet Take 500 mg by mouth 2 (two) times daily with a meal.   Yes Historical Provider, MD  methocarbamol (ROBAXIN) 500 MG tablet Take 1 tablet (500 mg total) by mouth every 8 (eight) hours as needed for muscle spasms (or pain). 12/29/13  Yes Clayton Bibles, PA-C    Multiple Vitamins-Minerals (PRESERVISION AREDS PO) Take 2 capsules by mouth daily.   Yes Historical Provider, MD  ranitidine (ZANTAC) 300 MG tablet Take 300 mg by mouth at bedtime.   Yes Historical Provider, MD  Rivaroxaban (XARELTO) 20 MG TABS tablet Take 20 mg by mouth daily with supper.   Yes Historical Provider, MD  simvastatin (ZOCOR) 40 MG tablet Take 40 mg by mouth every evening.   Yes Historical Provider, MD  tiotropium (SPIRIVA) 18 MCG inhalation capsule Place 18 mcg into inhaler and inhale daily.   Yes Historical Provider, MD  vitamin B-12 (CYANOCOBALAMIN) 1000 MCG tablet Take 1,000 mcg by mouth daily.   Yes Historical Provider, MD   No Known Allergies  FAMILY HISTORY:  Family History  Problem Relation Age of Onset  . Cancer Neg Hx   . CAD Neg Hx    SOCIAL HISTORY:  reports that he quit smoking about 32 years ago. His smoking use included Cigarettes. He has a 105 pack-year smoking history. He has never used smokeless tobacco. He reports that he does not drink alcohol or use illicit drugs.  REVIEW OF SYSTEMS:   Constitutional: Negative for fever, chills, did have some wt loss but regained per wife HENT: Negative for hearing loss, ear pain, nosebleeds, congestion, sore throat, neck pain, tinnitus and ear discharge.   Eyes: Negative for blurred vision, double vision, photophobia, pain, discharge and redness.  Respiratory: Negative for cough, hemoptysis, sputum production, shortness of breath, wheezing and stridor.  does report secretions with eating Cardiovascular: Negative for chest pain, palpitations, orthopnea, claudication, leg swelling and PND.  Gastrointestinal: Negative for heartburn, nausea, vomiting, abdominal pain, diarrhea, constipation, blood in stool and melena.  Genitourinary: Negative for dysuria, urgency, frequency, hematuria and flank pain.  Musculoskeletal: leg pains as above Skin: Negative for itching and rash.  Neurological: Negative for dizziness, tingling,  tremors, sensory change, speech change, focal weakness, seizures, loss of consciousness, weakness and headaches.  Endo/Heme/Allergies: Negative for environmental allergies and polydipsia. Does not bruise/bleed easily.  SUBJECTIVE: no distress  VITAL SIGNS: Temp:  [97.6 F (36.4 C)-99.9 F (37.7 C)] 97.9 F (36.6 C) (04/11 0529) Pulse Rate:  [88-113] 88 (04/11 0529) Resp:  [18-20] 20 (04/11 0529) BP: (100-126)/(50-77) 111/68 mmHg (04/11 0529) SpO2:  [90 %-99 %] 96 % (04/11 0800) Weight:  [64.5 kg (142 lb 3.2 oz)-65.318 kg (144 lb)] 64.5 kg (142 lb 3.2 oz) (04/10 2202)  PHYSICAL EXAMINATION: General:  No distress Neuro:  Nonfocal, weak diffuse, perrl HEENT:  jvd wnl Neck:  No stridor, secretions sounds with cough Cardiovascular:  s1 s2 RRR Lungs:  ronchi left base Abdomen:  Soft, bs wnl, no r Skin:  bruising from IV left arm   Recent Labs Lab 12/29/13 0953 12/31/13 1835 01/01/14 0557  NA 139 134* 137  K 4.5 5.3 4.3  CL 101 99 100  CO2  --  24 27  BUN 26* 40* 36*  CREATININE 0.90 0.74 0.83  GLUCOSE 173* 169* 157*    Recent Labs Lab 12/29/13 0953 12/31/13 1835  HGB 11.2* 8.4*  HCT 33.0* 23.0*  WBC  --  13.9*  PLT  --  283   Dg Chest 2 View  12/31/2013   CLINICAL DATA:  Pneumonia  EXAM: CHEST  2 VIEW  COMPARISON:  CT CHEST W/O CM dated 12/24/2013; DG CHEST 1V dated 12/14/2013  FINDINGS: Mild cardiac enlargement. Aortic calcification. Calcified granuloma right lower lobe. Right lung otherwise clear except for mild scarring or atelectasis at the base.  On the left there is a moderate effusion with underlying lower lobe consolidation. This appears similar to CT scan performed 12/24/2013.  IMPRESSION: No significant change from recent CT scan with moderate left effusion and associated consolidated left lung.   Electronically Signed   By: Skipper Cliche M.D.   On: 12/31/2013 18:58    ASSESSMENT / PLAN:  Dysphagia  recurrent Likely exudative effusion likely secondary to  left lower lobe atx from chronic aspiration Aspiration PNA with noncompliance diet COPD  -first thora no ldh sent, but protein indicated exudative, this effusion  Is likely from LLL atx chronic from aspiration -no fevers, not toxic , unlikely empyema -he has had some wt loss but regained per wife, CT chest no mass left base -we need to get him compliant with his clear aspiration events and move the secretions in Left lower lobe in order to aerate this left lower lobe and improve associated effusion -no role bronch at this stage unless after the above plan no improvements, then would consider bronch assessment for endobronchial lesion -doppler legs to r/o dvt and this being infarcted left base, unlikely but with leg pain would r/o  -pcxr follow up -strict asp diet -add chest pt to move secretions, flutter, IS -consider kvo in am and lasix in future to help with effusion -narrow abx if culture neg -pct algo may limit duration abx -in end , if atx remains, will need repeat thora (and send for cytology) and then would CT chest after tap immediately for mass  D/w wife Will revisit monday  Lavon Paganini. Titus Mould, MD, [place] Pgr: [facility]  Pulmonary and [place] Pager: [phone]  01/01/2014, 10:09 AM

## 2014-01-01 NOTE — Progress Notes (Signed)
[facility] PROGRESS NOTE  [name] GEX:[id number] DOB: 03-Nov-1927 DOA: 12/31/2013 PCP: Curtis Gravel, MD  Assessment/Plan: Active Problems: PNA: Presumed to be from aspiration pneumonia. - Evaluation by speech therapist pending - Pulmonology consult at for recurrent pulmonary effusions  HTN (hypertension) - Pt on B blocker and blood pressure relatively well controlled on this.    Diabetes mellitus, type 2 - Continue SSI (sensitive scale) q 4 hrs while NPO - Place on MIVF's while NPO and awaiting speech therapy evaluation    COPD (chronic obstructive pulmonary disease) - Compensated currently continue bronchodilators    Atrial fibrillation - Rate controlled on carvedilol - Xarelto on hold secondary to anemia with positive Hemoccult   Anemia with positive Hemoccult - GI consulted and currently will follow along with [place]. - Continue holding xarelto    Pleural effusion, left - Presumed to be from aspiration based on pulmonologists evaluation - Will defer management recommendations to pulmonologists. Plan is to repeat Mickel Baas and sent for cytology and obtain CT chest after tap immediately to assess for mass    UTI (lower urinary tract infection) - Continue broad-spectrum antibiotics -Followup with urine culture  Code Status: Full code Family Communication: No family at bedside discussed directly with patient Disposition Plan: Pending further evaluation recommendations from specialist involved. For now continue broad-spectrum antibiotics   Consultants:  Gastroenterologist  Pulmonology  Procedures:  None  Antibiotics:  Zosyn and vancomycin  HPI/Subjective: No new complaints patient denies any bright blood per rectum  Objective: Filed Vitals:   01/01/14 0529  BP: 111/68  Pulse: 88  Temp: 97.9 F (36.6 C)  Resp: 20    Intake/Output Summary (Last 24 hours) at 01/01/14 1201 Last data filed at 01/01/14 1011  Gross per 24 hour  Intake 928.33 ml   Output    600 ml  Net 328.33 ml   Filed Weights   12/31/13 1810 12/31/13 2202  Weight: 65.318 kg (144 lb) 64.5 kg (142 lb 3.2 oz)    Exam:   General:  Pt in NAD, alert and awake  Cardiovascular: normal s1 and s2, no murmurs  Respiratory: decreased breath sounds over base of L lung field, no wheezes  Abdomen: soft, Nt, nd  Musculoskeletal: no cyanosis or clubbing   Data Reviewed: Basic Metabolic Panel:  Recent Labs Lab 12/29/13 0953 12/31/13 1835 01/01/14 0012 01/01/14 0557  NA 139 134*  --  137  K 4.5 5.3  --  4.3  CL 101 99  --  100  CO2  --  24  --  27  GLUCOSE 173* 169*  --  157*  BUN 26* 40*  --  36*  CREATININE 0.90 0.74  --  0.83  CALCIUM  --  8.8  --  9.0  MG  --   --  1.7  --    Liver Function Tests:  Recent Labs Lab 12/31/13 1835 01/01/14 0557  AST 54* 29  ALT 18 16  ALKPHOS 65 76  BILITOT 0.7 0.9  PROT 6.2 6.1  ALBUMIN 2.9* 2.8*   No results found for this basename: LIPASE, AMYLASE,  in the last 168 hours No results found for this basename: AMMONIA,  in the last 168 hours CBC:  Recent Labs Lab 12/29/13 0953 12/31/13 1835  WBC  --  13.9*  NEUTROABS  --  9.6*  HGB 11.2* 8.4*  HCT 33.0* 23.0*  MCV  --  94.7  PLT  --  283   Cardiac Enzymes: No results found for this  basename: CKTOTAL, CKMB, CKMBINDEX, TROPONINI,  in the last 168 hours BNP (last 3 results) No results found for this basename: PROBNP,  in the last 8760 hours CBG:  Recent Labs Lab 12/31/13 2330 01/01/14 0149 01/01/14 0457 01/01/14 0745  GLUCAP 165* 143* 136* 123*    Recent Results (from the past 240 hour(s))  BODY FLUID CULTURE     Status: None   Collection Time    12/24/13 11:00 AM      Result Value Ref Range Status   Specimen Description PLEURAL FLUID LEFT   Final   Special Requests 60ML FLUID   Final   Gram Stain     Final   Value: WBC PRESENT,BOTH PMN AND MONONUCLEAR     NO ORGANISMS SEEN     Performed at [facility]   Culture     Final    Value: NO GROWTH 3 DAYS     Performed at [facility]   Report Status 12/28/2013 FINAL   Final     Studies: Dg Chest 2 View  12/31/2013   CLINICAL DATA:  Pneumonia  EXAM: CHEST  2 VIEW  COMPARISON:  CT CHEST W/O CM dated 12/24/2013; DG CHEST 1V dated 12/14/2013  FINDINGS: Mild cardiac enlargement. Aortic calcification. Calcified granuloma right lower lobe. Right lung otherwise clear except for mild scarring or atelectasis at the base.  On the left there is a moderate effusion with underlying lower lobe consolidation. This appears similar to CT scan performed 12/24/2013.  IMPRESSION: No significant change from recent CT scan with moderate left effusion and associated consolidated left lung.   Electronically Signed   By: Skipper Cliche M.D.   On: 12/31/2013 18:58    Scheduled Meds: . [COMPLETED] sodium chloride   Intravenous STAT  . albuterol  2.5 mg Nebulization QID  . carvedilol  6.25 mg Oral BID WC  . cilostazol  50 mg Oral BID  . famotidine  10 mg Oral Daily  . insulin aspart  0-9 Units Subcutaneous 6 times per day  . loratadine  10 mg Oral Daily  . metoprolol  2.5 mg Intravenous Once  . piperacillin-tazobactam (ZOSYN)  IV  3.375 g Intravenous Q8H  . simvastatin  40 mg Oral QPM  . tiotropium  18 mcg Inhalation Daily  . vancomycin  500 mg Intravenous Q12H   Continuous Infusions:    Time spent: > 35 minutes    [facility] Pager [phone] If 7PM-7AM, please contact night-coverage at [email or web address], password [facility] 01/01/2014, 12:01 PM  LOS: 1 day

## 2014-01-01 NOTE — Evaluation (Signed)
Clinical/Bedside Swallow Evaluation Patient Details  Name: [name] MRN: [id number] Date of Birth: 04/27/1928  Today's Date: 01/01/2014 Time: 2595-6387 SLP Time Calculation (min): 20 min  Past Medical History:  Past Medical History  Diagnosis Date  . Diabetes mellitus without complication   . Dysrhythmia     atrial fibrilation  . Hypertension   . Shortness of breath   . Peripheral vascular disease   . Claudication of calf muscles 11/05/2012    right calf  . GERD (gastroesophageal reflux disease)   . Arthritis   . TIA (transient ischemic attack) 2010   Past Surgical History:  Past Surgical History  Procedure Laterality Date  . Foot surgery      foot surgery due to broken foot years ago  . Esophagogastroduodenoscopy N/A 11/02/2013    Procedure: ESOPHAGOGASTRODUODENOSCOPY (EGD);  Surgeon: Cleotis Nipper, MD;  Location: [facility] ENDOSCOPY;  Service: Endoscopy;  Laterality: N/A;   HPI:  80 y.o. male with hx of dysphagia, CVA 10/14, admitted 4/8 with leg pain, SOB, PNA.  Pt has had recurrent episodes of PNA; pleural effusion that was tapped on 4/3/1 showed reactive mesothelial cells,   LLL / atx/ PNA and concern for aspiration in left lower lobe. 4/10 CXR reveals no significant change from recent CT scan with moderate left effusion and associated consolidated left lung. OPMBS on 12/20/13 confirmed aspiration of thin liquids with compensatory strategies ineffective for airway protection; pt tolerated nectar-thick liquids without aspiration.  At that time, regular diet with nectar-thick liquids was recommended, as well as OPSLP treatment for dysphagia.    Assessment / Plan / Recommendation Clinical Impression  Pt continues to present with dysphagia and overt signs of aspiration when consuming thin liquids.  He is unable to recall the 3/30 swallow study nor the recommendations for thickened liquids.  During today's assessment, he appeared to safely tolerate mechanical solids and  nectar-thick liquids.  Reviewed precautions, diet modifications; pt unable to recall information without cues.  Recommend a dysphagia 3, nectar-thick liquid diet; OPSLP to address therapeutic exercise.  Will follow during hospitalization.      Aspiration Risk  Moderate    Diet Recommendation Dysphagia 3 (Mechanical Soft);Nectar-thick liquid   Liquid Administration via: Cup Medication Administration: Whole meds with puree Supervision: Patient able to self feed Compensations: Slow rate;Small sips/bites;Multiple dry swallows after each bite/sip Postural Changes and/or Swallow Maneuvers: Seated upright 90 degrees    Other  Recommendations Oral Care Recommendations: Oral care BID Other Recommendations: Order thickener from pharmacy   Follow Up Recommendations  Outpatient SLP    Frequency and Duration min 2x/week       Swallow Study Prior Functional Status  Type of Home: Mobile home Available Help at Discharge: Family;Available PRN/intermittently    General Date of Onset:  (chronic) Type of Study: Bedside swallow evaluation  Previous Swallow Assessment: MBS 12/20/13: Dysphagia Diagnosis: Moderate pharyngeal phase dysphagia;Mild pharyngeal phase dysphagia.  Clinical impression: Patient presents with a mild-moderate sensori-motor based pharyngeal dysphagia characterized by combination of delayed swallow initiation and base of tongue/laryngeal/pharyngeal weakness resulting in decreased airway protection with thin liquids and mild post-swallow residuals. SLP provided moderate-max cueing for use of chin tuck and altered bolus sizes which did not improve ability to protect airway. Patient was able to protect airway with solids and nectar thick liquids  Diet Prior to this Study: NPO Respiratory Status: Nasal cannula History of Recent Intubation: No Behavior/Cognition: Alert;Cooperative;Pleasant mood Oral Cavity - Dentition: Missing dentition Self-Feeding Abilities: Able to feed self Patient  Positioning: Upright in bed Baseline Vocal Quality: Clear Volitional Cough: Strong Volitional Swallow: Able to elicit    Oral/Motor/Sensory Function Overall Oral Motor/Sensory Function: Appears within functional limits for tasks assessed   Ice Chips Ice chips: Not tested   Thin Liquid Thin Liquid: Impaired Presentation: Cup Pharyngeal  Phase Impairments: Wet Vocal Quality;Multiple swallows;Cough - Immediate    Nectar Thick Nectar Thick Liquid: Within functional limits Presentation: Cup   Honey Thick Honey Thick Liquid: Not tested   Puree Puree: Impaired Presentation: Self Fed;Spoon Pharyngeal Phase Impairments: Multiple swallows   Solid    Meta Kroenke L. [place], [place] CCC/SLP Pager [phone]  Solid: Impaired Presentation: Self Fed Pharyngeal Phase Impairments: Multiple swallows       Frederich Balding Shirl [place] 01/01/2014,3:20 PM

## 2014-01-01 NOTE — ED Provider Notes (Signed)
Medical screening examination/treatment/procedure(s) were performed by non-physician practitioner and as supervising physician I was immediately available for consultation/collaboration.   Dot Lanes, MD 01/01/14 1126

## 2014-01-01 NOTE — Consult Note (Signed)
[facility] Admission History & Physical  Chief Complaint: Consult for anemia and heme positive stools HPI: [name] is an 80 y.o. elderly white male.  With multiple medical problems admitted with aspiration pneumonia. A hemoglobin of 11 admission with a fall to 8.4 without any reported grossly bloody stools although he is Hemoccult-positive. During her previous admission in January he was also evaluated for heme positive stools and had an EGD done that was unrevealing. He was on xarelto for atrial fibrillation and this was held briefly but resumed. Colonoscopy was addressed but due to his elderly frail state with significant cardiopulmonary comorbidities, colonoscopy was felt to be reserved for recurrent significant bleeding. He had a colonoscopy 5 years ago which did show an adenomatous colon polyp according to Dr. Osborn Coho note. The patient is not a good historian but does not note any black or bloody stools.  Past Medical History  Diagnosis Date  . Diabetes mellitus without complication   . Dysrhythmia     atrial fibrilation  . Hypertension   . Shortness of breath   . Peripheral vascular disease   . Claudication of calf muscles 11/05/2012    right calf  . GERD (gastroesophageal reflux disease)   . Arthritis   . TIA (transient ischemic attack) 2010    Past Surgical History  Procedure Laterality Date  . Foot surgery      foot surgery due to broken foot years ago  . Esophagogastroduodenoscopy N/A 11/02/2013    Procedure: ESOPHAGOGASTRODUODENOSCOPY (EGD);  Surgeon: Cleotis Nipper, MD;  Location: [facility] ENDOSCOPY;  Service: Endoscopy;  Laterality: N/A;    Medications Prior to Admission  Medication Sig Dispense Refill  . acetaminophen (TYLENOL) 500 MG tablet Take 1,000 mg by mouth every 6 (six) hours as needed for mild pain.      Marland Kitchen albuterol (PROVENTIL HFA;VENTOLIN HFA) 108 (90 BASE) MCG/ACT inhaler Inhale 2 puffs into the lungs every 6 (six) hours as needed for  wheezing or shortness of breath.  1 Inhaler  2  . amLODipine (NORVASC) 5 MG tablet Take 5 mg by mouth daily.      . budesonide (PULMICORT) 0.5 MG/2ML nebulizer solution Take 0.5 mg by nebulization 2 (two) times daily as needed (for wheezing/shortness of breath).       . carvedilol (COREG) 6.25 MG tablet Take 6.25 mg by mouth 2 (two) times daily with a meal.      . cilostazol (PLETAL) 50 MG tablet Take 50 mg by mouth 2 (two) times daily.      Marland Kitchen loratadine (CLARITIN) 10 MG tablet Take 10 mg by mouth daily.      . magnesium oxide (MAG-OX) 400 MG tablet Take 200 mg by mouth daily.      . Melatonin 3 MG TABS Take 3 mg by mouth at bedtime.      . metFORMIN (GLUCOPHAGE) 1000 MG tablet Take 500 mg by mouth 2 (two) times daily with a meal.      . methocarbamol (ROBAXIN) 500 MG tablet Take 1 tablet (500 mg total) by mouth every 8 (eight) hours as needed for muscle spasms (or pain).  20 tablet  0  . Multiple Vitamins-Minerals (PRESERVISION AREDS PO) Take 2 capsules by mouth daily.      . ranitidine (ZANTAC) 300 MG tablet Take 300 mg by mouth at bedtime.      . Rivaroxaban (XARELTO) 20 MG TABS tablet Take 20 mg by mouth daily with supper.      . simvastatin (ZOCOR) 40  MG tablet Take 40 mg by mouth every evening.      . tiotropium (SPIRIVA) 18 MCG inhalation capsule Place 18 mcg into inhaler and inhale daily.      . vitamin B-12 (CYANOCOBALAMIN) 1000 MCG tablet Take 1,000 mcg by mouth daily.        Allergies: No Known Allergies  Family History  Problem Relation Age of Onset  . Cancer Neg Hx   . CAD Neg Hx     Social History:  reports that he quit smoking about 32 years ago. His smoking use included Cigarettes. He has a 105 pack-year smoking history. He has never used smokeless tobacco. He reports that he does not drink alcohol or use illicit drugs.  Review of Systems: negative except somewhat poor historian, ROS outlined in the admission history and physical   Blood pressure 111/68, pulse 88,  temperature 97.9 F (36.6 C), temperature source Oral, resp. rate 20, height $RemoveBe'5\' 4"'CUdgrLFED$  (1.626 m), weight 64.5 kg (142 lb 3.2 oz), SpO2 96.00%. Head: Normocephalic, without obvious abnormality, atraumatic Neck: no adenopathy, no carotid bruit, no JVD, supple, symmetrical, trachea midline and thyroid not enlarged, symmetric, no tenderness/mass/nodules Resp: Coarse breath sounds bilaterally Cardio: regular rate and rhythm, S1, S2 normal, no murmur, click, rub or gallop GI: Abdomen soft, nontender nondistended with normoactive bowel sounds Extremities: extremities normal, atraumatic, no cyanosis or edema  Results for orders placed during the hospital encounter of 12/31/13 (from the past 48 hour(s))  CBC WITH DIFFERENTIAL     Status: Abnormal   Collection Time    12/31/13  6:35 PM      Result Value Ref Range   WBC 13.9 (*) 4.0 - 10.5 K/uL   RBC 2.43 (*) 4.22 - 5.81 MIL/uL   Hemoglobin 8.4 (*) 13.0 - 17.0 g/dL   HCT 23.0 (*) 39.0 - 52.0 %   MCV 94.7  78.0 - 100.0 fL   MCH 34.6 (*) 26.0 - 34.0 pg   MCHC 36.5 (*) 30.0 - 36.0 g/dL   RDW 12.9  11.5 - 15.5 %   Platelets 283  150 - 400 K/uL   Neutrophils Relative % 69  43 - 77 %   Neutro Abs 9.6 (*) 1.7 - 7.7 K/uL   Lymphocytes Relative 9 (*) 12 - 46 %   Lymphs Abs 1.2  0.7 - 4.0 K/uL   Monocytes Relative 22 (*) 3 - 12 %   Monocytes Absolute 3.1 (*) 0.1 - 1.0 K/uL   Eosinophils Relative 0  0 - 5 %   Eosinophils Absolute 0.0  0.0 - 0.7 K/uL   Basophils Relative 0  0 - 1 %   Basophils Absolute 0.0  0.0 - 0.1 K/uL  COMPREHENSIVE METABOLIC PANEL     Status: Abnormal   Collection Time    12/31/13  6:35 PM      Result Value Ref Range   Sodium 134 (*) 137 - 147 mEq/L   Potassium 5.3  3.7 - 5.3 mEq/L   Comment: MARKED HEMOLYSIS     HEMOLYSIS AT THIS LEVEL MAY AFFECT RESULT   Chloride 99  96 - 112 mEq/L   CO2 24  19 - 32 mEq/L   Glucose, Bld 169 (*) 70 - 99 mg/dL   BUN 40 (*) 6 - 23 mg/dL   Creatinine, Ser 0.74  0.50 - 1.35 mg/dL   Calcium 8.8   8.4 - 10.5 mg/dL   Total Protein 6.2  6.0 - 8.3 g/dL   Albumin 2.9 (*) 3.5 -  5.2 g/dL   AST 54 (*) 0 - 37 U/L   Comment: MARKED HEMOLYSIS     HEMOLYSIS AT THIS LEVEL MAY AFFECT RESULT   ALT 18  0 - 53 U/L   Comment: MARKED HEMOLYSIS     HEMOLYSIS AT THIS LEVEL MAY AFFECT RESULT   Alkaline Phosphatase 65  39 - 117 U/L   Comment: MARKED HEMOLYSIS     HEMOLYSIS AT THIS LEVEL MAY AFFECT RESULT   Total Bilirubin 0.7  0.3 - 1.2 mg/dL   GFR calc non Af Amer 83 (*) >90 mL/min   GFR calc Af Amer >90  >90 mL/min   Comment: (NOTE)     The eGFR has been calculated using the CKD EPI equation.     This calculation has not been validated in all clinical situations.     eGFR's persistently <90 mL/min signify possible Chronic Kidney     Disease.  I-STAT CG4 LACTIC ACID, ED     Status: None   Collection Time    12/31/13  6:42 PM      Result Value Ref Range   Lactic Acid, Venous 1.23  0.5 - 2.2 mmol/L  URINALYSIS, ROUTINE W REFLEX MICROSCOPIC     Status: Abnormal   Collection Time    12/31/13  7:10 PM      Result Value Ref Range   Color, Urine AMBER (*) YELLOW   Comment: BIOCHEMICALS MAY BE AFFECTED BY COLOR   APPearance CLEAR  CLEAR   Specific Gravity, Urine 1.024  1.005 - 1.030   pH 5.0  5.0 - 8.0   Glucose, UA NEGATIVE  NEGATIVE mg/dL   Hgb urine dipstick NEGATIVE  NEGATIVE   Bilirubin Urine NEGATIVE  NEGATIVE   Ketones, ur NEGATIVE  NEGATIVE mg/dL   Protein, ur NEGATIVE  NEGATIVE mg/dL   Urobilinogen, UA 1.0  0.0 - 1.0 mg/dL   Nitrite NEGATIVE  NEGATIVE   Leukocytes, UA SMALL (*) NEGATIVE  URINE MICROSCOPIC-ADD ON     Status: Abnormal   Collection Time    12/31/13  7:10 PM      Result Value Ref Range   Squamous Epithelial / LPF RARE  RARE   WBC, UA 7-10  <3 WBC/hpf   RBC / HPF 3-6  <3 RBC/hpf   Bacteria, UA RARE  RARE   Casts GRANULAR CAST (*) NEGATIVE  POC OCCULT BLOOD, ED     Status: Abnormal   Collection Time    12/31/13  8:19 PM      Result Value Ref Range   Fecal Occult  Bld POSITIVE (*) NEGATIVE  HEMOGLOBIN A1C     Status: Abnormal   Collection Time    12/31/13 11:30 PM      Result Value Ref Range   Hemoglobin A1C 6.1 (*) <5.7 %   Comment: (NOTE)                                                                               According to the ADA Clinical Practice Recommendations for 2011, when     HbA1c is used as a screening test:      >=6.5%   Diagnostic of Diabetes Mellitus               (  if abnormal result is confirmed)     5.7-6.4%   Increased risk of developing Diabetes Mellitus     References:Diagnosis and Classification of Diabetes Mellitus,Diabetes     SKAJ,6811,57(WIOMB 1):S62-S69 and Standards of Medical Care in             Diabetes - 2011,Diabetes TDHR,4163,84 (Suppl 1):S11-S61.   Mean Plasma Glucose 128 (*) <117 mg/dL   Comment: Performed at [place]     Status: Abnormal   Collection Time    12/31/13 11:30 PM      Result Value Ref Range   Vitamin B-12 1791 (*) 211 - 911 pg/mL   Comment: Performed at [place]     Status: None   Collection Time    12/31/13 11:30 PM      Result Value Ref Range   Folate 15.3     Comment: (NOTE)     Reference Ranges            Deficient:       0.4 - 3.3 ng/mL            Indeterminate:   3.4 - 5.4 ng/mL            Normal:              > 5.4 ng/mL     Performed at [place]     Status: Abnormal   Collection Time    12/31/13 11:30 PM      Result Value Ref Range   Iron 24 (*) 42 - 135 ug/dL   TIBC 205 (*) 215 - 435 ug/dL   Saturation Ratios 12 (*) 20 - 55 %   UIBC 181  125 - 400 ug/dL   Comment: Performed at [place]     Status: Abnormal   Collection Time    12/31/13 11:30 PM      Result Value Ref Range   Ferritin 324 (*) 22 - 322 ng/mL   Comment: Performed at [place]     Status: Abnormal   Collection Time    12/31/13 11:30 PM      Result Value Ref Range   Retic Ct Pct 2.2   0.4 - 3.1 %   RBC. 2.41 (*) 4.22 - 5.81 MIL/uL   Retic Count, Manual 53.0  19.0 - 186.0 K/uL  TYPE AND SCREEN     Status: None   Collection Time    12/31/13 11:30 PM      Result Value Ref Range   ABO/RH(D) O POS     Antibody Screen NEG     Sample Expiration 01/03/2014    GLUCOSE, CAPILLARY     Status: Abnormal   Collection Time    12/31/13 11:30 PM      Result Value Ref Range   Glucose-Capillary 165 (*) 70 - 99 mg/dL  MAGNESIUM     Status: None   Collection Time    01/01/14 12:12 AM      Result Value Ref Range   Magnesium 1.7  1.5 - 2.5 mg/dL  GLUCOSE, CAPILLARY     Status: Abnormal   Collection Time    01/01/14  1:49 AM      Result Value Ref Range   Glucose-Capillary 143 (*) 70 - 99 mg/dL  GLUCOSE, CAPILLARY     Status: Abnormal   Collection Time    01/01/14  4:57 AM  Result Value Ref Range   Glucose-Capillary 136 (*) 70 - 99 mg/dL   Comment 1 Notify RN     Comment 2 Documented in Chart    COMPREHENSIVE METABOLIC PANEL     Status: Abnormal   Collection Time    01/01/14  5:57 AM      Result Value Ref Range   Sodium 137  137 - 147 mEq/L   Potassium 4.3  3.7 - 5.3 mEq/L   Comment: DELTA CHECK NOTED     REPEATED TO VERIFY   Chloride 100  96 - 112 mEq/L   CO2 27  19 - 32 mEq/L   Glucose, Bld 157 (*) 70 - 99 mg/dL   BUN 36 (*) 6 - 23 mg/dL   Creatinine, Ser 0.83  0.50 - 1.35 mg/dL   Calcium 9.0  8.4 - 10.5 mg/dL   Total Protein 6.1  6.0 - 8.3 g/dL   Albumin 2.8 (*) 3.5 - 5.2 g/dL   AST 29  0 - 37 U/L   ALT 16  0 - 53 U/L   Alkaline Phosphatase 76  39 - 117 U/L   Total Bilirubin 0.9  0.3 - 1.2 mg/dL   GFR calc non Af Amer 79 (*) >90 mL/min   GFR calc Af Amer >90  >90 mL/min   Comment: (NOTE)     The eGFR has been calculated using the CKD EPI equation.     This calculation has not been validated in all clinical situations.     eGFR's persistently <90 mL/min signify possible Chronic Kidney     Disease.  GLUCOSE, CAPILLARY     Status: Abnormal   Collection Time     01/01/14  7:45 AM      Result Value Ref Range   Glucose-Capillary 123 (*) 70 - 99 mg/dL   Dg Chest 2 View  12/31/2013   CLINICAL DATA:  Pneumonia  EXAM: CHEST  2 VIEW  COMPARISON:  CT CHEST W/O CM dated 12/24/2013; DG CHEST 1V dated 12/14/2013  FINDINGS: Mild cardiac enlargement. Aortic calcification. Calcified granuloma right lower lobe. Right lung otherwise clear except for mild scarring or atelectasis at the base.  On the left there is a moderate effusion with underlying lower lobe consolidation. This appears similar to CT scan performed 12/24/2013.  IMPRESSION: No significant change from recent CT scan with moderate left effusion and associated consolidated left lung.   Electronically Signed   By: Skipper Cliche M.D.   On: 12/31/2013 18:58    Assessment: Anemia and heme positive stool probably subacute or occult, exacerbated by xarelto, recent EGD negative, last colonoscopy 5 years ago showing no advanced neoplasm Plan: Will  hold off on sedation /colonoscopy while treating his aspiration pneumonia. Overall risk/benefit of colonoscopy questionable, will probably will depend on his degree and the timing of recovery from pneumonia. We'll follow with you and monitor stools and hemoglobin, transfuse as necessary. May need to reassess the long-term risk to benefit ratio of anticoagulation  Missy Sabins 01/01/2014, 10:07 AM

## 2014-01-01 NOTE — Progress Notes (Signed)
Alert from CMT that patient had 4 beats of v-tach. Patient experiencing no chest pain or shortness of breath at the time, BP ws 100/50 and HR was 90s-100s in a-fib, which is his baseline. Rogue Bussing, NP notified. Mg level ordered for patient. Will continue to monitor.

## 2014-01-01 NOTE — Progress Notes (Addendum)
VASCULAR LAB PRELIMINARY  PRELIMINARY  PRELIMINARY  PRELIMINARY  Right lower extremity venous duplex completed.    Preliminary report:  Right:  No evidence of DVT, superficial thrombosis, or Baker's cyst.   [name] [name], RVT 01/01/2014, 9:58 AM    ABI completed:  Right indicates mild decrease in flow.  Left not ascertainable due to calcified vessels.    RIGHT    LEFT    PRESSURE WAVEFORM  PRESSURE WAVEFORM  BRACHIAL 114 Triphasic  BRACHIAL Arm in sling   DP 83 Triphasic  DP 301 Monophasic   AT   AT    PT 90 Monophasic  PT 301 Biphasic   PER   PER    GREAT TOE  NA GREAT TOE  NA    RIGHT LEFT  ABI 0.79 NA due to calcified vessels     [name], RVT 01/01/2014, 11:29 AM

## 2014-01-02 DIAGNOSIS — I319 Disease of pericardium, unspecified: Secondary | ICD-10-CM

## 2014-01-02 LAB — LEGIONELLA ANTIGEN, URINE: Legionella Antigen, Urine: NEGATIVE

## 2014-01-02 LAB — CBC
HCT: 22.7 % — ABNORMAL LOW (ref 39.0–52.0)
HEMOGLOBIN: 7.7 g/dL — AB (ref 13.0–17.0)
MCH: 33.6 pg (ref 26.0–34.0)
MCHC: 33.9 g/dL (ref 30.0–36.0)
MCV: 99.1 fL (ref 78.0–100.0)
PLATELETS: 218 10*3/uL (ref 150–400)
RBC: 2.29 MIL/uL — ABNORMAL LOW (ref 4.22–5.81)
RDW: 13.3 % (ref 11.5–15.5)
WBC: 7.2 10*3/uL (ref 4.0–10.5)

## 2014-01-02 LAB — APTT: aPTT: 38 seconds — ABNORMAL HIGH (ref 24–37)

## 2014-01-02 LAB — GLUCOSE, CAPILLARY
GLUCOSE-CAPILLARY: 220 mg/dL — AB (ref 70–99)
GLUCOSE-CAPILLARY: 230 mg/dL — AB (ref 70–99)
Glucose-Capillary: 143 mg/dL — ABNORMAL HIGH (ref 70–99)
Glucose-Capillary: 146 mg/dL — ABNORMAL HIGH (ref 70–99)
Glucose-Capillary: 199 mg/dL — ABNORMAL HIGH (ref 70–99)

## 2014-01-02 LAB — PROTIME-INR
INR: 1.21 (ref 0.00–1.49)
Prothrombin Time: 15 seconds (ref 11.6–15.2)

## 2014-01-02 LAB — PROCALCITONIN: Procalcitonin: <0.1 ng/mL

## 2014-01-02 MED ORDER — ENSURE PUDDING PO PUDG
1.0000 | Freq: Three times a day (TID) | ORAL | Status: DC
  Administered 2014-01-02 – 2014-01-06 (×9): 1 via ORAL
  Filled 2014-01-02 (×14): qty 1

## 2014-01-02 NOTE — Progress Notes (Signed)
[facility] Progress Note  Subjective: Pt without complaints, no bowel movements since admission  Objective: Vital signs in last 24 hours: Temp:  [97.2 F (36.2 C)-97.5 F (36.4 C)] 97.5 F (36.4 C) (04/12 0435) Pulse Rate:  [91-106] 97 (04/12 0435) Resp:  [20] 20 (04/12 0435) BP: (110-126)/(59-68) 126/68 mmHg (04/12 0435) SpO2:  [94 %-100 %] 95 % (04/12 0740) Weight change:    PE: Abdomen soft  Lab Results: Results for orders placed during the hospital encounter of 12/31/13 (from the past 24 hour(s))  GLUCOSE, CAPILLARY     Status: Abnormal   Collection Time    01/01/14 12:14 PM      Result Value Ref Range   Glucose-Capillary 109 (*) 70 - 99 mg/dL  GLUCOSE, CAPILLARY     Status: Abnormal   Collection Time    01/01/14  4:40 PM      Result Value Ref Range   Glucose-Capillary 137 (*) 70 - 99 mg/dL  GLUCOSE, CAPILLARY     Status: Abnormal   Collection Time    01/01/14  8:43 PM      Result Value Ref Range   Glucose-Capillary 163 (*) 70 - 99 mg/dL   Comment 1 Notify RN     Comment 2 Documented in Chart    GLUCOSE, CAPILLARY     Status: Abnormal   Collection Time    01/01/14 11:57 PM      Result Value Ref Range   Glucose-Capillary 164 (*) 70 - 99 mg/dL   Comment 1 Documented in Chart     Comment 2 Notify RN    GLUCOSE, CAPILLARY     Status: Abnormal   Collection Time    01/02/14  4:20 AM      Result Value Ref Range   Glucose-Capillary 143 (*) 70 - 99 mg/dL  APTT     Status: Abnormal   Collection Time    01/02/14  5:55 AM      Result Value Ref Range   aPTT 38 (*) 24 - 37 seconds  PROTIME-INR     Status: None   Collection Time    01/02/14  5:55 AM      Result Value Ref Range   Prothrombin Time 15.0  11.6 - 15.2 seconds   INR 1.21  0.00 - 1.49  CBC     Status: Abnormal   Collection Time    01/02/14  5:55 AM      Result Value Ref Range   WBC 7.2  4.0 - 10.5 K/uL   RBC 2.29 (*) 4.22 - 5.81 MIL/uL   Hemoglobin 7.7 (*) 13.0 - 17.0 g/dL   HCT 22.7 (*)  39.0 - 52.0 %   MCV 99.1  78.0 - 100.0 fL   MCH 33.6  26.0 - 34.0 pg   MCHC 33.9  30.0 - 36.0 g/dL   RDW 13.3  11.5 - 15.5 %   Platelets 218  150 - 400 K/uL  GLUCOSE, CAPILLARY     Status: Abnormal   Collection Time    01/02/14  7:36 AM      Result Value Ref Range   Glucose-Capillary 146 (*) 70 - 99 mg/dL  PROCALCITONIN     Status: None   Collection Time    01/02/14  9:07 AM      Result Value Ref Range   Procalcitonin <0.10      Studies/Results: Dg Chest 2 View  12/31/2013   CLINICAL DATA:  Pneumonia  EXAM: CHEST  2 VIEW  COMPARISON:  CT CHEST W/O CM dated 12/24/2013; DG CHEST 1V dated 12/14/2013  FINDINGS: Mild cardiac enlargement. Aortic calcification. Calcified granuloma right lower lobe. Right lung otherwise clear except for mild scarring or atelectasis at the base.  On the left there is a moderate effusion with underlying lower lobe consolidation. This appears similar to CT scan performed 12/24/2013.  IMPRESSION: No significant change from recent CT scan with moderate left effusion and associated consolidated left lung.   Electronically Signed   By: Skipper Cliche M.D.   On: 12/31/2013 18:58   US Abdomen Limited Ruq  01/01/2014   CLINICAL DATA:  Clinical diagnosis of hepatic cirrhosis  EXAM: US ABDOMEN LIMITED - RIGHT UPPER QUADRANT  COMPARISON:  CT CHEST W/O CM dated 12/24/2013  FINDINGS: Gallbladder:  No gallstones or wall thickening visualized. No sonographic Murphy sign noted.  Common bile duct:  Diameter: 5 mm  Liver:  The echotexture of the liver appears normal. Portal venous flow is normal in direction toward the liver. There is no intrahepatic ductal dilation. The surface of the visualized portions of the liver appears normal. The left hepatic lobe was obscured by bowel gas.  IMPRESSION: There is no objective evidence of hepatic cirrhosis on this study. No acute abnormality of the gallbladder or common bile duct is demonstrated either.   Electronically Signed   By: David  Martinique   On:  01/01/2014 14:43      Assessment: Heme positive stools and anemia on xarelto, recent EGD for same issue unrevealing Aspiration pneumonia  Plan: After adequate treatment of aspiration pneumonia, consider colonoscopy. Transfuse when necessary Will follow with you    Missy Sabins 01/02/2014, 10:57 AM

## 2014-01-02 NOTE — Progress Notes (Signed)
01/02/2014 1420 Notified [facility] of admission. Jonnie Finner RN CCM Case Mgmt phone [phone]

## 2014-01-02 NOTE — Progress Notes (Signed)
[facility] PROGRESS NOTE  [name] VQQ:[id number] DOB: 08-09-1928 DOA: 12/31/2013 PCP: [name], MD  Assessment/Plan: PNA: Presumed to be from aspiration pneumonia. - SLP evaluated, dys 3 - Pulmonology consult for recurrent pulmonary effusions  HTN (hypertension) - Pt on B blocker and blood pressure relatively well controlled on this.  Diabetes mellitus, type 2 - Continue SSI (sensitive scale) q 4 hrs while NPO - Place on MIVF's while NPO and awaiting speech therapy evaluation  COPD (chronic obstructive pulmonary disease) - Compensated currently continue bronchodilators  Atrial fibrillation - Rate controlled on carvedilol - Xarelto on hold secondary to anemia with positive Hemoccult   Anemia with positive Hemoccult - GI consulted and currently will follow along with [place]. - Continue holding xarelto - Hb 7.7 this morning,   Pleural effusion, left - Presumed to be from aspiration based on pulmonologists evaluation - Will defer management recommendations to pulmonologists.   UTI (lower urinary tract infection) - Continue broad-spectrum antibiotics - Followup with urine culture  Code Status: Full code Family Communication: No family at bedside discussed directly with patient Disposition Plan: SNF when ready. Patient and wife agreeable.    Consultants:  Gastroenterologist  Pulmonology  Procedures:  None  Antibiotics:  Zosyn and vancomycin  HPI/Subjective: No new complaints patient denies any bright blood per rectum  Objective: Filed Vitals:   01/02/14 0435  BP: 126/68  Pulse: 97  Temp: 97.5 F (36.4 C)  Resp: 20    Intake/Output Summary (Last 24 hours) at 01/02/14 0841 Last data filed at 01/02/14 0700  Gross per 24 hour  Intake 2261.67 ml  Output   1290 ml  Net 971.67 ml   Filed Weights   12/31/13 1810 12/31/13 2202  Weight: 65.318 kg (144 lb) 64.5 kg (142 lb 3.2 oz)    Exam:   General:  Pt in NAD, alert and  awake  Cardiovascular: normal s1 and s2, no murmurs  Respiratory: decreased breath sounds over base of L lung field, no wheezes  Abdomen: soft, Nt, nd  Musculoskeletal: no cyanosis or clubbing   Data Reviewed: Basic Metabolic Panel:  Recent Labs Lab 12/29/13 0953 12/31/13 1835 01/01/14 0012 01/01/14 0557  NA 139 134*  --  137  K 4.5 5.3  --  4.3  CL 101 99  --  100  CO2  --  24  --  27  GLUCOSE 173* 169*  --  157*  BUN 26* 40*  --  36*  CREATININE 0.90 0.74  --  0.83  CALCIUM  --  8.8  --  9.0  MG  --   --  1.7  --    Liver Function Tests:  Recent Labs Lab 12/31/13 1835 01/01/14 0557  AST 54* 29  ALT 18 16  ALKPHOS 65 76  BILITOT 0.7 0.9  PROT 6.2 6.1  ALBUMIN 2.9* 2.8*   No results found for this basename: LIPASE, AMYLASE,  in the last 168 hours No results found for this basename: AMMONIA,  in the last 168 hours CBC:  Recent Labs Lab 12/29/13 0953 12/31/13 1835 01/02/14 0555  WBC  --  13.9* 7.2  NEUTROABS  --  9.6*  --   HGB 11.2* 8.4* 7.7*  HCT 33.0* 23.0* 22.7*  MCV  --  94.7 99.1  PLT  --  283 218   Cardiac Enzymes: No results found for this basename: CKTOTAL, CKMB, CKMBINDEX, TROPONINI,  in the last 168 hours BNP (last 3 results) No results found for this basename:  PROBNP,  in the last 8760 hours CBG:  Recent Labs Lab 01/01/14 1640 01/01/14 2043 01/01/14 2357 01/02/14 0420 01/02/14 0736  GLUCAP 137* 163* 164* 143* 146*    Recent Results (from the past 240 hour(s))  BODY FLUID CULTURE     Status: None   Collection Time    12/24/13 11:00 AM      Result Value Ref Range Status   Specimen Description PLEURAL FLUID LEFT   Final   Special Requests 60ML FLUID   Final   Gram Stain     Final   Value: WBC PRESENT,BOTH PMN AND MONONUCLEAR     NO ORGANISMS SEEN     Performed at [facility]   Culture     Final   Value: NO GROWTH 3 DAYS     Performed at [facility]   Report Status 12/28/2013 FINAL   Final      Studies: Dg Chest 2 View  12/31/2013   CLINICAL DATA:  Pneumonia  EXAM: CHEST  2 VIEW  COMPARISON:  CT CHEST W/O CM dated 12/24/2013; DG CHEST 1V dated 12/14/2013  FINDINGS: Mild cardiac enlargement. Aortic calcification. Calcified granuloma right lower lobe. Right lung otherwise clear except for mild scarring or atelectasis at the base.  On the left there is a moderate effusion with underlying lower lobe consolidation. This appears similar to CT scan performed 12/24/2013.  IMPRESSION: No significant change from recent CT scan with moderate left effusion and associated consolidated left lung.   Electronically Signed   By: Skipper Cliche M.D.   On: 12/31/2013 18:58   US Abdomen Limited Ruq  01/01/2014   CLINICAL DATA:  Clinical diagnosis of hepatic cirrhosis  EXAM: US ABDOMEN LIMITED - RIGHT UPPER QUADRANT  COMPARISON:  CT CHEST W/O CM dated 12/24/2013  FINDINGS: Gallbladder:  No gallstones or wall thickening visualized. No sonographic Murphy sign noted.  Common bile duct:  Diameter: 5 mm  Liver:  The echotexture of the liver appears normal. Portal venous flow is normal in direction toward the liver. There is no intrahepatic ductal dilation. The surface of the visualized portions of the liver appears normal. The left hepatic lobe was obscured by bowel gas.  IMPRESSION: There is no objective evidence of hepatic cirrhosis on this study. No acute abnormality of the gallbladder or common bile duct is demonstrated either.   Electronically Signed   By: David  Martinique   On: 01/01/2014 14:43    Scheduled Meds: . albuterol  2.5 mg Nebulization QID  . carvedilol  6.25 mg Oral BID WC  . cilostazol  50 mg Oral BID  . famotidine  10 mg Oral Daily  . insulin aspart  0-9 Units Subcutaneous 6 times per day  . loratadine  10 mg Oral Daily  . metoprolol  2.5 mg Intravenous Once  . piperacillin-tazobactam (ZOSYN)  IV  3.375 g Intravenous Q8H  . simvastatin  40 mg Oral QPM  . tiotropium  18 mcg Inhalation Daily  .  vancomycin  500 mg Intravenous Q12H   Continuous Infusions: . dextrose 5 % and 0.45 % NaCl with KCl 20 mEq/L 50 mL/hr at 01/01/14 1538     Time spent: 35 minutes    [facility] Pager [phone] If 7PM-7AM, please contact night-coverage at [email or web address], password [facility] 01/02/2014, 8:41 AM  LOS: 2 days

## 2014-01-02 NOTE — Progress Notes (Signed)
INITIAL NUTRITION ASSESSMENT  Patient meets the criteria for severe MALNUTRITION in the context of chronic illness with 8% weight loss in 2-3 months and severe subcutaneous fat and muscle wasting.    DOCUMENTATION CODES Per approved criteria  -Severe malnutrition in the context of chronic illness   INTERVENTION: - Ensure Pudding TID, each provides 170 kcal, 4 g protein - Encourage adequate intake of meals.  - Diet per SLP  NUTRITION DIAGNOSIS: Predicted suboptimal oral intake related to decreased appetite as evidenced by recent history of poor intake.   Goal: Patient will meet >/=90% of estimated nutrition needs  Monitor:  PO intake, swallowing ability, weights, labs  Reason for Assessment: Consult  80 y.o. male  Admitting Dx: HCAP  ASSESSMENT: 80 year old male with history of diabetes, hypertension, GERD, TIA, admitted with recurrent aspiration PNA. Has not had a bowel bowl movement in the past 3 days.  - Patient with anemia without bloody stools, but hemoccult-positive.  -No family present in the room at the time of visit. Per patient, PO intake was poor prior to admission. However, he is eating better, eating most of his lunch.  -He has a history of dysphagia, and had swallow study done previously (3/30) showing evidence of aspiration per family and was supposed to be on thickened liquids but it was too expensive and family did not purchase it. SLP evaluation, recommends Dysphagia 3, nectar thick liquids.  - Patient has lost 8% of his usual weight in 2.5 months and 19% over 1 year with moderate to severe subcutaneous fat and muscle wasting.   Nutrition Focused Physical Exam:  Subcutaneous Fat:  Orbital Region: severe wasting Upper Arm Region: moderate wasting Thoracic and Lumbar Region: moderate wasting  Muscle:  Temple Region: severe wasting Clavicle Bone Region: severe wasting Clavicle and Acromion Bone Region: moderate wasting Scapular Bone Region: severe  wasting Dorsal Hand: moderate wasting Patellar Region: severe wasting Anterior Thigh Region: severe wasting Posterior Calf Region: severe wasting  Edema: WNL  Height: Ht Readings from Last 1 Encounters:  12/31/13 5\' 4"  (1.626 m)  Per previous notes, height is 70 inches.   Weight: Wt Readings from Last 1 Encounters:  12/31/13 142 lb 3.2 oz (64.5 kg)    Ideal Body Weight: 166 pounds  % Ideal Body Weight: 86%  Wt Readings from Last 10 Encounters:  12/31/13 142 lb 3.2 oz (64.5 kg)  12/29/13 144 lb (65.318 kg)  12/17/13 145 lb (65.772 kg)  10/23/13 155 lb (70.308 kg)  07/06/13 173 lb 4.5 oz (78.6 kg)  11/05/12 176 lb (79.833 kg)  11/05/12 176 lb (79.833 kg)    Usual Body Weight: 155 pounds  % Usual Body Weight: 92%  BMI:  Body mass index is 20.37 kg/(m^2). Patient is normal weight.   Estimated Nutritional Needs: Kcal: 1750-1900 kcal Protein: 80-90 g Fluid: >1.9 L/day  Skin: Incision, left ankle  Diet Order: Dysphagia  EDUCATION NEEDS: -No education needs identified at this time   Intake/Output Summary (Last 24 hours) at 01/02/14 1534 Last data filed at 01/02/14 1500  Gross per 24 hour  Intake 2121.67 ml  Output   1290 ml  Net 831.67 ml    Last BM: 3 days PTA   Labs:   Recent Labs Lab 12/29/13 0953 12/31/13 1835 01/01/14 0012 01/01/14 0557  NA 139 134*  --  137  K 4.5 5.3  --  4.3  CL 101 99  --  100  CO2  --  24  --  27  BUN 26* 40*  --  36*  CREATININE 0.90 0.74  --  0.83  CALCIUM  --  8.8  --  9.0  MG  --   --  1.7  --   GLUCOSE 173* 169*  --  157*    CBG (last 3)   Recent Labs  01/02/14 0420 01/02/14 0736 01/02/14 1206  GLUCAP 143* 146* 199*    Scheduled Meds: . albuterol  2.5 mg Nebulization QID  . carvedilol  6.25 mg Oral BID WC  . cilostazol  50 mg Oral BID  . famotidine  10 mg Oral Daily  . insulin aspart  0-9 Units Subcutaneous 6 times per day  . loratadine  10 mg Oral Daily  . metoprolol  2.5 mg Intravenous Once  .  piperacillin-tazobactam (ZOSYN)  IV  3.375 g Intravenous Q8H  . simvastatin  40 mg Oral QPM  . tiotropium  18 mcg Inhalation Daily  . vancomycin  500 mg Intravenous Q12H    Continuous Infusions: . dextrose 5 % and 0.45 % NaCl with KCl 20 mEq/L 50 mL/hr at 01/02/14 1330    Past Medical History  Diagnosis Date  . Diabetes mellitus without complication   . Dysrhythmia     atrial fibrilation  . Hypertension   . Shortness of breath   . Peripheral vascular disease   . Claudication of calf muscles 11/05/2012    right calf  . GERD (gastroesophageal reflux disease)   . Arthritis   . TIA (transient ischemic attack) 2010    Past Surgical History  Procedure Laterality Date  . Foot surgery      foot surgery due to broken foot years ago  . Esophagogastroduodenoscopy N/A 11/02/2013    Procedure: ESOPHAGOGASTRODUODENOSCOPY (EGD);  Surgeon: Cleotis Nipper, MD;  Location: [facility] ENDOSCOPY;  Service: Endoscopy;  Laterality: N/A;    Larey Seat, RD, LDN Pager #: [phone] After-Hours Pager #: [phone]

## 2014-01-02 NOTE — Progress Notes (Signed)
Echocardiogram 2D Echocardiogram has been performed.  Doyle Askew 01/02/2014, 12:00 PM

## 2014-01-03 ENCOUNTER — Inpatient Hospital Stay (HOSPITAL_COMMUNITY): Payer: Medicare Other

## 2014-01-03 DIAGNOSIS — E43 Unspecified severe protein-calorie malnutrition: Secondary | ICD-10-CM | POA: Insufficient documentation

## 2014-01-03 DIAGNOSIS — J90 Pleural effusion, not elsewhere classified: Secondary | ICD-10-CM

## 2014-01-03 LAB — EXPECTORATED SPUTUM ASSESSMENT W REFEX TO RESP CULTURE

## 2014-01-03 LAB — URINE CULTURE
COLONY COUNT: NO GROWTH
CULTURE: NO GROWTH

## 2014-01-03 LAB — ALBUMIN, FLUID (OTHER): Albumin, Fluid: 1.7 g/dL

## 2014-01-03 LAB — CBC
HCT: 25 % — ABNORMAL LOW (ref 39.0–52.0)
HEMOGLOBIN: 8.4 g/dL — AB (ref 13.0–17.0)
MCH: 33.9 pg (ref 26.0–34.0)
MCHC: 33.6 g/dL (ref 30.0–36.0)
MCV: 100.8 fL — ABNORMAL HIGH (ref 78.0–100.0)
Platelets: 254 10*3/uL (ref 150–400)
RBC: 2.48 MIL/uL — ABNORMAL LOW (ref 4.22–5.81)
RDW: 13.3 % (ref 11.5–15.5)
WBC: 6.9 10*3/uL (ref 4.0–10.5)

## 2014-01-03 LAB — CREATININE, SERUM
Creatinine, Ser: 0.78 mg/dL (ref 0.50–1.35)
GFR calc Af Amer: >90 mL/min (ref >90)
GFR calc non Af Amer: 81 mL/min — ABNORMAL LOW (ref >90)

## 2014-01-03 LAB — LACTATE DEHYDROGENASE, PLEURAL OR PERITONEAL FLUID: LD FL: 70 U/L — AB (ref 3–23)

## 2014-01-03 LAB — GLUCOSE, CAPILLARY
GLUCOSE-CAPILLARY: 193 mg/dL — AB (ref 70–99)
GLUCOSE-CAPILLARY: 268 mg/dL — AB (ref 70–99)
Glucose-Capillary: 232 mg/dL — ABNORMAL HIGH (ref 70–99)
Glucose-Capillary: 190 mg/dL — ABNORMAL HIGH (ref 70–99)

## 2014-01-03 LAB — GLUCOSE, SEROUS FLUID: Glucose, Fluid: 233 mg/dL

## 2014-01-03 LAB — VANCOMYCIN, TROUGH: VANCOMYCIN TR: 10.3 ug/mL (ref 10.0–20.0)

## 2014-01-03 MED ORDER — VANCOMYCIN HCL IN DEXTROSE 750-5 MG/150ML-% IV SOLN
750.0000 mg | Freq: Two times a day (BID) | INTRAVENOUS | Status: DC
  Administered 2014-01-03 – 2014-01-05 (×5): 750 mg via INTRAVENOUS
  Filled 2014-01-03 (×7): qty 150

## 2014-01-03 MED ORDER — INSULIN ASPART 100 UNIT/ML ~~LOC~~ SOLN
0.0000 [IU] | Freq: Three times a day (TID) | SUBCUTANEOUS | Status: DC
  Administered 2014-01-03 (×2): 2 [IU] via SUBCUTANEOUS
  Administered 2014-01-03: 3 [IU] via SUBCUTANEOUS
  Administered 2014-01-04: 2 [IU] via SUBCUTANEOUS
  Administered 2014-01-04: 5 [IU] via SUBCUTANEOUS
  Administered 2014-01-04 – 2014-01-05 (×3): 2 [IU] via SUBCUTANEOUS
  Administered 2014-01-05: 3 [IU] via SUBCUTANEOUS
  Administered 2014-01-06 (×2): 2 [IU] via SUBCUTANEOUS

## 2014-01-03 NOTE — Progress Notes (Signed)
Physical Therapy Treatment Patient Details Name: [name] MRN: [id number] DOB: Oct 17, 1927 Today's Date: 01/03/2014    History of Present Illness Family states that he woke up last with sever leg pain he was sent to [facility] ER and was set home with muscle spasm. Family states he could not walk since then. Today he started to have shortness of breath and presented to ER he was found to have likely PNA.  He had a low grade fever up to 99.9 today. CT of the chest was done 3/31 and showed high-density particular matter within the collapsed left lower lobe, suggesting chronic aspiration. He had swallow study done showing evidence of aspiration     PT Comments    *Pt notes new weakness and numbness in RLE. R Knee extension strength 2/5, sensation to light touch is intact but diminished. Pt has significant decline in functional mobility. At baseline he walks with a hemiwalker independently. Presently he requires total assist to pivot bed to chair and is high fall risk. RLE buckled during stand pivot transfer. SNF recommended. *  Follow Up Recommendations  SNF     Equipment Recommendations  None recommended by PT    Recommendations for Other Services       Precautions / Restrictions Precautions Precautions: Fall Precaution Comments: h/o L hemiparesis, RLE also weak, RLE buckled during transfer Restrictions Weight Bearing Restrictions: No    Mobility  Bed Mobility Overal bed mobility: Needs Assistance Bed Mobility: Supine to Sit;Sit to Supine     Supine to sit: Mod assist     General bed mobility comments: Assist to raise trunk, pt used RUE to pull up, assist to pivot hips and to support/advance LLE  Transfers Overall transfer level: Needs assistance Equipment used: Hemi-walker Transfers: Sit to/from [facility] Sit to Stand: Mod assist Stand pivot transfers: Total assist;Mod assist       General transfer comment: pt able to initiate pivot but then RLE  buckled requiring total assist to prevent fall, pt used hemiwalker on R  Ambulation/Gait                 Stairs            Wheelchair Mobility    Modified Rankin (Stroke Patients Only)       Balance Overall balance assessment: Needs assistance Sitting-balance support: No upper extremity supported;Feet supported Sitting balance-Leahy Scale: Good       Standing balance-Leahy Scale: Poor                      Cognition Arousal/Alertness: Awake/alert Behavior During Therapy: WFL for tasks assessed/performed Overall Cognitive Status: Within Functional Limits for tasks assessed                      Exercises      General Comments        Pertinent Vitals/Pain *0/10 pain SaO2 92% on RA at rest, 90% on RA with activity HR 112 with activity**    Home Living                      Prior Function            PT Goals (current goals can now be found in the care plan section) Acute Rehab PT Goals Patient Stated Goal: go to rehab PT Goal Formulation: With patient/family Time For Goal Achievement: 01/15/14 Potential to Achieve Goals: Good Progress towards PT goals: Progressing toward goals  Frequency  Min 3X/week    PT Plan Current plan remains appropriate    Co-evaluation             End of Session Equipment Utilized During Treatment: Gait belt Activity Tolerance: Patient limited by fatigue Patient left: with call bell/phone within reach;in chair;with family/visitor present     Time: 1610-9604 PT Time Calculation (min): 33 min  Charges:  $Therapeutic Activity: 23-37 mins                    G Codes:      [name] 01/03/2014, 9:56 AM [phone]

## 2014-01-03 NOTE — Clinical Documentation Improvement (Signed)
  Per 4/12 Registered Dietician eval: "Patient meets the criteria for severe MALNUTRITION in the context of chronic illness with 8% weight loss in 2-3 months and severe subcutaneous fat and muscle wasting." Please clarify "malnutrition" found in Notes. Thank you.  Possible Clinical Conditions? - Severe Protein Calorie Malnutrition - Cachexia   - Other Condition  Subcutaneous Fat:  Orbital Region: severe wasting  Upper Arm Region: moderate wasting  Thoracic and Lumbar Region: moderate wasting  Muscle:  Temple Region: severe wasting  Clavicle Bone Region: severe wasting  Clavicle and Acromion Bone Region: moderate wasting  Scapular Bone Region: severe wasting  Dorsal Hand: moderate wasting  Patellar Region: severe wasting  Anterior Thigh Region: severe wasting  Posterior Calf Region: severe wasting  Thank You, Ezekiel Ina ,RN Clinical Documentation Specialist:  [phone]  [place] Information Management

## 2014-01-03 NOTE — Progress Notes (Signed)
OT Cancellation Note  Patient Details Name: [name] MRN: [id number] DOB: [birth year]   Cancelled Treatment:    Reason Eval/Treat Not Completed: Patient at procedure or test/ unavailable. Will recheck on pt next day. Thanks,  Betsy Pries 01/03/2014, 4:33 PM

## 2014-01-03 NOTE — Progress Notes (Signed)
Hemoglobin is slightly improved compared to yesterday, but still about 4 g less than what it was several weeks ago, suggesting he has had a transient bleed in the interim. The anemia is normocytic.  Meanwhile, the patient remains under treatment for his community acquired pneumonia, and his breathing reasonably comfortably  Upper endoscopy by me 2 months ago for very similar clinical presentation was normal.  The patient's most recent colonoscopy was in February 2010, at which time he had a solitary diminutive adenomatous polyp in the setting of a family history of colon cancer.  Discussion and recommendation: I do not feel this patient should have a colonoscopy while he is in the hospital. He needs to get over his pneumonia before even considering doing a colonoscopy.   Thereafter, I could see him as an outpatient, perhaps do a set of home Hemoccults, and decide at that time whether his overall condition, his age, and his risk factors for colonic disease justify the risk of putting this gentleman through an updated colonoscopy.  The patient already has an appointment to see me on April 30 at 10:45 AM. I will reassess him at that time.  I will plan to sign off at the present time. Please call if further input from [place] during this patient's hospitalization is desired.  Cleotis Nipper, M.D. [phone]

## 2014-01-03 NOTE — Progress Notes (Signed)
[facility] PROGRESS NOTE  [name] ZWC:[id number] DOB: [birth year] DOA: 12/31/2013 PCP: [name], MD  Assessment/Plan:  PNA: Presumed to be from aspiration pneumonia. - SLP evaluated, dys 3 - continue Vanc/PipTazo, narrow to Augmentin on d/c  - Pulmonology consult for recurrent pulmonary effusions  HTN (hypertension)- Pt on B blocker and blood pressure relatively well controlled on this.  Diabetes mellitus, type 2- Continue SSI  COPD (chronic obstructive pulmonary disease)- Compensated currently continue bronchodilators  Atrial fibrillation- Rate controlled on carvedilol - Xarelto on hold secondary to anemia with positive Hemoccult   Anemia with positive Hemoccult - GI consulted and currently will follow along with [place]. - Continue holding xarelto - Hb 7.7 /12, 8.4 on 4/13  Pleural effusion, left - Will defer management recommendations to pulm  UTI (lower urinary tract infection) - Continue broad-spectrum antibiotics - Followup with urine culture  Code Status: Full code Family Communication: No family at bedside discussed directly with patient Disposition Plan: SNF when ready. Patient and wife agreeable.    Consultants:  Gastroenterologist  Pulmonology  Procedures:  None  Antibiotics:  Zosyn and vancomycin  HPI/Subjective: No new complaints patient denies any bright blood per rectum  Objective: Filed Vitals:   01/03/14 0509  BP: 104/56  Pulse: 88  Temp: 98.2 F (36.8 C)  Resp: 16    Intake/Output Summary (Last 24 hours) at 01/03/14 0833 Last data filed at 01/03/14 0740  Gross per 24 hour  Intake   2080 ml  Output   1425 ml  Net    655 ml   Filed Weights   12/31/13 1810 12/31/13 2202  Weight: 65.318 kg (144 lb) 64.5 kg (142 lb 3.2 oz)    Exam:   General:  Pt in NAD, alert and awake  Cardiovascular: normal s1 and s2, no murmurs  Respiratory: decreased breath sounds over base of L lung field, no wheezes  Abdomen: soft,  Nt, nd  Musculoskeletal: no cyanosis or clubbing   Data Reviewed: Basic Metabolic Panel:  Recent Labs Lab 12/29/13 0953 12/31/13 1835 01/01/14 0012 01/01/14 0557  NA 139 134*  --  137  K 4.5 5.3  --  4.3  CL 101 99  --  100  CO2  --  24  --  27  GLUCOSE 173* 169*  --  157*  BUN 26* 40*  --  36*  CREATININE 0.90 0.74  --  0.83  CALCIUM  --  8.8  --  9.0  MG  --   --  1.7  --    Liver Function Tests:  Recent Labs Lab 12/31/13 1835 01/01/14 0557  AST 54* 29  ALT 18 16  ALKPHOS 65 76  BILITOT 0.7 0.9  PROT 6.2 6.1  ALBUMIN 2.9* 2.8*   CBC:  Recent Labs Lab 12/29/13 0953 12/31/13 1835 01/02/14 0555  WBC  --  13.9* 7.2  NEUTROABS  --  9.6*  --   HGB 11.2* 8.4* 7.7*  HCT 33.0* 23.0* 22.7*  MCV  --  94.7 99.1  PLT  --  283 218   CBG:  Recent Labs Lab 01/02/14 0736 01/02/14 1206 01/02/14 1631 01/02/14 2025 01/03/14 0738  GLUCAP 146* 199* 220* 230* 193*    Recent Results (from the past 240 hour(s))  BODY FLUID CULTURE     Status: None   Collection Time    12/24/13 11:00 AM      Result Value Ref Range Status   Specimen Description PLEURAL FLUID LEFT   Final  Special Requests 60ML FLUID   Final   Gram Stain     Final   Value: WBC PRESENT,BOTH PMN AND MONONUCLEAR     NO ORGANISMS SEEN     Performed at [facility]   Culture     Final   Value: NO GROWTH 3 DAYS     Performed at [facility]   Report Status 12/28/2013 FINAL   Final  CULTURE, BLOOD (ROUTINE X 2)     Status: None   Collection Time    12/31/13  6:35 PM      Result Value Ref Range Status   Specimen Description BLOOD RIGHT FOREARM  4 ML IN [facility] BOTTLE   Final   Special Requests NONE   Final   Culture  Setup Time     Final   Value: 01/01/2014 01:04     Performed at [facility]   Culture     Final   Value:        BLOOD CULTURE RECEIVED NO GROWTH TO DATE CULTURE WILL BE HELD FOR 5 DAYS BEFORE ISSUING A FINAL NEGATIVE REPORT     Performed at [facility]    Report Status PENDING   Incomplete  CULTURE, BLOOD (ROUTINE X 2)     Status: None   Collection Time    12/31/13  7:21 PM      Result Value Ref Range Status   Specimen Description BLOOD RIGHT ARM  4 ML IN [facility] BOTTLE   Final   Special Requests NONE   Final   Culture  Setup Time     Final   Value: 01/01/2014 01:02     Performed at [facility]   Culture     Final   Value:        BLOOD CULTURE RECEIVED NO GROWTH TO DATE CULTURE WILL BE HELD FOR 5 DAYS BEFORE ISSUING A FINAL NEGATIVE REPORT     Performed at [facility]   Report Status PENDING   Incomplete  URINE CULTURE     Status: None   Collection Time    01/01/14  5:31 PM      Result Value Ref Range Status   Specimen Description URINE, CATHETERIZED   Final   Special Requests Immunocompromised   Final   Culture  Setup Time     Final   Value: 01/02/2014 02:12     Performed at [facility] Count     Final   Value: NO GROWTH     Performed at [facility]   Culture     Final   Value: NO GROWTH     Performed at [facility]   Report Status 01/03/2014 FINAL   Final     Studies: Dg Chest Port 1 View  01/03/2014   CLINICAL DATA:  Re-evaluate left lower lobe process.  EXAM: PORTABLE CHEST - 1 VIEW  COMPARISON:  12/31/2013.  FINDINGS: There is a persistent moderate-sized left pleural effusion overlying atelectasis. No definite pneumothorax. Left-sided rib fractures are noted. Suspect a small right pleural effusion and minimal overlying atelectasis. The cardiac silhouette, mediastinal and hilar contours are stable. Stable tortuosity and calcification of the thoracic aorta.  IMPRESSION: Persistent left effusion and overlying atelectasis.   Electronically Signed   By: Kalman Jewels M.D.   On: 01/03/2014 07:33   US Abdomen Limited Ruq  01/01/2014   CLINICAL DATA:  Clinical diagnosis of hepatic cirrhosis  EXAM: US ABDOMEN  LIMITED - RIGHT UPPER QUADRANT  COMPARISON:  CT CHEST W/O CM dated  12/24/2013  FINDINGS: Gallbladder:  No gallstones or wall thickening visualized. No sonographic Murphy sign noted.  Common bile duct:  Diameter: 5 mm  Liver:  The echotexture of the liver appears normal. Portal venous flow is normal in direction toward the liver. There is no intrahepatic ductal dilation. The surface of the visualized portions of the liver appears normal. The left hepatic lobe was obscured by bowel gas.  IMPRESSION: There is no objective evidence of hepatic cirrhosis on this study. No acute abnormality of the gallbladder or common bile duct is demonstrated either.   Electronically Signed   By: David  Martinique   On: 01/01/2014 14:43    Scheduled Meds: . albuterol  2.5 mg Nebulization QID  . carvedilol  6.25 mg Oral BID WC  . cilostazol  50 mg Oral BID  . famotidine  10 mg Oral Daily  . feeding supplement (ENSURE)  1 Container Oral TID BM  . insulin aspart  0-9 Units Subcutaneous TID WC  . loratadine  10 mg Oral Daily  . metoprolol  2.5 mg Intravenous Once  . piperacillin-tazobactam (ZOSYN)  IV  3.375 g Intravenous Q8H  . simvastatin  40 mg Oral QPM  . tiotropium  18 mcg Inhalation Daily  . vancomycin  500 mg Intravenous Q12H   Continuous Infusions: . dextrose 5 % and 0.45 % NaCl with KCl 20 mEq/L 50 mL/hr at 01/02/14 1330     Time spent: 25 minutes    [place] [facility] Pager [phone] If 7PM-7AM, please contact night-coverage at [email or web address], password [facility] 01/03/2014, 8:33 AM  LOS: 3 days

## 2014-01-03 NOTE — Procedures (Signed)
Successful US guided left thoracentesis. Yielded 1.3L of amber colored fluid. Pt tolerated procedure well. No immediate complications.  Specimen was sent for labs. CXR ordered.  Ascencion Dike PA-C 01/03/2014 3:26 PM

## 2014-01-03 NOTE — Progress Notes (Signed)
Name: [name] MRN: [id number] DOB: 03/01/1928    ADMISSION DATE:  12/31/2013 CONSULTATION DATE:  01/01/14  REFERRING MD :  [facility] PRIMARY SERVICE:  [facility]  CHIEF COMPLAINT:  Effusion, PNA  BRIEF PATIENT DESCRIPTION: 49 COPD (MR office) dysphagia aspiration, SOB    [name] is a 80 y.o. male dysphagia / TIA and have had recurrent episodes of PNA treated as out patient by his PCP this was followed by noted pleural effusion that was tapped on 4/3/1 showing reactive mesothelial cells 1.2 liters after CT demonstrated this effusion and LLL / atx/ PNA and concern aspiration in left lower lobe. Admitted now 4/8 leg pain then SOB. Pcxr taken reveals about same effusion left base on this admission. Consulted now for concern need bronch and effusion noted. No distress, no hemoptysis.  SIGNIFICANT EVENTS / STUDIES:  4/3 Ct chest - The left pleural effusion has not significantly changed in volume (THIS WAS DONE POST THORA) compared with the CT performed approximately 2 months ago.Progressive left lower lobe collapse without clear etiology. There is high-density particular matter within the collapsed left ower lobe, suggesting chronic aspiration. Depending on the results of thoracentesis, bronchoscopy may be warranted to exclude a centrally obstructing lesion. Increased patchy airspace disease at the right lung base,possibly atelectasis or aspiration. Several subacute left-sided rib fractures again noted, without significant change.Extensive atherosclerosis.  4/3 - US guided thora 1.2 liters, 632 WBC 56 %lymph, prot ratio estimate 0.64 = exudative, no ldh  CULTURES: 4/10 sputum>>> 4/10 BC>>>  ANTIBIOTICS: 4/8 zosyn>>> 4/8 vanc>>>  SUBJECTIVE:   4/13: deconditioned, denies complaints  VITAL SIGNS: Temp:  [97.5 F (36.4 C)-98.2 F (36.8 C)] 98.2 F (36.8 C) (04/13 0509) Pulse Rate:  [73-94] 88 (04/13 0509) Resp:  [16-20] 16 (04/13 0509) BP: (104-115)/(55-68) 104/56 mmHg  (04/13 0509) SpO2:  [96 %-100 %] 100 % (04/13 0509)  PHYSICAL EXAMINATION: General:  No distress Neuro:  Nonfocal, weak diffuse, perrl HEENT:  jvd wnl Neck:  No stridor, secretions sounds with cough Cardiovascular:  s1 s2 RRR Lungs:  ronchi left base Abdomen:  Soft, bs wnl, no r Skin:  bruising from IV left arm   PULMONARY  Recent Labs Lab 12/29/13 0953  TCO2 25    CBC  Recent Labs Lab 12/29/13 0953 12/31/13 1835 01/02/14 0555  HGB 11.2* 8.4* 7.7*  HCT 33.0* 23.0* 22.7*  WBC  --  13.9* 7.2  PLT  --  283 218    COAGULATION  Recent Labs Lab 01/02/14 0555  INR 1.21    CARDIAC  No results found for this basename: TROPONINI,  in the last 168 hours No results found for this basename: PROBNP,  in the last 168 hours   CHEMISTRY  Recent Labs Lab 12/29/13 0953 12/31/13 1835 01/01/14 0012 01/01/14 0557  NA 139 134*  --  137  K 4.5 5.3  --  4.3  CL 101 99  --  100  CO2  --  24  --  27  GLUCOSE 173* 169*  --  157*  BUN 26* 40*  --  36*  CREATININE 0.90 0.74  --  0.83  CALCIUM  --  8.8  --  9.0  MG  --   --  1.7  --    Estimated Creatinine Clearance: 56.5 ml/min (by C-G formula based on Cr of 0.83).   LIVER  Recent Labs Lab 12/31/13 1835 01/01/14 0557 01/02/14 0555  AST 54* 29  --   ALT 18 16  --  ALKPHOS 65 76  --   BILITOT 0.7 0.9  --   PROT 6.2 6.1  --   ALBUMIN 2.9* 2.8*  --   INR  --   --  1.21     INFECTIOUS  Recent Labs Lab 12/31/13 1842 01/02/14 0907  LATICACIDVEN 1.23  --   PROCALCITON  --  <0.10     ENDOCRINE CBG (last 3)   Recent Labs  01/02/14 1631 01/02/14 2025 01/03/14 0738  GLUCAP 220* 230* 193*         IMAGING x48h  Dg Chest Port 1 View  01/03/2014   CLINICAL DATA:  Re-evaluate left lower lobe process.  EXAM: PORTABLE CHEST - 1 VIEW  COMPARISON:  12/31/2013.  FINDINGS: There is a persistent moderate-sized left pleural effusion overlying atelectasis. No definite pneumothorax. Left-sided rib fractures  are noted. Suspect a small right pleural effusion and minimal overlying atelectasis. The cardiac silhouette, mediastinal and hilar contours are stable. Stable tortuosity and calcification of the thoracic aorta.  IMPRESSION: Persistent left effusion and overlying atelectasis.   Electronically Signed   By: Kalman Jewels M.D.   On: 01/03/2014 07:33   US Abdomen Limited Ruq  01/01/2014   CLINICAL DATA:  Clinical diagnosis of hepatic cirrhosis  EXAM: US ABDOMEN LIMITED - RIGHT UPPER QUADRANT  COMPARISON:  CT CHEST W/O CM dated 12/24/2013  FINDINGS: Gallbladder:  No gallstones or wall thickening visualized. No sonographic Murphy sign noted.  Common bile duct:  Diameter: 5 mm  Liver:  The echotexture of the liver appears normal. Portal venous flow is normal in direction toward the liver. There is no intrahepatic ductal dilation. The surface of the visualized portions of the liver appears normal. The left hepatic lobe was obscured by bowel gas.  IMPRESSION: There is no objective evidence of hepatic cirrhosis on this study. No acute abnormality of the gallbladder or common bile duct is demonstrated either.   Electronically Signed   By: David  Martinique   On: 01/01/2014 14:43      ASSESSMENT / PLAN:  Dysphagia  recurrent Likely exudative effusion likely secondary to left lower lobe atx from chronic aspiration Aspiration PNA with noncompliance diet COPD    - still very deconditioned. PERsistent left pleural effusion;likely non diagnostic exudate. Failed swallow on D3 diet per [facility]  PLAN  --we need to get him compliant with his clear aspiration events and move the secretions in Left lower lobe in order to aerate this left lower lobe and improve associated effusion -no role bronch at this stage unless after the above plan no improvements, then would consider bronch assessment for endobronchial lesion -strict asp diet -chest pt to move secretions, flutter, IS - REPEAT THORA (risks, benefits, and limitations  explained tio him and he verbalizes understanding and agrees to proceed)  D.w Dr Letta Median   Dr. Brand Males, M.D., [facility].C.P Pulmonary and Critical Care Medicine Staff Physician [place] [facility] Pager: [phone], If no answer or between  15:00h - 7:00h: call [phone]  01/03/2014 11:22 AM

## 2014-01-03 NOTE — Progress Notes (Signed)
Inpatient Diabetes Program Recommendations  AACE/ADA: New Consensus Statement on Inpatient Glycemic Control (2013)  Target Ranges:  Prepandial:   less than 140 mg/dL      Peak postprandial:   less than 180 mg/dL (1-2 hours)      Critically ill patients:  140 - 180 mg/dL   Reason for Visit: Results for WILBER, FINI (MRN [id number]) as of 01/03/2014 10:09  Ref. Range 01/02/2014 07:36 01/02/2014 12:06 01/02/2014 16:31 01/02/2014 20:25 01/03/2014 07:38  Glucose-Capillary Latest Range: 70-99 mg/dL 146 (H) 199 (H) 220 (H) 230 (H) 193 (H)   Diabetes history: Type 2 diabetes Outpatient Diabetes medications: Metformin 500 mg bid Current orders for Inpatient glycemic control: Novolog sensitive tid with meals.  Consider adding Levemir 8 units daily while patient is in the hospital.  Adah Perl, RN, BC-ADM Inpatient Diabetes Coordinator Pager [phone]

## 2014-01-03 NOTE — Progress Notes (Signed)
ANTIBIOTIC CONSULT NOTE - FOLLOW UP  Pharmacy Consult for Vancomycin, antibiotic renal dose adjustment (Zosyn) Indication: pneumonia  No Known Allergies  Patient Measurements: Height: 5\' 4"  (162.6 cm) Weight: 142 lb 3.2 oz (64.5 kg) IBW/kg (Calculated) : 59.2  Vital Signs: Temp: 98.2 F (36.8 C) (04/13 0509) Temp src: Oral (04/13 0509) BP: 104/56 mmHg (04/13 0509) Pulse Rate: 112 (04/13 0951) Intake/Output from previous day: 04/12 0701 - 04/13 0700 In: 2080 [P.O.:680; I.V.:1150; IV Piggyback:250] Out: 1025 [Urine:1025] Intake/Output from this shift: Total I/O In: 320 [P.O.:320] Out: 400 [Urine:400]  Labs:  Recent Labs  12/31/13 1835 01/01/14 0557 01/02/14 0555 01/03/14 0945  WBC 13.9*  --  7.2  --   HGB 8.4*  --  7.7*  --   PLT 283  --  218  --   CREATININE 0.74 0.83  --  0.78   Estimated Creatinine Clearance: 58.6 ml/min (by C-G formula based on Cr of 0.78).  Recent Labs  01/03/14 0945  [place] 10.3    Microbiology: Recent Results (from the past 720 hour(s))  BODY FLUID CULTURE     Status: None   Collection Time    12/24/13 11:00 AM      Result Value Ref Range Status   Specimen Description PLEURAL FLUID LEFT   Final   Special Requests 60ML FLUID   Final   Gram Stain     Final   Value: WBC PRESENT,BOTH PMN AND MONONUCLEAR     NO ORGANISMS SEEN     Performed at [facility]   Culture     Final   Value: NO GROWTH 3 DAYS     Performed at [facility]   Report Status 12/28/2013 FINAL   Final  CULTURE, BLOOD (ROUTINE X 2)     Status: None   Collection Time    12/31/13  6:35 PM      Result Value Ref Range Status   Specimen Description BLOOD RIGHT FOREARM  4 ML IN [facility] BOTTLE   Final   Special Requests NONE   Final   Culture  Setup Time     Final   Value: 01/01/2014 01:04     Performed at [facility]   Culture     Final   Value:        BLOOD CULTURE RECEIVED NO GROWTH TO DATE CULTURE WILL BE HELD FOR 5 DAYS BEFORE ISSUING  A FINAL NEGATIVE REPORT     Performed at [facility]   Report Status PENDING   Incomplete  CULTURE, BLOOD (ROUTINE X 2)     Status: None   Collection Time    12/31/13  7:21 PM      Result Value Ref Range Status   Specimen Description BLOOD RIGHT ARM  4 ML IN [facility] BOTTLE   Final   Special Requests NONE   Final   Culture  Setup Time     Final   Value: 01/01/2014 01:02     Performed at [facility]   Culture     Final   Value:        BLOOD CULTURE RECEIVED NO GROWTH TO DATE CULTURE WILL BE HELD FOR 5 DAYS BEFORE ISSUING A FINAL NEGATIVE REPORT     Performed at [facility]   Report Status PENDING   Incomplete  URINE CULTURE     Status: None   Collection Time    01/01/14  5:31 PM      Result Value  Ref Range Status   Specimen Description URINE, CATHETERIZED   Final   Special Requests Immunocompromised   Final   Culture  Setup Time     Final   Value: 01/02/2014 02:12     Performed at [facility] Count     Final   Value: NO GROWTH     Performed at [facility]   Culture     Final   Value: NO GROWTH     Performed at [facility]   Report Status 01/03/2014 FINAL   Final   Anti-infectives: 4/10 >>Vanc >> 4/10 >>Zosyn >>    Assessment: 77 yoM admitted 4/10 with fever, SOB, productive cough and concern for suspected aspiration pneumonia.  PMH significant for treatment for pneumonia with recent hospitalization and COPD.  Pharmacy is consulted to dose vancomycin and renally adjust antibiotic doses as needed.  Day # 4 Vancomycin and Zosyn  Tmax: AF  WBCs: improved to wnl  Renal: Scr 0.78 (stable), CrCl 56 ml/min CG  Vancomycin trough level = 10.3, below goal range   Goal of Therapy:  Vancomycin trough level 15-20 mcg/ml  Plan:   Continue Zosyn 3.375g IV Q8H infused over 4hrs.  Increase to Vancomycin 750mg  IV q12h.  Measure Vanc trough at steady state.  Follow up renal fxn and culture results.   Gretta Arab PharmD, BCPS Pager [phone] 01/03/2014 10:27 AM

## 2014-01-03 NOTE — Clinical Social Work Psychosocial (Signed)
     Clinical Social Work Department BRIEF PSYCHOSOCIAL ASSESSMENT 01/03/2014  Patient:  [name], [name]     Account Number:  [id number]     [place] date:  12/31/2013  Clinical Social Worker:  Venia Minks  Date/Time:  01/03/2014 12:00 M  Referred by:  Physician  Date Referred:  01/03/2014 Referred for  SNF Placement   Other Referral:   Interview type:  Patient Other interview type:    PSYCHOSOCIAL DATA Living Status:  FAMILY Admitted from facility:   Level of care:   Primary support name:  mable dick Primary support relationship to patient:  SPOUSE Degree of support available:   good    CURRENT CONCERNS Current Concerns  Post-Acute Placement   Other Concerns:    SOCIAL WORK ASSESSMENT / PLAN CSW met with patient. patient is alert and oriented X3. discussed need for snf placement. Patient states that he has previously been to [facility] [place]. He states that he might possibly go back there but is interested in other facilities as well. Patient reports that he lives with his girlfreind of 43 years. he states that they never got married but have been together for very long. he states that she is very supportive of him.   Assessment/plan status:   Other assessment/ plan:   Information/referral to community resources:    PATIENTS/FAMILYS RESPONSE TO PLAN OF CARE: Patient is agreeable to his need for rehab and is hopeful that he will only need rehab for a short time. he is agreeable to being faxed out and recieving bed offers.

## 2014-01-03 NOTE — Care Management Note (Addendum)
    Page 1 of 2   01/06/2014     4:54:59 PM   CARE MANAGEMENT NOTE 01/06/2014  Patient:  [name], [name]   Account Number:  [id number]  Date Initiated:  01/02/2014  Documentation initiated by:  [facility]  Subjective/Objective Assessment:   aspiration pneumonia     Action/Plan:   [facility] active   Anticipated DC Date:  01/05/2014   Anticipated DC Plan:  SKILLED NURSING FACILITY  In-house referral  Clinical Social Worker      DC Planning Services  CM consult      [facility] Choice  Resumption Of Svcs/PTA Provider   Choice offered to / List presented to:             Status of service:  Completed, signed off Medicare Important Message given?  NA - LOS <3 / Initial given by admissions (If response is "NO", the following Medicare IM given date fields will be blank) Date Medicare IM given:   Date Additional Medicare IM given:    Discharge Disposition:  [place]  Per UR Regulation:  Reviewed for med. necessity/level of care/duration of stay  If discussed at [place] of Stay Meetings, dates discussed:   01/06/2014    Comments:  01/06/14 [facility] RN,BSN NCM [phone] D/C SNF.  01/04/14 Antiono Ettinger RN,BSN NCM [phone] PT/OT-SNF.  01/02/2014 1420 Notified [facility] of admission. Jonnie Finner RN CCM Case Mgmt phone [phone]    See ED note.

## 2014-01-04 ENCOUNTER — Inpatient Hospital Stay (HOSPITAL_COMMUNITY): Payer: Medicare Other

## 2014-01-04 LAB — GLUCOSE, CAPILLARY
GLUCOSE-CAPILLARY: 182 mg/dL — AB (ref 70–99)
Glucose-Capillary: 170 mg/dL — ABNORMAL HIGH (ref 70–99)
Glucose-Capillary: 254 mg/dL — ABNORMAL HIGH (ref 70–99)
Glucose-Capillary: 191 mg/dL — ABNORMAL HIGH (ref 70–99)

## 2014-01-04 LAB — CBC
HCT: 24 % — ABNORMAL LOW (ref 39.0–52.0)
Hemoglobin: 7.8 g/dL — ABNORMAL LOW (ref 13.0–17.0)
MCH: 32.9 pg (ref 26.0–34.0)
MCHC: 32.5 g/dL (ref 30.0–36.0)
MCV: 101.3 fL — AB (ref 78.0–100.0)
Platelets: 224 10*3/uL (ref 150–400)
RBC: 2.37 MIL/uL — ABNORMAL LOW (ref 4.22–5.81)
RDW: 13.4 % (ref 11.5–15.5)
WBC: 6.8 10*3/uL (ref 4.0–10.5)

## 2014-01-04 LAB — BASIC METABOLIC PANEL
BUN: 27 mg/dL — ABNORMAL HIGH (ref 6–23)
CO2: 26 mEq/L (ref 19–32)
Calcium: 8.2 mg/dL — ABNORMAL LOW (ref 8.4–10.5)
Chloride: 103 mEq/L (ref 96–112)
Creatinine, Ser: 0.75 mg/dL (ref 0.50–1.35)
GFR calc Af Amer: >90 mL/min (ref >90)
GFR, EST NON AFRICAN AMERICAN: 83 mL/min — AB (ref >90)
Glucose, Bld: 230 mg/dL — ABNORMAL HIGH (ref 70–99)
POTASSIUM: 4.4 meq/L (ref 3.7–5.3)
SODIUM: 138 meq/L (ref 137–147)

## 2014-01-04 LAB — PROCALCITONIN: Procalcitonin: <0.1 ng/mL

## 2014-01-04 LAB — ALBUMIN: ALBUMIN: 2.2 g/dL — AB (ref 3.5–5.2)

## 2014-01-04 LAB — LACTATE DEHYDROGENASE: LDH: 152 U/L (ref 94–250)

## 2014-01-04 NOTE — Progress Notes (Signed)
CSW went to provide patient with bed offers. Patient out of room for a test. CSW to come back later.  Jatoria Kneeland C. [place] MSW, [place]

## 2014-01-04 NOTE — Evaluation (Signed)
Occupational Therapy Evaluation Patient Details Name: [name] MRN: [id number] DOB: 1928-09-09 Today's Date: 01/04/2014    History of Present Illness Family states that he woke up last with sever leg pain he was sent to [facility] ER and was set home with muscle spasm. Family states he could not walk since then. Today he started to have shortness of breath and presented to ER he was found to have likely PNA.  He had a low grade fever up to 99.9 today. CT of the chest was done 3/31 and showed high-density particular matter within the collapsed left lower lobe, suggesting chronic aspiration. He had swallow study done showing evidence of aspiration    Clinical Impression   Patient required only minimal assistance for ADL prior to recent hospitalization.  Patient currently requires total assistance, and at times two caregivers.  Patient decondtioned, and now with decreased functional use and numbness right LE.  Patient with decreased motor control in RLE, affecting all functional mobility and LB bathing, dressing, toileting.      Follow Up Recommendations  SNF    Equipment Recommendations  Other (comment) (to be determined in SNF)    Recommendations for Other Services       Precautions / Restrictions Precautions Precautions: Fall Precaution Comments: h/o L hemiparesis now with decreased coordination and numbness in RLE Restrictions Weight Bearing Restrictions: No      Mobility Bed Mobility Overal bed mobility: Needs Assistance Bed Mobility: Supine to Sit     Supine to sit: Mod assist Sit to supine: Min assist   General bed mobility comments: Increased anxiety with idea of moving into seated position from supine  Transfers Overall transfer level: Needs assistance Equipment used: Hemi-walker Transfers: Sit to/from Stand Sit to Stand: Max assist              Balance Overall balance assessment: Needs assistance Sitting-balance support: No upper extremity supported;Feet  supported Sitting balance-Leahy Scale: Good       Standing balance-Leahy Scale: Zero                              ADL Overall ADL's : Needs assistance/impaired   Eating/Feeding Details (indicate cue type and reason): Now D3 with nectar Grooming: Wash/dry hands;Wash/dry face;Applying deodorant;Brushing hair;Moderate assistance;Sitting   Upper Body Bathing: Moderate assistance;Sitting   Lower Body Bathing: Total assistance;+2 for physical assistance;Sit to/from stand   Upper Body Dressing : Moderate assistance;Sitting   Lower Body Dressing: Total assistance;+2 for physical assistance;Sit to/from stand   Toilet Transfer: +2 for safety/equipment;Total assistance;+2 for physical assistance Toilet Transfer Details (indicate cue type and reason): Limited recruitment of RLE for transfers           General ADL Comments: Patient requires total assist due to severely limited use of right LE, Previously his strong leg.  "Wife" assisted only minimally with ADL's prior     Vision                     Perception Perception Perception Tested?: No   Praxis Praxis Praxis tested?: Not tested    Pertinent Vitals/Pain VSS, denies pain     Hand Dominance Right   Extremity/Trunk Assessment Upper Extremity Assessment Upper Extremity Assessment: LUE deficits/detail LUE Deficits / Details: H/o Hemiparesis, active assisted motion avbailable at all joints   Lower Extremity Assessment Lower Extremity Assessment: Defer to PT evaluation   Cervical / Trunk Assessment Cervical / Trunk Assessment: Kyphotic  Communication Communication Communication: No difficulties   Cognition Arousal/Alertness: Awake/alert Behavior During Therapy: Anxious Overall Cognitive Status: Within Functional Limits for tasks assessed                     General Comments       Exercises       Shoulder Instructions      Home Living Family/patient expects to be discharged to::  Private residence Living Arrangements: Spouse/significant other Available Help at Discharge: Family;Available PRN/intermittently Type of Home: Mobile home Home Access: Stairs to enter   Entrance Stairs-Rails: Right;Left Home Layout: One level     Bathroom Shower/Tub: International aid/development worker Accessibility: Yes How Accessible: Accessible via walker Home Equipment: Tub bench          Prior Functioning/Environment               OT Diagnosis: Generalized weakness;Apraxia;Cognitive deficits   OT Problem List: Decreased strength;Impaired balance (sitting and/or standing);Impaired tone;Decreased safety awareness;Cardiopulmonary status limiting activity;Increased edema;Decreased activity tolerance;Decreased coordination;Decreased knowledge of use of DME or AE;Decreased cognition   OT Treatment/Interventions: Self-care/ADL training;Therapeutic exercise;DME and/or AE instruction;Therapeutic activities;Balance training;Cognitive remediation/compensation;Neuromuscular education;Patient/family education;Energy conservation    OT Goals(Current goals can be found in the care plan section) Acute Rehab OT Goals Patient Stated Goal: go to rehab OT Goal Formulation: With patient Time For Goal Achievement: 01/18/14 Potential to Achieve Goals: Good  OT Frequency: Min 2X/week   Barriers to D/C: Decreased caregiver support          Co-evaluation              End of Session Equipment Utilized During Treatment: Other (comment) (hemi walker) Nurse Communication: Precautions  Activity Tolerance: Patient tolerated treatment well Patient left: in bed;with bed alarm set   Time: 1430-1452 OT Time Calculation (min): 22 min Charges:  OT General Charges $OT Visit: 1 Procedure OT Evaluation $Initial OT Evaluation Tier I: 1 Procedure OT Treatments $Self Care/Home Management : 8-22 mins G-Codes:    Mariah Milling 01/30/14, 3:03 PM

## 2014-01-04 NOTE — Progress Notes (Addendum)
Speech Language Pathology Treatment: Dysphagia  Patient Details Name: [name] MRN: [id number] DOB: 14-Oct-1927 Today's Date: 01/04/2014 Time: 1020-1100 SLP Time Calculation (min): 40 min  Assessment / Plan / Recommendation Clinical Impression  Pt seen to assess tolerance of po diet and to educate pt/[name] to previous MBS results, compensation strategies and aspiration precautions.  Pt and [name] admit to not purchasing thickener after previous outpt MBS completed.  Pt admits that use of thickener decreases his coughing with intake.    SLP reviewed 3/30 MBS results with pt and [name], provided written compensation strategies for dysphagia, xerostomia, etc.  Extensive education completed with pt/[name] using teach back for most important factors.  Also discussed frazier water protocol for hydration if pt has poor intake of thickened liquids at home.    Recommend to continue dys3/nectar diet with strict adherence to precautions.  Suspect episodic aspiration will be ongoing due to pt's dysphagia from his CVA and his COPD. Hopeful for pt's pulmonary clearance ability to improve with rib fractures healing.    SlP to follow for dysphagia management-pt/[name] agreeable.       HPI HPI: 80 y.o. male with hx of dysphagia, CVA 10/14, admitted 4/8 with leg pain, SOB, PNA.  Pt has had recurrent episodes of PNA; pleural effusion that was tapped on 4/3/1 showed reactive mesothelial cells,   LLL / atx/ PNA and concern for aspiration in left lower lobe. 4/10 CXR reveals no significant change from recent CT scan with moderate left effusion and associated consolidated left lung. Pt is s/p thoracentesis yesterday and he reports improved breathing today.  Pt has recently fractured ribs (x2 months ago) per spouse [name].  OPMBS on 12/20/13 confirmed aspiration of thin liquids with compensatory strategies ineffective for airway protection; pt tolerated nectar-thick liquids without aspiration.  At that time, regular diet  with nectar-thick liquids was recommended, as well as OP SLP treatment for dysphagia.  Pt seen to assess tolerance of po diet.     Pertinent Vitals Afebrile, decreased   SLP Plan  Continue with current plan of care    Recommendations Diet recommendations: Dysphagia 3 (mechanical soft);Nectar-thick liquid Liquids provided via: Cup;No straw Medication Administration: Whole meds with puree Supervision: Patient able to self feed Compensations: Slow rate;Small sips/bites;Multiple dry swallows after each bite/sip Postural Changes and/or Swallow Maneuvers: Seated upright 90 degrees              Oral Care Recommendations: Oral care BID Follow up Recommendations: Outpatient SLP Plan: Continue with current plan of care    [place], [place] [facility] SLP [phone]

## 2014-01-04 NOTE — Progress Notes (Signed)
Name: [name] MRN: [id number] DOB: 1927/11/18    ADMISSION DATE:  12/31/2013 CONSULTATION DATE:  01/01/14  REFERRING MD :  [facility] PRIMARY SERVICE:  [facility]  CHIEF COMPLAINT:  Effusion, PNA  BRIEF PATIENT DESCRIPTION: 80 COPD (MR office) dysphagia aspiration, SOB    [name] is a 80 y.o. male dysphagia / TIA and have had recurrent episodes of PNA treated as out patient by his PCP this was followed by noted pleural effusion that was tapped on 4/3/1 showing reactive mesothelial cells 1.2 liters after CT demonstrated this effusion and LLL / atx/ PNA and concern aspiration in left lower lobe. Admitted now 4/8 leg pain then SOB. Pcxr taken reveals about same effusion left base on this admission. Consulted now for concern need bronch and effusion noted. No distress, no hemoptysis.  SIGNIFICANT EVENTS / STUDIES:  4/3 Ct chest - The left pleural effusion has not significantly changed in volume (THIS WAS DONE POST THORA) compared with the CT performed approximately 2 months ago.Progressive left lower lobe collapse without clear etiology. There is high-density particular matter within the collapsed left ower lobe, suggesting chronic aspiration. Depending on the results of thoracentesis, bronchoscopy may be warranted to exclude a centrally obstructing lesion. Increased patchy airspace disease at the right lung base,possibly atelectasis or aspiration. Several subacute left-sided rib fractures again noted, without significant change.Extensive atherosclerosis.  4/3 - US guided thora 1.2 liters, 632 WBC 56 %lymph, prot ratio estimate 0.64 = exudative, no ldh  CULTURES: 4/10 sputum>>> 4/10 BC>>>  ANTIBIOTICS: 4/8 zosyn>>> 4/8 vanc>>>  SUBJECTIVE:   4/14: Pl LDH 70/Serum LDH 152: 46%,   /Serum Alb 2.2gm% - Pl Albumin 1.7 = 0.5gm% .  -> LDH suggests transudate. Albumin gradient suggests exudate. Ctyology pending, 1.3L removed.   He denies complaints. Wife gives hx of left sided rib  fracture several months ago and problems of effusion post date this  VITAL SIGNS: Temp:  [97.5 F (36.4 C)-98.7 F (37.1 C)] 97.5 F (36.4 C) (04/14 0539) Pulse Rate:  [50-112] 50 (04/14 0539) Resp:  [18] 18 (04/14 0539) BP: (97-114)/(57-69) 114/69 mmHg (04/14 0539) SpO2:  [90 %-100 %] 100 % (04/14 0539)  PHYSICAL EXAMINATION: General:  No distress Neuro:  Nonfocal, weak diffuse, perrl HEENT:  jvd wnl Neck:  No stridor, secretions sounds with cough Cardiovascular:  s1 s2 RRR Lungs:  ronchi left base Abdomen:  Soft, bs wnl, no r Skin:  bruising from IV left arm   PULMONARY  Recent Labs Lab 12/29/13 0953  TCO2 25    CBC  Recent Labs Lab 01/02/14 0555 01/03/14 0945 01/04/14 0445  HGB 7.7* 8.4* 7.8*  HCT 22.7* 25.0* 24.0*  WBC 7.2 6.9 6.8  PLT 218 254 224    COAGULATION  Recent Labs Lab 01/02/14 0555  INR 1.21    CARDIAC  No results found for this basename: TROPONINI,  in the last 168 hours No results found for this basename: PROBNP,  in the last 168 hours   CHEMISTRY  Recent Labs Lab 12/29/13 0953 12/31/13 1835 01/01/14 0012 01/01/14 0557 01/03/14 0945 01/04/14 0445  NA 139 134*  --  137  --  138  K 4.5 5.3  --  4.3  --  4.4  CL 101 99  --  100  --  103  CO2  --  24  --  27  --  26  GLUCOSE 173* 169*  --  157*  --  230*  BUN 26* 40*  --  36*  --  27*  CREATININE 0.90 0.74  --  0.83 0.78 0.75  CALCIUM  --  8.8  --  9.0  --  8.2*  MG  --   --  1.7  --   --   --    Estimated Creatinine Clearance: 58.6 ml/min (by C-G formula based on Cr of 0.75).   LIVER  Recent Labs Lab 12/31/13 1835 01/01/14 0557 01/02/14 0555 01/04/14 0445  AST 54* 29  --   --   ALT 18 16  --   --   ALKPHOS 65 76  --   --   BILITOT 0.7 0.9  --   --   PROT 6.2 6.1  --   --   ALBUMIN 2.9* 2.8*  --  2.2*  INR  --   --  1.21  --      INFECTIOUS  Recent Labs Lab 12/31/13 1842 01/02/14 0907 01/04/14 0445  LATICACIDVEN 1.23  --   --   PROCALCITON  --   <0.10 <0.10     ENDOCRINE CBG (last 3)   Recent Labs  01/03/14 1706 01/03/14 2219 01/04/14 0738  GLUCAP 190* 268* 170*         IMAGING x48h  Dg Chest 1 View  01/03/2014   CLINICAL DATA:  post left thoracentesis  EXAM: CHEST - 1 VIEW  COMPARISON:  US THORACENTESIS ASP PLEURAL SPACE W/IMG GUIDE dated 01/03/2014  FINDINGS: The cardiac silhouette is within the upper limits normal. Atherosclerotic calcifications appreciated within the aorta. Mild blunting of the left costophrenic angle appreciated. There is no evidence of pneumothorax. Stable calcified granuloma right lung base. There is elevation of the right hemidiaphragm and blunting of the right costophrenic angle. Linear areas of increased density appreciated within the lung bases. Degenerative changes are identified within the shoulders.  IMPRESSION: No evidence of a pneumothorax status post thoracentesis. Small left pleural effusion and trace right effusion.  Areas of scarring versus atelectasis within the lung bases.   Electronically Signed   By: Margaree Mackintosh M.D.   On: 01/03/2014 15:51   Dg Chest Port 1 View  01/03/2014   CLINICAL DATA:  Re-evaluate left lower lobe process.  EXAM: PORTABLE CHEST - 1 VIEW  COMPARISON:  12/31/2013.  FINDINGS: There is a persistent moderate-sized left pleural effusion overlying atelectasis. No definite pneumothorax. Left-sided rib fractures are noted. Suspect a small right pleural effusion and minimal overlying atelectasis. The cardiac silhouette, mediastinal and hilar contours are stable. Stable tortuosity and calcification of the thoracic aorta.  IMPRESSION: Persistent left effusion and overlying atelectasis.   Electronically Signed   By: Kalman Jewels M.D.   On: 01/03/2014 07:33   US Thoracentesis Asp Pleural Space W/img Guide  01/03/2014   CLINICAL DATA:  Shortness of breath, recurrent left-sided pleural effusion. Request diagnostic and therapeutic thoracentesis.  EXAM: ULTRASOUND GUIDED left  THORACENTESIS  COMPARISON:  Previous thoracentesis  PROCEDURE: An ultrasound guided thoracentesis was thoroughly discussed with the patient and questions answered. The benefits, risks, alternatives and complications were also discussed. The patient understands and wishes to proceed with the procedure. Written consent was obtained.  Ultrasound was performed to localize and mark an adequate pocket of fluid in the left chest. The area was then prepped and draped in the normal sterile fashion. 1% Lidocaine was used for local anesthesia. Under ultrasound guidance a 19 gauge Yueh catheter was introduced. Thoracentesis was performed. The catheter was removed and a dressing applied.  Complications:  None immediate  FINDINGS: A total of approximately 1.3 L of amber colored fluid was removed. A fluid sample wassent for laboratory analysis.  IMPRESSION: Successful ultrasound guided left thoracentesis yielding 1.3 L of pleural fluid.  Read by: Ascencion Dike PA-C   Electronically Signed   By: Daryll Brod M.D.   On: 01/03/2014 15:30      ASSESSMENT / PLAN:  Dysphagia  recurrent Likely exudative effusion likely secondary to left lower lobe atx from chronic aspiration Aspiration PNA with noncompliance diet COPD    - still very deconditioned.. Failed swallow on D3 diet per [facility] - effusion transudate v exudate; unclear because of mixed signals on LDH and protien/albumin studies especially he is not on lasix   - ? Related to dysphagia and LLL collapse v recent rib fracture   PLAN  - get CT chest wo cotnrast supine and prone to better evaluate Left lower lobe --we need to get him compliant with his clear aspiration events and move the secretions in Left lower lobe in order to aerate this left lower lobe and improve associated effusion -no role bronch at this stage unless after the above plan no improvements, then would consider bronch assessment for endobronchial lesion -strict asp diet -chest pt to move  secretions, flutter, IS   D.w  Patient and wife    Dr. Brand Males, M.D., [facility].C.P Pulmonary and Critical Care Medicine Staff Physician [facility] Pager: [phone], If no answer or between  15:00h - 7:00h: call [phone]  01/04/2014 9:28 AM

## 2014-01-04 NOTE — Progress Notes (Signed)
[facility] PROGRESS NOTE  [name] POE:[id number] DOB: [birth year] DOA: 12/31/2013 PCP: [name], MD  Assessment/Plan:  PNA: Presumed to be from aspiration pneumonia. - SLP evaluated, dys 3 - this is his second hospitalization for aspiration and his 4th time that he requires Abx for aspiration PNA. This is with onset following his CVA last October. He was somewhat functional prior to his CVA and discussed a bit today with his wife and patient about his height risk of aspiration, multiple hospitalization after his stroke. I brought up the feeding tube, however at this point they don't want to take his option into consideration, especially given the fact that it may not even prevent further aspiration events and decrease his life quality. They had some problems with diet consistency at home, which may play a role, but they agree to a skilled nursing facility discharge when he leaves this hospital, and hopefully assistance during feeding and supervision as well as ongoing speech therapy will help with future aspiration events.  - continue Vanc/PipTazo, narrow to Augmentin on d/c  - Pulmonology consult for recurrent pulmonary effusions s/p thoracentesis on 4/13, plan for CT chest prone and supine 4/14.  HTN (hypertension)- Pt on B blocker and blood pressure relatively well controlled on this.  Severe malnutrition - nutrition consulted.   History of CVA - last year, stable neurologic exam  Diabetes mellitus, type 2- Continue SSI  COPD (chronic obstructive pulmonary disease)- Compensated currently continue bronchodilators  Atrial fibrillation- Rate controlled on carvedilol - Xarelto on hold secondary to anemia with positive Hemoccult   Anemia with positive Hemoccult - Continue holding xarelto - Hb 7.7 >> 8.4 >> 7.8. GI signed off 4/13, will followup as an outpatient, or if he has an appointment in 2 weeks and they will consider colonoscopy if his respiratory status improves at  that time.  Pleural effusion, left - Will defer management recommendations to pulm  UTI (lower urinary tract infection) - Continue broad-spectrum antibiotics - Followup with urine culture  Code Status: Full code Family Communication: No family at bedside discussed directly with patient Disposition Plan: SNF when ready. Patient and wife agreeable.    Consultants:  Gastroenterologist  Pulmonology  Procedures:  None  Antibiotics:  Zosyn and vancomycin  HPI/Subjective: No new complaints patient denies any bright blood per rectum  Objective: Filed Vitals:   01/04/14 0539  BP: 114/69  Pulse: 50  Temp: 97.5 F (36.4 C)  Resp: 18    Intake/Output Summary (Last 24 hours) at 01/04/14 0811 Last data filed at 01/04/14 0658  Gross per 24 hour  Intake   2570 ml  Output    900 ml  Net   1670 ml   Filed Weights   12/31/13 1810 12/31/13 2202  Weight: 65.318 kg (144 lb) 64.5 kg (142 lb 3.2 oz)    Exam:   General:  Pt in NAD, alert and awake  Cardiovascular: normal s1 and s2, no murmurs  Respiratory: decreased breath sounds over base of L lung field, no wheezes  Abdomen: soft, Nt, nd  Musculoskeletal: no cyanosis or clubbing   Data Reviewed: Basic Metabolic Panel:  Recent Labs Lab 12/29/13 0953 12/31/13 1835 01/01/14 0012 01/01/14 0557 01/03/14 0945 01/04/14 0445  NA 139 134*  --  137  --  138  K 4.5 5.3  --  4.3  --  4.4  CL 101 99  --  100  --  103  CO2  --  24  --  27  --  26  GLUCOSE 173* 169*  --  157*  --  230*  BUN 26* 40*  --  36*  --  27*  CREATININE 0.90 0.74  --  0.83 0.78 0.75  CALCIUM  --  8.8  --  9.0  --  8.2*  MG  --   --  1.7  --   --   --    Liver Function Tests:  Recent Labs Lab 12/31/13 1835 01/01/14 0557 01/04/14 0445  AST 54* 29  --   ALT 18 16  --   ALKPHOS 65 76  --   BILITOT 0.7 0.9  --   PROT 6.2 6.1  --   ALBUMIN 2.9* 2.8* 2.2*   CBC:  Recent Labs Lab 12/29/13 0953 12/31/13 1835 01/02/14 0555  01/03/14 0945 01/04/14 0445  WBC  --  13.9* 7.2 6.9 6.8  NEUTROABS  --  9.6*  --   --   --   HGB 11.2* 8.4* 7.7* 8.4* 7.8*  HCT 33.0* 23.0* 22.7* 25.0* 24.0*  MCV  --  94.7 99.1 100.8* 101.3*  PLT  --  283 218 254 224   CBG:  Recent Labs Lab 01/03/14 0738 01/03/14 1143 01/03/14 1706 01/03/14 2219 01/04/14 0738  GLUCAP 193* 232* 190* 268* 170*    Recent Results (from the past 240 hour(s))  CULTURE, BLOOD (ROUTINE X 2)     Status: None   Collection Time    12/31/13  6:35 PM      Result Value Ref Range Status   Specimen Description BLOOD RIGHT FOREARM  4 ML IN [facility] BOTTLE   Final   Special Requests NONE   Final   Culture  Setup Time     Final   Value: 01/01/2014 01:04     Performed at [facility]   Culture     Final   Value:        BLOOD CULTURE RECEIVED NO GROWTH TO DATE CULTURE WILL BE HELD FOR 5 DAYS BEFORE ISSUING A FINAL NEGATIVE REPORT     Performed at [facility]   Report Status PENDING   Incomplete  CULTURE, BLOOD (ROUTINE X 2)     Status: None   Collection Time    12/31/13  7:21 PM      Result Value Ref Range Status   Specimen Description BLOOD RIGHT ARM  4 ML IN [facility] BOTTLE   Final   Special Requests NONE   Final   Culture  Setup Time     Final   Value: 01/01/2014 01:02     Performed at [facility]   Culture     Final   Value:        BLOOD CULTURE RECEIVED NO GROWTH TO DATE CULTURE WILL BE HELD FOR 5 DAYS BEFORE ISSUING A FINAL NEGATIVE REPORT     Performed at [facility]   Report Status PENDING   Incomplete  URINE CULTURE     Status: None   Collection Time    01/01/14  5:31 PM      Result Value Ref Range Status   Specimen Description URINE, CATHETERIZED   Final   Special Requests Immunocompromised   Final   Culture  Setup Time     Final   Value: 01/02/2014 02:12     Performed at [facility]     Final   Value: NO GROWTH     Performed at [facility]  Culture     Final   Value: NO  GROWTH     Performed at [facility]   Report Status 01/03/2014 FINAL   Final  CULTURE, EXPECTORATED SPUTUM-ASSESSMENT     Status: None   Collection Time    01/03/14 11:30 AM      Result Value Ref Range Status   Specimen Description SPUTUM   Final   Special Requests NONE   Final   Sputum evaluation     Final   Value: THIS SPECIMEN IS ACCEPTABLE. RESPIRATORY CULTURE REPORT TO FOLLOW.   Report Status 01/03/2014 FINAL   Final  BODY FLUID CULTURE     Status: None   Collection Time    01/03/14  3:34 PM      Result Value Ref Range Status   Specimen Description PLEURAL   Final   Special Requests Normal   Final   Gram Stain     Final   Value: RARE WBC PRESENT,BOTH PMN AND MONONUCLEAR     NO ORGANISMS SEEN     Performed at [facility]   Culture     Final   Value: NO GROWTH 1 DAY     Performed at [facility]   Report Status PENDING   Incomplete     Studies: Dg Chest 1 View  01/03/2014   CLINICAL DATA:  post left thoracentesis  EXAM: CHEST - 1 VIEW  COMPARISON:  US THORACENTESIS ASP PLEURAL SPACE W/IMG GUIDE dated 01/03/2014  FINDINGS: The cardiac silhouette is within the upper limits normal. Atherosclerotic calcifications appreciated within the aorta. Mild blunting of the left costophrenic angle appreciated. There is no evidence of pneumothorax. Stable calcified granuloma right lung base. There is elevation of the right hemidiaphragm and blunting of the right costophrenic angle. Linear areas of increased density appreciated within the lung bases. Degenerative changes are identified within the shoulders.  IMPRESSION: No evidence of a pneumothorax status post thoracentesis. Small left pleural effusion and trace right effusion.  Areas of scarring versus atelectasis within the lung bases.   Electronically Signed   By: Margaree Mackintosh M.D.   On: 01/03/2014 15:51   Dg Chest Port 1 View  01/03/2014   CLINICAL DATA:  Re-evaluate left lower lobe process.  EXAM: PORTABLE CHEST -  1 VIEW  COMPARISON:  12/31/2013.  FINDINGS: There is a persistent moderate-sized left pleural effusion overlying atelectasis. No definite pneumothorax. Left-sided rib fractures are noted. Suspect a small right pleural effusion and minimal overlying atelectasis. The cardiac silhouette, mediastinal and hilar contours are stable. Stable tortuosity and calcification of the thoracic aorta.  IMPRESSION: Persistent left effusion and overlying atelectasis.   Electronically Signed   By: Kalman Jewels M.D.   On: 01/03/2014 07:33   US Thoracentesis Asp Pleural Space W/img Guide  01/03/2014   CLINICAL DATA:  Shortness of breath, recurrent left-sided pleural effusion. Request diagnostic and therapeutic thoracentesis.  EXAM: ULTRASOUND GUIDED left THORACENTESIS  COMPARISON:  Previous thoracentesis  PROCEDURE: An ultrasound guided thoracentesis was thoroughly discussed with the patient and questions answered. The benefits, risks, alternatives and complications were also discussed. The patient understands and wishes to proceed with the procedure. Written consent was obtained.  Ultrasound was performed to localize and mark an adequate pocket of fluid in the left chest. The area was then prepped and draped in the normal sterile fashion. 1% Lidocaine was used for local anesthesia. Under ultrasound guidance a 19 gauge Yueh catheter was introduced. Thoracentesis was performed. The catheter was removed  and a dressing applied.  Complications:  None immediate  FINDINGS: A total of approximately 1.3 L of amber colored fluid was removed. A fluid sample wassent for laboratory analysis.  IMPRESSION: Successful ultrasound guided left thoracentesis yielding 1.3 L of pleural fluid.  Read by: Ascencion Dike PA-C   Electronically Signed   By: Daryll Brod M.D.   On: 01/03/2014 15:30    Scheduled Meds: . albuterol  2.5 mg Nebulization QID  . carvedilol  6.25 mg Oral BID WC  . cilostazol  50 mg Oral BID  . famotidine  10 mg Oral Daily  .  feeding supplement (ENSURE)  1 Container Oral TID BM  . insulin aspart  0-9 Units Subcutaneous TID WC  . loratadine  10 mg Oral Daily  . metoprolol  2.5 mg Intravenous Once  . piperacillin-tazobactam (ZOSYN)  IV  3.375 g Intravenous Q8H  . simvastatin  40 mg Oral QPM  . tiotropium  18 mcg Inhalation Daily  . vancomycin  750 mg Intravenous BID   Continuous Infusions: . dextrose 5 % and 0.45 % NaCl with KCl 20 mEq/L 50 mL/hr (01/03/14 1135)     Time spent: 25 minutes    [place] [facility] Pager [phone] If 7PM-7AM, please contact night-coverage at [email or web address], password [facility] 01/04/2014, 8:11 AM  LOS: 4 days

## 2014-01-05 LAB — CBC
HEMATOCRIT: 24.3 % — AB (ref 39.0–52.0)
Hemoglobin: 7.8 g/dL — ABNORMAL LOW (ref 13.0–17.0)
MCH: 32.4 pg (ref 26.0–34.0)
MCHC: 32.1 g/dL (ref 30.0–36.0)
MCV: 100.8 fL — ABNORMAL HIGH (ref 78.0–100.0)
Platelets: 263 10*3/uL (ref 150–400)
RBC: 2.41 MIL/uL — ABNORMAL LOW (ref 4.22–5.81)
RDW: 13.5 % (ref 11.5–15.5)
WBC: 7.5 10*3/uL (ref 4.0–10.5)

## 2014-01-05 LAB — BASIC METABOLIC PANEL
BUN: 23 mg/dL (ref 6–23)
CHLORIDE: 104 meq/L (ref 96–112)
CO2: 27 mEq/L (ref 19–32)
CREATININE: 0.74 mg/dL (ref 0.50–1.35)
Calcium: 8.2 mg/dL — ABNORMAL LOW (ref 8.4–10.5)
GFR calc Af Amer: >90 mL/min (ref >90)
GFR calc non Af Amer: 83 mL/min — ABNORMAL LOW (ref >90)
GLUCOSE: 188 mg/dL — AB (ref 70–99)
POTASSIUM: 4.5 meq/L (ref 3.7–5.3)
Sodium: 139 mEq/L (ref 137–147)

## 2014-01-05 LAB — GLUCOSE, CAPILLARY
GLUCOSE-CAPILLARY: 171 mg/dL — AB (ref 70–99)
GLUCOSE-CAPILLARY: 202 mg/dL — AB (ref 70–99)
GLUCOSE-CAPILLARY: 170 mg/dL — AB (ref 70–99)
GLUCOSE-CAPILLARY: 181 mg/dL — AB (ref 70–99)

## 2014-01-05 LAB — CULTURE, RESPIRATORY W GRAM STAIN
Culture: NORMAL
Gram Stain: NONE SEEN

## 2014-01-05 LAB — CULTURE, RESPIRATORY

## 2014-01-05 MED ORDER — LEVOFLOXACIN 500 MG PO TABS
500.0000 mg | ORAL_TABLET | Freq: Every day | ORAL | Status: DC
  Administered 2014-01-05 – 2014-01-06 (×2): 500 mg via ORAL
  Filled 2014-01-05 (×2): qty 1

## 2014-01-05 MED ORDER — SODIUM CHLORIDE 0.9 % IV SOLN: INTRAVENOUS | Status: DC | PRN

## 2014-01-05 NOTE — Progress Notes (Signed)
Name: [name] MRN: [id number] DOB: Feb 01, 1928    ADMISSION DATE:  12/31/2013 CONSULTATION DATE:  01/01/14  REFERRING MD :  [facility] PRIMARY SERVICE:  [facility]  CHIEF COMPLAINT:  Effusion, PNA  BRIEF PATIENT DESCRIPTION: 3 COPD (MR office) dysphagia aspiration, SOB    [name] is a 80 y.o. male dysphagia / TIA and have had recurrent episodes of PNA treated as out patient by his PCP this was followed by noted pleural effusion that was tapped on 4/3/1 showing reactive mesothelial cells 1.2 liters after CT demonstrated this effusion and LLL / atx/ PNA and concern aspiration in left lower lobe. Admitted now 4/8 leg pain then SOB. Pcxr taken reveals about same effusion left base on this admission. Consulted now for concern need bronch and effusion noted. No distress, no hemoptysis.  SIGNIFICANT EVENTS / STUDIES:  4/3 Ct chest - The left pleural effusion has not significantly changed in volume (THIS WAS DONE POST THORA) compared with the CT performed approximately 2 months ago.Progressive left lower lobe collapse without clear etiology. There is high-density particular matter within the collapsed left ower lobe, suggesting chronic aspiration. Depending on the results of thoracentesis, bronchoscopy may be warranted to exclude a centrally obstructing lesion. Increased patchy airspace disease at the right lung base,possibly atelectasis or aspiration. Several subacute left-sided rib fractures again noted, without significant change.Extensive atherosclerosis.  4/3 - US guided thora 1.2 liters, 632 WBC 56 %lymph, prot ratio estimate 0.64 = exudative, no ldh   4/14: Pl LDH 70/Serum LDH 152: 46%,   /Serum Alb 2.2gm% - Pl Albumin 1.7 = 0.5gm% .  -> LDH suggests transudate. Albumin gradient suggests exudate. Ctyology non diagnostic mesothelial cells 1.3L removed. Wife gives hx of left sided rib fracture several months ago and problems of effusion post date this      CULTURES: 4/10  sputum>>> 4/10 BC>>>  ANTIBIOTICS: 4/8 zosyn>>> 4/8 vanc>>>  SUBJECTIVE:   4/15: Wife not at bedside. He denies complaints  s. VITAL SIGNS: Temp:  [97.3 F (36.3 C)-98.2 F (36.8 C)] 98.2 F (36.8 C) (04/15 0428) Pulse Rate:  [74-101] 74 (04/15 0428) Resp:  [18-20] 18 (04/15 0428) BP: (90-112)/(56-68) 111/63 mmHg (04/15 0428) SpO2:  [96 %-100 %] 98 % (04/15 0759)  PHYSICAL EXAMINATION: General:  No distress Neuro:  Nonfocal, weak diffuse, perrl HEENT:  jvd wnl Neck:  No stridor, secretions sounds with cough Cardiovascular:  s1 s2 RRR Lungs:  ronchi left base Abdomen:  Soft, bs wnl, no r Skin:  bruising from IV left arm   PULMONARY No results found for this basename: PHART, PCO2, PCO2ART, PO2, PO2ART, HCO3, TCO2, O2SAT,  in the last 168 hours  CBC  Recent Labs Lab 01/03/14 0945 01/04/14 0445 01/05/14 0340  HGB 8.4* 7.8* 7.8*  HCT 25.0* 24.0* 24.3*  WBC 6.9 6.8 7.5  PLT 254 224 263    COAGULATION  Recent Labs Lab 01/02/14 0555  INR 1.21    CARDIAC  No results found for this basename: TROPONINI,  in the last 168 hours No results found for this basename: PROBNP,  in the last 168 hours   CHEMISTRY  Recent Labs Lab 12/31/13 1835 01/01/14 0012 01/01/14 0557 01/03/14 0945 01/04/14 0445 01/05/14 0340  NA 134*  --  137  --  138 139  K 5.3  --  4.3  --  4.4 4.5  CL 99  --  100  --  103 104  CO2 24  --  27  --  26  27  GLUCOSE 169*  --  157*  --  230* 188*  BUN 40*  --  36*  --  27* 23  CREATININE 0.74  --  0.83 0.78 0.75 0.74  CALCIUM 8.8  --  9.0  --  8.2* 8.2*  MG  --  1.7  --   --   --   --    Estimated Creatinine Clearance: 58.6 ml/min (by C-G formula based on Cr of 0.74).   LIVER  Recent Labs Lab 12/31/13 1835 01/01/14 0557 01/02/14 0555 01/04/14 0445  AST 54* 29  --   --   ALT 18 16  --   --   ALKPHOS 65 76  --   --   BILITOT 0.7 0.9  --   --   PROT 6.2 6.1  --   --   ALBUMIN 2.9* 2.8*  --  2.2*  INR  --   --  1.21  --       INFECTIOUS  Recent Labs Lab 12/31/13 1842 01/02/14 0907 01/04/14 0445  LATICACIDVEN 1.23  --   --   PROCALCITON  --  <0.10 <0.10     ENDOCRINE CBG (last 3)   Recent Labs  01/04/14 1714 01/04/14 2236 01/05/14 0740  GLUCAP 254* 191* 171*         IMAGING x48h  Dg Chest 1 View  01/03/2014   CLINICAL DATA:  post left thoracentesis  EXAM: CHEST - 1 VIEW  COMPARISON:  US THORACENTESIS ASP PLEURAL SPACE W/IMG GUIDE dated 01/03/2014  FINDINGS: The cardiac silhouette is within the upper limits normal. Atherosclerotic calcifications appreciated within the aorta. Mild blunting of the left costophrenic angle appreciated. There is no evidence of pneumothorax. Stable calcified granuloma right lung base. There is elevation of the right hemidiaphragm and blunting of the right costophrenic angle. Linear areas of increased density appreciated within the lung bases. Degenerative changes are identified within the shoulders.  IMPRESSION: No evidence of a pneumothorax status post thoracentesis. Small left pleural effusion and trace right effusion.  Areas of scarring versus atelectasis within the lung bases.   Electronically Signed   By: Margaree Mackintosh M.D.   On: 01/03/2014 15:51   Ct Chest Wo Contrast  01/04/2014   CLINICAL DATA:  Evaluate left pleural effusion.  EXAM: CT CHEST WITHOUT CONTRAST  TECHNIQUE: Multidetector CT imaging of the chest was performed following the standard protocol without IV contrast.  COMPARISON:  12/24/2013.  FINDINGS: Mild changes of centrilobular emphysema. Complete atelectasis of the left lower lobe is identified. No clear etiology is identified. Moderate loculated left pleural effusion is identified which on today's exam is predominantly located overlying the anterior left upper lobe. Calcified granuloma is identified within the right middle lobe.  The heart size is moderately enlarged. There is calcified atherosclerotic disease involving the thoracic aorta as well as  the LAD, left circumflex and RCA coronary arteries. No pericardial effusion identified. There are no pathologically enlarged mediastinal or hilar lymph nodes. No axillary or supraclavicular adenopathy noted.  Incidental imaging through the upper abdomen shows no acute findings.  Review of the visualized osseous structures shows multiple chronic left posterior rib fractures involving the eighth through eleventh ribs.  IMPRESSION: 1. Persistent complete atelectasis of the left lower lobe. No clear etiology is identified. Consider further evaluation with bronchoscopy. 2. Partially loculated left pleural effusion predominantly overlies the anterior left upper lobe. 3. Atherosclerotic disease including multi vessel coronary artery calcifications. 4. Chronic left posterior rib fractures.  Electronically Signed   By: Kerby Moors M.D.   On: 01/04/2014 16:16   US Thoracentesis Asp Pleural Space W/img Guide  01/03/2014   CLINICAL DATA:  Shortness of breath, recurrent left-sided pleural effusion. Request diagnostic and therapeutic thoracentesis.  EXAM: ULTRASOUND GUIDED left THORACENTESIS  COMPARISON:  Previous thoracentesis  PROCEDURE: An ultrasound guided thoracentesis was thoroughly discussed with the patient and questions answered. The benefits, risks, alternatives and complications were also discussed. The patient understands and wishes to proceed with the procedure. Written consent was obtained.  Ultrasound was performed to localize and mark an adequate pocket of fluid in the left chest. The area was then prepped and draped in the normal sterile fashion. 1% Lidocaine was used for local anesthesia. Under ultrasound guidance a 19 gauge Yueh catheter was introduced. Thoracentesis was performed. The catheter was removed and a dressing applied.  Complications:  None immediate  FINDINGS: A total of approximately 1.3 L of amber colored fluid was removed. A fluid sample wassent for laboratory analysis.  IMPRESSION:  Successful ultrasound guided left thoracentesis yielding 1.3 L of pleural fluid.  Read by: Ascencion Dike PA-C   Electronically Signed   By: Daryll Brod M.D.   On: 01/03/2014 15:30      ASSESSMENT / PLAN:  Dysphagia  recurrent Likely exudative effusion likely secondary to left lower lobe atx from chronic aspiration Aspiration PNA with noncompliance diet COPD    - still very deconditioned.. Failed swallow on D3 diet per [facility] - effusion is  transudate v exudate; unclear because of mixed signals on LDH and protien/albumin studies especially he is not on lasix   - Definitely  Related to  LLL collapse and/or recent rib fracture   - Etiology for LLL collapse - ? Chronic aspiration v endobronchial lesion   PLAN  - strict asp diet -chest pt to move secretions, flutter, IS - needs bronch but unlikely will handle moderate sedation; will address in office at followup - appt given to see me at [facility] 02/07/14 9.45am monday - Current fluid and abx per [facility]  D.w  Patient    PCCM will sign off   Dr. Brand Males, M.D., [facility].C.P Pulmonary and Critical Care Medicine Staff Physician [place] [facility] Pager: [phone], If no answer or between  15:00h - 7:00h: call [phone]  01/05/2014 10:02 AM

## 2014-01-05 NOTE — Progress Notes (Signed)
Patient and spouse have decided on [facility].  [name] C. [place] MSW, [place]

## 2014-01-05 NOTE — Progress Notes (Signed)
Physical Therapy Treatment Patient Details Name: JEFFRY VOGELSANG MRN: [id number] DOB: 1928-02-11 Today's Date: 01/05/2014    History of Present Illness Family states that he woke up last with sever leg pain he was sent to [facility] ER and was set home with muscle spasm. Family states he could not walk since then. Today he started to have shortness of breath and presented to ER he was found to have likely PNA.  He had a low grade fever up to 99.9 today. CT of the chest was done 3/31 and showed high-density particular matter within the collapsed left lower lobe, suggesting chronic aspiration. He had swallow study done showing evidence of aspiration     PT Comments    Assisted pt to EOB.  Applied L UE sling (hx CVA L hemi).  Attempted amb however R knee buckling and still present with numbness.  Assisted pt to recliner via "[place]" squat pivot 1/4 turn total assist + 1.  Positioned in recliner.   Follow Up Recommendations  SNF     Equipment Recommendations       Recommendations for Other Services       Precautions / Restrictions Precautions Precautions: Fall Precaution Comments: h/o L hemiparesis now with decreased coordination and numbness in RLE Restrictions Weight Bearing Restrictions: No    Mobility  Bed Mobility Overal bed mobility: Needs Assistance Bed Mobility: Supine to Sit     Supine to sit: Mod assist     General bed mobility comments: increased time and use of bed pad to scoot hips to EOB.  Transfers Overall transfer level: Needs assistance Equipment used: Hemi-walker Transfers: Sit to/from Stand Sit to Stand: +2 physical assistance;+2 safety/equipment;Max assist         General transfer comment: pt able to initiate pivot but then RLE buckled requiring total assist to prevent fall, pt used hemiwalker on R.  Attempted standing x 2 but unable to weight shift and unable to advance either LE despite tactile cueing and trunk stability.  Assisted pt from bed to  recliner vis "bear Hug" 1/4 squat pivot.    Ambulation/Gait         Gait velocity: unable to attempt 2nd R LE (good leg) buckling and c/i numbness        Stairs            Wheelchair Mobility    Modified Rankin (Stroke Patients Only)       Balance                                    Cognition                            Exercises      General Comments        Pertinent Vitals/Pain     Home Living                      Prior Function            PT Goals (current goals can now be found in the care plan section) Progress towards PT goals: Progressing toward goals    Frequency  Min 3X/week    PT Plan      Co-evaluation             End of Session Equipment Utilized During Treatment: Gait belt Activity Tolerance:  Patient limited by fatigue Patient left: in chair;with call bell/phone within reach;with family/visitor present     Time: 1334-1400 PT Time Calculation (min): 26 min  Charges:  $Therapeutic Activity: 23-37 mins                    G Codes:      Rica Koyanagi  PTA [facility]  Acute  Rehab Pager      [phone]

## 2014-01-05 NOTE — Progress Notes (Signed)
CSW met with patient. Patient is alert and oriented X3. CSW provided bed offers. He will review and give choice to CSW.  Mireyah Chervenak C. [place] MSW, [place]

## 2014-01-05 NOTE — Progress Notes (Signed)
Patient ID: [name], male   DOB: October 12, 1927, 80 y.o.   MRN: [id number]  [facility] PROGRESS NOTE  [name] DTO:[id number] DOB: February 28, 1928 DOA: 12/31/2013 PCP: [name], MD  Brief narrative: 80 y.o. male dysphagia / TIA and recurrent episodes of PNA treated as an outpatient by his PCP, followed by noted pleural effusion that was tapped on 4/3 (1.2 L removed) showing reactive mesothelial cells. CT chest done after demonstrated this effusion and LLL / atx/ PNA and concern for aspiration in left lower lobe. Admitted this time with progressively worsening shortness of breath. CXR taken reveals about same effusion at the left base.    PNA: Presumed to be from aspiration pneumonia.  - SLP evaluated, dys 3 diet recommended  - pt is clinically stable this am, reports he feels better, denies shortness of breath - will transition to oral ABX Levaquin this AM - appreciate PCCM input  HTN (hypertension) - Pt on beta blocker and blood pressure relatively well controlled  Severe malnutrition  - nutrition consulted and dysphagia 3 diet recommended  History of CVA  - last year, stable neurologic exam  Diabetes mellitus, type 2 - Continue SSI  COPD (chronic obstructive pulmonary disease) - Compensated currently, continue bronchodilators  Atrial fibrillation - Rate controlled on carvedilol  - Xarelto on hold secondary to anemia with positive Hemoccult  Anemia with positive Hemoccult  - Continue holding xarelto  - Hb 7.7 >> 8.4 >> 7.8. And stable over 24 hours, GI signed off 4/13, will followup as an outpatient Pleural effusion, left  - Will defer management recommendations to pulm  UTI (lower urinary tract infection)  - Levaquin should be adequate in coverage   Consultants:  Gastroenterology  Pulmonology  Antibiotics:  Zosyn and vancomycin change to oral Levaquin 01/05/2014  Procedures/Studies:  Ct Chest Wo Contrast   2014-01-17  Persistent complete atelectasis of the  left lower lobe. No clear etiology is identified. Consider further evaluation with bronchoscopy. Partially loculated left pleural effusion predominantly overlies the anterior left upper lobe. Atherosclerotic disease including multi vessel coronary artery calcifications. 4. Chronic left posterior rib fractures.     Code Status: Full Family Communication: Pt at bedside Disposition Plan: Home when medically stable  HPI/Subjective: No events overnight.   Objective: Filed Vitals:   01/05/14 0428 01/05/14 0759 01/05/14 1425 01/05/14 1726  BP: 111/63  101/46   Pulse: 74  102   Temp: 98.2 F (36.8 C)  97.4 F (36.3 C)   TempSrc: Oral  Oral   Resp: 18  20   Height:      Weight:      SpO2: 96% 98% 99% 98%    Intake/Output Summary (Last 24 hours) at 01/05/14 1842 Last data filed at 01/05/14 1800  Gross per 24 hour  Intake   2590 ml  Output   1275 ml  Net   1315 ml    Exam:   General:  Pt is alert, follows commands appropriately, not in acute distress  Cardiovascular: Regular rate and rhythm, S1/S2, no murmurs, no rubs, no gallops  Respiratory: Clear to auscultation bilaterally, no wheezing, bibasilar rhonchi   Abdomen: Soft, non tender, non distended, bowel sounds present, no guarding  Data Reviewed: Basic Metabolic Panel:  Recent Labs Lab 12/31/13 1835 01/01/14 0012 01/01/14 0557 01/03/14 0945 17-Jan-2014 0445 01/05/14 0340  NA 134*  --  137  --  138 139  K 5.3  --  4.3  --  4.4 4.5  CL 99  --  100  --  103 104  CO2 24  --  27  --  26 27  GLUCOSE 169*  --  157*  --  230* 188*  BUN 40*  --  36*  --  27* 23  CREATININE 0.74  --  0.83 0.78 0.75 0.74  CALCIUM 8.8  --  9.0  --  8.2* 8.2*  MG  --  1.7  --   --   --   --    Liver Function Tests:  Recent Labs Lab 12/31/13 1835 01/01/14 0557 01/04/14 0445  AST 54* 29  --   ALT 18 16  --   ALKPHOS 65 76  --   BILITOT 0.7 0.9  --   PROT 6.2 6.1  --   ALBUMIN 2.9* 2.8* 2.2*   CBC:  Recent Labs Lab  12/31/13 1835 01/02/14 0555 01/03/14 0945 01/04/14 0445 01/05/14 0340  WBC 13.9* 7.2 6.9 6.8 7.5  NEUTROABS 9.6*  --   --   --   --   HGB 8.4* 7.7* 8.4* 7.8* 7.8*  HCT 23.0* 22.7* 25.0* 24.0* 24.3*  MCV 94.7 99.1 100.8* 101.3* 100.8*  PLT 283 218 254 224 263   CBG:  Recent Labs Lab 01/04/14 1714 01/04/14 2236 01/05/14 0740 01/05/14 1153 01/05/14 1709  GLUCAP 254* 191* 171* 202* 170*    Recent Results (from the past 240 hour(s))  CULTURE, BLOOD (ROUTINE X 2)     Status: None   Collection Time    12/31/13  6:35 PM      Result Value Ref Range Status   Specimen Description BLOOD RIGHT FOREARM  4 ML IN [facility] BOTTLE   Final   Special Requests NONE   Final   Culture  Setup Time     Final   Value: 01/01/2014 01:04     Performed at [facility]   Culture     Final   Value:        BLOOD CULTURE RECEIVED NO GROWTH TO DATE CULTURE WILL BE HELD FOR 5 DAYS BEFORE ISSUING A FINAL NEGATIVE REPORT     Performed at [facility]   Report Status PENDING   Incomplete  CULTURE, BLOOD (ROUTINE X 2)     Status: None   Collection Time    12/31/13  7:21 PM      Result Value Ref Range Status   Specimen Description BLOOD RIGHT ARM  4 ML IN [facility] BOTTLE   Final   Special Requests NONE   Final   Culture  Setup Time     Final   Value: 01/01/2014 01:02     Performed at [facility]   Culture     Final   Value:        BLOOD CULTURE RECEIVED NO GROWTH TO DATE CULTURE WILL BE HELD FOR 5 DAYS BEFORE ISSUING A FINAL NEGATIVE REPORT     Performed at [facility]   Report Status PENDING   Incomplete  URINE CULTURE     Status: None   Collection Time    01/01/14  5:31 PM      Result Value Ref Range Status   Specimen Description URINE, CATHETERIZED   Final   Special Requests Immunocompromised   Final   Culture  Setup Time     Final   Value: 01/02/2014 02:12     Performed at [facility]     Final   Value: NO GROWTH  Performed at [facility]     Final   Value: NO GROWTH     Performed at [facility]   Report Status 01/03/2014 FINAL   Final  CULTURE, EXPECTORATED SPUTUM-ASSESSMENT     Status: None   Collection Time    01/03/14 11:30 AM      Result Value Ref Range Status   Specimen Description SPUTUM   Final   Special Requests NONE   Final   Sputum evaluation     Final   Value: THIS SPECIMEN IS ACCEPTABLE. RESPIRATORY CULTURE REPORT TO FOLLOW.   Report Status 01/03/2014 FINAL   Final  CULTURE, RESPIRATORY (NON-EXPECTORATED)     Status: None   Collection Time    01/03/14 11:30 AM      Result Value Ref Range Status   Specimen Description SPUTUM   Final   Special Requests NONE   Final   Gram Stain     Final   Value: NO WBC SEEN     NO SQUAMOUS EPITHELIAL CELLS SEEN     NO ORGANISMS SEEN     Performed at [facility]   Culture     Final   Value: NORMAL OROPHARYNGEAL FLORA     Performed at [facility]   Report Status 01/05/2014 FINAL   Final  BODY FLUID CULTURE     Status: None   Collection Time    01/03/14  3:34 PM      Result Value Ref Range Status   Specimen Description PLEURAL   Final   Special Requests Normal   Final   Gram Stain     Final   Value: RARE WBC PRESENT,BOTH PMN AND MONONUCLEAR     NO ORGANISMS SEEN     Performed at [facility]   Culture     Final   Value: NO GROWTH 2 DAYS     Performed at [facility]   Report Status PENDING   Incomplete     Scheduled Meds: . albuterol  2.5 mg Nebulization QID  . carvedilol  6.25 mg Oral BID WC  . cilostazol  50 mg Oral BID  . famotidine  10 mg Oral Daily  . feeding supplement (ENSURE)  1 Container Oral TID BM  . insulin aspart  0-9 Units Subcutaneous TID WC  . loratadine  10 mg Oral Daily  . metoprolol  2.5 mg Intravenous Once  . piperacillin-tazobactam (ZOSYN)  IV  3.375 g Intravenous Q8H  . simvastatin  40 mg Oral QPM  . tiotropium  18 mcg Inhalation Daily  . vancomycin  750 mg Intravenous  BID   Continuous Infusions: . dextrose 5 % and 0.45 % NaCl with KCl 20 mEq/L 50 mL/hr (01/03/14 1135)     Theodis Blaze, MD  [facility] Pager [phone]  If 7PM-7AM, please contact night-coverage [email or web address] Password [email or web address] 01/05/2014, 6:42 PM   LOS: 5 days

## 2014-01-06 LAB — BASIC METABOLIC PANEL
BUN: 20 mg/dL (ref 6–23)
CO2: 29 meq/L (ref 19–32)
CREATININE: 0.78 mg/dL (ref 0.50–1.35)
Calcium: 8.2 mg/dL — ABNORMAL LOW (ref 8.4–10.5)
Chloride: 101 mEq/L (ref 96–112)
GFR calc Af Amer: >90 mL/min (ref >90)
GFR calc non Af Amer: 81 mL/min — ABNORMAL LOW (ref >90)
Glucose, Bld: 203 mg/dL — ABNORMAL HIGH (ref 70–99)
Potassium: 4.1 mEq/L (ref 3.7–5.3)
SODIUM: 137 meq/L (ref 137–147)

## 2014-01-06 LAB — CBC
HCT: 24.3 % — ABNORMAL LOW (ref 39.0–52.0)
Hemoglobin: 8.1 g/dL — ABNORMAL LOW (ref 13.0–17.0)
MCH: 33.8 pg (ref 26.0–34.0)
MCHC: 33.3 g/dL (ref 30.0–36.0)
MCV: 101.3 fL — AB (ref 78.0–100.0)
PLATELETS: 264 10*3/uL (ref 150–400)
RBC: 2.4 MIL/uL — AB (ref 4.22–5.81)
RDW: 14 % (ref 11.5–15.5)
WBC: 9.5 10*3/uL (ref 4.0–10.5)

## 2014-01-06 LAB — GLUCOSE, CAPILLARY
GLUCOSE-CAPILLARY: 151 mg/dL — AB (ref 70–99)
GLUCOSE-CAPILLARY: 170 mg/dL — AB (ref 70–99)

## 2014-01-06 LAB — PROCALCITONIN: Procalcitonin: <0.1 ng/mL

## 2014-01-06 MED ORDER — TRAMADOL HCL 50 MG PO TABS: 50.0000 mg | ORAL_TABLET | Freq: Four times a day (QID) | ORAL | Status: DC | PRN

## 2014-01-06 MED ORDER — LEVOFLOXACIN 500 MG PO TABS: 500.0000 mg | ORAL_TABLET | Freq: Every day | ORAL | Status: DC

## 2014-01-06 MED ORDER — ENSURE PUDDING PO PUDG: 1.0000 | Freq: Three times a day (TID) | ORAL | Status: DC

## 2014-01-06 MED ORDER — ALBUTEROL SULFATE (2.5 MG/3ML) 0.083% IN NEBU: 2.5000 mg | INHALATION_SOLUTION | Freq: Four times a day (QID) | RESPIRATORY_TRACT | Status: DC

## 2014-01-06 MED ORDER — SODIUM CHLORIDE 0.9 % IJ SOLN
3.0000 mL | INTRAMUSCULAR | Status: DC | PRN
  Administered 2014-01-06: 3 mL via INTRAVENOUS

## 2014-01-06 NOTE — Progress Notes (Signed)
Patient is cleared for discharge. Packet copied and placed in [place]. Patient and patient's girlfriend are aware and agreeable. ptar called for transportation.  Stephano Arrants C. [place] MSW, [place]

## 2014-01-06 NOTE — Discharge Instructions (Signed)

## 2014-01-06 NOTE — Progress Notes (Signed)
Report given to [place], Therapist, sports at [employer]. Patient ready for discharge, transportation has been called. Will continue to monitor patient. J.Olisa Quesnel, RN

## 2014-01-06 NOTE — Discharge Summary (Signed)
Physician Discharge Summary  [name] [facility] [id number] DOB: 1928/08/31 DOA: 12/31/2013  PCP: [name], MD  Admit date: 12/31/2013 Discharge date: 01/06/2014  Recommendations for Outpatient Follow-up:  1. Pt will need to follow up with PCP in 2 weeks post discharge 2. Please obtain BMP to evaluate electrolytes and kidney function 3. Please also check CBC to evaluate Hg and Hct levels, check in one week 4. Please note that Xarelto was held during the hospital stay due to to acute blood loss anemia, heme positive stool 5. GI specialist evaluated the patient, patient needs to see GI specialist after discharge to determine if there is a need for colonoscopy 6. Please check hemoglobin in one week post discharge as noted above, if hemoglobin remains stable consider trial of restarting Xarelto 7. Please also note that Norvasc was stopped due to soft blood pressure while in hospital, this could be resumed if indicated in the future 8. Please also note that patient was discharged on levofloxacin to complete therapy for 5 more days post discharge 9. Please note that patient is scheduled to see pulmonologist Dr. Chase Caller at [facility] pulmonary clinic on 02/07/2014 at 9:45 am  Discharge Diagnoses:  Active Problems:   HTN (hypertension)   Diabetes mellitus, type 2   COPD (chronic obstructive pulmonary disease)   Atrial fibrillation   Pneumonia   Pleural effusion, left   UTI (lower urinary tract infection)   HCAP (healthcare-associated pneumonia)   Aspiration pneumonia   Acute blood loss anemia   Protein-calorie malnutrition, severe  Discharge Condition: Stable  Diet recommendation: Dysphagia 3 diet  Brief narrative:  80 y.o. male dysphagia / TIA and recurrent episodes of PNA treated as an outpatient by his PCP, followed by noted pleural effusion that was tapped on 4/3 (1.2 L removed) showing reactive mesothelial cells. CT chest done after demonstrated this effusion and LLL / atx/ PNA and  concern for aspiration in left lower lobe. Admitted this time with progressively worsening shortness of breath. CXR taken reveals about same effusion at the left base.   PNA: Presumed to be from aspiration pneumonia.  - SLP evaluated, dys 3 diet recommended  - pt is clinically stable this am, reports he feels better, denies shortness of breath  - Continue Levaquin by mouth for 5 more days post discharge - appreciate PCCM input  HTN (hypertension)  - Pt on beta blocker and blood pressure relatively well controlled  - Will continue to hold Norvasc upon discharge Severe malnutrition  - nutrition consulted and dysphagia 3 diet recommended  - Patient tolerating well, denies nausea History of CVA  - last year, stable neurologic exam  Diabetes mellitus, type 2  - Continue metformin upon discharge COPD (chronic obstructive pulmonary disease)  - Compensated currently, continue bronchodilators  Atrial fibrillation  - Rate controlled on carvedilol  - Xarelto on hold secondary to anemia with positive Hemoccult  - Please recheck hemoglobin and hematocrit in one week post discharge, if hemoglobin remains stable, patient has no new bleeding, consider trial of restarting Xarelto Anemia with positive Hemoccult  - Continue holding xarelto  - Hb 7.7 >> 8.4 >> 7.8 >> 8.1 this AM. And stable over 24 hours, GI signed off 4/13, will followup as an outpatient to determine need for colonoscopy - Please note recommendations on restarting xarelto Pleural effusion, left  - Per PCCM this is most likely exudative effusion secondary to left lower lobe atelectasis from chronic aspiration, recent rib fracture - Patient is also very deconditioned from COPD -  Per pulmonologist, patient will need bronc, this will be addressed upon followup in clinic with Dr. Chase Caller UTI (lower urinary tract infection)  - Levaquin should be adequate in coverage   Discharge Exam: Filed Vitals:   01/06/14 0513  BP: 95/50  Pulse: 92   Temp: 97.6 F (36.4 C)  Resp: 20   Filed Vitals:   01/05/14 2059 01/05/14 2125 01/06/14 0513 01/06/14 0725  BP:  110/62 95/50   Pulse:  84 92   Temp:  98.1 F (36.7 C) 97.6 F (36.4 C)   TempSrc:  Oral Oral   Resp:  20 20   Height:      Weight:      SpO2: 97% 99% 100% 98%    General: Pt is alert, follows commands appropriately, not in acute distress Cardiovascular: Regular rate and rhythm, S1/S2 +, no murmurs, no rubs, no gallops Respiratory: Clear to auscultation bilaterally, no wheezing, rhonchi abdomen is lower base Abdominal: Soft, non tender, non distended, bowel sounds +, no guarding Extremities: no edema, no cyanosis, pulses palpable bilaterally DP and PT  Discharge Instructions  Discharge Orders   Future Appointments Provider Department Dept Phone   02/07/2014 9:45 AM Brand Males, MD [facility] [phone]   Future Orders Complete By Expires   Diet - low sodium heart healthy  As directed    Increase activity slowly  As directed        Medication List    STOP taking these medications       amLODipine 5 MG tablet  Commonly known as:  NORVASC     XARELTO 20 MG Tabs tablet  Generic drug:  rivaroxaban      TAKE these medications       acetaminophen 500 MG tablet  Commonly known as:  TYLENOL  Take 1,000 mg by mouth every 6 (six) hours as needed for mild pain.     albuterol 108 (90 BASE) MCG/ACT inhaler  Commonly known as:  PROVENTIL HFA;VENTOLIN HFA  Inhale 2 puffs into the lungs every 6 (six) hours as needed for wheezing or shortness of breath.     albuterol (2.5 MG/3ML) 0.083% nebulizer solution  Commonly known as:  PROVENTIL  Take 3 mLs (2.5 mg total) by nebulization 4 (four) times daily.     budesonide 0.5 MG/2ML nebulizer solution  Commonly known as:  PULMICORT  Take 0.5 mg by nebulization 2 (two) times daily as needed (for wheezing/shortness of breath).     carvedilol 6.25 MG tablet  Commonly known as:  COREG  Take 6.25 mg  by mouth 2 (two) times daily with a meal.     cilostazol 50 MG tablet  Commonly known as:  PLETAL  Take 50 mg by mouth 2 (two) times daily.     feeding supplement (ENSURE) Pudg  Take 1 Container by mouth 3 (three) times daily between meals.     levofloxacin 500 MG tablet  Commonly known as:  LEVAQUIN  Take 1 tablet (500 mg total) by mouth daily.     loratadine 10 MG tablet  Commonly known as:  CLARITIN  Take 10 mg by mouth daily.     magnesium oxide 400 MG tablet  Commonly known as:  MAG-OX  Take 200 mg by mouth daily.     Melatonin 3 MG Tabs  Take 3 mg by mouth at bedtime.     metFORMIN 1000 MG tablet  Commonly known as:  GLUCOPHAGE  Take 500 mg by mouth 2 (two) times daily with  a meal.     methocarbamol 500 MG tablet  Commonly known as:  ROBAXIN  Take 1 tablet (500 mg total) by mouth every 8 (eight) hours as needed for muscle spasms (or pain).     PRESERVISION AREDS PO  Take 2 capsules by mouth daily.     ranitidine 300 MG tablet  Commonly known as:  ZANTAC  Take 300 mg by mouth at bedtime.     simvastatin 40 MG tablet  Commonly known as:  ZOCOR  Take 40 mg by mouth every evening.     tiotropium 18 MCG inhalation capsule  Commonly known as:  SPIRIVA  Place 18 mcg into inhaler and inhale daily.     traMADol 50 MG tablet  Commonly known as:  ULTRAM  Take 1 tablet (50 mg total) by mouth every 6 (six) hours as needed for moderate pain.     vitamin B-12 1000 MCG tablet  Commonly known as:  CYANOCOBALAMIN  Take 1,000 mcg by mouth daily.           Follow-up Information   Schedule an appointment as soon as possible for a visit with [name], MD.   Specialty:  Internal Medicine   Contact information:   [address] [place] [address] [phone]       Schedule an appointment as soon as possible for a visit with Cleotis Nipper, MD.   Specialty:  Gastroenterology   Contact information:   [address], [place]  [place] [address] [phone]       Follow up with Faye Ramsay, MD. (As needed, If symptoms worsen, call my cell phone [phone])    Specialty:  Internal Medicine   Contact information:   [address] [place] [address] [phone]        The results of significant diagnostics from this hospitalization (including imaging, microbiology, ancillary and laboratory) are listed below for reference.     Microbiology: Recent Results (from the past 240 hour(s))  CULTURE, BLOOD (ROUTINE X 2)     Status: None   Collection Time    12/31/13  6:35 PM      Result Value Ref Range Status   Specimen Description BLOOD RIGHT FOREARM  4 ML IN [facility] BOTTLE   Final   Special Requests NONE   Final   Culture  Setup Time     Final   Value: 01/01/2014 01:04     Performed at [facility]   Culture     Final   Value:        BLOOD CULTURE RECEIVED NO GROWTH TO DATE CULTURE WILL BE HELD FOR 5 DAYS BEFORE ISSUING A FINAL NEGATIVE REPORT     Performed at [facility]   Report Status PENDING   Incomplete  CULTURE, BLOOD (ROUTINE X 2)     Status: None   Collection Time    12/31/13  7:21 PM      Result Value Ref Range Status   Specimen Description BLOOD RIGHT ARM  4 ML IN [facility] BOTTLE   Final   Special Requests NONE   Final   Culture  Setup Time     Final   Value: 01/01/2014 01:02     Performed at [facility]   Culture     Final   Value:        BLOOD CULTURE RECEIVED NO GROWTH TO DATE CULTURE WILL BE HELD FOR 5 DAYS BEFORE ISSUING A FINAL NEGATIVE  REPORT     Performed at [facility]   Report Status PENDING   Incomplete  URINE CULTURE     Status: None   Collection Time    01/01/14  5:31 PM      Result Value Ref Range Status   Specimen Description URINE, CATHETERIZED   Final   Special Requests Immunocompromised   Final   Culture  Setup Time     Final   Value: 01/02/2014 02:12     Performed at [facility] Count     Final   Value: NO  GROWTH     Performed at [facility]   Culture     Final   Value: NO GROWTH     Performed at [facility]   Report Status 01/03/2014 FINAL   Final  CULTURE, EXPECTORATED SPUTUM-ASSESSMENT     Status: None   Collection Time    01/03/14 11:30 AM      Result Value Ref Range Status   Specimen Description SPUTUM   Final   Special Requests NONE   Final   Sputum evaluation     Final   Value: THIS SPECIMEN IS ACCEPTABLE. RESPIRATORY CULTURE REPORT TO FOLLOW.   Report Status 01/03/2014 FINAL   Final  CULTURE, RESPIRATORY (NON-EXPECTORATED)     Status: None   Collection Time    01/03/14 11:30 AM      Result Value Ref Range Status   Specimen Description SPUTUM   Final   Special Requests NONE   Final   Gram Stain     Final   Value: NO WBC SEEN     NO SQUAMOUS EPITHELIAL CELLS SEEN     NO ORGANISMS SEEN     Performed at [facility]   Culture     Final   Value: NORMAL OROPHARYNGEAL FLORA     Performed at [facility]   Report Status 01/05/2014 FINAL   Final  BODY FLUID CULTURE     Status: None   Collection Time    01/03/14  3:34 PM      Result Value Ref Range Status   Specimen Description PLEURAL   Final   Special Requests Normal   Final   Gram Stain     Final   Value: RARE WBC PRESENT,BOTH PMN AND MONONUCLEAR     NO ORGANISMS SEEN     Performed at [facility]   Culture     Final   Value: NO GROWTH 3 DAYS     Performed at [facility]   Report Status PENDING   Incomplete     Labs: Basic Metabolic Panel:  Recent Labs Lab 12/31/13 1835 01/01/14 0012 01/01/14 0557 01/03/14 0945 01/04/14 0445 01/05/14 0340 01/06/14 0350  NA 134*  --  137  --  138 139 137  K 5.3  --  4.3  --  4.4 4.5 4.1  CL 99  --  100  --  103 104 101  CO2 24  --  27  --  26 27 29   GLUCOSE 169*  --  157*  --  230* 188* 203*  BUN 40*  --  36*  --  27* 23 20  CREATININE 0.74  --  0.83 0.78 0.75 0.74 0.78  CALCIUM 8.8  --  9.0  --  8.2* 8.2* 8.2*  MG  --   1.7  --   --   --   --   --  Liver Function Tests:  Recent Labs Lab 12/31/13 1835 01/01/14 0557 01/04/14 0445  AST 54* 29  --   ALT 18 16  --   ALKPHOS 65 76  --   BILITOT 0.7 0.9  --   PROT 6.2 6.1  --   ALBUMIN 2.9* 2.8* 2.2*   No results found for this basename: LIPASE, AMYLASE,  in the last 168 hours No results found for this basename: AMMONIA,  in the last 168 hours CBC:  Recent Labs Lab 12/31/13 1835 01/02/14 0555 01/03/14 0945 01/04/14 0445 01/05/14 0340 01/06/14 0350  WBC 13.9* 7.2 6.9 6.8 7.5 9.5  NEUTROABS 9.6*  --   --   --   --   --   HGB 8.4* 7.7* 8.4* 7.8* 7.8* 8.1*  HCT 23.0* 22.7* 25.0* 24.0* 24.3* 24.3*  MCV 94.7 99.1 100.8* 101.3* 100.8* 101.3*  PLT 283 218 254 224 263 264   Cardiac Enzymes: No results found for this basename: CKTOTAL, CKMB, CKMBINDEX, TROPONINI,  in the last 168 hours BNP: BNP (last 3 results) No results found for this basename: PROBNP,  in the last 8760 hours CBG:  Recent Labs Lab 01/05/14 1153 01/05/14 1709 01/05/14 2123 01/06/14 0729 01/06/14 1115  GLUCAP 202* 170* 181* 151* 170*     SIGNED: Time coordinating discharge: Over 30 minutes  Theodis Blaze, MD  [facility] 01/06/2014, 11:30 AM Pager [phone]  If 7PM-7AM, please contact night-coverage [email or web address] Password [email or web address]

## 2014-01-07 ENCOUNTER — Ambulatory Visit: Payer: Medicare Other | Admitting: Occupational Therapy

## 2014-01-07 ENCOUNTER — Ambulatory Visit: Payer: Medicare Other

## 2014-01-07 LAB — CULTURE, BLOOD (ROUTINE X 2)
CULTURE: NO GROWTH
Culture: NO GROWTH

## 2014-01-07 LAB — BODY FLUID CULTURE
CULTURE: NO GROWTH
SPECIAL REQUESTS: NORMAL

## 2014-02-07 ENCOUNTER — Ambulatory Visit (INDEPENDENT_AMBULATORY_CARE_PROVIDER_SITE_OTHER): Payer: Medicare Other | Admitting: Internal Medicine

## 2014-02-07 ENCOUNTER — Encounter: Payer: Self-pay | Admitting: Internal Medicine

## 2014-02-07 ENCOUNTER — Ambulatory Visit (INDEPENDENT_AMBULATORY_CARE_PROVIDER_SITE_OTHER)
Admission: RE | Admit: 2014-02-07 | Discharge: 2014-02-07 | Disposition: A | Discharge status: Home / Self Care | Payer: Medicare Other | Source: Ambulatory Visit | Attending: Internal Medicine | Admitting: Internal Medicine

## 2014-02-07 VITALS — BP 132/72 | HR 87 | Ht 67.0 in | Wt 142.0 lb

## 2014-02-07 DIAGNOSIS — J189 Pneumonia, unspecified organism: Secondary | ICD-10-CM

## 2014-02-07 DIAGNOSIS — J90 Pleural effusion, not elsewhere classified: Secondary | ICD-10-CM

## 2014-02-07 NOTE — Patient Instructions (Addendum)
Do chest xray 1 view today; will call with results Might need CT chest depending on results REturn to see my NP in 8 weeks   ............. Update: due to recurrent L pleural effusion that is indeterminate and him being too frail for bronch to evaluate left lower lobe, will refer him to CVTS for consideration of VATS v pleurX v monitoring

## 2014-02-07 NOTE — Progress Notes (Signed)
Subjective:    Patient ID: [name], male    DOB: [birth year], 80 y.o.   MRN: [id number]  HPI  IOV 12/17/2013  Chief Complaint  Patient presents with  . Pulmonary Consult    Referred by [name] for PNA , COPD and trouble swollowing    80 year old male accompanied by his wife. He has multiple medical problems including macular degeneration, para arterial disease, hypertension, diabetes, atrial fibrillation. In October 2014 he developed a stroke despite being therapeutic on Coumadin. He was subsequent he switched to xarelto. Note, around the time of the stroke he probably fell down and sustained some rib fractures Since his stroke he has recovered and is able to do activities of daily living at a basic level except for one-sided weakness with residual left-sided mild hemiplegia. However, in February 2015 he was admitted for 5 days for new onset left lower lobe pneumonia [confirmed on CT scan of the chest February 2015]. It was diagnosed as a community-acquired pneumonia and treated as such. During this time he did have some anemia for which he was Hemoccult positive. He underwent an endoscopy by [facility] gastroenterology services and this was normal. It appears that at the time of followup with [name] his primary care physician this left lower lobe density has persisted along with pleural effusion and therefore he is been referred here  He and his wife are completely unsure why they're here today but they do admit to dysphagia and some mild chronic cough with white sputum. The dysphagia is new since the stroke. They're not sure if she did have a swallow evaluation but they think there might of had one while an inpatient and this was normal. That cough and white sputum are new since his discharge from pneumonia in early February 2015   Body mass index is 20.81 kg/(m^2).   reports that he quit smoking about 32 years ago. His smoking use included Cigarettes. He has a 105 pack-year smoking  history. He has never used smokeless tobacco.   Echocardiogram October 2014 shows cor pulmonale  REC #Dysphagia  - refer back to [facility] GI for dysphagia evaluation; concern this might cause of LLL pneumonia  #Left pleural effusion  - IR guided thoracentesis  By ultrasound - The fluid removal will be done by Interventional Radiology  - stop xarelto 48 hours prior to procedure and resume 24h after procedure   - they will remove not more than 1.5L  - fluid labs to be sent: cell count, gram stain and culture, cytology for malignant cells, chemistries for LDH, albumin,  Protein, glucose, triglyceride and lipase  - blood labs to be sent on same day / time: cbc, ldh, protein, albumin glucose, and lipase  #left sided pneumonia (updated plan 12/21/2013)  - will get CT chest after thora (currently scheduled pre-thora)  #Followup  - after thoracentesis with myself or NP Kaufman - APril 2015  [name] is a 80 y.o. male dysphagia / TIA and have had recurrent episodes of PNA treated as out patient by his PCP this was followed by noted pleural effusion that was tapped on 4/3/1 showing reactive mesothelial cells 1.2 liters after CT demonstrated this effusion and LLL / atx/ PNA and concern aspiration in left lower lobe. Admitted now 4/8 leg pain then SOB. Pcxr taken reveals about same effusion left base on this admission. Consulted now for concern need bronch and effusion noted. No distress, no hemoptysis.    4/3 Ct  chest - The left pleural effusion has not significantly changed in volume (THIS WAS DONE POST THORA) compared with the CT performed approximately 2 months ago.Progressive left lower lobe collapse without clear etiology. There is high-density particular matter within the collapsed left ower lobe, suggesting chronic aspiration. Depending on the results of thoracentesis, bronchoscopy may be warranted to exclude a centrally obstructing lesion. Increased patchy airspace  disease at the right lung base,possibly atelectasis or aspiration. Several subacute left-sided rib fractures again noted, without significant change.Extensive atherosclerosis.   4/3 - US guided thora 1.2 liters, 632 WBC 56 %lymph, prot ratio estimate 0.64 = exudative, no ldh   4/14: Pl LDH 70/Serum LDH 152: 46%, /Serum Alb 2.2gm% - Pl Albumin 1.7 = 0.5gm% . -> LDH suggests transudate. Albumin gradient suggests exudate. Ctyology non diagnostic mesothelial cells 1.3L removed. Wife gives hx of left sided rib fracture several months ago and problems of effusion post date this   Dysphagia  recurrent Likely exudative effusion likely secondary to left lower lobe atx from chronic aspiration  Aspiration PNA with noncompliance diet  COPD  - still very deconditioned.. Failed swallow on D3 diet per [facility]  - effusion is transudate v exudate; unclear because of mixed signals on LDH and protien/albumin studies especially he is not on lasix  - Definitely Related to LLL collapse and/or recent rib fracture  - Etiology for LLL collapse - ? Chronic aspiration v endobronchial lesion    PLAN  - strict asp diet  -chest pt to move secretions, flutter, IS  - needs bronch but unlikely will handle moderate sedation; will address in office at followup - appt given to see me at [facility] 02/07/14 9.45am monday  - Current fluid and abx per [facility]          OV 02/07/2014  Chief Complaint  Patient presents with  . Hospitalization Follow-up    C/o SOB with exertion and exercize.  Prod cough with white mucous.     Followup  For left sided pleural effusion associated with chronic left lower lobe atelectasis. Presumed etiology is aspiration versus trauma to left chest. FLuid is indeterminate for exudate v transudate.  Too frail to have a bronchoscopy.  Since his last visit he got hospitalized. His effusion was analyzed 2 times my knowledge but it was nondiagnostic. Likely transudate. Since then he has been  living in a SNIF facility. He is an extremely poor historian. The person who has accompanied him has been knowledge about him. But according to the companion who works in the [employer] facility she thinks he has been losing weight unspecified. Patient himself says he is doing okay he he did see [facility]ology on 02/01/2014. I reviewed those notes. He has no recollection of meeting them. According to their notes his dysphagia seems to have improved and they are not planning a PEG tube and underwent a follow him expectantly. Currently is on oxygen, sitting in a wheelchair. It is unknown what his physical and social status in the nursing home is   Dg Chest 1 View  02/07/2014   CLINICAL DATA:  Left pleural effusion followup.  EXAM: CHEST - 1 VIEW  COMPARISON:  01/03/2014  FINDINGS: Examination demonstrates worsening of a moderate size left pleural effusion likely with associated atelectasis. There is a stable to slightly worse small right pleural effusion. Mild stable cardiomegaly. Calcified plaque over the aortic arch. Remainder the exam is unchanged.  IMPRESSION: Worsening moderate size left pleural effusion likely with associated basilar atelectasis. Stable  to slightly worse small right pleural fluid collection.  Stable cardiomegaly.   Electronically Signed   By: Marin Olp M.D.   On: 02/07/2014 10:52       . Review of Systems  Constitutional: Negative for fever and unexpected weight change.  HENT: Positive for congestion and postnasal drip. Negative for dental problem, ear pain, nosebleeds, rhinorrhea, sinus pressure, sneezing, sore throat and trouble swallowing.   Eyes: Negative for redness and itching.  Respiratory: Positive for cough and shortness of breath. Negative for chest tightness and wheezing.   Cardiovascular: Negative for palpitations and leg swelling.  Gastrointestinal: Negative for nausea and vomiting.  Genitourinary: Negative for dysuria.  Musculoskeletal: Negative for joint  swelling.  Skin: Negative for rash.  Neurological: Negative for headaches.  Hematological: Does not bruise/bleed easily.  Psychiatric/Behavioral: Negative for dysphoric mood. The patient is not nervous/anxious.        Objective:   Physical Exam  Nursing note and vitals reviewed. Constitutional: He is oriented to person, place, and time. He appears well-developed and well-nourished. No distress.  Frail male Sitting in wheel chair  HENT:  Head: Normocephalic and atraumatic.  Right Ear: External ear normal.  Left Ear: External ear normal.  Mouth/Throat: Oropharynx is clear and moist. No oropharyngeal exudate.  o2 on  Eyes: Conjunctivae and EOM are normal. Pupils are equal, round, and reactive to light. Right eye exhibits no discharge. Left eye exhibits no discharge. No scleral icterus.  Neck: Normal range of motion. Neck supple. No JVD present. No tracheal deviation present. No thyromegaly present.  Cardiovascular: Normal rate, regular rhythm and intact distal pulses.  Exam reveals no gallop and no friction rub.   No murmur heard. Pulmonary/Chest: Effort normal and breath sounds normal. No respiratory distress. He has no wheezes. He has no rales. He exhibits no tenderness.  Abdominal: Soft. Bowel sounds are normal. He exhibits no distension and no mass. There is no tenderness. There is no rebound and no guarding.  Musculoskeletal: Normal range of motion. He exhibits no edema and no tenderness.  Sitting in wheel chair  Lymphadenopathy:    He has no cervical adenopathy.  Neurological: He is alert and oriented to person, place, and time. He has normal reflexes. No cranial nerve deficit. Coordination normal.  Skin: Skin is warm and dry. No rash noted. He is not diaphoretic. No erythema. No pallor.  Psychiatric:  Very poor historian          Assessment & Plan:

## 2014-02-08 ENCOUNTER — Telehealth: Payer: Self-pay | Admitting: Internal Medicine

## 2014-02-08 DIAGNOSIS — J90 Pleural effusion, not elsewhere classified: Secondary | ICD-10-CM

## 2014-02-08 NOTE — Assessment & Plan Note (Signed)
Due to recurrent L pleural effusion that is indeterminate and him being too frail for bronch to evaluate left lower lobe, will refer him to CVTS for consideration of VATS v pleurX v monitoring

## 2014-02-08 NOTE — Telephone Encounter (Signed)
Patient from SNF. Seen with [name] 02/07/14. CXR shows persistent/recurrent left pleural effusion. Please refer him to CVTS for evaluation of VATS v pleurX  Thanks  Dr. Brand Males, M.D., [facility].C.P Pulmonary and Critical Care Medicine Staff Physician [facility] Pager: [phone], If no answer or between  15:00h - 7:00h: call [phone]  02/08/2014 7:21 AM

## 2014-02-10 ENCOUNTER — Telehealth: Payer: Self-pay | Admitting: Internal Medicine

## 2014-02-10 NOTE — Telephone Encounter (Signed)
Referral was just placed this am  I called and spoke with Mable and notified her of this and she verbalized understanding  I advised someone should contact them soon  Nothing further needed

## 2014-02-10 NOTE — Telephone Encounter (Signed)
Called and spoke to pt's wife, Mable, and informed her of the recs made by MR. Mable informed me pt is at [place] facility. Order has been placed with the acknowledgment of the pt's residence. Mable verbalized understanding and denied any further questions or concerns at this time.

## 2014-02-16 ENCOUNTER — Institutional Professional Consult (permissible substitution) (INDEPENDENT_AMBULATORY_CARE_PROVIDER_SITE_OTHER): Payer: Medicare Other | Admitting: Cardiothoracic Surgery

## 2014-02-16 ENCOUNTER — Encounter: Payer: Self-pay | Admitting: Cardiothoracic Surgery

## 2014-02-16 ENCOUNTER — Other Ambulatory Visit: Payer: Self-pay | Admitting: *Deleted

## 2014-02-16 VITALS — BP 148/91 | HR 88 | Resp 20 | Ht 71.0 in | Wt 145.0 lb

## 2014-02-16 DIAGNOSIS — J90 Pleural effusion, not elsewhere classified: Secondary | ICD-10-CM

## 2014-02-16 NOTE — Progress Notes (Signed)
PCP is [name], MD Referring Provider is Brand Males, MD  Chief Complaint  Patient presents with  . Pleural Effusion    Surgical eval for possible VATS v/s PleurX placement   patient examined, most recent chest CT and chest radiograph reviewed 2-D echocardiogram from previous hospitalization reviewed  HPI: 80 year old Caucasian male diabetic ex-smoker presents for evaluation of a recurrent left pleural effusion which has required thoracentesis twice over the past 2 months. The patient had a significant decline in overall health since the winter when he developed a stroke from atrial fibrillation. He developed left-sided weakness and was started on Xarelto. A 2-D echocardiogram showed good LV function, good RV function, and moderate TR with moderate pulmonary hypertension. He was discharged to home but fell in March and sustained some left rib fractures. He then developed pneumonia on the left followed by pleural effusion, recurrent which required thoracentesis on 2 separate occasions. Each pleural cath removed over liter of fluid-cytology negative. The patient has been in a skilled nursing facility the past month and now is transitioning home. He is still a wheelchair on home oxygen. He was seen by pulmonary as an outpatient and recommended possible Pleurx catheter versus VATS for his recurrent effusion. They did not feel he is strong enough for bronchoscopy. The patient's last chest x-ray shows a moderate left pleural effusion. The patient's wife states he gets short of breath with minimal exertion and is having difficulty walking. He denies orthopnea or ankle edema.   Past Medical History  Diagnosis Date  . Diabetes mellitus without complication   . Dysrhythmia     atrial fibrilation  . Hypertension   . Shortness of breath   . Peripheral vascular disease   . Claudication of calf muscles 11/05/2012    right calf  . GERD (gastroesophageal reflux disease)   . Arthritis   . TIA  (transient ischemic attack) 2010    Past Surgical History  Procedure Laterality Date  . Foot surgery      foot surgery due to broken foot years ago  . Esophagogastroduodenoscopy N/A 11/02/2013    Procedure: ESOPHAGOGASTRODUODENOSCOPY (EGD);  Surgeon: Cleotis Nipper, MD;  Location: [facility] ENDOSCOPY;  Service: Endoscopy;  Laterality: N/A;    Family History  Problem Relation Age of Onset  . Cancer Neg Hx   . CAD Neg Hx     Social History History  Substance Use Topics  . Smoking status: Former Smoker -- 3.00 packs/day for 35 years    Types: Cigarettes    Quit date: 09/23/1981  . Smokeless tobacco: Never Used  . Alcohol Use: No     Comment: QUITS YEARS AGO    Current Outpatient Prescriptions  Medication Sig Dispense Refill  . acetaminophen (TYLENOL) 500 MG tablet Take 1,000 mg by mouth every 6 (six) hours as needed for mild pain.      Marland Kitchen albuterol (PROVENTIL) (2.5 MG/3ML) 0.083% nebulizer solution Take 3 mLs (2.5 mg total) by nebulization 4 (four) times daily.  75 mL  12  . budesonide (PULMICORT) 0.5 MG/2ML nebulizer solution Take 0.5 mg by nebulization 2 (two) times daily as needed (for wheezing/shortness of breath).       . carvedilol (COREG) 6.25 MG tablet Take 6.25 mg by mouth 2 (two) times daily with a meal.      . cilostazol (PLETAL) 50 MG tablet Take 50 mg by mouth 2 (two) times daily.      Marland Kitchen doxycycline (DORYX) 100 MG EC tablet Take 100 mg by mouth  2 (two) times daily.      . feeding supplement, ENSURE, (ENSURE) PUDG Take 1 Container by mouth 3 (three) times daily between meals.  90 Can  0  . loratadine (CLARITIN) 10 MG tablet Take 10 mg by mouth daily.      . magnesium oxide (MAG-OX) 400 MG tablet Take 200 mg by mouth daily.      . Melatonin 3 MG TABS Take 3 mg by mouth at bedtime.      . metFORMIN (GLUCOPHAGE) 1000 MG tablet Take 500 mg by mouth 2 (two) times daily with a meal.      . methocarbamol (ROBAXIN) 500 MG tablet Take 1 tablet (500 mg total) by mouth every 8 (eight)  hours as needed for muscle spasms (or pain).  20 tablet  0  . Multiple Vitamins-Minerals (PRESERVISION AREDS PO) Take 2 capsules by mouth daily.      . ranitidine (ZANTAC) 300 MG tablet Take 300 mg by mouth at bedtime.      . simvastatin (ZOCOR) 40 MG tablet Take 40 mg by mouth every evening.      . tiotropium (SPIRIVA) 18 MCG inhalation capsule Place 18 mcg into inhaler and inhale daily.      . traMADol (ULTRAM) 50 MG tablet Take 1 tablet (50 mg total) by mouth every 6 (six) hours as needed for moderate pain.  30 tablet  1  . vitamin B-12 (CYANOCOBALAMIN) 1000 MCG tablet Take 1,000 mcg by mouth daily.       No current facility-administered medications for this visit.    No Known Allergies  Review of Systems patient is on thickened liquids to prevent aspiration Home physical therapy has been set up for the patient as he transitions from the skilled nursing facility. His wife was told that he is walking 60 feet with a walker however the patient has had difficulty getting in and out of the automobile for his office visit on his way home from the SNF. The patient has lost 20 pounds since winter.  BP 148/91  Pulse 88  Resp 20  Ht 5\' 11"  (1.803 m)  Wt 145 lb (65.772 kg)  BMI 20.23 kg/m2  SpO2 97% Physical Exam Elderly chronically ill male Lanoxin in wheelchair HEENT poor dentition no JVD Lungs reduced breath sounds on the left Cardiac irregular rhythm consistent with atrial fibrillation Abdomen scaphoid nontender Extremities minimal edema, no palpable pedal pulses Neuro-week left upper extremity, mildly weak left lower extremity. Patient unable to walk without assistance x2 to move from exam table to wheelchair  Diagnostic Tests: CT scan chest x-ray is reviewed  Impression: Recurrent left pleural effusion probably multifactorial with recent pneumonia which has been treated, diastolic heart failure, atrial fibrillation, and probable poor nutrition-low protein.  Plan: Left Pleurx  catheter under local anesthesia in the hospital in approximately week after the Pascal Lux is stopped. The patient will start the aspirin when he stops the X a. inhibitor. The patient wife understand the risks of bleeding, postoperative pulmonary failure requiring intubation and ventilator, infection, and death. They agree to proceed with surgery. If the patient is not progress in the normal fashion after this minor procedure we will ask  the CCM team for assistance in postop care

## 2014-02-22 ENCOUNTER — Encounter (HOSPITAL_COMMUNITY): Payer: Self-pay

## 2014-02-22 ENCOUNTER — Encounter (HOSPITAL_COMMUNITY)
Admission: RE | Admit: 2014-02-22 | Discharge: 2014-02-22 | Disposition: A | Discharge status: Home / Self Care | Payer: Medicare Other | Source: Ambulatory Visit | Attending: Cardiothoracic Surgery | Admitting: Cardiothoracic Surgery

## 2014-02-22 VITALS — BP 107/70 | HR 80 | Temp 97.5°F | Resp 18 | Ht 71.0 in | Wt 136.9 lb

## 2014-02-22 DIAGNOSIS — J90 Pleural effusion, not elsewhere classified: Secondary | ICD-10-CM

## 2014-02-22 HISTORY — DX: Pneumonia, unspecified organism: J18.9

## 2014-02-22 HISTORY — DX: Cerebral infarction, unspecified: I63.9

## 2014-02-22 HISTORY — DX: Myoneural disorder, unspecified: G70.9

## 2014-02-22 HISTORY — DX: Dependence on supplemental oxygen: Z99.81

## 2014-02-22 LAB — CBC
HCT: 35.8 % — ABNORMAL LOW (ref 39.0–52.0)
Hemoglobin: 11.8 g/dL — ABNORMAL LOW (ref 13.0–17.0)
MCH: 31.7 pg (ref 26.0–34.0)
MCHC: 33 g/dL (ref 30.0–36.0)
MCV: 96.2 fL (ref 78.0–100.0)
Platelets: 236 10*3/uL (ref 150–400)
RBC: 3.72 MIL/uL — ABNORMAL LOW (ref 4.22–5.81)
RDW: 13.4 % (ref 11.5–15.5)
WBC: 5.9 10*3/uL (ref 4.0–10.5)

## 2014-02-22 LAB — COMPREHENSIVE METABOLIC PANEL
ALT: 9 U/L (ref 0–53)
AST: 16 U/L (ref 0–37)
Albumin: 3.7 g/dL (ref 3.5–5.2)
Alkaline Phosphatase: 102 U/L (ref 39–117)
BUN: 34 mg/dL — ABNORMAL HIGH (ref 6–23)
CO2: 27 mEq/L (ref 19–32)
Calcium: 9.9 mg/dL (ref 8.4–10.5)
Chloride: 104 mEq/L (ref 96–112)
Creatinine, Ser: 0.8 mg/dL (ref 0.50–1.35)
GFR calc Af Amer: >90 mL/min (ref >90)
GFR calc non Af Amer: 80 mL/min — ABNORMAL LOW (ref >90)
Glucose, Bld: 134 mg/dL — ABNORMAL HIGH (ref 70–99)
Potassium: 4.8 mEq/L (ref 3.7–5.3)
Sodium: 144 mEq/L (ref 137–147)
Total Bilirubin: 0.4 mg/dL (ref 0.3–1.2)
Total Protein: 7.6 g/dL (ref 6.0–8.3)

## 2014-02-22 LAB — SURGICAL PCR SCREEN
MRSA, PCR: NEGATIVE
Staphylococcus aureus: NEGATIVE

## 2014-02-22 LAB — APTT: aPTT: 31 seconds (ref 24–37)

## 2014-02-22 LAB — PROTIME-INR
INR: 1.1 (ref 0.00–1.49)
Prothrombin Time: 14 seconds (ref 11.6–15.2)

## 2014-02-22 NOTE — Progress Notes (Signed)
Per Ryan Brooks no T&S needed. 

## 2014-02-22 NOTE — Pre-Procedure Instructions (Signed)
[name]  02/22/2014   Your procedure is scheduled on:  Thursday  02/24/14  Report to [facility] Admitting at 730 AM.  Call this number if you have problems the morning of surgery: [phone]   Remember:   Do not eat food or drink liquids after midnight.   Take these medicines the morning of surgery with A SIP OF WATER: ULTRAM OR TYLENOL IF NEEDED, ALBUTEROL, PULMICORT, CARVEDILOL(COREG), SPIRIVA   Do not wear jewelry, make-up or nail polish.  Do not wear lotions, powders, or perfumes. You may wear deodorant.  Do not shave 48 hours prior to surgery. Men may shave face and neck.  Do not bring valuables to the hospital.  [facility] is not responsible                  for any belongings or valuables.               Contacts, dentures or bridgework may not be worn into surgery.  Leave suitcase in the car. After surgery it may be brought to your room.  For patients admitted to the hospital, discharge time is determined by your                treatment team.               Patients discharged the day of surgery will not be allowed to drive  home.  Name and phone number of your driver:   Special Instructions:  Special Instructions: [place] - Preparing for Surgery  Before surgery, you can play an important role.  Because skin is not sterile, your skin needs to be as free of germs as possible.  You can reduce the number of germs on you skin by washing with CHG (chlorahexidine gluconate) soap before surgery.  CHG is an antiseptic cleaner which kills germs and bonds with the skin to continue killing germs even after washing.  Please DO NOT use if you have an allergy to CHG or antibacterial soaps.  If your skin becomes reddened/irritated stop using the CHG and inform your nurse when you arrive at Short Stay.  Do not shave (including legs and underarms) for at least 48 hours prior to the first CHG shower.  You may shave your face.  Please follow these instructions  carefully:   1.  Shower with CHG Soap the night before surgery and the morning of Surgery.  2.  If you choose to wash your hair, wash your hair first as usual with your normal shampoo.  3.  After you shampoo, rinse your hair and body thoroughly to remove the Shampoo.  4.  Use CHG as you would any other liquid soap. You can apply chg directly to the skin and wash gently with scrungie or a clean washcloth.  5.  Apply the CHG Soap to your body ONLY FROM THE NECK DOWN.  Do not use on open wounds or open sores.  Avoid contact with your eyes, ears, mouth and genitals (private parts).  Wash genitals (private parts with your normal soap.  6.  Wash thoroughly, paying special attention to the area where your surgery will be performed.  7.  Thoroughly rinse your body with warm water from the neck down.  8.  DO NOT shower/wash with your normal soap after using and rinsing off the CHG Soap.  9.  Pat yourself dry with a clean towel.  10.  Wear clean pajamas.            11.  Place clean sheets on your bed the night of your first shower and do not sleep with pets.  Day of Surgery  Do not apply any lotions/deodorants the morning of surgery.  Please wear clean clothes to the hospital/surgery center.   Please read over the following fact sheets that you were given: Pain Booklet, Coughing and Deep Breathing, Blood Transfusion Information, Total Joint Packet, MRSA Information and Surgical Site Infection Prevention

## 2014-02-22 NOTE — Progress Notes (Addendum)
WHEN PATIENT WAS ASKED BASIC INFO REGARDING SURGERY HE WAS UNABLE TO VERBALIZE WHAT HE WAS HAVING DONE.  PATIENT IS NOT MARRIED AND DAUGHTER WITH HIM WAS AWARE OF SURGERY AND SIGNED CONSENT. CLARIFIED WITH RYAN RE: PATIENT WAS TOLD TO STOP ZARELTO AND TAKE 81 MG ASPIRIN BUT PATIENT WAS APPARENTLY CHANGED TO PLETAL AND FAMILY WAS UNAWARE (WAS CHANGED WHEN PT WAS STAYING IN [place].) ALSO NOTIFIED RYAN OF POSSIBLE INFECTED RIGHT  BIG TOE WITH YELLOW DRAINAGE AND REDNESS, SWELLING, SMALL OPEN AREA AT TOENAIL DAUGHTER SAID SHE NOTICED WHILE TRIMMING HIS TOENAILS)   RYAN STATED PATIENT SHOULD STOP PLETAL AND CONT. ASPIRIN.  RYAN WILL SPEAK TO DR. VANTRIGT RE TOE, AND FACT HE HAD NOT STOPPED PLETAL. rYAN WILL NOTIFIY FAMILY WITH ANY CHANGES.

## 2014-02-23 MED ORDER — DEXTROSE 5 % IV SOLN
1.5000 g | INTRAVENOUS | Status: AC
  Administered 2014-02-24: 1.5 g via INTRAVENOUS
  Filled 2014-02-23: qty 1.5

## 2014-02-23 NOTE — Progress Notes (Signed)
Spoke with pt and spouse to make aware of new arrival time of 8:30 AM, tomorrow, 02/24/14.

## 2014-02-24 ENCOUNTER — Ambulatory Visit (HOSPITAL_COMMUNITY): Payer: Medicare Other | Admitting: Anesthesiology

## 2014-02-24 ENCOUNTER — Encounter (HOSPITAL_COMMUNITY): Payer: Self-pay | Admitting: *Deleted

## 2014-02-24 ENCOUNTER — Encounter (HOSPITAL_COMMUNITY): Payer: Medicare Other | Admitting: Anesthesiology

## 2014-02-24 ENCOUNTER — Observation Stay (HOSPITAL_COMMUNITY)
Admission: RE | Admit: 2014-02-24 | Discharge: 2014-02-25 | Disposition: A | Discharge status: Home / Self Care | Payer: Medicare Other | Source: Ambulatory Visit | Attending: Cardiothoracic Surgery | Admitting: Cardiothoracic Surgery

## 2014-02-24 ENCOUNTER — Observation Stay (HOSPITAL_COMMUNITY): Payer: Medicare Other

## 2014-02-24 ENCOUNTER — Encounter (HOSPITAL_COMMUNITY)
Admission: RE | Disposition: A | Discharge status: Home / Self Care | Payer: Self-pay | Source: Ambulatory Visit | Attending: Cardiothoracic Surgery

## 2014-02-24 DIAGNOSIS — I10 Essential (primary) hypertension: Secondary | ICD-10-CM | POA: Insufficient documentation

## 2014-02-24 DIAGNOSIS — J90 Pleural effusion, not elsewhere classified: Secondary | ICD-10-CM

## 2014-02-24 DIAGNOSIS — E119 Type 2 diabetes mellitus without complications: Secondary | ICD-10-CM | POA: Insufficient documentation

## 2014-02-24 DIAGNOSIS — Z87891 Personal history of nicotine dependence: Secondary | ICD-10-CM | POA: Insufficient documentation

## 2014-02-24 HISTORY — DX: Chronic obstructive pulmonary disease, unspecified: J44.9

## 2014-02-24 HISTORY — DX: Unspecified macular degeneration: H35.30

## 2014-02-24 HISTORY — DX: Atherosclerotic heart disease of native coronary artery without angina pectoris: I25.10

## 2014-02-24 HISTORY — PX: CHEST TUBE INSERTION: SHX231

## 2014-02-24 HISTORY — DX: Type 2 diabetes mellitus without complications: E11.9

## 2014-02-24 LAB — GLUCOSE, CAPILLARY
Glucose-Capillary: 108 mg/dL — ABNORMAL HIGH (ref 70–99)
Glucose-Capillary: 108 mg/dL — ABNORMAL HIGH (ref 70–99)
Glucose-Capillary: 84 mg/dL (ref 70–99)
Glucose-Capillary: 78 mg/dL (ref 70–99)

## 2014-02-24 LAB — CBC
HCT: 39 % (ref 39.0–52.0)
Hemoglobin: 12.6 g/dL — ABNORMAL LOW (ref 13.0–17.0)
MCH: 31.7 pg (ref 26.0–34.0)
MCHC: 32.3 g/dL (ref 30.0–36.0)
MCV: 98 fL (ref 78.0–100.0)
Platelets: 206 10*3/uL (ref 150–400)
RBC: 3.98 MIL/uL — ABNORMAL LOW (ref 4.22–5.81)
RDW: 13.3 % (ref 11.5–15.5)
WBC: 5.3 10*3/uL (ref 4.0–10.5)

## 2014-02-24 LAB — CREATININE, SERUM
Creatinine, Ser: 0.71 mg/dL (ref 0.50–1.35)
GFR calc Af Amer: >90 mL/min (ref >90)
GFR calc non Af Amer: 84 mL/min — ABNORMAL LOW (ref >90)

## 2014-02-24 SURGERY — INSERTION, PLEURAL DRAINAGE CATHETER
Anesthesia: Monitor Anesthesia Care | Site: Chest | Laterality: Left

## 2014-02-24 MED ORDER — SIMVASTATIN 40 MG PO TABS
40.0000 mg | ORAL_TABLET | Freq: Every evening | ORAL | Status: DC
  Filled 2014-02-24 (×2): qty 1

## 2014-02-24 MED ORDER — PROPOFOL 10 MG/ML IV BOLUS
INTRAVENOUS | Status: AC
Start: 2014-02-24 — End: 2014-02-24
  Filled 2014-02-24: qty 20

## 2014-02-24 MED ORDER — PROPOFOL INFUSION 10 MG/ML OPTIME
INTRAVENOUS | Status: DC | PRN
  Administered 2014-02-24: 75 ug/kg/min via INTRAVENOUS

## 2014-02-24 MED ORDER — BUDESONIDE 0.5 MG/2ML IN SUSP
0.5000 mg | Freq: Two times a day (BID) | RESPIRATORY_TRACT | Status: DC | PRN
  Filled 2014-02-24: qty 2

## 2014-02-24 MED ORDER — MAGNESIUM OXIDE 400 (241.3 MG) MG PO TABS
200.0000 mg | ORAL_TABLET | Freq: Every day | ORAL | Status: DC
  Filled 2014-02-24: qty 0.5

## 2014-02-24 MED ORDER — ALBUTEROL SULFATE (2.5 MG/3ML) 0.083% IN NEBU
2.5000 mg | INHALATION_SOLUTION | Freq: Four times a day (QID) | RESPIRATORY_TRACT | Status: DC
  Administered 2014-02-24 – 2014-02-25 (×4): 2.5 mg via RESPIRATORY_TRACT
  Filled 2014-02-24 (×4): qty 3

## 2014-02-24 MED ORDER — ASPIRIN EC 81 MG PO TBEC
81.0000 mg | DELAYED_RELEASE_TABLET | Freq: Every day | ORAL | Status: DC
  Administered 2014-02-24 – 2014-02-25 (×2): 81 mg via ORAL
  Filled 2014-02-24 (×2): qty 1

## 2014-02-24 MED ORDER — ONDANSETRON HCL 4 MG/2ML IJ SOLN: 4.0000 mg | Freq: Once | INTRAMUSCULAR | Status: DC | PRN

## 2014-02-24 MED ORDER — LACTATED RINGERS IV SOLN
INTRAVENOUS | Status: DC | PRN
  Administered 2014-02-24: 12:00:00 via INTRAVENOUS

## 2014-02-24 MED ORDER — OXYCODONE HCL 5 MG/5ML PO SOLN: 5.0000 mg | Freq: Once | ORAL | Status: DC | PRN

## 2014-02-24 MED ORDER — TIOTROPIUM BROMIDE MONOHYDRATE 18 MCG IN CAPS
18.0000 ug | ORAL_CAPSULE | Freq: Every day | RESPIRATORY_TRACT | Status: DC
  Administered 2014-02-25: 18 ug via RESPIRATORY_TRACT
  Filled 2014-02-24: qty 5

## 2014-02-24 MED ORDER — LIDOCAINE HCL (CARDIAC) 20 MG/ML IV SOLN
INTRAVENOUS | Status: DC | PRN
  Administered 2014-02-24: 50 mg via INTRAVENOUS

## 2014-02-24 MED ORDER — MIDAZOLAM HCL 5 MG/5ML IJ SOLN
INTRAMUSCULAR | Status: DC | PRN
  Administered 2014-02-24 (×2): 1 mg via INTRAVENOUS

## 2014-02-24 MED ORDER — DOXYCYCLINE HYCLATE 100 MG PO TABS
100.0000 mg | ORAL_TABLET | Freq: Two times a day (BID) | ORAL | Status: DC
  Administered 2014-02-24 – 2014-02-25 (×2): 100 mg via ORAL
  Filled 2014-02-24 (×3): qty 1

## 2014-02-24 MED ORDER — DOXYCYCLINE HYCLATE 100 MG PO TBEC: 100.0000 mg | DELAYED_RELEASE_TABLET | Freq: Two times a day (BID) | ORAL | Status: DC

## 2014-02-24 MED ORDER — FENTANYL CITRATE 0.05 MG/ML IJ SOLN: 25.0000 ug | INTRAMUSCULAR | Status: DC | PRN

## 2014-02-24 MED ORDER — METFORMIN HCL 500 MG PO TABS
500.0000 mg | ORAL_TABLET | Freq: Two times a day (BID) | ORAL | Status: DC
  Administered 2014-02-24 – 2014-02-25 (×2): 500 mg via ORAL
  Filled 2014-02-24 (×4): qty 1

## 2014-02-24 MED ORDER — LORATADINE 10 MG PO TABS
10.0000 mg | ORAL_TABLET | Freq: Every day | ORAL | Status: DC
  Administered 2014-02-25: 10 mg via ORAL
  Filled 2014-02-24 (×2): qty 1

## 2014-02-24 MED ORDER — LIDOCAINE HCL 1 % IJ SOLN
INTRAMUSCULAR | Status: DC | PRN
  Administered 2014-02-24: 20 mL via INTRADERMAL

## 2014-02-24 MED ORDER — TRAMADOL HCL 50 MG PO TABS: 50.0000 mg | ORAL_TABLET | Freq: Four times a day (QID) | ORAL | Status: DC | PRN

## 2014-02-24 MED ORDER — ACETAMINOPHEN 325 MG PO TABS: 650.0000 mg | ORAL_TABLET | Freq: Four times a day (QID) | ORAL | Status: DC | PRN

## 2014-02-24 MED ORDER — FENTANYL CITRATE 0.05 MG/ML IJ SOLN
INTRAMUSCULAR | Status: AC
  Filled 2014-02-24: qty 5

## 2014-02-24 MED ORDER — VITAMIN B-12 1000 MCG PO TABS
1000.0000 ug | ORAL_TABLET | Freq: Every day | ORAL | Status: DC
  Administered 2014-02-25: 1000 ug via ORAL
  Filled 2014-02-24 (×2): qty 1

## 2014-02-24 MED ORDER — ENOXAPARIN SODIUM 30 MG/0.3ML ~~LOC~~ SOLN
30.0000 mg | SUBCUTANEOUS | Status: DC
  Administered 2014-02-24: 30 mg via SUBCUTANEOUS
  Filled 2014-02-24 (×2): qty 0.3

## 2014-02-24 MED ORDER — OXYCODONE HCL 5 MG PO TABS: 5.0000 mg | ORAL_TABLET | Freq: Once | ORAL | Status: DC | PRN

## 2014-02-24 MED ORDER — LIDOCAINE HCL (CARDIAC) 20 MG/ML IV SOLN
INTRAVENOUS | Status: AC
  Filled 2014-02-24: qty 5

## 2014-02-24 MED ORDER — FAMOTIDINE 20 MG PO TABS
20.0000 mg | ORAL_TABLET | Freq: Every day | ORAL | Status: DC
  Administered 2014-02-25: 20 mg via ORAL
  Filled 2014-02-24 (×2): qty 1

## 2014-02-24 MED ORDER — MIDAZOLAM HCL 2 MG/2ML IJ SOLN
INTRAMUSCULAR | Status: AC
  Filled 2014-02-24: qty 2

## 2014-02-24 MED ORDER — GUAIFENESIN-DM 100-10 MG/5ML PO SYRP: 5.0000 mL | ORAL_SOLUTION | ORAL | Status: DC | PRN

## 2014-02-24 MED ORDER — ELDERTONIC PO ELIX
15.0000 mL | ORAL_SOLUTION | Freq: Three times a day (TID) | ORAL | Status: DC
  Administered 2014-02-25 (×2): 15 mL via ORAL
  Filled 2014-02-24 (×5): qty 15

## 2014-02-24 MED ORDER — MAGNESIUM OXIDE 400 (241.3 MG) MG PO TABS
200.0000 mg | ORAL_TABLET | Freq: Every day | ORAL | Status: DC
  Administered 2014-02-25: 200 mg via ORAL
  Filled 2014-02-24: qty 0.5

## 2014-02-24 MED ORDER — CARVEDILOL 6.25 MG PO TABS
6.2500 mg | ORAL_TABLET | Freq: Two times a day (BID) | ORAL | Status: DC
  Administered 2014-02-24 – 2014-02-25 (×2): 6.25 mg via ORAL
  Filled 2014-02-24 (×4): qty 1

## 2014-02-24 MED ORDER — CILOSTAZOL 50 MG PO TABS
50.0000 mg | ORAL_TABLET | Freq: Two times a day (BID) | ORAL | Status: DC
  Administered 2014-02-24 – 2014-02-25 (×2): 50 mg via ORAL
  Filled 2014-02-24 (×3): qty 1

## 2014-02-24 MED ORDER — ACETAMINOPHEN 650 MG RE SUPP: 650.0000 mg | Freq: Four times a day (QID) | RECTAL | Status: DC | PRN

## 2014-02-24 MED ORDER — 0.9 % SODIUM CHLORIDE (POUR BTL) OPTIME
TOPICAL | Status: DC | PRN
  Administered 2014-02-24: 1000 mL

## 2014-02-24 MED ORDER — POTASSIUM CHLORIDE IN NACL 20-0.9 MEQ/L-% IV SOLN
INTRAVENOUS | Status: DC
  Administered 2014-02-24: 18:00:00 via INTRAVENOUS
  Filled 2014-02-24 (×2): qty 1000

## 2014-02-24 MED ORDER — LACTATED RINGERS IV SOLN
INTRAVENOUS | Status: DC
  Administered 2014-02-24: 10:00:00 via INTRAVENOUS

## 2014-02-24 MED ORDER — STARCH (THICKENING) PO POWD
ORAL | Status: DC | PRN
  Filled 2014-02-24: qty 227

## 2014-02-24 SURGICAL SUPPLY — 28 items
ADH SKN CLS APL DERMABOND .7 (GAUZE/BANDAGES/DRESSINGS) ×1
CANISTER SUCTION 2500CC (MISCELLANEOUS) ×3 IMPLANT
COVER SURGICAL LIGHT HANDLE (MISCELLANEOUS) ×3 IMPLANT
DERMABOND ADVANCED (GAUZE/BANDAGES/DRESSINGS) ×2
DERMABOND ADVANCED .7 DNX12 (GAUZE/BANDAGES/DRESSINGS) ×1 IMPLANT
DRAPE C-ARM 42X72 X-RAY (DRAPES) ×3 IMPLANT
DRAPE LAPAROSCOPIC ABDOMINAL (DRAPES) ×3 IMPLANT
GLOVE ORTHO TXT STRL SZ7.5 (GLOVE) ×6 IMPLANT
GOWN STRL REUS W/ TWL LRG LVL3 (GOWN DISPOSABLE) ×2 IMPLANT
GOWN STRL REUS W/TWL LRG LVL3 (GOWN DISPOSABLE) ×6
KIT BASIN OR (CUSTOM PROCEDURE TRAY) ×3 IMPLANT
KIT PLEURX DRAIN CATH 1000ML (MISCELLANEOUS) ×3 IMPLANT
KIT PLEURX DRAIN CATH 15.5FR (DRAIN) ×3 IMPLANT
KIT ROOM TURNOVER OR (KITS) ×3 IMPLANT
NDL HYPO 25GX1X1/2 BEV (NEEDLE) ×1 IMPLANT
NEEDLE HYPO 25GX1X1/2 BEV (NEEDLE) ×3 IMPLANT
NS IRRIG 1000ML POUR BTL (IV SOLUTION) ×3 IMPLANT
PACK GENERAL/GYN (CUSTOM PROCEDURE TRAY) ×3 IMPLANT
PAD ARMBOARD 7.5X6 YLW CONV (MISCELLANEOUS) ×6 IMPLANT
SET DRAINAGE LINE (MISCELLANEOUS) IMPLANT
SPONGE GAUZE 4X4 12PLY (GAUZE/BANDAGES/DRESSINGS) ×2 IMPLANT
SUT ETHILON 3 0 PS 1 (SUTURE) ×3 IMPLANT
SUT SILK 2 0 SH (SUTURE) ×3 IMPLANT
SYR CONTROL 10ML LL (SYRINGE) ×3 IMPLANT
TAPE CLOTH SURG 4X10 WHT LF (GAUZE/BANDAGES/DRESSINGS) ×2 IMPLANT
TOWEL OR 17X24 6PK STRL BLUE (TOWEL DISPOSABLE) ×3 IMPLANT
TOWEL OR 17X26 10 PK STRL BLUE (TOWEL DISPOSABLE) ×3 IMPLANT
WATER STERILE IRR 1000ML POUR (IV SOLUTION) ×3 IMPLANT

## 2014-02-24 NOTE — Anesthesia Postprocedure Evaluation (Signed)
  Anesthesia Post-op Note  Patient: [name]  Procedure(s) Performed: Procedure(s): INSERTION PLEURAL DRAINAGE CATHETER (Left)  Patient Location: PACU  Anesthesia Type:MAC  Level of Consciousness: awake, alert  and oriented  Airway and Oxygen Therapy: Patient Spontanous Breathing and Patient connected to nasal cannula oxygen  Post-op Pain: mild  Post-op Assessment: Post-op Vital signs reviewed, Patient's Cardiovascular Status Stable, Respiratory Function Stable, Patent Airway and Pain level controlled  Post-op Vital Signs: stable  Last Vitals:  Filed Vitals:   02/24/14 1327  BP:   Pulse:   Temp: 36.1 C  Resp: 15    Complications: No apparent anesthesia complications

## 2014-02-24 NOTE — Progress Notes (Addendum)
CM spoke with patient wife concerning discharge planning. Pt offered choice for [facility] [facility] for HH services upon discharge. Per pt choice [facility] to provide HH services.  [facility] rep, Mary Yonjof, RN  contacted concerning new referral. Pt to discharge home alone. Pt request HHRN for disease management upon discharge. DME PleurX catheter drainage supplies/kit provided.    

## 2014-02-24 NOTE — Care Management Note (Signed)
    Page 1 of 1   02/24/2014     3:07:57 PM CARE MANAGEMENT NOTE 02/24/2014  Patient:  [name], [name]   Account Number:  [id number]  Date Initiated:  02/24/2014  Documentation initiated by:  [facility]  Subjective/Objective Assessment:   PRE-OPERATIVE DIAGNOSIS:  LEFT PLEURAL EFFUSION//Home with spouse     Action/Plan:   PROCEDURE:  INSERTION PLEURAL DRAINAGE CATHETER (Left)//arrange [place] services.   Anticipated DC Date:  02/24/2014   Anticipated DC Plan:  [place]  CM consult      [facility] Choice  HOME HEALTH   Choice offered to / List presented to:  C-3 Spouse   DME arranged  CHEST TUBE - PLUEREX        HH arranged  HH-1 RN  [place] [facility]      [place] agency  [facility]   Status of service:  Completed, signed off Medicare Important Message given?   (If response is "NO", the following Medicare IM given date fields will be blank) Date Medicare IM given:   Date Additional Medicare IM given:    Discharge Disposition:  [place]  Per UR Regulation:  Reviewed for med. necessity/level of care/duration of stay  If discussed at Long Length of Stay Meetings, dates discussed:    Comments:  CM spoke with patient wife concerning discharge planning. Pt offered choice for [facility] for [facility] services upon discharge. Per pt choice [facility] Go to provide [facility] services. Ivar Bury, RN  contacted concerning new referral. Pt to discharge home alone. Pt request HHRN for disease management upon discharge. DME PleurX catheter drainage supplies/kit provided.

## 2014-02-24 NOTE — Anesthesia Preprocedure Evaluation (Signed)
Anesthesia Evaluation  Patient identified by MRN, date of birth, ID band Patient awake    Reviewed: Allergy & Precautions, H&P , NPO status , Patient's Chart, lab work & pertinent test results  Airway Mallampati: II TM Distance: >3 FB Neck ROM: Full    Dental  (+) Poor Dentition   Pulmonary former smoker,  breath sounds clear to auscultation        Cardiovascular hypertension, Rhythm:Regular Rate:Normal     Neuro/Psych    GI/Hepatic   Endo/Other  diabetes  Renal/GU      Musculoskeletal   Abdominal   Peds  Hematology   Anesthesia Other Findings   Reproductive/Obstetrics                           Anesthesia Physical Anesthesia Plan  ASA: III  Anesthesia Plan: MAC   Post-op Pain Management:    Induction: Intravenous  Airway Management Planned: Natural Airway and Simple Face Mask  Additional Equipment:   Intra-op Plan:   Post-operative Plan:   Informed Consent: I have reviewed the patients History and Physical, chart, labs and discussed the procedure including the risks, benefits and alternatives for the proposed anesthesia with the patient or authorized representative who has indicated his/her understanding and acceptance.   Dental advisory given  Plan Discussed with: CRNA and Anesthesiologist  Anesthesia Plan Comments:         Anesthesia Quick Evaluation

## 2014-02-24 NOTE — Brief Op Note (Signed)
02/24/2014  12:38 PM  PATIENT:  [name]  80 y.o. male  PRE-OPERATIVE DIAGNOSIS:  LEFT PLEURAL EFFUSION  POST-OPERATIVE DIAGNOSIS:  Left pleural effusion  PROCEDURE:  Procedure(s): INSERTION PLEURAL DRAINAGE CATHETER (Left)  SURGEON:  Surgeon(s) and Role:    * Ivin Poot, MD - Primary  PHYSICIAN ASSISTANT: 0  ASSISTANTS: none   ANESTHESIA:   IV sedation  EBL:   0  BLOOD ADMINISTERED:none  DRAINS  left pleurx cath  LOCAL MEDICATIONS USED:  LIDOCAINE  8 cc  SPECIMEN:  No Specimen  DISPOSITION OF SPECIMEN:  N/A  COUNTS:  YES  TOURNIQUET:  * No tourniquets in log *  DICTATION: .Dragon Dictation  PLAN OF CARE: Admit for overnight observation  PATIENT DISPOSITION:  PACU - hemodynamically stable.   Delay start of Pharmacological VTE agent (>24hrs) due to surgical blood loss or risk of bleeding: yes

## 2014-02-24 NOTE — Transfer of Care (Signed)
Immediate Anesthesia Transfer of Care Note  Patient: [name]  Procedure(s) Performed: Procedure(s): INSERTION PLEURAL DRAINAGE CATHETER (Left)  Patient Location: PACU  Anesthesia Type:MAC  Level of Consciousness: awake, alert , oriented and patient cooperative  Airway & Oxygen Therapy: Patient Spontanous Breathing and Patient connected to face mask oxygen  Post-op Assessment: Report given to PACU RN and Post -op Vital signs reviewed and stable  Post vital signs: Reviewed and stable  Complications: No apparent anesthesia complications

## 2014-02-24 NOTE — Progress Notes (Signed)
The patient was examined and preop studies reviewed. There has been no change from the prior exam and the patient is ready for surgery.  plan placement of Left Pleurx cath today for recurrent L effusion

## 2014-02-25 ENCOUNTER — Encounter (HOSPITAL_COMMUNITY): Payer: Self-pay | Admitting: Cardiothoracic Surgery

## 2014-02-25 ENCOUNTER — Observation Stay (HOSPITAL_COMMUNITY): Payer: Medicare Other

## 2014-02-25 LAB — GLUCOSE, CAPILLARY
Glucose-Capillary: 92 mg/dL (ref 70–99)
Glucose-Capillary: 103 mg/dL — ABNORMAL HIGH (ref 70–99)

## 2014-02-25 LAB — BASIC METABOLIC PANEL
BUN: 24 mg/dL — ABNORMAL HIGH (ref 6–23)
CO2: 26 mEq/L (ref 19–32)
Calcium: 8.8 mg/dL (ref 8.4–10.5)
Chloride: 106 mEq/L (ref 96–112)
Creatinine, Ser: 0.73 mg/dL (ref 0.50–1.35)
GFR calc Af Amer: >90 mL/min (ref >90)
GFR calc non Af Amer: 83 mL/min — ABNORMAL LOW (ref >90)
Glucose, Bld: 94 mg/dL (ref 70–99)
Potassium: 4.4 mEq/L (ref 3.7–5.3)
Sodium: 140 mEq/L (ref 137–147)

## 2014-02-25 LAB — CBC
HCT: 35.6 % — ABNORMAL LOW (ref 39.0–52.0)
Hemoglobin: 11.5 g/dL — ABNORMAL LOW (ref 13.0–17.0)
MCH: 31.7 pg (ref 26.0–34.0)
MCHC: 32.3 g/dL (ref 30.0–36.0)
MCV: 98.1 fL (ref 78.0–100.0)
Platelets: 166 10*3/uL (ref 150–400)
RBC: 3.63 MIL/uL — ABNORMAL LOW (ref 4.22–5.81)
RDW: 13.4 % (ref 11.5–15.5)
WBC: 5.6 10*3/uL (ref 4.0–10.5)

## 2014-02-25 MED ORDER — STARCH (THICKENING) PO POWD: ORAL | Status: DC

## 2014-02-25 MED ORDER — ENSURE PUDDING PO PUDG: 1.0000 | Freq: Three times a day (TID) | ORAL | Status: DC

## 2014-02-25 NOTE — Op Note (Signed)
[name], [name] NO.:  [id number]  MEDICAL RECORD NO.:  [id number]  LOCATION:  1P50D                        FACILITY:  [facility]  PHYSICIAN:  Ivin Poot, M.D.  DATE OF BIRTH:  1927-12-29  DATE OF PROCEDURE:  02/24/2014 DATE OF DISCHARGE:                              OPERATIVE REPORT   OPERATION:  Placement of the left Pleurx catheter.  PREOPERATIVE DIAGNOSIS:  Recurrent left pleural effusion, probable from congestive heart failure.  SURGEON:  Ivin Poot, M.D.  ANESTHESIA:  IV conscious sedation with monitoring, as well as topical 1% lidocaine.  OPERATIVE PROCEDURE:  The patient was brought to the operating room and placed supine on the operating table.  The left chest was prepped and draped as a sterile field.  A proper time-out was performed.  Local anesthesia was anesthetized into the anterior axillary line at the fifth interspace.  It was also injected into the midclavicular line on the left just above the costal margin.  A small incision was made in the upper incision and a needle was used to aspirate pleural fluid.  Through the needle, a guidewire was passed in the pleural space with the position confirmed with fluoroscopy.  An incision was made in the lower area above the costal margin.  It had been anesthetized.  The PleurX catheter was then tunneled from the lower incision to the upper incision.  Next in the upper incision, a dilator and then a tear-away sheath was passed over the guidewire into the pleural space.  Through the tear-away sheath, the PleurX catheter was advanced into the dependent portion of the left pleural space.  Fluid drained immediately.  The catheter was then connected to the vacuum bottle, and 1 L of serosanguineous fluid was removed.  A chest x-ray was taken in the operating room, which showed evacuation of the pleural effusion without pneumothorax.  The upper incision was closed with subcutaneous Vicryl and  then nylon interrupted sutures for the small skin incision.  Sterile dressings were applied.  The patient returned to the recovery room, breathing spontaneously in stable condition.     Ivin Poot, M.D.     PV/MEDQ  D:  02/24/2014  T:  02/25/2014  Job:  [id number]

## 2014-02-25 NOTE — Progress Notes (Signed)
Pt d/c home.  Alert and oriented x4, but forgetful.  Dressing to chest is dry/intact.  Pt and family given instructions on diet, activity, home health, dressing changes, meds, and follow-up care and instructions.  Pt and family verbalized understanding.  Pt's family were trying to leave prior to wheelchair delivery, but nursing asked them to stay until it was delivered.  Pt's daughter had further questions about home health services, but when nursing went to contact CM, family wheeled pt out without contacting staff.  CM was notified that pt and family may have questions regarding home health when they get home.

## 2014-02-25 NOTE — Progress Notes (Signed)
INITIAL NUTRITION ASSESSMENT  DOCUMENTATION CODES Per approved criteria  -Severe malnutrition in the context of chronic illness   INTERVENTION: Ensure Pudding po TID, each supplement provides 170 kcal and 4 grams of protein RD to follow for nutrition care plan  NUTRITION DIAGNOSIS: Predicted suboptimal intake related to limited appetite, dysphagia as evidenced by nutrition assessment hx  Goal: Pt to meet >/= 90% of their estimated nutrition needs   Monitor:  PO & supplemental intake, weight, labs, I/O's  Reason for Assessment: Malnutrition Screening Tool Report  80 y.o. male  Admitting Dx: recurrent left pleural effusion  ASSESSMENT: Patient with PMH of DM, HTN, SOB and peripheral vascular disease; admitted with recurrent left pleural effusion probably multifactorial with recent pneumonia and diastolic heart failure.  RD unable to interview pt at time of visit; seen per Clinical Nutrition during recent hospital stay in April 2015; pt with hx of poor PO intake and dysphagia; per wt readings, pt with progressive weight loss; has had a 5% weight loss in ~ 2 week period; identified with malnutrition during last nutrition assessment; RD to order supplements.  Patient s/p procedure 6/4: INSERTION PLEURAL DRAINAGE CATHETER (Left)  Nutrition Focused Physical Exam:   Subcutaneous Fat:  Orbital Region: severe wasting  Upper Arm Region: moderate wasting  Thoracic and Lumbar Region: moderate wasting   Muscle:  Temple Region: severe wasting  Clavicle Bone Region: severe wasting  Clavicle and Acromion Bone Region: moderate wasting  Scapular Bone Region: severe wasting  Dorsal Hand: moderate wasting  Patellar Region: severe wasting  Anterior Thigh Region: severe wasting  Posterior Calf Region: severe wasting   Edema: none  Patient continues to meet criteria for severe malnutrition in the context of chronic illness as evidenced by 5% weight loss in < 1 month, severe muscle loss  and severe subcutaneous fat loss.  Height: Ht Readings from Last 1 Encounters:  02/24/14 5\' 11"  (1.803 m)    Weight: Wt Readings from Last 1 Encounters:  02/25/14 136 lb 14.4 oz (62.097 kg)    Ideal Body Weight: 172 lb  % Ideal Body Weight: 79%  Wt Readings from Last 10 Encounters:  02/25/14 136 lb 14.4 oz (62.097 kg)  02/25/14 136 lb 14.4 oz (62.097 kg)  02/22/14 136 lb 14.4 oz (62.097 kg)  02/16/14 145 lb (65.772 kg)  02/07/14 142 lb (64.411 kg)  12/31/13 142 lb 3.2 oz (64.5 kg)  12/29/13 144 lb (65.318 kg)  12/17/13 145 lb (65.772 kg)  10/23/13 155 lb (70.308 kg)  07/06/13 173 lb 4.5 oz (78.6 kg)    Usual Body Weight: 145 lb  % Usual Body Weight: 93%  BMI:  Body mass index is 19.1 kg/(m^2).  Estimated Nutritional Needs: Kcal: 1650-1850 Protein: 80-90 gm Fluid: 1.6-1.8 L  Skin: Intact  Diet Order: Carb Control, nectar-thick liquids  EDUCATION NEEDS: -No education needs identified at this time   Intake/Output Summary (Last 24 hours) at 02/25/14 1146 Last data filed at 02/25/14 0750  Gross per 24 hour  Intake    520 ml  Output    570 ml  Net    -50 ml    Labs:   Recent Labs Lab 02/22/14 1257 02/24/14 1705 02/25/14 0412  NA 144  --  140  K 4.8  --  4.4  CL 104  --  106  CO2 27  --  26  BUN 34*  --  24*  CREATININE 0.80 0.71 0.73  CALCIUM 9.9  --  8.8  GLUCOSE 134*  --  94    CBG (last 3)   Recent Labs  02/24/14 1630 02/25/14 0624 02/25/14 1108  GLUCAP 78 92 103*    Scheduled Meds: . albuterol  2.5 mg Nebulization QID  . aspirin EC  81 mg Oral Daily  . carvedilol  6.25 mg Oral BID WC  . cilostazol  50 mg Oral BID  . doxycycline  100 mg Oral Q12H  . enoxaparin (LOVENOX) injection  30 mg Subcutaneous Q24H  . famotidine  20 mg Oral Daily  . geriatric multivitamins-minerals  15 mL Oral TID WC  . loratadine  10 mg Oral Daily  . magnesium oxide  200 mg Oral Daily  . metFORMIN  500 mg Oral BID WC  . simvastatin  40 mg Oral QPM  .  tiotropium  18 mcg Inhalation Daily  . vitamin B-12  1,000 mcg Oral Daily    Continuous Infusions: . 0.9 % NaCl with KCl 20 mEq / L 50 mL/hr at 02/24/14 1823  . lactated ringers 50 mL/hr at 02/24/14 0935    Past Medical History  Diagnosis Date  . Dysrhythmia     atrial fibrilation  . Hypertension   . Shortness of breath   . Claudication of calf muscles 11/05/2012    right calf  . GERD (gastroesophageal reflux disease)   . TIA (transient ischemic attack) 2010  . Peripheral vascular disease   . On home oxygen therapy     "2L; w/activity" (02/24/2014)  . Neuromuscular disorder     dizziness  . Macular degeneration, bilateral     "has had shots in both eyes"  . Coronary artery disease   . COPD (chronic obstructive pulmonary disease)   . Type II diabetes mellitus   . Stroke 05/2013    "can't use left arm fully since"  . Arthritis     "legs" (02/24/2014)  . Pneumonia     "5 times back to back recently (just finished antibiiotic) " (02/24/2014)    Past Surgical History  Procedure Laterality Date  . Foot surgery Left     due to broken foot years ago  . Esophagogastroduodenoscopy N/A 11/02/2013    Procedure: ESOPHAGOGASTRODUODENOSCOPY (EGD);  Surgeon: Cleotis Nipper, MD;  Location[facility] ENDOSCOPY;  Service: Endoscopy;  Laterality: N/A;  . Vascular surgery      stents to legs  . Cardiac catheterization    . Transurethral resection of prostate    . Femoral artery stent Left 10/2012  . Chest tube insertion Left 02/24/2014    Procedure: INSERTION PLEURAL DRAINAGE CATHETER;  Surgeon: Ivin Poot, MD;  Location: [place];  Service: Thoracic;  Laterality: Left;    Arthur Holms, RD, LDN Pager #: [phone] After-Hours Pager #: [phone]

## 2014-02-25 NOTE — Discharge Instructions (Signed)
What to eat:  For your first meals, you should eat lightly; only small meals initially.  If you do not have nausea, you may eat larger meals.  Avoid spicy, greasy and heavy food.    General Anesthesia, Adult, Care After  Refer to this sheet in the next few weeks. These instructions provide you with information on caring for yourself after your procedure. Your health care provider may also give you more specific instructions. Your treatment has been planned according to current medical practices, but problems sometimes occur. Call your health care provider if you have any problems or questions after your procedure.  WHAT TO EXPECT AFTER THE PROCEDURE  After the procedure, it is typical to experience:  Sleepiness.  Nausea and vomiting. HOME CARE INSTRUCTIONS  For the first 24 hours after general anesthesia:  Have a responsible person with you.  Do not drive a car. If you are alone, do not take public transportation.  Do not drink alcohol.  Do not take medicine that has not been prescribed by your health care provider.  Do not sign important papers or make important decisions.  You may resume a normal diet and activities as directed by your health care provider.  Change bandages (dressings) as directed.  If you have questions or problems that seem related to general anesthesia, call the hospital and ask for the anesthetist or anesthesiologist on call. SEEK MEDICAL CARE IF:  You have nausea and vomiting that continue the day after anesthesia.  You develop a rash. SEEK IMMEDIATE MEDICAL CARE IF:  You have difficulty breathing.  You have chest pain.  You have any allergic problems. Document Released: 12/16/2000 Document Revised: 05/12/2013 Document Reviewed: 03/25/2013  [facility] Patient Information 2014 [place], [place].   Pleural Effusion The lining covering your lungs and the inside of your chest is called the pleura. Usually, the space between the 2 pleura contains no air and only a thin  layer of fluid. A pleural effusion is an abnormal buildup of fluid in the pleural space. Fluid gathers when there is increased pressure in the lung vessels. This forces fluids out of the lungs and into the pleural space. Vessels may also leak fluids when there are infections, such as pneumonia, or other causes of soreness and redness (inflammation). Fluids leak into the lungs when protein in the blood is low or when certain vessels (lymphatics) are blocked. Finding a pleural effusion is important because it is usually caused by another disease. In order to treat a pleural effusion, your caregiver needs to find its cause. If left untreated, a large amount of fluid can build up and cause collapse of the lung. CAUSES   Heart failure.  Infections (pneumonia, tuberculosis), pulmonary embolism, pulmonary infarction.  Cancer (primary lung and metastatic), asbestosis.  Liver failure (cirrhosis).  Nephrotic syndrome, peritoneal dialysis, kidney problems (uremia).  Collagen vascular disease (systemic lupus erythematosis, rheumatoid arthritis).  Injury (trauma) to the chest or rupture of the digestive tube (esophagus).  Material in the chest or pleural space (hemothorax, chylothorax).  Pancreatitis.  Surgery.  Drug reactions. SYMPTOMS  A pleural effusion can decrease the amount of space available for breathing and make you short of breath. The fluid can become infected, which may cause pain and fever. Often, the pain is worse when taking a deep breath. The underlying disease (heart failure, pneumonia, blood clot, tuberculosis, cancer) may also cause symptoms. DIAGNOSIS   Your caregiver can usually tell what is wrong by talking to you (taking a history), doing an  exam, and taking a routine X-ray. If the X-ray shows fluid in your chest, often fluid is removed from your chest with a needle for testing (diagnostic thoracentesis).  Sometimes, more specialized X-rays may be needed.  Sometimes, a  small piece of tissue is removed and examined by a specialist (biopsy). TREATMENT  Treatment varies based on what caused the pleural effusion. Treatments include:  Removing as much fluid as possible using a needle (thoracentesis) to improve the cough and shortness of breath. This is a simple procedure which can be done at bedside. The risks are bleeding, infection, collapse of a lung, or low blood pressure.  Placing a tube in the chest to drain the effusion (tube thoracostomy). This is often used when there is an infection in the fluid. This is a simple procedure which can often be done at bedside or in a clinic. The procedure may be painful. The risks are the same as using a needle to drain the fluid. The chest tube usually remains for a few days and is connected to suction to improve fluid drainage. The tube, after placement, usually does not cause much discomfort.  Surgical removal of fibrous debris in and around the pleural space (decortication). This may be done with a flexible telescope (thoracoscope) through a small or large cut (incision). This is helpful for patients who have fibrosis or scar tissue that prevents complete lung expansion. The risks are infection, blood loss, and side effects from general anesthesia.  Sometimes, a procedure called pleurodesis is done. A chest tube is placed and the fluid is drained. Next, an agent (tetracycline, talc powder) is added to the pleural space. This causes the lung and chest wall to stick together (adhesion). This leaves no potential space for fluid to build up. The risks include infection, blood loss, and side effects from general anesthesia.  If the effusion is caused by infection, it may be treated with antibiotics and improve without draining. HOME CARE INSTRUCTIONS   Take any medicines exactly as prescribed.  Follow up with your caregiver as directed.  Monitor your exercise capacity (the amount of walking you can do before you get short of  breath).  Do not smoke. Ask your caregiver for help quitting. SEEK MEDICAL CARE IF:   Your exercise capacity seems to get worse or does not improve with time.  You do not recover from your illness. SEEK IMMEDIATE MEDICAL CARE IF:   Shortness of breath or chest pain develops or gets worse.  You have an oral temperature above 102 F (38.9 C), not controlled by medicine.  You develop a new cough, especially if the mucus (phlegm) is discolored. MAKE SURE YOU:   Understand these instructions.  Will watch your condition.  Will get help right away if you are not doing well or get worse. Document Released: 09/09/2005 Document Revised: 06/30/2013 Document Reviewed: 05/01/2007 [facility] Patient Information 2014 [place].    Vascular PRN  Compression Booties/Sequential Compression Device # [id number] or [id number]  Reclining Lift Chair Please follow up with Primary Care Physician.

## 2014-02-25 NOTE — Evaluation (Signed)
Occupational Therapy Evaluation Patient Details Name: [name] MRN: [id number] DOB: 04-12-1928 Today's Date: 02/25/2014    History of Present Illness 80 yo male s/p INSERTION PLEURAL DRAINAGE CATHETER Lt side    Clinical Impression   Patient is s/p insertion of Lt side pleural drainage cathether surgery resulting in functional limitations due to the deficits listed below (see OT problem list).  Patient will benefit from skilled OT acutely to increase independence and safety with ADLS to allow discharge SNF. OT to defer all further needs to SNF level of care.     Follow Up Recommendations  SNF;Supervision/Assistance - 24 hour    Equipment Recommendations  Wheelchair (measurements OT);Wheelchair cushion (measurements OT);Other (comment);Hospital bed;3 in 1 bedside comode (defer SNF)    Recommendations for Other Services       Precautions / Restrictions Precautions Precautions: Fall      Mobility Bed Mobility Overal bed mobility: Needs Assistance Bed Mobility: Supine to Sit     Supine to sit: Min assist     General bed mobility comments: Pt progressed to EOB with RT UE pulling on rail. pt needed (A) to rotate hips and place bil LE on floor. Pt was able to complete 90% of transfers   Transfers Overall transfer level: Needs assistance Equipment used: 1 person hand held assist Transfers: Sit to/from Stand Sit to Stand: Max assist         General transfer comment: Pt requires LT LE blocked and (A) to achieve full uprigh tposture. Pt static standing min (A)    Balance Overall balance assessment: Needs assistance Sitting-balance support: No upper extremity supported;Feet supported Sitting balance-Leahy Scale: Fair     Standing balance support: Single extremity supported;During functional activity Standing balance-Leahy Scale: Poor                              ADL Overall ADL's : Needs assistance/impaired Eating/Feeding: Set up;Bed level    Grooming: Wash/dry face;Set up;Sitting   Upper Body Bathing: Maximal assistance;Sitting   Lower Body Bathing: Maximal assistance;Sitting/lateral leans   Upper Body Dressing : Maximal assistance;Sitting       Toilet Transfer: Maximal assistance;Stand-pivot;BSC           Functional mobility during ADLs: Maximal assistance General ADL Comments: Pt supine <>Sit EOB with extended time and (A). pt noted to have incontinence of bladder. pT reports "i am not sure why I am sweating so much" Pt reports ability to walk with quad cane. pt provided IV pole to attempt several steps to chair. Pt scissoring gait pattern , decr gait length and velocity. Pt with Lt LEan. Pt requires max (A) to transfer ~4 ft      Vision                     Perception     Praxis      Pertinent Vitals/Pain 2L oxygen Tolerated RA for transfer 90% Returned to nasal cannula at end of session     Hand Dominance Right   Extremity/Trunk Assessment Upper Extremity Assessment Upper Extremity Assessment: LUE deficits/detail LUE Deficits / Details: AROM shoulder flexion 2- out 5 brunstrom II  ~ 10 degrees, elbow flexion 3--  out 5, abduction shoulder ~30 degrees 3- out 5 grasp brunstrom III.  LUE Sensation: decreased light touch LUE Coordination: decreased fine motor;decreased gross motor   Lower Extremity Assessment Lower Extremity Assessment: Defer to PT evaluation;LLE deficits/detail LLE Deficits / Details:  reports decr sensation   Cervical / Trunk Assessment Cervical / Trunk Assessment: Normal   Communication Communication Communication: No difficulties   Cognition Arousal/Alertness: Awake/alert Behavior During Therapy: WFL for tasks assessed/performed Overall Cognitive Status: Impaired/Different from baseline Area of Impairment: Orientation;Attention;Memory;Following commands;Safety/judgement;Awareness;Problem solving Orientation Level: Disoriented to;Time Current Attention Level:  Sustained Memory: Decreased short-term memory Following Commands: Follows multi-step commands inconsistently Safety/Judgement: Decreased awareness of deficits Awareness: Emergent Problem Solving: Slow processing;Decreased initiation;Difficulty sequencing General Comments: Pt reports using a RW. OT obtains RW. Pt then reports "i dont think that will work" and demonstrates non- use of LT UE. Pt then describes a quad cane. Pt transfering with OT to chair and reports "I sit in that chair and use my feet normally" pt describing a w/c.    General Comments       Exercises       Shoulder Instructions      Home Living Family/patient expects to be discharged to:: Private residence Living Arrangements: Spouse/significant other- pt states "wife well yeah wife we have been together over 30 years" . Per chart its noted pt is not married and pt's statement was uncertain of marriage. Question accuracy?   Type of Home: Mobile home Home Access: Stairs to enter Entrance Stairs-Number of Steps: 1   Home Layout: One level     Bathroom Shower/Tub: Teacher, early years/pre: Standard     Home Equipment: Tub bench;Cane - quad;Wheelchair - manual   Additional Comments: pt poor historian- information confirmed by informatoin in chart from previous admission. Chart also states pt was from SNF but pt reports from home. Pt inconsistent in reporting DME used      Prior Functioning/Environment Level of Independence: Independent with assistive device(s)        Comments: per chart pt from SNF- baseline on evaluation question independence    OT Diagnosis:     OT Problem List:     OT Treatment/Interventions:      OT Goals(Current goals can be found in the care plan section) Acute Rehab OT Goals Patient Stated Goal: to get to see his son  OT Frequency:     Barriers to D/C:            Co-evaluation              End of Session Nurse Communication: Mobility  status;Precautions  Activity Tolerance: Patient tolerated treatment well Patient left: in chair;with call bell/phone within reach   Time: 5916-3846 OT Time Calculation (min): 31 min Charges:  OT General Charges $OT Visit: 1 Procedure OT Evaluation $Initial OT Evaluation Tier I: 1 Procedure OT Treatments $Self Care/Home Management : 23-37 mins G-Codes: OT G-codes **NOT FOR INPATIENT CLASS** Functional Assessment Tool Used: clinical judgement Functional Limitation: Self care Self Care Current Status (K5993): At least 60 percent but less than 80 percent impaired, limited or restricted Self Care Goal Status (T7017): At least 60 percent but less than 80 percent impaired, limited or restricted Self Care Discharge Status [phone]): At least 60 percent but less than 80 percent impaired, limited or restricted  Peri Maris 02/25/2014, 10:50 AM Pager: [phone]

## 2014-02-25 NOTE — Progress Notes (Addendum)
CARE MANAGEMENT NOTE 02/25/2014  Patient:  [name], [name]   Account Number:  [id number]  Date Initiated:  02/24/2014  Documentation initiated by:  [facility]  Subjective/Objective Assessment:   PRE-OPERATIVE DIAGNOSIS:  LEFT PLEURAL EFFUSION//Home with spouse     Action/Plan:   PROCEDURE:  INSERTION PLEURAL DRAINAGE CATHETER (Left)//arrange [place] services.   Anticipated DC Date:  02/24/2014   Anticipated DC Plan:  [place]  CM consult      [facility] Choice  HOME HEALTH   Choice offered to / List presented to:  C-3 Spouse   DME arranged  [facility] - [place]      DME agency  [place] arranged  HH-1 RN  [place].   Status of service:  Completed, signed off Medicare Important Message given?  NA - LOS <3 / Initial given by admissions (If response is "NO", the following Medicare IM given date fields will be blank) Date Medicare IM given:   Date Additional Medicare IM given:    Discharge Disposition:  [facility]  Per UR Regulation:  Reviewed for med. necessity/level of care/duration of stay  If discussed at Long Length of Stay Meetings, dates discussed:    Comments:  NCM spoke to pt and gave permission to speak to wife. Wife is requesting [facility] for [facility]. States pt had [facility] in the past. Notified [facility] to cancel [facility]. Wife requesting light weight wheelchair. Contact [facility] rep for DME for home. Provided pt's dtr with info on SCD's for home. Explained PCP will complete paperwork for Reclining Lift Chair for home. Notified [facility] of new referral. Jonnie Finner RN CCM Case Mgmt phone [phone]  Fuller Mandril, RN Registered Nurse Addendum CASE MANAGEMENT Progress Notes Service date: 02/24/2014 2:23 PM CM spoke with patient wife concerning discharge planning. Pt offered choice for [facility] for [facility] services upon discharge. Per pt choice  [facility] to provide [facility] services. Ivar Bury, RN  contacted concerning new referral. Pt to discharge home alone. Pt request [facility] for disease management upon discharge. DME PleurX catheter drainage supplies/kit provided.    Revision History.Marland KitchenMarland Kitchen

## 2014-02-25 NOTE — Progress Notes (Signed)
      [address]       [place] [address]             [phone]      1 Day Post-Op Procedure(s) (LRB): INSERTION PLEURAL DRAINAGE CATHETER (Left) Subjective: Feels ok  Objective: Vital signs in last 24 hours: Temp:  [97 F (36.1 C)-97.5 F (36.4 C)] 97.5 F (36.4 C) (06/05 0413) Pulse Rate:  [58-87] 87 (06/05 0413) Cardiac Rhythm:  [-] Atrial fibrillation (06/04 1931) Resp:  [14-18] 16 (06/05 0413) BP: (96-160)/(53-104) 118/75 mmHg (06/05 0413) SpO2:  [96 %-100 %] 98 % (06/05 0413) Weight:  [136 lb 14 oz (62.086 kg)-143 lb 4.8 oz (65 kg)] 136 lb 14.4 oz (62.097 kg) (06/05 0413)  Hemodynamic parameters for last 24 hours:    Intake/Output from previous day: 06/04 0701 - 06/05 0700 In: 400 [I.V.:400] Out: 570 [Urine:550; Blood:20] Intake/Output this shift:    General appearance: alert, cooperative and no distress Heart: irregularly irregular rhythm Lungs: mildly dim in the left base Abdomen: benign Extremities: no edema Wound: dressing CDI  Lab Results:  Recent Labs  02/24/14 1705 02/25/14 0412  WBC 5.3 5.6  HGB 12.6* 11.5*  HCT 39.0 35.6*  PLT 206 166   BMET:  Recent Labs  02/22/14 1257 02/24/14 1705 02/25/14 0412  NA 144  --  140  K 4.8  --  4.4  CL 104  --  106  CO2 27  --  26  GLUCOSE 134*  --  94  BUN 34*  --  24*  CREATININE 0.80 0.71 0.73  CALCIUM 9.9  --  8.8    PT/INR:  Recent Labs  02/22/14 1257  LABPROT 14.0  INR 1.10   ABG    Component Value Date/Time   TCO2 25 12/29/2013 0953   CBG (last 3)   Recent Labs  02/24/14 1252 02/24/14 1630 02/25/14 0624  GLUCAP 84 78 92    Assessment/Plan: S/P Procedure(s) (LRB): INSERTION PLEURAL DRAINAGE CATHETER (Left) Doing well- stable Plan for discharge: see discharge orders   LOS: 1 day    [name] 02/25/2014

## 2014-03-11 ENCOUNTER — Other Ambulatory Visit: Payer: Self-pay | Admitting: Cardiothoracic Surgery

## 2014-03-11 DIAGNOSIS — J90 Pleural effusion, not elsewhere classified: Secondary | ICD-10-CM

## 2014-03-16 ENCOUNTER — Ambulatory Visit (INDEPENDENT_AMBULATORY_CARE_PROVIDER_SITE_OTHER): Payer: Medicare Other | Admitting: Cardiothoracic Surgery

## 2014-03-16 ENCOUNTER — Ambulatory Visit
Admission: RE | Admit: 2014-03-16 | Discharge: 2014-03-16 | Disposition: A | Discharge status: Home / Self Care | Payer: Medicare Other | Source: Ambulatory Visit | Attending: Cardiothoracic Surgery | Admitting: Cardiothoracic Surgery

## 2014-03-16 ENCOUNTER — Encounter: Payer: Self-pay | Admitting: Cardiothoracic Surgery

## 2014-03-16 VITALS — BP 119/78 | HR 82 | Resp 20 | Ht 71.0 in | Wt 136.0 lb

## 2014-03-16 DIAGNOSIS — Z09 Encounter for follow-up examination after completed treatment for conditions other than malignant neoplasm: Secondary | ICD-10-CM

## 2014-03-16 DIAGNOSIS — J90 Pleural effusion, not elsewhere classified: Secondary | ICD-10-CM

## 2014-03-16 NOTE — Progress Notes (Signed)
PCP is Curtis Gravel, MD Referring Provider is Brand Males, MD  Chief Complaint  Patient presents with  . Routine Post Op    F/U from surgery with CXR S/P Left PleurX cath placement 02/24/14,     HPI: One month return for followup of left Pleurx catheter placed for nonmalignant recurrent effusion probably related to diastolic heart failure. The patient's breathing and dry cough have significantly improved. Home health has been draining the catheter Monday Wednesday Friday but recently the drainage has decreased from 400 cc to 150 cc and drainage schedule will be reduced to twice a week. The incisions are healed and the sutures are removed in the office today.  Chest x-ray today shows the catheter in good position with minimal left pleural effusion. Today 150 cc was drained from the left pleural space of clear yellow fluid  Past Medical History  Diagnosis Date  . Dysrhythmia     atrial fibrilation  . Hypertension   . Shortness of breath   . Claudication of calf muscles 11/05/2012    right calf  . GERD (gastroesophageal reflux disease)   . TIA (transient ischemic attack) 2010  . Peripheral vascular disease   . On home oxygen therapy     "2L; w/activity" (02/24/2014)  . Neuromuscular disorder     dizziness  . Macular degeneration, bilateral     "has had shots in both eyes"  . Coronary artery disease   . COPD (chronic obstructive pulmonary disease)   . Type II diabetes mellitus   . Stroke 05/2013    "can't use left arm fully since"  . Arthritis     "legs" (02/24/2014)  . Pneumonia     "5 times back to back recently (just finished antibiiotic) " (02/24/2014)    Past Surgical History  Procedure Laterality Date  . Foot surgery Left     due to broken foot years ago  . Esophagogastroduodenoscopy N/A 11/02/2013    Procedure: ESOPHAGOGASTRODUODENOSCOPY (EGD);  Surgeon: Cleotis Nipper, MD;  Location: [facility] ENDOSCOPY;  Service: Endoscopy;  Laterality: N/A;  . Vascular surgery      stents  to legs  . Cardiac catheterization    . Transurethral resection of prostate    . Femoral artery stent Left 10/2012  . Chest tube insertion Left 02/24/2014    Procedure: INSERTION PLEURAL DRAINAGE CATHETER;  Surgeon: Ivin Poot, MD;  Location: [facility] OR;  Service: Thoracic;  Laterality: Left;    Family History  Problem Relation Age of Onset  . Cancer Neg Hx   . CAD Neg Hx     Social History History  Substance Use Topics  . Smoking status: Former Smoker -- 3.00 packs/day for 35 years    Types: Cigarettes    Quit date: 09/23/1981  . Smokeless tobacco: Never Used  . Alcohol Use: Yes     Comment: QUITS YEARS AGO    Current Outpatient Prescriptions  Medication Sig Dispense Refill  . albuterol (PROVENTIL) (2.5 MG/3ML) 0.083% nebulizer solution Take 3 mLs (2.5 mg total) by nebulization 4 (four) times daily.  75 mL  12  . budesonide (PULMICORT) 0.5 MG/2ML nebulizer solution Take 0.5 mg by nebulization 2 (two) times daily as needed (for wheezing/shortness of breath).       . carvedilol (COREG) 6.25 MG tablet Take 6.25 mg by mouth 2 (two) times daily with a meal.      . cilostazol (PLETAL) 50 MG tablet Take 50 mg by mouth 2 (two) times daily.      Marland Kitchen  food thickener (THICK IT) POWD Liquid to be nectar thick  850 g  1  . loratadine (CLARITIN) 10 MG tablet Take 10 mg by mouth daily.      . magnesium oxide (MAG-OX) 400 MG tablet Take 200 mg by mouth daily.      . Melatonin 3 MG TABS Take 3 mg by mouth at bedtime.      . metFORMIN (GLUCOPHAGE) 1000 MG tablet Take 500 mg by mouth 2 (two) times daily with a meal.      . Multiple Vitamins-Minerals (PRESERVISION AREDS PO) Take 2 capsules by mouth daily.      . ranitidine (ZANTAC) 300 MG tablet Take 300 mg by mouth at bedtime.      . simvastatin (ZOCOR) 40 MG tablet Take 40 mg by mouth every evening.      . tiotropium (SPIRIVA) 18 MCG inhalation capsule Place 18 mcg into inhaler and inhale daily.      . traMADol (ULTRAM) 50 MG tablet Take 1 tablet  (50 mg total) by mouth every 6 (six) hours as needed for moderate pain.  30 tablet  1  . vitamin B-12 (CYANOCOBALAMIN) 1000 MCG tablet Take 1,000 mcg by mouth daily.       No current facility-administered medications for this visit.    No Known Allergies  Review of Systems his walking is improving is able to walk 200 feet with a walker, the patient is taking protein shakes to replace the protein lost through the Pleurx catheter drainage  BP 119/78  Pulse 82  Resp 20  Ht 5\' 11"  (1.803 m)  Wt 136 lb (61.689 kg)  BMI 18.98 kg/m2  SpO2 92% Physical Exam Alert responsive sitting in a wheelchair Breath sounds clear bilaterally Pleurx catheter incisions clean and dry, sutures removed No significant pedal edema  Diagnostic Tests: Chest x-ray clear, changes of COPD but no significant effusion  Pleurx in good position  Impression: Pleurx catheter functioning well Reduced it twice a week schedule for drainage  Plan: Return with chest x-ray in 4 weeks

## 2014-03-29 NOTE — Discharge Summary (Signed)
Physician Discharge Summary  Patient ID: [name] MRN: [id number] DOB/AGE: 04/09/1928 80 y.o.  Admit date: 02/24/2014 Discharge date: 03/29/2014  Admission Diagnoses:Pleural effusion  HPI: 80 year old Caucasian male diabetic ex-smoker presents for evaluation of a recurrent left pleural effusion which has required thoracentesis twice over the past 2 months. The patient had a significant decline in overall health since the winter when he developed a stroke from atrial fibrillation. He developed left-sided weakness and was started on Xarelto. A 2-D echocardiogram showed good LV function, good RV function, and moderate TR with moderate pulmonary hypertension. He was discharged to home but fell in March and sustained some left rib fractures. He then developed pneumonia on the left followed by pleural effusion, recurrent which required thoracentesis on 2 separate occasions. Each pleural cath removed over liter of fluid-cytology negative. The patient has been in a skilled nursing facility the past month and now is transitioning home. He is still a wheelchair on home oxygen. He was seen by pulmonary as an outpatient and recommended possible Pleurx catheter versus VATS for his recurrent effusion. They did not feel he is strong enough for bronchoscopy. The patient's last chest x-ray shows a moderate left pleural effusion. The patient's wife states he gets short of breath with minimal exertion and is having difficulty walking. He denies orthopnea or ankle edema.      Discharge Diagnoses:  Active Problems:   Pleural effusion, left   Pleural effusion on left   Discharged Condition: fair  Hospital Course: The patient was admitted and taken to the operating room on 02/24/2014 at which time he underwent the following procedure: DATE OF PROCEDURE: 02/24/2014  DATE OF DISCHARGE:  OPERATIVE REPORT  OPERATION: Placement of the left Pleurx catheter.  PREOPERATIVE DIAGNOSIS: Recurrent left pleural  effusion, probable from  congestive heart failure.  SURGEON: Ivin Poot, M.D.  ANESTHESIA: IV conscious sedation with monitoring, as well as topical  1% lidocaine.  The patient tolerated procedure well was taken to the recovery room in stable condition.  Consults: None  Discharge Exam: Blood pressure 118/75, pulse 87, temperature 97.5 F (36.4 C), temperature source Oral, resp. rate 16, height 5\' 11"  (1.803 m), weight 136 lb 14.4 oz (62.097 kg), SpO2 98.00%. General appearance: alert, cooperative and no distress  Heart: irregularly irregular rhythm  Lungs: mildly dim in the left base  Abdomen: benign  Extremities: no edema  Wound: dressing CDI  Hospital course: The patient was placed on observation overnight. The following morning he was examined and felt to be stable. At that time he was discharged home with home health arrangements made by the care manager team.    Disposition: 01-Home or Self Care     Medication List    STOP taking these medications       doxycycline 100 MG EC tablet  Commonly known as:  DORYX      TAKE these medications       albuterol (2.5 MG/3ML) 0.083% nebulizer solution  Commonly known as:  PROVENTIL  Take 3 mLs (2.5 mg total) by nebulization 4 (four) times daily.     budesonide 0.5 MG/2ML nebulizer solution  Commonly known as:  PULMICORT  Take 0.5 mg by nebulization 2 (two) times daily as needed (for wheezing/shortness of breath).     carvedilol 6.25 MG tablet  Commonly known as:  COREG  Take 6.25 mg by mouth 2 (two) times daily with a meal.     cilostazol 50 MG tablet  Commonly known as:  PLETAL  Take 50 mg by mouth 2 (two) times daily.     food thickener Powd  Commonly known as:  THICK IT  Liquid to be nectar thick     loratadine 10 MG tablet  Commonly known as:  CLARITIN  Take 10 mg by mouth daily.     magnesium oxide 400 MG tablet  Commonly known as:  MAG-OX  Take 200 mg by mouth daily.     Melatonin 3 MG Tabs  Take  3 mg by mouth at bedtime.     metFORMIN 1000 MG tablet  Commonly known as:  GLUCOPHAGE  Take 500 mg by mouth 2 (two) times daily with a meal.     PRESERVISION AREDS PO  Take 2 capsules by mouth daily.     ranitidine 300 MG tablet  Commonly known as:  ZANTAC  Take 300 mg by mouth at bedtime.     simvastatin 40 MG tablet  Commonly known as:  ZOCOR  Take 40 mg by mouth every evening.     tiotropium 18 MCG inhalation capsule  Commonly known as:  SPIRIVA  Place 18 mcg into inhaler and inhale daily.     traMADol 50 MG tablet  Commonly known as:  ULTRAM  Take 1 tablet (50 mg total) by mouth every 6 (six) hours as needed for moderate pain.     vitamin B-12 1000 MCG tablet  Commonly known as:  CYANOCOBALAMIN  Take 1,000 mcg by mouth daily.           Follow-up Information   Follow up with VAN Wilber Oliphant, MD. (office will contact you about an appointment and follow-up chest xray)    Specialty:  Cardiothoracic Surgery   Contact information:   [place] [address] [phone]       Follow up with [place], Occupational Therapy, and RN)    Contact information:   [address] [place] [address] [phone]       Signed: John Giovanni 03/29/2014, 1:27 PM

## 2014-04-02 NOTE — Discharge Summary (Signed)
patient examined and medical record reviewed,agree with above note. VAN TRIGT III,[name] 04/02/2014   

## 2014-04-04 ENCOUNTER — Ambulatory Visit (INDEPENDENT_AMBULATORY_CARE_PROVIDER_SITE_OTHER): Payer: Medicare Other | Admitting: Adult Health

## 2014-04-04 ENCOUNTER — Other Ambulatory Visit: Payer: Self-pay | Admitting: Cardiothoracic Surgery

## 2014-04-04 ENCOUNTER — Encounter: Payer: Self-pay | Admitting: Adult Health

## 2014-04-04 VITALS — BP 94/56 | HR 77 | Temp 97.5°F | Ht 67.0 in | Wt 134.6 lb

## 2014-04-04 DIAGNOSIS — J90 Pleural effusion, not elsewhere classified: Secondary | ICD-10-CM

## 2014-04-04 DIAGNOSIS — J438 Other emphysema: Secondary | ICD-10-CM

## 2014-04-04 DIAGNOSIS — J439 Emphysema, unspecified: Secondary | ICD-10-CM

## 2014-04-04 NOTE — Progress Notes (Signed)
Subjective:    Patient ID: [name], male    DOB: [birth year], 80 y.o.   MRN: [id number]  HPI  IOV 12/17/2013  Chief Complaint  Patient presents with  . Pulmonary Consult    Referred by [name] for PNA , COPD and trouble swollowing    80 year old male accompanied by his wife. He has multiple medical problems including macular degeneration, para arterial disease, hypertension, diabetes, atrial fibrillation. In October 2014 he developed a stroke despite being therapeutic on Coumadin. He was subsequent he switched to xarelto. Note, around the time of the stroke he probably fell down and sustained some rib fractures Since his stroke he has recovered and is able to do activities of daily living at a basic level except for one-sided weakness with residual left-sided mild hemiplegia. However, in February 2015 he was admitted for 5 days for new onset left lower lobe pneumonia [confirmed on CT scan of the chest February 2015]. It was diagnosed as a community-acquired pneumonia and treated as such. During this time he did have some anemia for which he was Hemoccult positive. He underwent an endoscopy by [facility] gastroenterology services and this was normal. It appears that at the time of followup with [name] his primary care physician this left lower lobe density has persisted along with pleural effusion and therefore he is been referred here  He and his wife are completely unsure why they're here today but they do admit to dysphagia and some mild chronic cough with white sputum. The dysphagia is new since the stroke. They're not sure if she did have a swallow evaluation but they think there might of had one while an inpatient and this was normal. That cough and white sputum are new since his discharge from pneumonia in early February 2015   Body mass index is 20.81 kg/(m^2).   reports that he quit smoking about 32 years ago. His smoking use included Cigarettes. He has a 105 pack-year smoking  history. He has never used smokeless tobacco.   Echocardiogram October 2014 shows cor pulmonale  REC #Dysphagia  - refer back to [facility] GI for dysphagia evaluation; concern this might cause of LLL pneumonia  #Left pleural effusion  - IR guided thoracentesis  By ultrasound - The fluid removal will be done by Interventional Radiology  - stop xarelto 48 hours prior to procedure and resume 24h after procedure   - they will remove not more than 1.5L  - fluid labs to be sent: cell count, gram stain and culture, cytology for malignant cells, chemistries for LDH, albumin,  Protein, glucose, triglyceride and lipase  - blood labs to be sent on same day / time: cbc, ldh, protein, albumin glucose, and lipase  #left sided pneumonia (updated plan 12/21/2013)  - will get CT chest after thora (currently scheduled pre-thora)  #Followup  - after thoracentesis with myself or NP Kaufman - APril 2015  [name] is a 80 y.o. male dysphagia / TIA and have had recurrent episodes of PNA treated as out patient by his PCP this was followed by noted pleural effusion that was tapped on 4/3/1 showing reactive mesothelial cells 1.2 liters after CT demonstrated this effusion and LLL / atx/ PNA and concern aspiration in left lower lobe. Admitted now 4/8 leg pain then SOB. Pcxr taken reveals about same effusion left base on this admission. Consulted now for concern need bronch and effusion noted. No distress, no hemoptysis.    4/3 Ct  chest - The left pleural effusion has not significantly changed in volume (THIS WAS DONE POST THORA) compared with the CT performed approximately 2 months ago.Progressive left lower lobe collapse without clear etiology. There is high-density particular matter within the collapsed left ower lobe, suggesting chronic aspiration. Depending on the results of thoracentesis, bronchoscopy may be warranted to exclude a centrally obstructing lesion. Increased patchy airspace  disease at the right lung base,possibly atelectasis or aspiration. Several subacute left-sided rib fractures again noted, without significant change.Extensive atherosclerosis.   4/3 - US guided thora 1.2 liters, 632 WBC 56 %lymph, prot ratio estimate 0.64 = exudative, no ldh   4/14: Pl LDH 70/Serum LDH 152: 46%, /Serum Alb 2.2gm% - Pl Albumin 1.7 = 0.5gm% . -> LDH suggests transudate. Albumin gradient suggests exudate. Ctyology non diagnostic mesothelial cells 1.3L removed. Wife gives hx of left sided rib fracture several months ago and problems of effusion post date this   Dysphagia  recurrent Likely exudative effusion likely secondary to left lower lobe atx from chronic aspiration  Aspiration PNA with noncompliance diet  COPD  - still very deconditioned.. Failed swallow on D3 diet per [facility]  - effusion is transudate v exudate; unclear because of mixed signals on LDH and protien/albumin studies especially he is not on lasix  - Definitely Related to LLL collapse and/or recent rib fracture  - Etiology for LLL collapse - ? Chronic aspiration v endobronchial lesion    PLAN  - strict asp diet  -chest pt to move secretions, flutter, IS  - needs bronch but unlikely will handle moderate sedation; will address in office at followup - appt given to see me at [facility] 02/07/14 9.45am monday  - Current fluid and abx per [facility]          OV 02/07/2014  Chief Complaint  Patient presents with  . Hospitalization Follow-up    C/o SOB with exertion and exercize.  Prod cough with white mucous.     Followup  For left sided pleural effusion associated with chronic left lower lobe atelectasis. Presumed etiology is aspiration versus trauma to left chest. FLuid is indeterminate for exudate v transudate.  Too frail to have a bronchoscopy.  Since his last visit he got hospitalized. His effusion was analyzed 2 times my knowledge but it was nondiagnostic. Likely transudate. Since then he has been  living in a SNIF facility. He is an extremely poor historian. The person who has accompanied him has been knowledge about him. But according to the companion who works in the [employer] facility she thinks he has been losing weight unspecified. Patient himself says he is doing okay he he did see [facility]ology on 02/01/2014. I reviewed those notes. He has no recollection of meeting them. According to their notes his dysphagia seems to have improved and they are not planning a PEG tube and underwent a follow him expectantly. Currently is on oxygen, sitting in a wheelchair. It is unknown what his physical and social status in the nursing home is   Dg Chest 1 View  02/07/2014   CLINICAL DATA:  Left pleural effusion followup.  EXAM: CHEST - 1 VIEW  COMPARISON:  01/03/2014  FINDINGS: Examination demonstrates worsening of a moderate size left pleural effusion likely with associated atelectasis. There is a stable to slightly worse small right pleural effusion. Mild stable cardiomegaly. Calcified plaque over the aortic arch. Remainder the exam is unchanged.  IMPRESSION: Worsening moderate size left pleural effusion likely with associated basilar atelectasis. Stable  to slightly worse small right pleural fluid collection.  Stable cardiomegaly.   Electronically Signed   By: Marin Olp M.D.   On: 02/07/2014 10:52    04/04/2014 Follow up  Returns for follow up of COPD and Pleural Effusion .  One month return for followup of left Pleurx catheter placed for nonmalignant recurrent effusion probably related to diastolic heart failure. Drains ~ 50cc fluid twice weekly  CXR 6/24 showed decreased left pleural effusion w/ improved aeration on left  Doing well on Spiriva with no flare in cough or wheezing.  No hemoptysis , chest pain, edema or orthopnea.  Appetite is fair .  Feels breathing is better since pleurx placed.  Wears O2 at night .    Marland Kitchen Review of Systems  Constitutional: Negative for fever and unexpected  weight change.  HENT:  Negative for dental problem, ear pain, nosebleeds, rhinorrhea, sinus pressure, sneezing, sore throat and trouble swallowing.   Eyes: Negative for redness and itching.  Respiratory: Positive for cough and shortness of breath. Negative for chest tightness and wheezing.   Cardiovascular: Negative for palpitations and leg swelling.  Gastrointestinal: Negative for nausea and vomiting.  Genitourinary: Negative for dysuria.  Musculoskeletal: Negative for joint swelling.  Skin: Negative for rash.  Neurological: Negative for headaches.  Hematological: Does not bruise/bleed easily.  Psychiatric/Behavioral: Negative for dysphoric mood. The patient is not nervous/anxious.        Objective:   Physical Exam  Nursing note and vitals reviewed. Constitutional: He is oriented to person, place, and time. He appears well-developed and well-nourished. No distress.  Frail male Sitting in wheel chair  HENT:  Head: Normocephalic and atraumatic.  Right Ear: External ear normal.  Left Ear: External ear normal.  Mouth/Throat: Oropharynx is clear and moist. No oropharyngeal exudate.  o2 on  Eyes: Conjunctivae and EOM are normal. Pupils are equal, round, and reactive to light. Right eye exhibits no discharge. Left eye exhibits no discharge. No scleral icterus.  Neck: Normal range of motion. Neck supple. No JVD present. No tracheal deviation present. No thyromegaly present.  Cardiovascular: Normal rate, regular rhythm and intact distal pulses.  Exam reveals no gallop and no friction rub.   No murmur heard. Pulmonary/Chest: Effort normal and breath sounds normal. No respiratory distress. He has no wheezes. He has no rales. He exhibits no tenderness.  Along left chest wall with dressing in place for pleurx-clean and dry ,  Abdominal: Soft. Bowel sounds are normal. He exhibits no distension and no mass. There is no tenderness. There is no rebound and no guarding.  Musculoskeletal: Normal  range of motion. He exhibits no edema and no tenderness.  Sitting in wheel chair  Lymphadenopathy:    He has no cervical adenopathy.  Neurological: He is alert and oriented to person, place, and time. He has normal reflexes. No cranial nerve deficit. Coordination normal.  Skin: Skin is warm and dry. No rash noted. He is not diaphoretic. No erythema. No pallor.  Psychiatric:         Assessment & Plan:

## 2014-04-04 NOTE — Patient Instructions (Signed)
Continue on current regimen  follow up Dr. Chase Caller in 2 months and As needed

## 2014-04-04 NOTE — Assessment & Plan Note (Signed)
Improved w/ pleurx  Cont to drain per directions w/ CTS (twice weekly)  Recent cxr improved  follow up with CTS as planned with CXR

## 2014-04-04 NOTE — Assessment & Plan Note (Signed)
Compensated on present regimen  No changes  

## 2014-04-06 ENCOUNTER — Encounter: Payer: Self-pay | Admitting: Cardiothoracic Surgery

## 2014-04-06 ENCOUNTER — Ambulatory Visit (INDEPENDENT_AMBULATORY_CARE_PROVIDER_SITE_OTHER): Payer: Medicare Other | Admitting: Cardiothoracic Surgery

## 2014-04-06 ENCOUNTER — Ambulatory Visit
Admission: RE | Admit: 2014-04-06 | Discharge: 2014-04-06 | Disposition: A | Discharge status: Home / Self Care | Payer: Medicare Other | Source: Ambulatory Visit | Attending: Cardiothoracic Surgery | Admitting: Cardiothoracic Surgery

## 2014-04-06 VITALS — BP 121/74 | HR 94 | Resp 16 | Ht 67.0 in | Wt 134.0 lb

## 2014-04-06 DIAGNOSIS — Z09 Encounter for follow-up examination after completed treatment for conditions other than malignant neoplasm: Secondary | ICD-10-CM

## 2014-04-06 DIAGNOSIS — J90 Pleural effusion, not elsewhere classified: Secondary | ICD-10-CM

## 2014-04-06 NOTE — Progress Notes (Signed)
PCP is [name], MD Referring Provider is Brand Males, MD  Chief Complaint  Patient presents with  . Routine Post Op    1 month f/u with CXR...left pleurX    HPI: The patient returns for left Pleurx catheter check. The catheter was placed a month ago for a recurrent nonmalignant pleural effusion with shortness of breath. The last 2 or 3 drainage sessions have returned only 50 cc. The patient's shortness of breath and energy level is significantly improved according to his wife.  Chest x-ray today shows the catheter in good position with minimal left pleural effusion.  The Pleurx catheter drained in the office today for less than 5 cc of fluid It appears with improved diet and medical care that his pleural effusion is resolved. Prior to pulling the catheter we l will leave it in  another 2 weeks and reduce the drainage schedule to once per week.   Past Medical History  Diagnosis Date  . Dysrhythmia     atrial fibrilation  . Hypertension   . Shortness of breath   . Claudication of calf muscles 11/05/2012    right calf  . GERD (gastroesophageal reflux disease)   . TIA (transient ischemic attack) 2010  . Peripheral vascular disease   . On home oxygen therapy     "2L; w/activity" (02/24/2014)  . Neuromuscular disorder     dizziness  . Macular degeneration, bilateral     "has had shots in both eyes"  . Coronary artery disease   . COPD (chronic obstructive pulmonary disease)   . Type II diabetes mellitus   . Stroke 05/2013    "can't use left arm fully since"  . Arthritis     "legs" (02/24/2014)  . Pneumonia     "5 times back to back recently (just finished antibiiotic) " (02/24/2014)    Past Surgical History  Procedure Laterality Date  . Foot surgery Left     due to broken foot years ago  . Esophagogastroduodenoscopy N/A 11/02/2013    Procedure: ESOPHAGOGASTRODUODENOSCOPY (EGD);  Surgeon: Cleotis Nipper, MD;  Location: [facility] ENDOSCOPY;  Service: Endoscopy;  Laterality: N/A;   . Vascular surgery      stents to legs  . Cardiac catheterization    . Transurethral resection of prostate    . Femoral artery stent Left 10/2012  . Chest tube insertion Left 02/24/2014    Procedure: INSERTION PLEURAL DRAINAGE CATHETER;  Surgeon: Ivin Poot, MD;  Location: [facility] OR;  Service: Thoracic;  Laterality: Left;    Family History  Problem Relation Age of Onset  . Cancer Neg Hx   . CAD Neg Hx     Social History History  Substance Use Topics  . Smoking status: Former Smoker -- 3.00 packs/day for 35 years    Types: Cigarettes    Quit date: 09/23/1981  . Smokeless tobacco: Never Used  . Alcohol Use: Yes     Comment: QUITS YEARS AGO    Current Outpatient Prescriptions  Medication Sig Dispense Refill  . albuterol (PROVENTIL) (2.5 MG/3ML) 0.083% nebulizer solution Take 3 mLs (2.5 mg total) by nebulization 4 (four) times daily.  75 mL  12  . budesonide (PULMICORT) 0.5 MG/2ML nebulizer solution Take 0.5 mg by nebulization 2 (two) times daily as needed (for wheezing/shortness of breath).       . carvedilol (COREG) 6.25 MG tablet Take 6.25 mg by mouth 2 (two) times daily with a meal.      . cilostazol (PLETAL) 50 MG  tablet Take 50 mg by mouth 2 (two) times daily.      . food thickener (THICK IT) POWD Liquid to be nectar thick  850 g  1  . loratadine (CLARITIN) 10 MG tablet Take 10 mg by mouth daily.      . magnesium oxide (MAG-OX) 400 MG tablet Take 200 mg by mouth daily.      . Melatonin 3 MG TABS Take 3 mg by mouth at bedtime.      . metFORMIN (GLUCOPHAGE) 1000 MG tablet Take 500 mg by mouth 2 (two) times daily with a meal.      . Multiple Vitamins-Minerals (PRESERVISION AREDS PO) Take 1 capsule by mouth daily.       . ranitidine (ZANTAC) 300 MG tablet Take 300 mg by mouth at bedtime.      . simvastatin (ZOCOR) 40 MG tablet Take 40 mg by mouth every evening.      . tiotropium (SPIRIVA) 18 MCG inhalation capsule Place 18 mcg into inhaler and inhale daily.      . traMADol  (ULTRAM) 50 MG tablet Take 1 tablet (50 mg total) by mouth every 6 (six) hours as needed for moderate pain.  30 tablet  1  . vitamin B-12 (CYANOCOBALAMIN) 1000 MCG tablet Take 1,000 mcg by mouth daily.       No current facility-administered medications for this visit.    No Known Allergies  Review of Systems improved appetite improved walking ability decreased shortness of breath  BP 121/74  Pulse 94  Resp 16  Ht 5\' 7"  (1.702 m)  Wt 134 lb (60.782 kg)  BMI 20.98 kg/m2  SpO2 95% Physical Exam Alert and comfortable Breath sounds distant but clear bilaterally Pleurx catheter site clean without erythema or drainage Catheter drained less than 5 cc today  Diagnostic Tests: Chest x-ray shows no significant reaccumulation of left pleural effusion  Impression: Continue catheter drainage at reduced schedule to once a week  Plan: Return for reassessment for catheter removal in 2 weeks

## 2014-04-13 ENCOUNTER — Ambulatory Visit: Payer: Medicare Other | Admitting: Cardiothoracic Surgery

## 2014-04-21 ENCOUNTER — Emergency Department (HOSPITAL_COMMUNITY)
Admission: EM | Admit: 2014-04-21 | Discharge: 2014-04-21 | Disposition: A | Discharge status: Home / Self Care | Payer: Medicare Other | Attending: Emergency Medicine | Admitting: Emergency Medicine

## 2014-04-21 ENCOUNTER — Encounter (HOSPITAL_COMMUNITY): Payer: Self-pay | Admitting: Emergency Medicine

## 2014-04-21 ENCOUNTER — Emergency Department (HOSPITAL_COMMUNITY): Payer: Medicare Other

## 2014-04-21 DIAGNOSIS — M7989 Other specified soft tissue disorders: Secondary | ICD-10-CM | POA: Diagnosis not present

## 2014-04-21 DIAGNOSIS — IMO0002 Reserved for concepts with insufficient information to code with codable children: Secondary | ICD-10-CM | POA: Diagnosis not present

## 2014-04-21 DIAGNOSIS — E119 Type 2 diabetes mellitus without complications: Secondary | ICD-10-CM | POA: Insufficient documentation

## 2014-04-21 DIAGNOSIS — K219 Gastro-esophageal reflux disease without esophagitis: Secondary | ICD-10-CM | POA: Diagnosis not present

## 2014-04-21 DIAGNOSIS — M25462 Effusion, left knee: Secondary | ICD-10-CM

## 2014-04-21 DIAGNOSIS — M109 Gout, unspecified: Secondary | ICD-10-CM

## 2014-04-21 DIAGNOSIS — M25569 Pain in unspecified knee: Secondary | ICD-10-CM | POA: Insufficient documentation

## 2014-04-21 DIAGNOSIS — I2510 Atherosclerotic heart disease of native coronary artery without angina pectoris: Secondary | ICD-10-CM | POA: Diagnosis not present

## 2014-04-21 DIAGNOSIS — I10 Essential (primary) hypertension: Secondary | ICD-10-CM | POA: Insufficient documentation

## 2014-04-21 DIAGNOSIS — Z9981 Dependence on supplemental oxygen: Secondary | ICD-10-CM | POA: Insufficient documentation

## 2014-04-21 DIAGNOSIS — M25469 Effusion, unspecified knee: Secondary | ICD-10-CM | POA: Diagnosis not present

## 2014-04-21 DIAGNOSIS — Z9861 Coronary angioplasty status: Secondary | ICD-10-CM | POA: Insufficient documentation

## 2014-04-21 DIAGNOSIS — Z79899 Other long term (current) drug therapy: Secondary | ICD-10-CM | POA: Insufficient documentation

## 2014-04-21 DIAGNOSIS — J449 Chronic obstructive pulmonary disease, unspecified: Secondary | ICD-10-CM | POA: Insufficient documentation

## 2014-04-21 DIAGNOSIS — I739 Peripheral vascular disease, unspecified: Secondary | ICD-10-CM | POA: Insufficient documentation

## 2014-04-21 DIAGNOSIS — Z87891 Personal history of nicotine dependence: Secondary | ICD-10-CM | POA: Insufficient documentation

## 2014-04-21 DIAGNOSIS — Z8673 Personal history of transient ischemic attack (TIA), and cerebral infarction without residual deficits: Secondary | ICD-10-CM | POA: Insufficient documentation

## 2014-04-21 DIAGNOSIS — Z8701 Personal history of pneumonia (recurrent): Secondary | ICD-10-CM | POA: Diagnosis not present

## 2014-04-21 DIAGNOSIS — J4489 Other specified chronic obstructive pulmonary disease: Secondary | ICD-10-CM | POA: Insufficient documentation

## 2014-04-21 LAB — CBC WITH DIFFERENTIAL/PLATELET
BASOS ABS: 0.1 10*3/uL (ref 0.0–0.1)
Basophils Relative: 1 % (ref 0–1)
Eosinophils Absolute: 0.1 10*3/uL (ref 0.0–0.7)
Eosinophils Relative: 1 % (ref 0–5)
HCT: 34.8 % — ABNORMAL LOW (ref 39.0–52.0)
HEMOGLOBIN: 11.6 g/dL — AB (ref 13.0–17.0)
LYMPHS PCT: 23 % (ref 12–46)
Lymphs Abs: 1.5 10*3/uL (ref 0.7–4.0)
MCH: 31.6 pg (ref 26.0–34.0)
MCHC: 33.3 g/dL (ref 30.0–36.0)
MCV: 94.8 fL (ref 78.0–100.0)
MONO ABS: 2.2 10*3/uL — AB (ref 0.1–1.0)
Monocytes Relative: 35 % — ABNORMAL HIGH (ref 3–12)
NEUTROS PCT: 40 % — AB (ref 43–77)
Neutro Abs: 2.5 10*3/uL (ref 1.7–7.7)
Platelets: 184 10*3/uL (ref 150–400)
RBC: 3.67 MIL/uL — ABNORMAL LOW (ref 4.22–5.81)
RDW: 15.4 % (ref 11.5–15.5)
WBC: 6.4 10*3/uL (ref 4.0–10.5)

## 2014-04-21 LAB — BASIC METABOLIC PANEL
Anion gap: 12 (ref 5–15)
BUN: 27 mg/dL — ABNORMAL HIGH (ref 6–23)
CHLORIDE: 100 meq/L (ref 96–112)
CO2: 27 meq/L (ref 19–32)
Calcium: 9.6 mg/dL (ref 8.4–10.5)
Creatinine, Ser: 0.8 mg/dL (ref 0.50–1.35)
GFR calc non Af Amer: 80 mL/min — ABNORMAL LOW (ref >90)
Glucose, Bld: 101 mg/dL — ABNORMAL HIGH (ref 70–99)
POTASSIUM: 4.6 meq/L (ref 3.7–5.3)
Sodium: 139 mEq/L (ref 137–147)

## 2014-04-21 LAB — SYNOVIAL CELL COUNT + DIFF, W/ CRYSTALS
Lymphocytes-Synovial Fld: 2 % (ref 0–20)
Monocyte-Macrophage-Synovial Fluid: 7 % — ABNORMAL LOW (ref 50–90)
NEUTROPHIL, SYNOVIAL: 91 % — AB (ref 0–25)
WBC, SYNOVIAL: 44220 /mm3 — AB (ref 0–200)

## 2014-04-21 LAB — GRAM STAIN: SPECIAL REQUESTS: NORMAL

## 2014-04-21 MED ORDER — COLCHICINE 0.6 MG PO TABS: 0.6000 mg | ORAL_TABLET | Freq: Two times a day (BID) | ORAL | Status: DC

## 2014-04-21 MED ORDER — COLCHICINE 0.6 MG PO TABS
0.6000 mg | ORAL_TABLET | Freq: Once | ORAL | Status: AC
  Administered 2014-04-21: 0.6 mg via ORAL
  Filled 2014-04-21: qty 1

## 2014-04-21 NOTE — ED Notes (Signed)
Pt states as of two days he has had increased weakness along with swelling to LLE. Accompanied by inability to walk. Skin to L knee is warm to touch, skin tone appropriate for ethnicity.

## 2014-04-21 NOTE — Discharge Instructions (Signed)
Gout Gout is when your joints become red, sore, and swell (inflamed). This is caused by the buildup of uric acid crystals in the joints. Uric acid is a chemical that is normally in the blood. If the level of uric acid gets too high in the blood, these crystals form in your joints and tissues. Over time, these crystals can form into masses near the joints and tissues. These masses can destroy bone and cause the bone to look misshapen (deformed). HOME CARE   Do not take aspirin for pain.  Only take medicine as told by your doctor.  Rest the joint as much as you can. When in bed, keep sheets and blankets off painful areas.  Keep the sore joints raised (elevated).  Put warm or cold packs on painful joints. Use of warm or cold packs depends on which works best for you.  Use crutches if the painful joint is in your leg.  Drink enough fluids to keep your pee (urine) clear or pale yellow. Limit alcohol, sugary drinks, and drinks with fructose in them.  Follow your diet instructions. Pay careful attention to how much protein you eat. Include fruits, vegetables, whole grains, and fat-free or low-fat milk products in your daily diet. Talk to your doctor or dietitian about the use of coffee, vitamin C, and cherries. These may help lower uric acid levels.  Keep a healthy body weight. GET HELP RIGHT AWAY IF:   You have watery poop (diarrhea), throw up (vomit), or have any side effects from medicines.  You do not feel better in 24 hours, or you are getting worse.  Your joint becomes suddenly more tender, and you have chills or a fever. MAKE SURE YOU:   Understand these instructions.  Will watch your condition.  Will get help right away if you are not doing well or get worse. Document Released: 06/18/2008 Document Revised: 01/24/2014 Document Reviewed: 04/22/2012 [facility] Patient Information 2015 [place], [place]. This information is not intended to replace advice given to you by your health care  provider. Make sure you discuss any questions you have with your health care provider. Call Dr. Erlinda Hong to set a follow p appointment for next week Call your PCP to set a follow up appointment with your PCP

## 2014-04-21 NOTE — ED Notes (Signed)
Pt here for left knee pain and swelling. sts LE pain also.

## 2014-04-21 NOTE — ED Provider Notes (Signed)
CSN: [id number]     Arrival date & time 04/21/14  1427 History   First MD Initiated Contact with Patient 04/21/14 [phone]     Chief Complaint  Patient presents with  . Knee Pain  . Leg Swelling     (Consider location/radiation/quality/duration/timing/severity/associated sxs/prior Treatment) HPI  80 yo male with hx of Afib on plavix, HTN, COPD on home o2, recurrent PNA with chest tube placed, here with 2 days of left knee swelling and pain. He noticed left knee swelling and warmth on the knee. He states that he can't move the left leg because of the swelling and pain. Pain radiates down to the ankle with movement. Is unable to walk because of this. Denies fever/chills, chest pain, sob. Chest tube is checked by nurse regularly and drainage has decreased from the previous pneumonia. Denies any back pain, numbness, tingling. Has residual left arm weakness from previous stroke. Denies any rashes or recent travels. Denies hx of gout or any other joint problems.    Past Medical History  Diagnosis Date  . Dysrhythmia     atrial fibrilation  . Hypertension   . Shortness of breath   . Claudication of calf muscles 11/05/2012    right calf  . GERD (gastroesophageal reflux disease)   . TIA (transient ischemic attack) 2010  . Peripheral vascular disease   . On home oxygen therapy     "2L; w/activity" (02/24/2014)  . Neuromuscular disorder     dizziness  . Macular degeneration, bilateral     "has had shots in both eyes"  . Coronary artery disease   . COPD (chronic obstructive pulmonary disease)   . Type II diabetes mellitus   . Stroke 05/2013    "can't use left arm fully since"  . Arthritis     "legs" (02/24/2014)  . Pneumonia     "5 times back to back recently (just finished antibiiotic) " (02/24/2014)   Past Surgical History  Procedure Laterality Date  . Foot surgery Left     due to broken foot years ago  . Esophagogastroduodenoscopy N/A 11/02/2013    Procedure: ESOPHAGOGASTRODUODENOSCOPY  (EGD);  Surgeon: Cleotis Nipper, MD;  Location: [facility] ENDOSCOPY;  Service: Endoscopy;  Laterality: N/A;  . Vascular surgery      stents to legs  . Cardiac catheterization    . Transurethral resection of prostate    . Femoral artery stent Left 10/2012  . Chest tube insertion Left 02/24/2014    Procedure: INSERTION PLEURAL DRAINAGE CATHETER;  Surgeon: Ivin Poot, MD;  Location: [facility] OR;  Service: Thoracic;  Laterality: Left;   Family History  Problem Relation Age of Onset  . Cancer Neg Hx   . CAD Neg Hx    History  Substance Use Topics  . Smoking status: Former Smoker -- 3.00 packs/day for 35 years    Types: Cigarettes    Quit date: 09/23/1981  . Smokeless tobacco: Never Used  . Alcohol Use: Yes     Comment: QUITS YEARS AGO    Review of Systems  Constitutional: Positive for activity change. Negative for fever, chills, appetite change and fatigue.  HENT: Negative for congestion, ear discharge, ear pain, facial swelling, hearing loss, sinus pressure, sore throat and tinnitus.   Eyes: Negative for photophobia, pain, discharge and visual disturbance.  Respiratory: Negative for cough, chest tightness, shortness of breath and wheezing.   Cardiovascular: Negative for chest pain, palpitations and leg swelling.  Gastrointestinal: Negative for nausea, vomiting, abdominal pain, diarrhea, blood in  stool and abdominal distention.  Endocrine: Negative.   Genitourinary: Negative for dysuria, hematuria and difficulty urinating.  Musculoskeletal: Positive for arthralgias, gait problem, joint swelling and myalgias. Negative for back pain, neck pain and neck stiffness.  Skin: Negative.   Allergic/Immunologic: Negative.   Neurological: Positive for dizziness and light-headedness. Negative for tremors, seizures, syncope, speech difficulty, weakness, numbness and headaches.  Hematological: Negative.   Psychiatric/Behavioral: Negative.       Allergies  Review of patient's allergies indicates no  known allergies.  Home Medications   Prior to Admission medications   Medication Sig Start Date End Date Taking? Authorizing Provider  albuterol (PROVENTIL) (2.5 MG/3ML) 0.083% nebulizer solution Take 3 mLs (2.5 mg total) by nebulization 4 (four) times daily. 01/06/14  Yes Theodis Blaze, MD  budesonide (PULMICORT) 0.5 MG/2ML nebulizer solution Take 0.5 mg by nebulization 2 (two) times daily as needed (for wheezing/shortness of breath).    Yes Historical Provider, MD  carvedilol (COREG) 6.25 MG tablet Take 6.25 mg by mouth 2 (two) times daily with a meal.   Yes Historical Provider, MD  cilostazol (PLETAL) 50 MG tablet Take 50 mg by mouth 2 (two) times daily.   Yes Historical Provider, MD  food thickener (THICK IT) POWD Liquid to be nectar thick 02/25/14  Yes Wayne E Gold, PA-C  loratadine (CLARITIN) 10 MG tablet Take 10 mg by mouth daily.   Yes Historical Provider, MD  magnesium oxide (MAG-OX) 400 MG tablet Take 200 mg by mouth daily.   Yes Historical Provider, MD  Melatonin 3 MG TABS Take 3 mg by mouth at bedtime.   Yes Historical Provider, MD  metFORMIN (GLUCOPHAGE) 1000 MG tablet Take 500 mg by mouth 2 (two) times daily with a meal.   Yes Historical Provider, MD  Multiple Vitamins-Minerals (PRESERVISION AREDS PO) Take 1 capsule by mouth daily.    Yes Historical Provider, MD  ranitidine (ZANTAC) 300 MG tablet Take 300 mg by mouth at bedtime.   Yes Historical Provider, MD  simvastatin (ZOCOR) 40 MG tablet Take 40 mg by mouth every evening.   Yes Historical Provider, MD  tiotropium (SPIRIVA) 18 MCG inhalation capsule Place 18 mcg into inhaler and inhale daily.   Yes Historical Provider, MD  traMADol (ULTRAM) 50 MG tablet Take 50 mg by mouth every 6 (six) hours as needed for moderate pain.   Yes Historical Provider, MD  vitamin B-12 (CYANOCOBALAMIN) 1000 MCG tablet Take 1,000 mcg by mouth daily.   Yes Historical Provider, MD   BP 109/59  Pulse 51  Temp(Src) 97.9 F (36.6 C) (Oral)  Resp 14   SpO2 96% Physical Exam  Constitutional: He is oriented to person, place, and time. He appears cachectic. No distress.  HENT:  Head: Normocephalic and atraumatic.  Right Ear: External ear normal.  Left Ear: External ear normal.  Nose: Nose normal.  Mouth/Throat: Oropharynx is clear and moist.  Eyes: Conjunctivae and EOM are normal. Pupils are equal, round, and reactive to light. Right eye exhibits no discharge. Left eye exhibits no discharge. No scleral icterus.  Neck: Normal range of motion. Neck supple. No JVD present. No thyromegaly present.  Cardiovascular: Normal rate, regular rhythm, S1 normal, S2 normal, normal heart sounds and intact distal pulses.  Exam reveals no gallop and no friction rub.   No murmur heard. Pulmonary/Chest: Effort normal and breath sounds normal. No respiratory distress. He has no wheezes. He has no rales. He exhibits no tenderness.  Has chest tube on left lateral chest covered  with gauge.   Abdominal: Soft. Bowel sounds are normal. He exhibits no distension and no mass. There is no tenderness. There is no rebound and no guarding.  Musculoskeletal: He exhibits no edema and no tenderness.       Left knee: He exhibits decreased range of motion, swelling and effusion.  Has 4/5 strength on left arm, 5/5 on right arm.  Has 5/5 str on right leg. 4/5 on left leg.  Left knee joint warm to touch. Tenderness to movement and palpation on left anterior patella.    Lymphadenopathy:    He has no cervical adenopathy.  Neurological: He is alert and oriented to person, place, and time. He has normal strength and normal reflexes. He displays no atrophy and no tremor. No cranial nerve deficit or sensory deficit. He exhibits normal muscle tone. He displays no seizure activity. GCS eye subscore is 4. GCS verbal subscore is 5. GCS motor subscore is 6.  Skin: No rash noted. He is not diaphoretic. No erythema. No pallor.  Psychiatric: He has a normal mood and affect. His behavior is  normal.    ED Course  Procedures (including critical care time) Labs Review Labs Reviewed  CBC WITH DIFFERENTIAL - Abnormal; Notable for the following:    RBC 3.67 (*)    Hemoglobin 11.6 (*)    HCT 34.8 (*)    Neutrophils Relative % 40 (*)    Monocytes Relative 35 (*)    Monocytes Absolute 2.2 (*)    All other components within normal limits  BASIC METABOLIC PANEL - Abnormal; Notable for the following:    Glucose, Bld 101 (*)    BUN 27 (*)    GFR calc non Af Amer 80 (*)    All other components within normal limits  GRAM STAIN  BODY FLUID CULTURE  SYNOVIAL CELL COUNT + DIFF, W/ CRYSTALS  GLUCOSE, SYNOVIAL FLUID    Imaging Review Dg Knee Complete 4 Views Left  04/21/2014   CLINICAL DATA:  Pain and swelling.  Instability.  EXAM: LEFT KNEE - COMPLETE 4+ VIEW  COMPARISON:  None.  FINDINGS: Extensive atherosclerotic calcification. Moderate to large knee effusion. No bony discontinuity to suggest fracture. Mild patellar spurring.  IMPRESSION: 1. Moderate to large knee effusion. 2. Mild patellar spurring. 3. Atherosclerosis.   Electronically Signed   By: Sherryl Barters M.D.   On: 04/21/2014 18:25     EKG Interpretation None      Will get cbc, bmp, and left knee xray.   Knee shows mod to large knee effusion and atherosclerosis.   Will do left knee arthrocentesis and analyze the fluid.  Arthrocentesis showed blood mixed fluid.  Patient is on plavix.  Ordered synovial cell counf+crystals, glucose, gram stain and culture of the fluid.  MDM   Final diagnoses:  Effusion of left knee    Patient is here with 2 days of left knee swelling and pain. Left knee appears swollen on exam and tender to palpation and movement. Joint has warmth. Patient is afebrile, takes plavix. Knee xray shows effusion and atherosclerosis. Performed arthrocentesis. Analysis of the synovial fluid is pending.  Will sign out the patient to the next provider since labs are pending.     Dellia Nims,  MD 04/21/14 2020

## 2014-04-21 NOTE — ED Provider Notes (Signed)
Pt presents to the ED with left knee swelling and pain.  Last night the knee was more swollen and warm to the touch.  Physical Exam  BP 118/84  Pulse 51  Temp(Src) 97.9 F (36.6 C) (Oral)  Resp 16  SpO2 98%  Physical Exam  Nursing note and vitals reviewed. Constitutional: He appears well-developed and well-nourished. No distress.  HENT:  Head: Normocephalic and atraumatic.  Right Ear: External ear normal.  Left Ear: External ear normal.  Eyes: Conjunctivae are normal. Right eye exhibits no discharge. Left eye exhibits no discharge. No scleral icterus.  Neck: Neck supple. No tracheal deviation present.  Cardiovascular: Normal rate.   Pulmonary/Chest: Effort normal. No stridor. No respiratory distress.  Musculoskeletal: He exhibits no edema.       Left knee: He exhibits decreased range of motion, swelling and effusion. He exhibits no ecchymosis and no erythema. Tenderness found.  Neurological: He is alert. Cranial nerve deficit: no gross deficits.  Skin: Skin is warm and dry. No rash noted.  Psychiatric: He has a normal mood and affect.    ED Course  ARTHOCENTESIS Date/Time: 04/21/2014 8:17 PM Performed by: Dorie Rank Authorized by: Dorie Rank Consent: Verbal consent obtained. Risks and benefits: risks, benefits and alternatives were discussed Indications: joint swelling  Body area: knee Joint: left knee Local anesthesia used: yes Anesthesia: local infiltration Local anesthetic: lidocaine 1% with epinephrine Anesthetic total: 5 ml Patient sedated: no Preparation: Patient was prepped and draped in the usual sterile fashion. Needle gauge: 18 G Ultrasound guidance: no Approach: lateral Aspirate: blood-tinged Aspirate amount: 50 ml Patient tolerance: Patient tolerated the procedure well with no immediate complications.    MDM Suspect an inflammatory effusion.  Analysis is pending.  NP Olean Ree will follow up on the results.      Dorie Rank, MD 04/21/14 2018

## 2014-04-22 LAB — GLUCOSE, SYNOVIAL FLUID: Glucose, Synovial Fluid: 54 mg/dL

## 2014-04-25 LAB — BODY FLUID CULTURE
Culture: NO GROWTH
Special Requests: NORMAL

## 2014-04-26 ENCOUNTER — Other Ambulatory Visit: Payer: Self-pay | Admitting: Cardiothoracic Surgery

## 2014-04-26 DIAGNOSIS — I4891 Unspecified atrial fibrillation: Secondary | ICD-10-CM

## 2014-04-27 ENCOUNTER — Other Ambulatory Visit: Payer: Self-pay

## 2014-04-27 ENCOUNTER — Ambulatory Visit (INDEPENDENT_AMBULATORY_CARE_PROVIDER_SITE_OTHER): Payer: Medicare Other | Admitting: Physician Assistant

## 2014-04-27 ENCOUNTER — Ambulatory Visit
Admission: RE | Admit: 2014-04-27 | Discharge: 2014-04-27 | Disposition: A | Discharge status: Home / Self Care | Payer: Medicare Other | Source: Ambulatory Visit | Attending: Cardiothoracic Surgery | Admitting: Cardiothoracic Surgery

## 2014-04-27 VITALS — BP 125/70 | HR 80 | Resp 20 | Ht 67.0 in | Wt 134.0 lb

## 2014-04-27 DIAGNOSIS — J90 Pleural effusion, not elsewhere classified: Secondary | ICD-10-CM

## 2014-04-27 DIAGNOSIS — Z09 Encounter for follow-up examination after completed treatment for conditions other than malignant neoplasm: Secondary | ICD-10-CM

## 2014-04-27 DIAGNOSIS — I4891 Unspecified atrial fibrillation: Secondary | ICD-10-CM

## 2014-04-27 MED ORDER — TRAMADOL HCL 50 MG PO TABS: 50.0000 mg | ORAL_TABLET | Freq: Four times a day (QID) | ORAL | Status: DC | PRN

## 2014-04-27 NOTE — Progress Notes (Signed)
HPI:  Mr. [name] presents today for follow up.  He is S/P Pleur-x catheter placement done in June for recurrent non-malignant pleural effusion.  He was last seen by Dr. Prescott Gum on 04/06/2014 at which time catheter drainage was minimal.  During his visit only 5 cc of output was able to be drained.  It was felt at that time he would continue to drain his catheter once daily and follow up in 2 weeks to possibly schedule removal of the catheter. The patient is doing okay.  He is suffering a gout flare in his knee for which he was evaluated by the ED last week.  He has not been able to drain any fluid from his Pleur-x Catheter over the last 2 weeks.  He is requesting a refill on pain medication.  Finally his wife has questions regarding home PT that she states Dr. Prescott Gum was going to arrange.    Current Outpatient Prescriptions  Medication Sig Dispense Refill  . albuterol (PROVENTIL) (2.5 MG/3ML) 0.083% nebulizer solution Take 3 mLs (2.5 mg total) by nebulization 4 (four) times daily.  75 mL  12  . budesonide (PULMICORT) 0.5 MG/2ML nebulizer solution Take 0.5 mg by nebulization 2 (two) times daily as needed (for wheezing/shortness of breath).       . carvedilol (COREG) 6.25 MG tablet Take 6.25 mg by mouth 2 (two) times daily with a meal.      . cilostazol (PLETAL) 50 MG tablet Take 50 mg by mouth 2 (two) times daily.      . colchicine 0.6 MG tablet Take 1 tablet (0.6 mg total) by mouth 2 (two) times daily.  10 tablet  0  . food thickener (THICK IT) POWD Liquid to be nectar thick  850 g  1  . loratadine (CLARITIN) 10 MG tablet Take 10 mg by mouth daily.      . magnesium oxide (MAG-OX) 400 MG tablet Take 200 mg by mouth daily.      . Melatonin 3 MG TABS Take 3 mg by mouth at bedtime.      . metFORMIN (GLUCOPHAGE) 1000 MG tablet Take 500 mg by mouth 2 (two) times daily with a meal.      . Multiple Vitamins-Minerals (PRESERVISION AREDS PO) Take 1 capsule by mouth daily.       . ranitidine (ZANTAC)  300 MG tablet Take 300 mg by mouth at bedtime.      . simvastatin (ZOCOR) 40 MG tablet Take 40 mg by mouth every evening.      . tiotropium (SPIRIVA) 18 MCG inhalation capsule Place 18 mcg into inhaler and inhale daily.      . traMADol (ULTRAM) 50 MG tablet Take 50 mg by mouth every 6 (six) hours as needed for moderate pain.      . vitamin B-12 (CYANOCOBALAMIN) 1000 MCG tablet Take 1,000 mcg by mouth daily.       No current facility-administered medications for this visit.    Physical Exam:  BP 125/70  Pulse 80  Resp 20  Ht 5\' 7"  (1.702 m)  Wt 134 lb (60.782 kg)  BMI 20.98 kg/m2  SpO2 95%  Gen: no apparent distress Lung: CTA Bilaterally Heart: RRR Wound; clean and dry  Diagnostic Tests:  CXR: good placement of Pleur-x catheter unchanged from previous film.  Minimal left pleural effusion   A/P:  1. Recurrent non- malignant pleural effusion- resolved, Pleur-x catheter has not drained in last 2 weeks 2. Refill Tramadol 50 mg one tablet  by mouth every 6 hours prn pain 3. Dispo- will plan to remove Pleur-x catheter on Tuesday 05/03/2014 at 11:00 in Short Stay

## 2014-04-29 ENCOUNTER — Encounter (HOSPITAL_COMMUNITY): Payer: Self-pay | Admitting: Pharmacy Technician

## 2014-05-03 ENCOUNTER — Encounter (HOSPITAL_COMMUNITY)
Admission: RE | Disposition: A | Discharge status: Home / Self Care | Payer: Self-pay | Source: Ambulatory Visit | Attending: Cardiothoracic Surgery

## 2014-05-03 ENCOUNTER — Ambulatory Visit (HOSPITAL_COMMUNITY)
Admission: RE | Admit: 2014-05-03 | Discharge: 2014-05-03 | Disposition: A | Discharge status: Home / Self Care | Payer: Medicare Other | Source: Ambulatory Visit | Attending: Cardiothoracic Surgery | Admitting: Cardiothoracic Surgery

## 2014-05-03 ENCOUNTER — Other Ambulatory Visit: Payer: Self-pay

## 2014-05-03 DIAGNOSIS — J90 Pleural effusion, not elsewhere classified: Secondary | ICD-10-CM

## 2014-05-03 DIAGNOSIS — I509 Heart failure, unspecified: Secondary | ICD-10-CM | POA: Diagnosis not present

## 2014-05-03 DIAGNOSIS — J910 Malignant pleural effusion: Secondary | ICD-10-CM | POA: Diagnosis not present

## 2014-05-03 DIAGNOSIS — Z4682 Encounter for fitting and adjustment of non-vascular catheter: Secondary | ICD-10-CM | POA: Diagnosis present

## 2014-05-03 HISTORY — PX: REMOVAL OF PLEURAL DRAINAGE CATHETER: SHX5080

## 2014-05-03 SURGERY — REMOVAL, CLOSED DRAINAGE CATHETER SYSTEM, PLEURAL
Anesthesia: LOCAL | Laterality: Left

## 2014-05-03 MED ORDER — LIDOCAINE HCL 2 % IJ SOLN
INTRAMUSCULAR | Status: DC | PRN
  Administered 2014-05-03: 5 mL via INTRADERMAL

## 2014-05-03 NOTE — Progress Notes (Signed)
Spoke with Jolene at [place] office, made her aware that orders need to be put in [facility] chart. Erin Barrett, PA-C will do this procedure. - to remove cathether only.

## 2014-05-03 NOTE — Progress Notes (Signed)
Timeout done, procedure began at 1057, completed at 1105.

## 2014-05-03 NOTE — Procedures (Signed)
Pleur-x Catheter Removal ( left)  Pre Procedure Diagnosis: Resolved, Malignant Pleural Effusion Post Procedure Diagnosis: Resolved, Malignant Pleural Effusion  Procedure:  Time out was performed.  Left side of patient's chest was prepped and draped in sterile fashion.  The area was anesthesized locally with 50ml of 1% Lidocaine without Epinephrine.  Once local anesthesia was achieved blunt dissection was performed to remove adhesions from cuff of Pleurx catheter.  This was done until catheter was free.  Once catheter was removed the incision was closed with 2 interrupted stitches utilizing 3-0 Nylon suture. The patient tolerated the procedure without difficulty.    Blood Loss: Minimal  Follow up: 05/13/2014 at 330 with CXR prior to appointment   Agree with record of the medical procedure as described

## 2014-05-03 NOTE — Discharge Instructions (Signed)
Remove dressing in 24 hrs., may shower at that time, if not bleeding, leave all bandaging off. F/U appt. Given to pt., tramadol for pain at home.

## 2014-05-05 ENCOUNTER — Encounter (HOSPITAL_COMMUNITY): Payer: Self-pay | Admitting: Cardiothoracic Surgery

## 2014-05-11 ENCOUNTER — Other Ambulatory Visit: Payer: Self-pay | Admitting: Cardiothoracic Surgery

## 2014-05-11 DIAGNOSIS — J90 Pleural effusion, not elsewhere classified: Secondary | ICD-10-CM

## 2014-05-12 ENCOUNTER — Ambulatory Visit
Admission: RE | Admit: 2014-05-12 | Discharge: 2014-05-12 | Disposition: A | Discharge status: Home / Self Care | Payer: Medicare Other | Source: Ambulatory Visit | Attending: Cardiothoracic Surgery | Admitting: Cardiothoracic Surgery

## 2014-05-12 ENCOUNTER — Ambulatory Visit (INDEPENDENT_AMBULATORY_CARE_PROVIDER_SITE_OTHER): Payer: Medicare Other | Admitting: Cardiothoracic Surgery

## 2014-05-12 ENCOUNTER — Encounter: Payer: Self-pay | Admitting: Cardiothoracic Surgery

## 2014-05-12 VITALS — BP 108/70 | HR 90 | Resp 20 | Ht 67.0 in | Wt 134.0 lb

## 2014-05-12 DIAGNOSIS — J90 Pleural effusion, not elsewhere classified: Secondary | ICD-10-CM

## 2014-05-12 DIAGNOSIS — Z09 Encounter for follow-up examination after completed treatment for conditions other than malignant neoplasm: Secondary | ICD-10-CM

## 2014-05-12 NOTE — Progress Notes (Signed)
PCP is [name], MD Referring Provider is Brand Males, MD  Chief Complaint  Patient presents with  . Pleural Effusion    2 week f/u with CXR    HPI: The patient presents for routine followup after removal of left Pleurx catheter for nonmalignant effusion.  Patient has had no shortness of breath since Pleurx catheter removal.  A chest x-ray performed today shows no evidence recurrent left pleural effusion. No significant right pleural effusion. COPD changes stable.  The Pleurx incision site is healed and the skin sutures are removed today  Past Medical History  Diagnosis Date  . Dysrhythmia     atrial fibrilation  . Hypertension   . Shortness of breath   . Claudication of calf muscles 11/05/2012    right calf  . GERD (gastroesophageal reflux disease)   . TIA (transient ischemic attack) 2010  . Peripheral vascular disease   . On home oxygen therapy     "2L; w/activity" (02/24/2014)  . Neuromuscular disorder     dizziness  . Macular degeneration, bilateral     "has had shots in both eyes"  . Coronary artery disease   . COPD (chronic obstructive pulmonary disease)   . Type II diabetes mellitus   . Stroke 05/2013    "can't use left arm fully since"  . Arthritis     "legs" (02/24/2014)  . Pneumonia     "5 times back to back recently (just finished antibiiotic) " (02/24/2014)    Past Surgical History  Procedure Laterality Date  . Foot surgery Left     due to broken foot years ago  . Esophagogastroduodenoscopy N/A 11/02/2013    Procedure: ESOPHAGOGASTRODUODENOSCOPY (EGD);  Surgeon: Cleotis Nipper, MD;  Location: [facility] ENDOSCOPY;  Service: Endoscopy;  Laterality: N/A;  . Vascular surgery      stents to legs  . Cardiac catheterization    . Transurethral resection of prostate    . Femoral artery stent Left 10/2012  . Chest tube insertion Left 02/24/2014    Procedure: INSERTION PLEURAL DRAINAGE CATHETER;  Surgeon: Ivin Poot, MD;  Location: [facility] OR;  Service: Thoracic;   Laterality: Left;    Family History  Problem Relation Age of Onset  . Cancer Neg Hx   . CAD Neg Hx     Social History History  Substance Use Topics  . Smoking status: Former Smoker -- 3.00 packs/day for 35 years    Types: Cigarettes    Quit date: 09/23/1981  . Smokeless tobacco: Never Used  . Alcohol Use: Yes     Comment: QUITS YEARS AGO    Current Outpatient Prescriptions  Medication Sig Dispense Refill  . albuterol (PROVENTIL) (2.5 MG/3ML) 0.083% nebulizer solution Take 3 mLs (2.5 mg total) by nebulization 4 (four) times daily.  75 mL  12  . budesonide (PULMICORT) 0.5 MG/2ML nebulizer solution Take 0.5 mg by nebulization 2 (two) times daily as needed (for wheezing/shortness of breath).       . carvedilol (COREG) 6.25 MG tablet Take 6.25 mg by mouth 2 (two) times daily with a meal.      . cilostazol (PLETAL) 50 MG tablet Take 50 mg by mouth 2 (two) times daily.      . colchicine 0.6 MG tablet Take 1 tablet (0.6 mg total) by mouth 2 (two) times daily.  10 tablet  0  . food thickener (THICK IT) POWD Liquid to be nectar thick  850 g  1  . loratadine (CLARITIN) 10 MG tablet Take  10 mg by mouth daily.      . magnesium oxide (MAG-OX) 400 MG tablet Take 200 mg by mouth daily.      . Melatonin 3 MG TABS Take 3 mg by mouth at bedtime.      . metFORMIN (GLUCOPHAGE) 1000 MG tablet Take 500 mg by mouth 2 (two) times daily with a meal.      . Multiple Vitamins-Minerals (PRESERVISION AREDS PO) Take 1 capsule by mouth daily.       . ranitidine (ZANTAC) 300 MG tablet Take 300 mg by mouth at bedtime.      . simvastatin (ZOCOR) 40 MG tablet Take 40 mg by mouth every evening.      . tiotropium (SPIRIVA) 18 MCG inhalation capsule Place 18 mcg into inhaler and inhale daily.      . traMADol (ULTRAM) 50 MG tablet Take 1 tablet (50 mg total) by mouth every 6 (six) hours as needed for moderate pain.  30 tablet  0  . vitamin B-12 (CYANOCOBALAMIN) 1000 MCG tablet Take 1,000 mcg by mouth daily.       No  current facility-administered medications for this visit.    No Known Allergies  Review of Systems patient comfortable sitting in wheelchair accompanied by wife. Oxygen saturation is excellent on room air.  BP 108/70  Pulse 90  Resp 20  Ht 5\' 7"  (1.702 m)  Wt 134 lb (60.782 kg)  BMI 20.98 kg/m2  SpO2 95% Physical Exam Alert and comfortable Breath sounds clear and equal Heart rhythm regular No pedal edema  Pleurx catheter site skin sutures are removed  the site is healed--Band-Aid dressing applied  Diagnostic Tests: Chest x-ray reviewed and results shared with patient and wife  Impression: Resolved recurrent left pleural effusion after Pleurx catheter therapy  Plan: Return as needed

## 2014-05-13 ENCOUNTER — Ambulatory Visit: Payer: Medicare Other | Admitting: Cardiothoracic Surgery

## 2014-06-08 ENCOUNTER — Encounter: Payer: Self-pay | Admitting: Cardiothoracic Surgery

## 2014-07-01 ENCOUNTER — Encounter (HOSPITAL_COMMUNITY): Payer: Self-pay | Admitting: Emergency Medicine

## 2014-07-01 ENCOUNTER — Inpatient Hospital Stay (HOSPITAL_COMMUNITY)
Admission: EM | Admit: 2014-07-01 | Discharge: 2014-07-22 | DRG: 393 | Disposition: A | Discharge status: Home Health | Payer: Medicare Other | Attending: Internal Medicine | Admitting: Internal Medicine

## 2014-07-01 ENCOUNTER — Emergency Department (HOSPITAL_COMMUNITY): Payer: Medicare Other

## 2014-07-01 DIAGNOSIS — K3184 Gastroparesis: Secondary | ICD-10-CM | POA: Diagnosis present

## 2014-07-01 DIAGNOSIS — K6389 Other specified diseases of intestine: Principal | ICD-10-CM | POA: Diagnosis present

## 2014-07-01 DIAGNOSIS — R42 Dizziness and giddiness: Secondary | ICD-10-CM | POA: Diagnosis present

## 2014-07-01 DIAGNOSIS — Z7982 Long term (current) use of aspirin: Secondary | ICD-10-CM

## 2014-07-01 DIAGNOSIS — E871 Hypo-osmolality and hyponatremia: Secondary | ICD-10-CM | POA: Diagnosis not present

## 2014-07-01 DIAGNOSIS — K219 Gastro-esophageal reflux disease without esophagitis: Secondary | ICD-10-CM | POA: Diagnosis present

## 2014-07-01 DIAGNOSIS — Z9981 Dependence on supplemental oxygen: Secondary | ICD-10-CM

## 2014-07-01 DIAGNOSIS — K3189 Other diseases of stomach and duodenum: Secondary | ICD-10-CM | POA: Diagnosis present

## 2014-07-01 DIAGNOSIS — I482 Chronic atrial fibrillation, unspecified: Secondary | ICD-10-CM

## 2014-07-01 DIAGNOSIS — E119 Type 2 diabetes mellitus without complications: Secondary | ICD-10-CM | POA: Diagnosis present

## 2014-07-01 DIAGNOSIS — E11649 Type 2 diabetes mellitus with hypoglycemia without coma: Secondary | ICD-10-CM | POA: Diagnosis not present

## 2014-07-01 DIAGNOSIS — G9340 Encephalopathy, unspecified: Secondary | ICD-10-CM

## 2014-07-01 DIAGNOSIS — I4891 Unspecified atrial fibrillation: Secondary | ICD-10-CM | POA: Diagnosis present

## 2014-07-01 DIAGNOSIS — R531 Weakness: Secondary | ICD-10-CM | POA: Diagnosis present

## 2014-07-01 DIAGNOSIS — E46 Unspecified protein-calorie malnutrition: Secondary | ICD-10-CM

## 2014-07-01 DIAGNOSIS — R633 Feeding difficulties: Secondary | ICD-10-CM | POA: Diagnosis not present

## 2014-07-01 DIAGNOSIS — J90 Pleural effusion, not elsewhere classified: Secondary | ICD-10-CM | POA: Diagnosis present

## 2014-07-01 DIAGNOSIS — R64 Cachexia: Secondary | ICD-10-CM | POA: Diagnosis present

## 2014-07-01 DIAGNOSIS — K311 Adult hypertrophic pyloric stenosis: Secondary | ICD-10-CM

## 2014-07-01 DIAGNOSIS — E876 Hypokalemia: Secondary | ICD-10-CM | POA: Diagnosis present

## 2014-07-01 DIAGNOSIS — I10 Essential (primary) hypertension: Secondary | ICD-10-CM | POA: Diagnosis present

## 2014-07-01 DIAGNOSIS — R935 Abnormal findings on diagnostic imaging of other abdominal regions, including retroperitoneum: Secondary | ICD-10-CM

## 2014-07-01 DIAGNOSIS — Z8673 Personal history of transient ischemic attack (TIA), and cerebral infarction without residual deficits: Secondary | ICD-10-CM

## 2014-07-01 DIAGNOSIS — R112 Nausea with vomiting, unspecified: Secondary | ICD-10-CM

## 2014-07-01 DIAGNOSIS — I739 Peripheral vascular disease, unspecified: Secondary | ICD-10-CM | POA: Diagnosis present

## 2014-07-01 DIAGNOSIS — E860 Dehydration: Secondary | ICD-10-CM | POA: Diagnosis present

## 2014-07-01 DIAGNOSIS — J449 Chronic obstructive pulmonary disease, unspecified: Secondary | ICD-10-CM | POA: Diagnosis present

## 2014-07-01 DIAGNOSIS — K2960 Other gastritis without bleeding: Secondary | ICD-10-CM | POA: Diagnosis present

## 2014-07-01 DIAGNOSIS — R109 Unspecified abdominal pain: Secondary | ICD-10-CM

## 2014-07-01 DIAGNOSIS — K529 Noninfective gastroenteritis and colitis, unspecified: Secondary | ICD-10-CM | POA: Diagnosis present

## 2014-07-01 DIAGNOSIS — M1990 Unspecified osteoarthritis, unspecified site: Secondary | ICD-10-CM | POA: Diagnosis present

## 2014-07-01 DIAGNOSIS — H3530 Unspecified macular degeneration: Secondary | ICD-10-CM | POA: Diagnosis present

## 2014-07-01 DIAGNOSIS — R1110 Vomiting, unspecified: Secondary | ICD-10-CM

## 2014-07-01 DIAGNOSIS — I69398 Other sequelae of cerebral infarction: Secondary | ICD-10-CM

## 2014-07-01 DIAGNOSIS — N179 Acute kidney failure, unspecified: Secondary | ICD-10-CM | POA: Diagnosis not present

## 2014-07-01 DIAGNOSIS — E875 Hyperkalemia: Secondary | ICD-10-CM | POA: Diagnosis not present

## 2014-07-01 DIAGNOSIS — Z6822 Body mass index (BMI) 22.0-22.9, adult: Secondary | ICD-10-CM

## 2014-07-01 DIAGNOSIS — Z87891 Personal history of nicotine dependence: Secondary | ICD-10-CM

## 2014-07-01 DIAGNOSIS — Z9229 Personal history of other drug therapy: Secondary | ICD-10-CM

## 2014-07-01 DIAGNOSIS — E870 Hyperosmolality and hypernatremia: Secondary | ICD-10-CM | POA: Diagnosis not present

## 2014-07-01 DIAGNOSIS — K30 Functional dyspepsia: Secondary | ICD-10-CM | POA: Diagnosis present

## 2014-07-01 DIAGNOSIS — I2510 Atherosclerotic heart disease of native coronary artery without angina pectoris: Secondary | ICD-10-CM | POA: Diagnosis present

## 2014-07-01 DIAGNOSIS — E43 Unspecified severe protein-calorie malnutrition: Secondary | ICD-10-CM

## 2014-07-01 LAB — I-STAT CHEM 8, ED
BUN: 24 mg/dL — AB (ref 6–23)
Calcium, Ion: 1.05 mmol/L — ABNORMAL LOW (ref 1.13–1.30)
Chloride: 97 mEq/L (ref 96–112)
Creatinine, Ser: 1.1 mg/dL (ref 0.50–1.35)
Glucose, Bld: 157 mg/dL — ABNORMAL HIGH (ref 70–99)
HCT: 36 % — ABNORMAL LOW (ref 39.0–52.0)
Hemoglobin: 12.2 g/dL — ABNORMAL LOW (ref 13.0–17.0)
Potassium: 3.2 mEq/L — ABNORMAL LOW (ref 3.7–5.3)
Sodium: 146 mEq/L (ref 137–147)
TCO2: 32 mmol/L (ref 0–100)

## 2014-07-01 LAB — CBC WITH DIFFERENTIAL/PLATELET
BASOS PCT: 0 % (ref 0–1)
Basophils Absolute: 0 10*3/uL (ref 0.0–0.1)
Eosinophils Absolute: 0 10*3/uL (ref 0.0–0.7)
Eosinophils Relative: 0 % (ref 0–5)
HEMATOCRIT: 34 % — AB (ref 39.0–52.0)
Hemoglobin: 11.5 g/dL — ABNORMAL LOW (ref 13.0–17.0)
LYMPHS PCT: 14 % (ref 12–46)
Lymphs Abs: 1 10*3/uL (ref 0.7–4.0)
MCH: 32.1 pg (ref 26.0–34.0)
MCHC: 33.8 g/dL (ref 30.0–36.0)
MCV: 95 fL (ref 78.0–100.0)
MONO ABS: 1.1 10*3/uL — AB (ref 0.1–1.0)
Monocytes Relative: 16 % — ABNORMAL HIGH (ref 3–12)
Neutro Abs: 5.1 10*3/uL (ref 1.7–7.7)
Neutrophils Relative %: 70 % (ref 43–77)
Platelets: 281 10*3/uL (ref 150–400)
RBC: 3.58 MIL/uL — ABNORMAL LOW (ref 4.22–5.81)
RDW: 13.4 % (ref 11.5–15.5)
WBC: 7.3 10*3/uL (ref 4.0–10.5)

## 2014-07-01 LAB — COMPREHENSIVE METABOLIC PANEL
ALT: 30 U/L (ref 0–53)
ANION GAP: 20 — AB (ref 5–15)
AST: 45 U/L — ABNORMAL HIGH (ref 0–37)
Albumin: 3.4 g/dL — ABNORMAL LOW (ref 3.5–5.2)
Alkaline Phosphatase: 124 U/L — ABNORMAL HIGH (ref 39–117)
BUN: 24 mg/dL — AB (ref 6–23)
CALCIUM: 9.4 mg/dL (ref 8.4–10.5)
CO2: 32 mEq/L (ref 19–32)
Chloride: 97 mEq/L (ref 96–112)
Creatinine, Ser: 1.06 mg/dL (ref 0.50–1.35)
GFR calc Af Amer: 72 mL/min — ABNORMAL LOW (ref >90)
GFR calc non Af Amer: 62 mL/min — ABNORMAL LOW (ref >90)
GLUCOSE: 150 mg/dL — AB (ref 70–99)
Potassium: 3.5 mEq/L — ABNORMAL LOW (ref 3.7–5.3)
SODIUM: 149 meq/L — AB (ref 137–147)
Total Bilirubin: 0.7 mg/dL (ref 0.3–1.2)
Total Protein: 7.9 g/dL (ref 6.0–8.3)

## 2014-07-01 MED ORDER — METOCLOPRAMIDE HCL 5 MG/ML IJ SOLN
10.0000 mg | Freq: Once | INTRAMUSCULAR | Status: AC
  Administered 2014-07-02: 10 mg via INTRAVENOUS
  Filled 2014-07-01: qty 2

## 2014-07-01 MED ORDER — ONDANSETRON HCL 4 MG/2ML IJ SOLN
4.0000 mg | Freq: Once | INTRAMUSCULAR | Status: AC
  Administered 2014-07-01: 4 mg via INTRAVENOUS
  Filled 2014-07-01: qty 2

## 2014-07-01 MED ORDER — POTASSIUM CHLORIDE 10 MEQ/100ML IV SOLN
10.0000 meq | INTRAVENOUS | Status: DC
  Administered 2014-07-02 (×2): 10 meq via INTRAVENOUS
  Filled 2014-07-01 (×2): qty 100

## 2014-07-01 MED ORDER — SODIUM CHLORIDE 0.9 % IV SOLN
Freq: Once | INTRAVENOUS | Status: AC
  Administered 2014-07-01: 23:00:00 via INTRAVENOUS

## 2014-07-01 MED ORDER — DEXTROSE 5 % IV SOLN: 1.0000 g | Freq: Once | INTRAVENOUS | Status: DC

## 2014-07-01 NOTE — ED Provider Notes (Signed)
CSN: [id number]     Arrival date & time 07/01/14  2201 History   First MD Initiated Contact with Patient 07/01/14 2241     Chief Complaint  Patient presents with  . Emesis     (Consider location/radiation/quality/duration/timing/severity/associated sxs/prior Treatment) HPI Comments: Ill appearing elderly gentleman states this morning her developed N/V/D  Was given imodium with complete resolution of diarrhea but has had persistent vomiting.  Has been given OTC antemetic without relief.   Patient is a 80 y.o. male presenting with vomiting. The history is provided by the patient.  Emesis Severity:  Moderate Timing:  Intermittent Number of daily episodes:  Multiple Quality:  Bilious material Progression:  Unchanged Chronicity:  New Relieved by:  Nothing Worsened by:  Nothing tried Ineffective treatments: OTC antiemetic. Associated symptoms: abdominal pain and diarrhea   Associated symptoms: no arthralgias, no chills, no cough, no fever, no headaches, no myalgias and no URI   Abdominal pain:    Location:  Generalized   Quality:  Cramping   Severity:  Mild   Onset quality:  Sudden   Duration:  12 hours   Timing:  Intermittent   Progression:  Unchanged   Chronicity:  New Diarrhea:    Quality:  Semi-solid   Number of occurrences:  2   Severity:  Mild   Progression:  Resolved (given imodium) Risk factors: no diabetes, no sick contacts and no travel to endemic areas     Past Medical History  Diagnosis Date  . Dysrhythmia     atrial fibrilation  . Hypertension   . Shortness of breath   . Claudication of calf muscles 11/05/2012    right calf  . GERD (gastroesophageal reflux disease)   . TIA (transient ischemic attack) 2010  . Peripheral vascular disease   . On home oxygen therapy     "2L; w/activity" (02/24/2014)  . Neuromuscular disorder     dizziness  . Macular degeneration, bilateral     "has had shots in both eyes"  . Coronary artery disease   . COPD (chronic  obstructive pulmonary disease)   . Type II diabetes mellitus   . Stroke 05/2013    "can't use left arm fully since"  . Arthritis     "legs" (02/24/2014)  . Pneumonia     "5 times back to back recently (just finished antibiiotic) " (02/24/2014)   Past Surgical History  Procedure Laterality Date  . Removal of pleural drainage catheter Left 05/03/2014    Procedure: REMOVAL OF PLEURAL DRAINAGE CATHETER;  Surgeon: Ivin Poot, MD;  Location: [facility];  Service: Thoracic;  Laterality: Left;  . Foot surgery Left     due to broken foot years ago  . Esophagogastroduodenoscopy N/A 11/02/2013    Procedure: ESOPHAGOGASTRODUODENOSCOPY (EGD);  Surgeon: Cleotis Nipper, MD;  Location: [facility] ENDOSCOPY;  Service: Endoscopy;  Laterality: N/A;  . Vascular surgery      stents to legs  . Cardiac catheterization    . Transurethral resection of prostate    . Femoral artery stent Left 10/2012  . Chest tube insertion Left 02/24/2014    Procedure: INSERTION PLEURAL DRAINAGE CATHETER;  Surgeon: Ivin Poot, MD;  Location: [facility] OR;  Service: Thoracic;  Laterality: Left;   Family History  Problem Relation Age of Onset  . Cancer Neg Hx   . CAD Neg Hx    History  Substance Use Topics  . Smoking status: Former Smoker -- 3.00 packs/day for 35 years    Types: Cigarettes  Quit date: 09/23/1981  . Smokeless tobacco: Never Used  . Alcohol Use: Yes     Comment: QUITS YEARS AGO    Review of Systems  Constitutional: Negative for fever and chills.  Respiratory: Negative for shortness of breath.   Gastrointestinal: Positive for nausea, vomiting, abdominal pain and diarrhea. Negative for constipation and blood in stool.  Genitourinary: Negative for dysuria.  Musculoskeletal: Negative for arthralgias and myalgias.  Skin: Positive for pallor. Negative for wound.  Neurological: Negative for dizziness and headaches.  All other systems reviewed and are negative.     Allergies  Review of patient's allergies indicates  no known allergies.  Home Medications   Prior to Admission medications   Medication Sig Start Date End Date Taking? Authorizing Provider  albuterol (PROVENTIL) (2.5 MG/3ML) 0.083% nebulizer solution Take 2.5 mg by nebulization 4 (four) times daily.   Yes Historical Provider, MD  allopurinol (ZYLOPRIM) 100 MG tablet Take 100 mg by mouth 2 (two) times daily.  06/28/14  Yes Historical Provider, MD  budesonide (PULMICORT) 0.5 MG/2ML nebulizer solution Take 0.5 mg by nebulization 2 (two) times daily as needed (for wheezing/shortness of breath).    Yes Historical Provider, MD  carvedilol (COREG) 6.25 MG tablet Take 6.25 mg by mouth 2 (two) times daily with a meal.   Yes Historical Provider, MD  cilostazol (PLETAL) 50 MG tablet Take 50 mg by mouth 2 (two) times daily.   Yes Historical Provider, MD  colchicine 0.6 MG tablet Take 1 tablet (0.6 mg total) by mouth 2 (two) times daily. 04/21/14  Yes Garald Balding, NP  loratadine (CLARITIN) 10 MG tablet Take 10 mg by mouth daily.   Yes Historical Provider, MD  loratadine (CLARITIN) 10 MG tablet Take 10 mg by mouth daily.   Yes Historical Provider, MD  magnesium oxide (MAG-OX) 400 MG tablet Take 200 mg by mouth daily.   Yes Historical Provider, MD  Melatonin 3 MG TABS Take 3 mg by mouth at bedtime.   Yes Historical Provider, MD  metFORMIN (GLUCOPHAGE) 1000 MG tablet Take 500 mg by mouth 2 (two) times daily with a meal.   Yes Historical Provider, MD  Multiple Vitamins-Minerals (OCUVITE PRESERVISION PO) Take 1 tablet by mouth daily.   Yes Historical Provider, MD  ranitidine (ZANTAC) 300 MG tablet Take 300 mg by mouth at bedtime.   Yes Historical Provider, MD  simvastatin (ZOCOR) 40 MG tablet Take 40 mg by mouth every evening.   Yes Historical Provider, MD  STARCH-MALTO DEXTRIN (THICK-IT) POWD 1 scoop by Other route 3 (three) times daily with meals. To make liquids nectar thick   Yes Historical Provider, MD  tiotropium (SPIRIVA) 18 MCG inhalation capsule Place 18  mcg into inhaler and inhale daily.   Yes Historical Provider, MD  traMADol (ULTRAM) 50 MG tablet Take 1 tablet (50 mg total) by mouth every 6 (six) hours as needed for moderate pain. 04/27/14  Yes Erin Barrett, PA-C  vitamin B-12 (CYANOCOBALAMIN) 1000 MCG tablet Take 1,000 mcg by mouth daily.   Yes Historical Provider, MD   BP 170/83  Pulse 87  Temp(Src) 98.3 F (36.8 C) (Oral)  Resp 25  SpO2 94% Physical Exam  Constitutional: He appears cachectic. He appears ill.  HENT:  Head: Normocephalic.  Eyes: Pupils are equal, round, and reactive to light.  Neck: Normal range of motion.  Cardiovascular: Regular rhythm.  Tachycardia present.   No murmur heard. Pulmonary/Chest: Effort normal and breath sounds normal. No respiratory distress. He has no wheezes.  Abdominal: He exhibits no distension. Bowel sounds are decreased. There is generalized tenderness. There is no guarding.  Musculoskeletal: Normal range of motion.  Neurological: He is alert.  Skin: Skin is warm and dry. There is pallor.    ED Course  Procedures (including critical care time) Labs Review Labs Reviewed  CBC WITH DIFFERENTIAL - Abnormal; Notable for the following:    RBC 3.58 (*)    Hemoglobin 11.5 (*)    HCT 34.0 (*)    Monocytes Relative 16 (*)    Monocytes Absolute 1.1 (*)    All other components within normal limits  COMPREHENSIVE METABOLIC PANEL - Abnormal; Notable for the following:    Sodium 149 (*)    Potassium 3.5 (*)    Glucose, Bld 150 (*)    BUN 24 (*)    Albumin 3.4 (*)    AST 45 (*)    Alkaline Phosphatase 124 (*)    GFR calc non Af Amer 62 (*)    GFR calc Af Amer 72 (*)    Anion gap 20 (*)    All other components within normal limits  I-STAT CHEM 8, ED - Abnormal; Notable for the following:    Potassium 3.2 (*)    BUN 24 (*)    Glucose, Bld 157 (*)    Calcium, Ion 1.05 (*)    Hemoglobin 12.2 (*)    HCT 36.0 (*)    All other components within normal limits  URINALYSIS, ROUTINE W REFLEX  MICROSCOPIC  MAGNESIUM    Imaging Review Dg Chest 2 View  07/01/2014   CLINICAL DATA:  Acute onset of nausea, vomiting and diarrhea. Generalized weakness and difficulty swallowing.  EXAM: CHEST  2 VIEW  COMPARISON:  Chest radiograph performed 05/12/2014  FINDINGS: The lungs are well-aerated. Left basilar airspace opacification raises concern for mild pneumonia. A calcified granuloma is again noted at the right lung base. No definite pleural effusion or pneumothorax is seen.  The heart is borderline normal in size. No acute osseous abnormalities are seen.  IMPRESSION: Left basilar airspace opacification raises concern for mild pneumonia.   Electronically Signed   By: Garald Balding M.D.   On: 07/01/2014 23:31     EKG Interpretation None     No CP, SOB occasional cough  Clinically does not have pneumonia despite radiology reading-- has Hx of chronic L  plural effusion requiring periodic drainage last being 8/11 Continues to vomit despite IV Zofran  MDM   Final diagnoses:  Intractable vomiting with nausea, vomiting of unspecified type  Hypokalemia         Garald Balding, NP 07/02/14 2110

## 2014-07-01 NOTE — ED Notes (Signed)
Patient with vomiting multiple times since last night.  Patient states that he did not see the color of his emesis.  Patient states that he has also been having diarrhea with the nausea and vomiting.

## 2014-07-01 NOTE — ED Provider Notes (Signed)
Complains of multiple episodes ofvomiting and multiple episodes ofdiarrhea onset yesterday. He denies abdominal pain denies chest pain denies shortness of breath. He does admit to occasional cough, though rare.on exam chronically ill-appearing. Nontoxic. Lungs clear auscultation speaks in paragraphs abdomen soft nontender  Orlie Dakin, MD 07/02/14 0002

## 2014-07-01 NOTE — ED Notes (Signed)
Pt to xray

## 2014-07-02 ENCOUNTER — Inpatient Hospital Stay (HOSPITAL_COMMUNITY): Payer: Medicare Other

## 2014-07-02 ENCOUNTER — Encounter (HOSPITAL_COMMUNITY): Payer: Self-pay | Admitting: Internal Medicine

## 2014-07-02 DIAGNOSIS — E876 Hypokalemia: Secondary | ICD-10-CM | POA: Diagnosis present

## 2014-07-02 DIAGNOSIS — E871 Hypo-osmolality and hyponatremia: Secondary | ICD-10-CM | POA: Diagnosis not present

## 2014-07-02 DIAGNOSIS — K3184 Gastroparesis: Secondary | ICD-10-CM | POA: Diagnosis present

## 2014-07-02 DIAGNOSIS — R633 Feeding difficulties: Secondary | ICD-10-CM | POA: Diagnosis not present

## 2014-07-02 DIAGNOSIS — E119 Type 2 diabetes mellitus without complications: Secondary | ICD-10-CM

## 2014-07-02 DIAGNOSIS — G9340 Encephalopathy, unspecified: Secondary | ICD-10-CM | POA: Diagnosis not present

## 2014-07-02 DIAGNOSIS — I482 Chronic atrial fibrillation: Secondary | ICD-10-CM

## 2014-07-02 DIAGNOSIS — E860 Dehydration: Secondary | ICD-10-CM

## 2014-07-02 DIAGNOSIS — R1110 Vomiting, unspecified: Secondary | ICD-10-CM

## 2014-07-02 DIAGNOSIS — N179 Acute kidney failure, unspecified: Secondary | ICD-10-CM | POA: Diagnosis not present

## 2014-07-02 DIAGNOSIS — H3530 Unspecified macular degeneration: Secondary | ICD-10-CM | POA: Diagnosis present

## 2014-07-02 DIAGNOSIS — E875 Hyperkalemia: Secondary | ICD-10-CM | POA: Diagnosis not present

## 2014-07-02 DIAGNOSIS — E870 Hyperosmolality and hypernatremia: Secondary | ICD-10-CM | POA: Diagnosis not present

## 2014-07-02 DIAGNOSIS — K219 Gastro-esophageal reflux disease without esophagitis: Secondary | ICD-10-CM | POA: Diagnosis present

## 2014-07-02 DIAGNOSIS — R64 Cachexia: Secondary | ICD-10-CM | POA: Diagnosis present

## 2014-07-02 DIAGNOSIS — K529 Noninfective gastroenteritis and colitis, unspecified: Secondary | ICD-10-CM | POA: Diagnosis present

## 2014-07-02 DIAGNOSIS — I2510 Atherosclerotic heart disease of native coronary artery without angina pectoris: Secondary | ICD-10-CM | POA: Diagnosis present

## 2014-07-02 DIAGNOSIS — Z7982 Long term (current) use of aspirin: Secondary | ICD-10-CM | POA: Diagnosis not present

## 2014-07-02 DIAGNOSIS — M1990 Unspecified osteoarthritis, unspecified site: Secondary | ICD-10-CM | POA: Diagnosis present

## 2014-07-02 DIAGNOSIS — J90 Pleural effusion, not elsewhere classified: Secondary | ICD-10-CM | POA: Diagnosis not present

## 2014-07-02 DIAGNOSIS — K6389 Other specified diseases of intestine: Secondary | ICD-10-CM | POA: Diagnosis not present

## 2014-07-02 DIAGNOSIS — E11649 Type 2 diabetes mellitus with hypoglycemia without coma: Secondary | ICD-10-CM | POA: Diagnosis not present

## 2014-07-02 DIAGNOSIS — R531 Weakness: Secondary | ICD-10-CM | POA: Diagnosis present

## 2014-07-02 DIAGNOSIS — Z6822 Body mass index (BMI) 22.0-22.9, adult: Secondary | ICD-10-CM | POA: Diagnosis not present

## 2014-07-02 DIAGNOSIS — I739 Peripheral vascular disease, unspecified: Secondary | ICD-10-CM | POA: Diagnosis present

## 2014-07-02 DIAGNOSIS — Z9229 Personal history of other drug therapy: Secondary | ICD-10-CM | POA: Diagnosis not present

## 2014-07-02 DIAGNOSIS — R42 Dizziness and giddiness: Secondary | ICD-10-CM | POA: Diagnosis present

## 2014-07-02 DIAGNOSIS — Z87891 Personal history of nicotine dependence: Secondary | ICD-10-CM | POA: Diagnosis not present

## 2014-07-02 DIAGNOSIS — Z8673 Personal history of transient ischemic attack (TIA), and cerebral infarction without residual deficits: Secondary | ICD-10-CM | POA: Diagnosis not present

## 2014-07-02 DIAGNOSIS — K30 Functional dyspepsia: Secondary | ICD-10-CM | POA: Diagnosis present

## 2014-07-02 DIAGNOSIS — I10 Essential (primary) hypertension: Secondary | ICD-10-CM | POA: Diagnosis present

## 2014-07-02 DIAGNOSIS — K2960 Other gastritis without bleeding: Secondary | ICD-10-CM | POA: Diagnosis present

## 2014-07-02 DIAGNOSIS — J449 Chronic obstructive pulmonary disease, unspecified: Secondary | ICD-10-CM | POA: Diagnosis present

## 2014-07-02 DIAGNOSIS — I69398 Other sequelae of cerebral infarction: Secondary | ICD-10-CM | POA: Diagnosis not present

## 2014-07-02 DIAGNOSIS — Z9981 Dependence on supplemental oxygen: Secondary | ICD-10-CM | POA: Diagnosis not present

## 2014-07-02 LAB — COMPREHENSIVE METABOLIC PANEL
ALBUMIN: 3 g/dL — AB (ref 3.5–5.2)
ALT: 27 U/L (ref 0–53)
AST: 41 U/L — AB (ref 0–37)
Alkaline Phosphatase: 113 U/L (ref 39–117)
Anion gap: 26 — ABNORMAL HIGH (ref 5–15)
BUN: 27 mg/dL — ABNORMAL HIGH (ref 6–23)
CALCIUM: 9 mg/dL (ref 8.4–10.5)
CO2: 25 mEq/L (ref 19–32)
Chloride: 96 mEq/L (ref 96–112)
Creatinine, Ser: 1.08 mg/dL (ref 0.50–1.35)
GFR calc Af Amer: 71 mL/min — ABNORMAL LOW (ref >90)
GFR calc non Af Amer: 61 mL/min — ABNORMAL LOW (ref >90)
Glucose, Bld: 180 mg/dL — ABNORMAL HIGH (ref 70–99)
Potassium: 3.8 mEq/L (ref 3.7–5.3)
SODIUM: 147 meq/L (ref 137–147)
Total Bilirubin: 0.5 mg/dL (ref 0.3–1.2)
Total Protein: 7 g/dL (ref 6.0–8.3)

## 2014-07-02 LAB — GLUCOSE, CAPILLARY
GLUCOSE-CAPILLARY: 144 mg/dL — AB (ref 70–99)
GLUCOSE-CAPILLARY: 184 mg/dL — AB (ref 70–99)
GLUCOSE-CAPILLARY: 132 mg/dL — AB (ref 70–99)
GLUCOSE-CAPILLARY: 147 mg/dL — AB (ref 70–99)
Glucose-Capillary: 149 mg/dL — ABNORMAL HIGH (ref 70–99)

## 2014-07-02 LAB — URINALYSIS, ROUTINE W REFLEX MICROSCOPIC
GLUCOSE, UA: NEGATIVE mg/dL
HGB URINE DIPSTICK: NEGATIVE
Ketones, ur: 15 mg/dL — AB
Leukocytes, UA: NEGATIVE
Nitrite: NEGATIVE
PH: 5 (ref 5.0–8.0)
Protein, ur: 30 mg/dL — AB
SPECIFIC GRAVITY, URINE: 1.015 (ref 1.005–1.030)
UROBILINOGEN UA: 0.2 mg/dL (ref 0.0–1.0)

## 2014-07-02 LAB — HEMOGLOBIN A1C
Hgb A1c MFr Bld: 6.2 % — ABNORMAL HIGH (ref <5.7)
MEAN PLASMA GLUCOSE: 131 mg/dL — AB (ref <117)

## 2014-07-02 LAB — MAGNESIUM
MAGNESIUM: 1.8 mg/dL (ref 1.5–2.5)
Magnesium: 1.6 mg/dL (ref 1.5–2.5)

## 2014-07-02 LAB — URINE MICROSCOPIC-ADD ON

## 2014-07-02 LAB — TSH: TSH: 5.25 u[IU]/mL — AB (ref 0.350–4.500)

## 2014-07-02 LAB — CBC
HEMATOCRIT: 34.9 % — AB (ref 39.0–52.0)
Hemoglobin: 11.6 g/dL — ABNORMAL LOW (ref 13.0–17.0)
MCH: 32.4 pg (ref 26.0–34.0)
MCHC: 33.2 g/dL (ref 30.0–36.0)
MCV: 97.5 fL (ref 78.0–100.0)
PLATELETS: 241 10*3/uL (ref 150–400)
RBC: 3.58 MIL/uL — AB (ref 4.22–5.81)
RDW: 13.8 % (ref 11.5–15.5)
WBC: 7.2 10*3/uL (ref 4.0–10.5)

## 2014-07-02 LAB — PHOSPHORUS: PHOSPHORUS: 5.9 mg/dL — AB (ref 2.3–4.6)

## 2014-07-02 MED ORDER — CETYLPYRIDINIUM CHLORIDE 0.05 % MT LIQD
7.0000 mL | Freq: Two times a day (BID) | OROMUCOSAL | Status: DC
Start: 2014-07-02 — End: 2014-07-22
  Administered 2014-07-02 – 2014-07-22 (×41): 7 mL via OROMUCOSAL

## 2014-07-02 MED ORDER — GUAIFENESIN ER 600 MG PO TB12
600.0000 mg | ORAL_TABLET | Freq: Two times a day (BID) | ORAL | Status: DC
  Filled 2014-07-02 (×2): qty 1

## 2014-07-02 MED ORDER — ONDANSETRON HCL 4 MG PO TABS
4.0000 mg | ORAL_TABLET | Freq: Four times a day (QID) | ORAL | Status: DC | PRN
Start: 2014-07-02 — End: 2014-07-22

## 2014-07-02 MED ORDER — IOHEXOL 300 MG/ML  SOLN
100.0000 mL | Freq: Once | INTRAMUSCULAR | Status: AC | PRN
  Administered 2014-07-02: 100 mL via INTRAVENOUS

## 2014-07-02 MED ORDER — ACETAMINOPHEN 325 MG PO TABS: 650.0000 mg | ORAL_TABLET | Freq: Four times a day (QID) | ORAL | Status: DC | PRN

## 2014-07-02 MED ORDER — SIMVASTATIN 40 MG PO TABS
40.0000 mg | ORAL_TABLET | Freq: Every evening | ORAL | Status: DC
  Administered 2014-07-02 – 2014-07-05 (×4): 40 mg via ORAL
  Filled 2014-07-02 (×5): qty 1

## 2014-07-02 MED ORDER — ACETAMINOPHEN 650 MG RE SUPP
650.0000 mg | Freq: Four times a day (QID) | RECTAL | Status: DC | PRN
  Administered 2014-07-07 – 2014-07-16 (×2): 650 mg via RECTAL
  Filled 2014-07-02 (×2): qty 1

## 2014-07-02 MED ORDER — FAMOTIDINE 20 MG PO TABS
20.0000 mg | ORAL_TABLET | Freq: Every day | ORAL | Status: DC
  Filled 2014-07-02: qty 1

## 2014-07-02 MED ORDER — BUDESONIDE 0.5 MG/2ML IN SUSP
0.5000 mg | Freq: Two times a day (BID) | RESPIRATORY_TRACT | Status: DC | PRN
  Filled 2014-07-02: qty 2

## 2014-07-02 MED ORDER — LORATADINE 10 MG PO TABS
10.0000 mg | ORAL_TABLET | Freq: Every day | ORAL | Status: DC
  Filled 2014-07-02: qty 1

## 2014-07-02 MED ORDER — DOCUSATE SODIUM 100 MG PO CAPS
100.0000 mg | ORAL_CAPSULE | Freq: Two times a day (BID) | ORAL | Status: DC
  Filled 2014-07-02 (×2): qty 1

## 2014-07-02 MED ORDER — INSULIN ASPART 100 UNIT/ML ~~LOC~~ SOLN
0.0000 [IU] | SUBCUTANEOUS | Status: DC
  Administered 2014-07-02: 1 [IU] via SUBCUTANEOUS
  Administered 2014-07-02: 2 [IU] via SUBCUTANEOUS
  Administered 2014-07-02 – 2014-07-03 (×4): 1 [IU] via SUBCUTANEOUS
  Administered 2014-07-03: 2 [IU] via SUBCUTANEOUS
  Administered 2014-07-04: 1 [IU] via SUBCUTANEOUS
  Administered 2014-07-04: 2 [IU] via SUBCUTANEOUS
  Administered 2014-07-04 – 2014-07-06 (×8): 1 [IU] via SUBCUTANEOUS
  Administered 2014-07-07 – 2014-07-08 (×6): 2 [IU] via SUBCUTANEOUS
  Administered 2014-07-08 (×3): 3 [IU] via SUBCUTANEOUS
  Administered 2014-07-08: 2 [IU] via SUBCUTANEOUS
  Administered 2014-07-09 – 2014-07-10 (×4): 1 [IU] via SUBCUTANEOUS

## 2014-07-02 MED ORDER — PIPERACILLIN-TAZOBACTAM 3.375 G IVPB
3.3750 g | Freq: Three times a day (TID) | INTRAVENOUS | Status: DC
  Administered 2014-07-02 – 2014-07-10 (×24): 3.375 g via INTRAVENOUS
  Filled 2014-07-02 (×28): qty 50

## 2014-07-02 MED ORDER — TIOTROPIUM BROMIDE MONOHYDRATE 18 MCG IN CAPS
18.0000 ug | ORAL_CAPSULE | Freq: Every day | RESPIRATORY_TRACT | Status: DC
  Administered 2014-07-02 – 2014-07-22 (×18): 18 ug via RESPIRATORY_TRACT
  Filled 2014-07-02 (×4): qty 5

## 2014-07-02 MED ORDER — MORPHINE SULFATE 2 MG/ML IJ SOLN
2.0000 mg | INTRAMUSCULAR | Status: DC | PRN
  Administered 2014-07-04 – 2014-07-17 (×7): 2 mg via INTRAVENOUS
  Filled 2014-07-02 (×11): qty 1

## 2014-07-02 MED ORDER — POTASSIUM CHLORIDE 10 MEQ/100ML IV SOLN
10.0000 meq | INTRAVENOUS | Status: AC
  Administered 2014-07-02 (×4): 10 meq via INTRAVENOUS
  Filled 2014-07-02 (×4): qty 100

## 2014-07-02 MED ORDER — CARVEDILOL 6.25 MG PO TABS
6.2500 mg | ORAL_TABLET | Freq: Two times a day (BID) | ORAL | Status: DC
  Administered 2014-07-02: 6.25 mg via ORAL
  Filled 2014-07-02 (×3): qty 1

## 2014-07-02 MED ORDER — ALBUTEROL SULFATE (2.5 MG/3ML) 0.083% IN NEBU: 2.5000 mg | INHALATION_SOLUTION | RESPIRATORY_TRACT | Status: DC | PRN

## 2014-07-02 MED ORDER — PIPERACILLIN-TAZOBACTAM 3.375 G IVPB 30 MIN
3.3750 g | Freq: Once | INTRAVENOUS | Status: DC
  Filled 2014-07-02: qty 50

## 2014-07-02 MED ORDER — ONDANSETRON HCL 4 MG/2ML IJ SOLN
4.0000 mg | Freq: Four times a day (QID) | INTRAMUSCULAR | Status: DC | PRN
  Administered 2014-07-02: 4 mg via INTRAVENOUS
  Filled 2014-07-02: qty 2

## 2014-07-02 MED ORDER — METOCLOPRAMIDE HCL 5 MG/ML IJ SOLN
10.0000 mg | Freq: Four times a day (QID) | INTRAMUSCULAR | Status: DC
  Administered 2014-07-02 – 2014-07-19 (×69): 10 mg via INTRAVENOUS
  Filled 2014-07-02 (×73): qty 2

## 2014-07-02 MED ORDER — METOPROLOL TARTRATE 1 MG/ML IV SOLN
2.5000 mg | Freq: Three times a day (TID) | INTRAVENOUS | Status: DC
  Administered 2014-07-02 – 2014-07-19 (×50): 2.5 mg via INTRAVENOUS
  Filled 2014-07-02 (×56): qty 5

## 2014-07-02 MED ORDER — ALBUTEROL SULFATE (2.5 MG/3ML) 0.083% IN NEBU
2.5000 mg | INHALATION_SOLUTION | Freq: Four times a day (QID) | RESPIRATORY_TRACT | Status: DC
  Administered 2014-07-02 (×3): 2.5 mg via RESPIRATORY_TRACT
  Filled 2014-07-02 (×4): qty 3

## 2014-07-02 MED ORDER — ALLOPURINOL 100 MG PO TABS
100.0000 mg | ORAL_TABLET | Freq: Two times a day (BID) | ORAL | Status: DC
  Filled 2014-07-02 (×2): qty 1

## 2014-07-02 MED ORDER — FLUCONAZOLE IN SODIUM CHLORIDE 200-0.9 MG/100ML-% IV SOLN
200.0000 mg | INTRAVENOUS | Status: DC
  Administered 2014-07-03 – 2014-07-07 (×5): 200 mg via INTRAVENOUS
  Filled 2014-07-02 (×6): qty 100

## 2014-07-02 MED ORDER — TRAMADOL HCL 50 MG PO TABS: 50.0000 mg | ORAL_TABLET | Freq: Four times a day (QID) | ORAL | Status: DC | PRN

## 2014-07-02 MED ORDER — SODIUM CHLORIDE 0.9 % IV SOLN
INTRAVENOUS | Status: DC
  Administered 2014-07-02 – 2014-07-04 (×5): via INTRAVENOUS

## 2014-07-02 MED ORDER — CILOSTAZOL 50 MG PO TABS
50.0000 mg | ORAL_TABLET | Freq: Two times a day (BID) | ORAL | Status: DC
  Filled 2014-07-02 (×2): qty 1

## 2014-07-02 MED ORDER — SODIUM CHLORIDE 0.9 % IJ SOLN
3.0000 mL | Freq: Two times a day (BID) | INTRAMUSCULAR | Status: DC
  Administered 2014-07-02 – 2014-07-22 (×34): 3 mL via INTRAVENOUS

## 2014-07-02 MED ORDER — VITAMIN B-12 1000 MCG PO TABS
1000.0000 ug | ORAL_TABLET | Freq: Every day | ORAL | Status: DC
  Filled 2014-07-02: qty 1

## 2014-07-02 MED ORDER — SODIUM CHLORIDE 0.9 % IV SOLN
Freq: Once | INTRAVENOUS | Status: AC
  Administered 2014-07-02: 04:00:00 via INTRAVENOUS

## 2014-07-02 MED ORDER — HYDROCODONE-ACETAMINOPHEN 5-325 MG PO TABS: 1.0000 | ORAL_TABLET | ORAL | Status: DC | PRN

## 2014-07-02 NOTE — H&P (Signed)
PCP:  [name], MD    Chief Complaint:  Nausea vomiting abdominal  HPI: [name] is a 80 y.o. male   has a past medical history of Dysrhythmia; Hypertension; Shortness of breath; Claudication of calf muscles (11/05/2012); GERD (gastroesophageal reflux disease); TIA (transient ischemic attack) (2010); Peripheral vascular disease; On home oxygen therapy; Neuromuscular disorder; Macular degeneration, bilateral; Coronary artery disease; COPD (chronic obstructive pulmonary disease); Type II diabetes mellitus; Stroke (05/2013); Arthritis; and Pneumonia.   Presented with  2 day hx of nausea vomiting and diarrhea. His granddaughter have had a similar episode recently. Patient's wife given him some imodium with good relief. He continued to have some nausea and vomiting and was brought to ER. He was treated with Zofran with good results. K was noted down to 3.5 and was treated as well.   Of note patient has history of chronic left-sided pulmonary effusion that has been followed by surgery for recurrent thoracentesis. This was documented as nonmalignant.  Hospitalist was called for admission for dehydration and hypokalemia  Review of Systems:    Pertinent positives include:  abdominal pain, nausea, vomiting, diarrhea,  Constitutional:  No weight loss, night sweats, Fevers, chills, fatigue, weight loss  HEENT:  No headaches, Difficulty swallowing,Tooth/dental problems,Sore throat,  No sneezing, itching, ear ache, nasal congestion, post nasal drip,  Cardio-vascular:  No chest pain, Orthopnea, PND, anasarca, dizziness, palpitations.no Bilateral lower extremity swelling  GI:  No heartburn, indigestion,  change in bowel habits, loss of appetite, melena, blood in stool, hematemesis Resp:  no shortness of breath at rest. No dyspnea on exertion, No excess mucus, no productive cough, No non-productive cough, No coughing up of blood.No change in color of mucus.No wheezing. Skin:  no rash or  lesions. No jaundice GU:  no dysuria, change in color of urine, no urgency or frequency. No straining to urinate.  No flank pain.  Musculoskeletal:  No joint pain or no joint swelling. No decreased range of motion. No back pain.  Psych:  No change in mood or affect. No depression or anxiety. No memory loss.  Neuro: no localizing neurological complaints, no tingling, no weakness, no double vision, no gait abnormality, no slurred speech, no confusion  Otherwise ROS are negative except for above, 10 systems were reviewed  Past Medical History: Past Medical History  Diagnosis Date  . Dysrhythmia     atrial fibrilation  . Hypertension   . Shortness of breath   . Claudication of calf muscles 11/05/2012    right calf  . GERD (gastroesophageal reflux disease)   . TIA (transient ischemic attack) 2010  . Peripheral vascular disease   . On home oxygen therapy     "2L; w/activity" (02/24/2014)  . Neuromuscular disorder     dizziness  . Macular degeneration, bilateral     "has had shots in both eyes"  . Coronary artery disease   . COPD (chronic obstructive pulmonary disease)   . Type II diabetes mellitus   . Stroke 05/2013    "can't use left arm fully since"  . Arthritis     "legs" (02/24/2014)  . Pneumonia     "5 times back to back recently (just finished antibiiotic) " (02/24/2014)   Past Surgical History  Procedure Laterality Date  . Removal of pleural drainage catheter Left 05/03/2014    Procedure: REMOVAL OF PLEURAL DRAINAGE CATHETER;  Surgeon: Ivin Poot, MD;  Location: [place];  Service: Thoracic;  Laterality: Left;  . Foot surgery Left  due to broken foot years ago  . Esophagogastroduodenoscopy N/A 11/02/2013    Procedure: ESOPHAGOGASTRODUODENOSCOPY (EGD);  Surgeon: Cleotis Nipper, MD;  Location: [facility] ENDOSCOPY;  Service: Endoscopy;  Laterality: N/A;  . Vascular surgery      stents to legs  . Cardiac catheterization    . Transurethral resection of prostate    . Femoral  artery stent Left 10/2012  . Chest tube insertion Left 02/24/2014    Procedure: INSERTION PLEURAL DRAINAGE CATHETER;  Surgeon: Ivin Poot, MD;  Location: [facility] OR;  Service: Thoracic;  Laterality: Left;     Medications: Prior to Admission medications   Medication Sig Start Date End Date Taking? Authorizing Provider  albuterol (PROVENTIL) (2.5 MG/3ML) 0.083% nebulizer solution Take 2.5 mg by nebulization 4 (four) times daily.   Yes Historical Provider, MD  allopurinol (ZYLOPRIM) 100 MG tablet Take 100 mg by mouth 2 (two) times daily.  06/28/14  Yes Historical Provider, MD  budesonide (PULMICORT) 0.5 MG/2ML nebulizer solution Take 0.5 mg by nebulization 2 (two) times daily as needed (for wheezing/shortness of breath).    Yes Historical Provider, MD  carvedilol (COREG) 6.25 MG tablet Take 6.25 mg by mouth 2 (two) times daily with a meal.   Yes Historical Provider, MD  cilostazol (PLETAL) 50 MG tablet Take 50 mg by mouth 2 (two) times daily.   Yes Historical Provider, MD  colchicine 0.6 MG tablet Take 1 tablet (0.6 mg total) by mouth 2 (two) times daily. 04/21/14  Yes Garald Balding, NP  loratadine (CLARITIN) 10 MG tablet Take 10 mg by mouth daily.   Yes Historical Provider, MD  loratadine (CLARITIN) 10 MG tablet Take 10 mg by mouth daily.   Yes Historical Provider, MD  magnesium oxide (MAG-OX) 400 MG tablet Take 200 mg by mouth daily.   Yes Historical Provider, MD  Melatonin 3 MG TABS Take 3 mg by mouth at bedtime.   Yes Historical Provider, MD  metFORMIN (GLUCOPHAGE) 1000 MG tablet Take 500 mg by mouth 2 (two) times daily with a meal.   Yes Historical Provider, MD  Multiple Vitamins-Minerals (OCUVITE PRESERVISION PO) Take 1 tablet by mouth daily.   Yes Historical Provider, MD  ranitidine (ZANTAC) 300 MG tablet Take 300 mg by mouth at bedtime.   Yes Historical Provider, MD  simvastatin (ZOCOR) 40 MG tablet Take 40 mg by mouth every evening.   Yes Historical Provider, MD  STARCH-MALTO DEXTRIN  (THICK-IT) POWD 1 scoop by Other route 3 (three) times daily with meals. To make liquids nectar thick   Yes Historical Provider, MD  tiotropium (SPIRIVA) 18 MCG inhalation capsule Place 18 mcg into inhaler and inhale daily.   Yes Historical Provider, MD  traMADol (ULTRAM) 50 MG tablet Take 1 tablet (50 mg total) by mouth every 6 (six) hours as needed for moderate pain. 04/27/14  Yes Erin Barrett, PA-C  vitamin B-12 (CYANOCOBALAMIN) 1000 MCG tablet Take 1,000 mcg by mouth daily.   Yes Historical Provider, MD    Allergies:  No Known Allergies  Social History:  Ambulatory  walker   Lives at home   With family     reports that he quit smoking about 32 years ago. His smoking use included Cigarettes. He has a 105 pack-year smoking history. He has never used smokeless tobacco. He reports that he drinks alcohol. He reports that he does not use illicit drugs.    Family History: family history is negative for Cancer and CAD.    Physical Exam: Patient  Vitals for the past 24 hrs:  BP Temp Temp src Pulse Resp SpO2  07/02/14 0000 170/83 mmHg - - 87 25 94 %  07/01/14 2300 152/69 mmHg - - 57 27 92 %  07/01/14 2245 137/76 mmHg - - 40 18 96 %  07/01/14 2230 134/72 mmHg - - 45 19 93 %  07/01/14 2211 122/77 mmHg 98.3 F (36.8 C) Oral 109 16 92 %    1. General:  in No Acute distress 2. Psychological: Alert and   Oriented 3. Head/ENT:     Dry Mucous Membranes                          Head Non traumatic, neck supple                          Normal  Dentition 4. SKIN:  decreased Skin turgor,  Skin clean Dry and intact no rash 5. Heart: irregular rate and rhythm no Murmur, Rub or gallop 6. Lungs:  no wheezes some crackles left base  7. Abdomen: Soft, . Umbilical tender worse in the left lower quadrant, Non distended 8. Lower extremities: no clubbing, cyanosis, or edema 9. Neurologically Grossly intact, moving all 4 extremities equally 10. MSK: Normal range of motion  body mass index is unknown  because there is no weight on file.   Labs on Admission:   Recent Labs  07/01/14 2219 07/01/14 2233  NA 149* 146  K 3.5* 3.2*  CL 97 97  CO2 32  --   GLUCOSE 150* 157*  BUN 24* 24*  CREATININE 1.06 1.10  CALCIUM 9.4  --     Recent Labs  07/01/14 2219  AST 45*  ALT 30  ALKPHOS 124*  BILITOT 0.7  PROT 7.9  ALBUMIN 3.4*   No results found for this basename: LIPASE, AMYLASE,  in the last 72 hours  Recent Labs  07/01/14 2219 07/01/14 2233  WBC 7.3  --   NEUTROABS 5.1  --   HGB 11.5* 12.2*  HCT 34.0* 36.0*  MCV 95.0  --   PLT 281  --    No results found for this basename: CKTOTAL, CKMB, CKMBINDEX, TROPONINI,  in the last 72 hours No results found for this basename: TSH, T4TOTAL, FREET3, T3FREE, THYROIDAB,  in the last 72 hours No results found for this basename: VITAMINB12, FOLATE, FERRITIN, TIBC, IRON, RETICCTPCT,  in the last 72 hours Lab Results  Component Value Date   HGBA1C 6.1* 12/31/2013    The CrCl is unknown because both a height and weight (above a minimum accepted value) are required for this calculation. ABG    Component Value Date/Time   TCO2 32 07/01/2014 2233     No results found for this basename: DDIMER     Other results:  BNP (last 3 results) No results found for this basename: PROBNP,  in the last 8760 hours  There were no vitals filed for this visit.   Cultures:    Component Value Date/Time   SDES SYNOVIAL FLUID KNEE LEFT 04/21/2014 1930   SDES SYNOVIAL FLUID KNEE LEFT 04/21/2014 1930   [place] Normal 04/21/2014 1930   SPECREQUEST Normal 04/21/2014 1930   CULT  Value: NO GROWTH 3 DAYS Performed at [facility] 04/21/2014 1930   REPTSTATUS 04/21/2014 FINAL 04/21/2014 1930   REPTSTATUS 04/25/2014 FINAL 04/21/2014 1930         Radiological Exams on Admission: Dg  Chest 2 View  07/01/2014   CLINICAL DATA:  Acute onset of nausea, vomiting and diarrhea. Generalized weakness and difficulty swallowing.  EXAM: CHEST  2 VIEW   COMPARISON:  Chest radiograph performed 05/12/2014  FINDINGS: The lungs are well-aerated. Left basilar airspace opacification raises concern for mild pneumonia. A calcified granuloma is again noted at the right lung base. No definite pleural effusion or pneumothorax is seen.  The heart is borderline normal in size. No acute osseous abnormalities are seen.  IMPRESSION: Left basilar airspace opacification raises concern for mild pneumonia.   Electronically Signed   By: Garald Balding M.D.   On: 07/01/2014 23:31    Chart has been reviewed  Assessment/Plan  80 year-old gentleman history COPD, atrial fibrillation, chronic left pleural effusion presents with 2 day history of nausea vomiting diarrhea and abdominal discomfort with recent exposure to her toddler similar presentation was found to be dehydrated hypokalemic.  Present on Admission:  . Dehydration in a second nausea vomiting and diarrhea but may history fluids check orthostatics prior to discharge  . HTN (hypertension) continue home medications  . COPD (chronic obstructive pulmonary disease) albuterol as needed and Spiriva currently stable  . Atrial fibrillation history of chronic atrial fibrillation on aspirin given dehydration somewhat elevated heart rate. Will treat with IV fluids continue Coreg monitor on telemetry given elevated heart rate  . Pleural effusion, left stable chest x-ray worrisome for possible infiltrate clinically no recent history of coughing or unavailable cell count will obtain further imaging  . Hypokalemia - will replace   . Gastroenteritis - nausea vomiting and abdominal pain with some abdominal tenderness as well. Given advanced age will evaluate for diverticulitis and/or intra-abdominal infection. Will obtain CT of abdomen. This also further evaluate lung bases for any evidence of infiltrate   Prophylaxis: SCD, Protonix  CODE STATUS:  FULL CODE  as per patient's request  Other plan as per orders.  I have spent  a total of 55 min on this admission  [name] 07/02/2014, 12:56 AM  [facility]  Pager [phone]   after 2 AM please page floor coverage PA If 7AM-7PM, please contact the day team taking care of the patient  [email or web address]  Password [email or web address]

## 2014-07-02 NOTE — Progress Notes (Signed)
Patient is alertX4. No complaints of pain. Pt has slept majority of the shift able to arouse by voice. Spoke to patient daughter via telephone about patient care. Spoke to patient spouse in person about patient care. All questions answered. Understanding verbalized. Pt in no signs or symptoms of distress. Will continue to monitor.

## 2014-07-02 NOTE — Progress Notes (Signed)
INITIAL NUTRITION ASSESSMENT  Pt meets criteria for SEVERE MALNUTRITION in the context of chronic illness as evidenced by a 20.2% weight loss in one year and severe fat and muscle mass loss.  DOCUMENTATION CODES Per approved criteria  -Severe malnutrition in the context of chronic illness   INTERVENTION: Once diet advances, will provide/order oral supplement for pt.   NUTRITION DIAGNOSIS: Increased nutrient needs related to chronic illness as evidenced by estimated nutrition needs.   Goal: Pt to meet >/= 90% of their estimated nutrition needs   Monitor:  Diet advancement, weight trends, labs, I/O's  Reason for Assessment: MST  80 y.o. male  Admitting Dx: Hypertension  ASSESSMENT: Pt with PMH of Dysrhythmia; HTN; SOB; Claudication of calf muscles; GERD; TIA ; Peripheral vascular disease; On home oxygen therapy; Neuromuscular disorder; Macular degeneration, bilateral; Coronary artery disease; COPD; T2DM; Stroke; Arthritis; and Pneumonia. Presented with 2 day hx of nausea vomiting and diarrhea.  Pt with NGT to suction. Pt reports his nausea and abdominal pains have improved, but reports having a sore feeling in his abdomen. Pt reports he currently has no appetite. Prior to admission pt reports he has been eating well with consumption of 2-3 full meals a day (he is sometimes not a breakfast eater) up until yesterday where he reports not eating anything due to n/v and diarrhea. Noted per Epic weight records, pt has had a 20.2% weight loss in one year. Pt reports he is willing to try oral supplements, however pt is currently NPO.  RD to order once diet advances.   Nutrition Focused Physical Exam:  Subcutaneous Fat:  Orbital Region: N/A Upper Arm Region: Moderate depletion Thoracic and Lumbar Region: Severe depletion  Muscle:  Temple Region: Severe depletion Clavicle Bone Region: Severe depletion Clavicle and Acromion Bone Region: Severe depletion Scapular Bone Region: N/A Dorsal  Hand: Moderate depletion Patellar Region: Severe depletion Anterior Thigh Region: Severe depletion Posterior Calf Region: Severe depletion  Edema: none  Labs:Low GFR. High BUN, AST, and phosphorous (5.9).  Height: Ht Readings from Last 1 Encounters:  07/02/14 5\' 6"  (1.676 m)    Weight: Wt Readings from Last 1 Encounters:  07/02/14 138 lb 3.2 oz (62.687 kg)    Ideal Body Weight: 142 lbs  % Ideal Body Weight: 97%  Wt Readings from Last 10 Encounters:  07/02/14 138 lb 3.2 oz (62.687 kg)  05/12/14 134 lb (60.782 kg)  04/27/14 134 lb (60.782 kg)  04/06/14 134 lb (60.782 kg)  04/04/14 134 lb 9.6 oz (61.054 kg)  03/16/14 136 lb (61.689 kg)  02/25/14 136 lb 14.4 oz (62.097 kg)  02/25/14 136 lb 14.4 oz (62.097 kg)  02/22/14 136 lb 14.4 oz (62.097 kg)  02/16/14 145 lb (65.772 kg)   Usual Body Weight: 145 lbs  % Usual Body Weight: 95%  BMI:  Body mass index is 22.32 kg/(m^2).  Estimated Nutritional Needs: Kcal: 1650-1850  Protein: 80-90 gm  Fluid: 1.6-1.8 L  Skin: Stage I pressure ulcer on sacrum  Diet Order: NPO  EDUCATION NEEDS: -No education needs identified at this time   Intake/Output Summary (Last 24 hours) at 07/02/14 1138 Last data filed at 07/02/14 0940  Gross per 24 hour  Intake      0 ml  Output    160 ml  Net   -160 ml    Last BM: 10/9  Labs:   Recent Labs Lab 07/01/14 2219 07/01/14 2233 07/02/14 0405  NA 149* 146 147  K 3.5* 3.2* 3.8  CL 97  97 96  CO2 32  --  25  BUN 24* 24* 27*  CREATININE 1.06 1.10 1.08  CALCIUM 9.4  --  9.0  MG 1.8  --  1.6  PHOS  --   --  5.9*  GLUCOSE 150* 157* 180*    CBG (last 3)   Recent Labs  07/02/14 0334 07/02/14 0530 07/02/14 1121  GLUCAP 144* 184* 132*    Scheduled Meds: . albuterol  2.5 mg Nebulization QID  . antiseptic oral rinse  7 mL Mouth Rinse BID  . fluconazole (DIFLUCAN) IV  200 mg Intravenous Q24H  . insulin aspart  0-9 Units Subcutaneous 6 times per day  . metoCLOPramide  (REGLAN) injection  10 mg Intravenous 4 times per day  . metoprolol  2.5 mg Intravenous 3 times per day  . piperacillin-tazobactam  3.375 g Intravenous Once  . piperacillin-tazobactam (ZOSYN)  IV  3.375 g Intravenous Q8H  . simvastatin  40 mg Oral QPM  . sodium chloride  3 mL Intravenous Q12H  . tiotropium  18 mcg Inhalation Daily    Continuous Infusions: . sodium chloride 75 mL/hr at 07/02/14 1740    Past Medical History  Diagnosis Date  . Dysrhythmia     atrial fibrilation  . Hypertension   . Shortness of breath   . Claudication of calf muscles 11/05/2012    right calf  . GERD (gastroesophageal reflux disease)   . TIA (transient ischemic attack) 2010  . Peripheral vascular disease   . On home oxygen therapy     "2L; w/activity" (02/24/2014)  . Neuromuscular disorder     dizziness  . Macular degeneration, bilateral     "has had shots in both eyes"  . Coronary artery disease   . COPD (chronic obstructive pulmonary disease)   . Type II diabetes mellitus   . Stroke 05/2013    "can't use left arm fully since"  . Arthritis     "legs" (02/24/2014)  . Pneumonia     "5 times back to back recently (just finished antibiiotic) " (02/24/2014)    Past Surgical History  Procedure Laterality Date  . Removal of pleural drainage catheter Left 05/03/2014    Procedure: REMOVAL OF PLEURAL DRAINAGE CATHETER;  Surgeon: Ivin Poot, MD;  Location: [facility];  Service: Thoracic;  Laterality: Left;  . Foot surgery Left     due to broken foot years ago  . Esophagogastroduodenoscopy N/A 11/02/2013    Procedure: ESOPHAGOGASTRODUODENOSCOPY (EGD);  Surgeon: Cleotis Nipper, MD;  Location: [facility] ENDOSCOPY;  Service: Endoscopy;  Laterality: N/A;  . Vascular surgery      stents to legs  . Cardiac catheterization    . Transurethral resection of prostate    . Femoral artery stent Left 10/2012  . Chest tube insertion Left 02/24/2014    Procedure: INSERTION PLEURAL DRAINAGE CATHETER;  Surgeon: Ivin Poot,  MD;  Location: [facility];  Service: Thoracic;  Laterality: Left;    Kallie Locks, MS, RD, Provisional LDN Pager # [phone] After hours/ weekend pager # [phone]

## 2014-07-02 NOTE — Progress Notes (Signed)
[facility] PROGRESS NOTE  MARQUELLE MUSGRAVE ERX:[id number] DOB: 18-Apr-1928 DOA: 07/01/2014 PCP: [name], MD  Assessment/Plan: Gastric emphysema  Possibly due to ongoing vomiting. placed NG to suction.Marland Kitchen Keep NPO. Empiric zosyn and fluconazole. Hold po meds. IV fluids  Serial abd exam IV fluids and Pain control with prn morphine. schedule Reglan for gastric motility Surgery following -  UGi series vs abd CT in 2-3 days  gastroenteritis Supportive care with fluids, antiemetics and pain control Check stool for c diff , cx and fecal lactoferrin   hypokalemia ' replace with IV Kcl   Afib Increased HR due to dehydration. Place on low dose scheduled IV lopressor. Po Coreg and ASA  on hold  COPD Prn nebs  HTN  off home po meds. Iv lopressor and prn hydralazine  DM Hold metformin. SSI   Code Status: full code Family Communication: none Disposition Plan: continue inpt   Consultants:  CCS  Procedures:  none  Antibiotics:  IV zosyn 10/10--   IV fluconazole 10/10--  HPI/Subjective: Reports generalized abd soreness. No nausea. Has not passed any gas or had BM  Objective: Filed Vitals:   07/02/14 0526  BP: 137/77  Pulse: 89  Temp: 97.8 F (36.6 C)  Resp: 18    Intake/Output Summary (Last 24 hours) at 07/02/14 0955 Last data filed at 07/02/14 0940  Gross per 24 hour  Intake      0 ml  Output    160 ml  Net   -160 ml   Filed Weights   07/02/14 0319  Weight: 62.687 kg (138 lb 3.2 oz)    Exam:   General:  Elderly male in NAD  HEENT: NG in place, dry mucosa  Cardiovascular: S1&S2 irregular, no murmurs  Respiratory: clear b/l  Abdomen: soft, mild distention, tenderness mainly over epigastric and mid abdomen, sluggish BS  Musculoskeletal: warm, no edema   Data Reviewed: Basic Metabolic Panel:  Recent Labs Lab 07/01/14 2219 07/01/14 2233 07/02/14 0405  NA 149* 146 147  K 3.5* 3.2* 3.8  CL 97 97 96  CO2 32  --  25  GLUCOSE 150*  157* 180*  BUN 24* 24* 27*  CREATININE 1.06 1.10 1.08  CALCIUM 9.4  --  9.0  MG 1.8  --  1.6  PHOS  --   --  5.9*   Liver Function Tests:  Recent Labs Lab 07/01/14 2219 07/02/14 0405  AST 45* 41*  ALT 30 27  ALKPHOS 124* 113  BILITOT 0.7 0.5  PROT 7.9 7.0  ALBUMIN 3.4* 3.0*   No results found for this basename: LIPASE, AMYLASE,  in the last 168 hours No results found for this basename: AMMONIA,  in the last 168 hours CBC:  Recent Labs Lab 07/01/14 2219 07/01/14 2233 07/02/14 0405  WBC 7.3  --  7.2  NEUTROABS 5.1  --   --   HGB 11.5* 12.2* 11.6*  HCT 34.0* 36.0* 34.9*  MCV 95.0  --  97.5  PLT 281  --  241   Cardiac Enzymes: No results found for this basename: CKTOTAL, CKMB, CKMBINDEX, TROPONINI,  in the last 168 hours BNP (last 3 results) No results found for this basename: PROBNP,  in the last 8760 hours CBG:  Recent Labs Lab 07/02/14 0334 07/02/14 0530  GLUCAP 144* 184*    No results found for this or any previous visit (from the past 240 hour(s)).   Studies: Dg Chest 2 View  07/01/2014   CLINICAL DATA:  Acute onset  of nausea, vomiting and diarrhea. Generalized weakness and difficulty swallowing.  EXAM: CHEST  2 VIEW  COMPARISON:  Chest radiograph performed 05/12/2014  FINDINGS: The lungs are well-aerated. Left basilar airspace opacification raises concern for mild pneumonia. A calcified granuloma is again noted at the right lung base. No definite pleural effusion or pneumothorax is seen.  The heart is borderline normal in size. No acute osseous abnormalities are seen.  IMPRESSION: Left basilar airspace opacification raises concern for mild pneumonia.   Electronically Signed   By: Garald Balding M.D.   On: 07/01/2014 23:31   Ct Abdomen Pelvis W Contrast  07/02/2014   CLINICAL DATA:  Vomited multiple times since last night. Diarrhea. Initial encounter.  EXAM: CT ABDOMEN AND PELVIS WITH CONTRAST  TECHNIQUE: Multidetector CT imaging of the abdomen and pelvis was  performed using the standard protocol following bolus administration of intravenous contrast.  CONTRAST:  116mL OMNIPAQUE IOHEXOL 300 MG/ML  SOLN  COMPARISON:  None.  FINDINGS: A trace left pleural effusion is noted. Mild bibasilar atelectasis is seen. Diffuse coronary artery calcifications are seen.  The stomach is significantly distended, with decompression of the pylorus and first segment of the duodenum. There appears to be focal soft tissue edema at the pylorus, of uncertain significance. The stomach is filled with fluid and air. There is diffuse gastric emphysema, of uncertain significance. In addition, portal venous gas is noted within the anterior aspect of the liver.  This could reflect infectious emphysematous gastritis, ischemia, or gastric outlet obstruction with markedly elevated pressures. Would correlate with the patient's symptoms. Though calcific atherosclerotic disease is seen at the upper abdomen, there is no obvious ancillary finding to suggest gastric ischemia.  Multiple areas of increased attenuation are noted within the liver, of uncertain significance. A 0.8 cm hypodensity within the left hepatic lobe is nonspecific but may reflect a small cyst. The spleen is unremarkable in appearance. The pancreas and adrenal glands are within normal limits.  Scattered left renal cysts are noted. The right kidney is unremarkable in appearance. There is no evidence of hydronephrosis. No renal or ureteral stones are seen. Minimal nonspecific perinephric stranding is noted bilaterally.  No free fluid is identified. The small bowel is decompressed and grossly unremarkable. The stomach is within normal limits. No acute vascular abnormalities are seen. Diffuse calcification is seen along the abdominal aorta and its branches, including at the origin of the right renal artery. There is mild ectasia of the distal abdominal aorta, without evidence of aneurysmal dilatation.  The appendix is not definitely seen; there  is no evidence for appendicitis. The colon is decompressed and grossly unremarkable in appearance.  The bladder is mildly distended and grossly unremarkable. The prostate is enlarged, measuring 5.2 cm, with mildly heterogeneous enhancement. No inguinal lymphadenopathy is seen.  No acute osseous abnormalities are identified. Chronic bilateral pars defects are seen at L5, without significant anterolisthesis. There is suggestion of osseous fusion at L5-S1.  IMPRESSION: 1. Significantly distended stomach, filled with fluid and air. There is decompression of the pylorus and first segment of the duodenum behind the stomach. Diffuse gastric emphysema noted. Portal venous gas seen at the anterior aspect of the liver. 2. This could reflect infectious emphysematous gastritis, ischemia or gastric outlet obstruction with markedly elevated pressures. No obvious ancillary finding seen to suggest gastric ischemia, though calcific atherosclerotic disease is seen at the upper abdomen. Would correlate with the patient's symptoms, and consider carefully placing an enteric tube to decompress the stomach. 3. Diffuse calcification along  the abdominal aorta and its branches, including at the origin of the right renal artery. Mild ectasia of the distal abdominal aorta. 4. Trace left pleural effusion, with mild bibasilar atelectasis. 5. Diffuse coronary artery calcifications seen. 6. Multiple areas of increased attenuation noted in the liver, of uncertain significance. Question of small hepatic cyst. The appearance of the liver could conceivably reflect compression of the liver due to the stomach, but dynamic liver protocol MRI or CT could be considered for further evaluation on an elective nonemergent basis. 7. Enlarged prostate noted, with mildly heterogeneous enhancement. The appearance is relatively stable from 2006. 8. Chronic bilateral pars defects at L5, without significant anterolisthesis. Suggestion of underlying osseous fusion at  L5-S1.  These results were called by telephone at the time of interpretation on 07/02/2014 at 2:52 am to Junius Creamer PA, who verbally acknowledged these results.   Electronically Signed   By: Garald Balding M.D.   On: 07/02/2014 03:04    Scheduled Meds: . albuterol  2.5 mg Nebulization QID  . antiseptic oral rinse  7 mL Mouth Rinse BID  . fluconazole (DIFLUCAN) IV  200 mg Intravenous Q24H  . insulin aspart  0-9 Units Subcutaneous 6 times per day  . metoCLOPramide (REGLAN) injection  10 mg Intravenous 4 times per day  . metoprolol  2.5 mg Intravenous 3 times per day  . piperacillin-tazobactam  3.375 g Intravenous Once  . piperacillin-tazobactam (ZOSYN)  IV  3.375 g Intravenous Q8H  . simvastatin  40 mg Oral QPM  . sodium chloride  3 mL Intravenous Q12H  . tiotropium  18 mcg Inhalation Daily   Continuous Infusions: . sodium chloride 75 mL/hr at 07/02/14 0812      Time spent: 25 minutes    Javiana Anwar, [place]  [facility] Pager [phone]. If 7PM-7AM, please contact night-coverage at [email or web address], password [facility] 07/02/2014, 9:55 AM  LOS: 1 day

## 2014-07-02 NOTE — Consult Note (Signed)
Reason for Consult:gastric emphysema Referring Physician: Dr. Rogue Bussing of [facility]  [name] is an 80 y.o. male.  HPI:  Pt is an 80 yo M admitted earlier this evening for nausea, vomiting, and diarrhea for 1-2 days.  We are consulted because he was found to have air in his gastric wall on CT.  His granddaughter and wife have also been sick.  He took some imodium and had some relief of the diarrhea, but was still having significant nausea/vomiting.  He denies fever/chills.  He did have an EGD earlier this year for a GI bleed while on xarelto, and he was not found to have any ulcers at that time.  He denies any weight loss recently or change in appetite other than the last 24-48 hours.  His pain is improved since NGT placement.    Past Medical History  Diagnosis Date  . Dysrhythmia     atrial fibrilation  . Hypertension   . Shortness of breath   . Claudication of calf muscles 11/05/2012    right calf  . GERD (gastroesophageal reflux disease)   . TIA (transient ischemic attack) 2010  . Peripheral vascular disease   . On home oxygen therapy     "2L; w/activity" (02/24/2014)  . Neuromuscular disorder     dizziness  . Macular degeneration, bilateral     "has had shots in both eyes"  . Coronary artery disease   . COPD (chronic obstructive pulmonary disease)   . Type II diabetes mellitus   . Stroke 05/2013    "can't use left arm fully since"  . Arthritis     "legs" (02/24/2014)  . Pneumonia     "5 times back to back recently (just finished antibiiotic) " (02/24/2014)    Past Surgical History  Procedure Laterality Date  . Removal of pleural drainage catheter Left 05/03/2014    Procedure: REMOVAL OF PLEURAL DRAINAGE CATHETER;  Surgeon: Ivin Poot, MD;  Location: [facility];  Service: Thoracic;  Laterality: Left;  . Foot surgery Left     due to broken foot years ago  . Esophagogastroduodenoscopy N/A 11/02/2013    Procedure: ESOPHAGOGASTRODUODENOSCOPY (EGD);  Surgeon: Cleotis Nipper, MD;   Location: [facility] ENDOSCOPY;  Service: Endoscopy;  Laterality: N/A;  . Vascular surgery      stents to legs  . Cardiac catheterization    . Transurethral resection of prostate    . Femoral artery stent Left 10/2012  . Chest tube insertion Left 02/24/2014    Procedure: INSERTION PLEURAL DRAINAGE CATHETER;  Surgeon: Ivin Poot, MD;  Location: [facility] OR;  Service: Thoracic;  Laterality: Left;    Family History  Problem Relation Age of Onset  . Cancer Neg Hx   . CAD Neg Hx     Social History:  reports that he quit smoking about 32 years ago. His smoking use included Cigarettes. He has a 105 pack-year smoking history. He has never used smokeless tobacco. He reports that he drinks alcohol. He reports that he does not use illicit drugs.  Allergies: No Known Allergies  Medications:  Prior to Admission:  Prescriptions prior to admission  Medication Sig Dispense Refill  . albuterol (PROVENTIL) (2.5 MG/3ML) 0.083% nebulizer solution Take 2.5 mg by nebulization 4 (four) times daily.      Marland Kitchen allopurinol (ZYLOPRIM) 100 MG tablet Take 100 mg by mouth 2 (two) times daily.       . budesonide (PULMICORT) 0.5 MG/2ML nebulizer solution Take 0.5 mg by nebulization 2 (two)  times daily as needed (for wheezing/shortness of breath).       . carvedilol (COREG) 6.25 MG tablet Take 6.25 mg by mouth 2 (two) times daily with a meal.      . cilostazol (PLETAL) 50 MG tablet Take 50 mg by mouth 2 (two) times daily.      . colchicine 0.6 MG tablet Take 1 tablet (0.6 mg total) by mouth 2 (two) times daily.  10 tablet  0  . loratadine (CLARITIN) 10 MG tablet Take 10 mg by mouth daily.      Marland Kitchen loratadine (CLARITIN) 10 MG tablet Take 10 mg by mouth daily.      . magnesium oxide (MAG-OX) 400 MG tablet Take 200 mg by mouth daily.      . Melatonin 3 MG TABS Take 3 mg by mouth at bedtime.      . metFORMIN (GLUCOPHAGE) 1000 MG tablet Take 500 mg by mouth 2 (two) times daily with a meal.      . Multiple Vitamins-Minerals (OCUVITE  PRESERVISION PO) Take 1 tablet by mouth daily.      . ranitidine (ZANTAC) 300 MG tablet Take 300 mg by mouth at bedtime.      . simvastatin (ZOCOR) 40 MG tablet Take 40 mg by mouth every evening.      Marland Kitchen STARCH-MALTO DEXTRIN (THICK-IT) POWD 1 scoop by Other route 3 (three) times daily with meals. To make liquids nectar thick      . tiotropium (SPIRIVA) 18 MCG inhalation capsule Place 18 mcg into inhaler and inhale daily.      . traMADol (ULTRAM) 50 MG tablet Take 1 tablet (50 mg total) by mouth every 6 (six) hours as needed for moderate pain.  30 tablet  0  . vitamin B-12 (CYANOCOBALAMIN) 1000 MCG tablet Take 1,000 mcg by mouth daily.        Results for orders placed during the hospital encounter of 07/01/14 (from the past 48 hour(s))  CBC WITH DIFFERENTIAL     Status: Abnormal   Collection Time    07/01/14 10:19 PM      Result Value Ref Range   WBC 7.3  4.0 - 10.5 K/uL   RBC 3.58 (*) 4.22 - 5.81 MIL/uL   Hemoglobin 11.5 (*) 13.0 - 17.0 g/dL   HCT 34.0 (*) 39.0 - 52.0 %   MCV 95.0  78.0 - 100.0 fL   MCH 32.1  26.0 - 34.0 pg   MCHC 33.8  30.0 - 36.0 g/dL   RDW 13.4  11.5 - 15.5 %   Platelets 281  150 - 400 K/uL   Neutrophils Relative % 70  43 - 77 %   Neutro Abs 5.1  1.7 - 7.7 K/uL   Lymphocytes Relative 14  12 - 46 %   Lymphs Abs 1.0  0.7 - 4.0 K/uL   Monocytes Relative 16 (*) 3 - 12 %   Monocytes Absolute 1.1 (*) 0.1 - 1.0 K/uL   Eosinophils Relative 0  0 - 5 %   Eosinophils Absolute 0.0  0.0 - 0.7 K/uL   Basophils Relative 0  0 - 1 %   Basophils Absolute 0.0  0.0 - 0.1 K/uL  COMPREHENSIVE METABOLIC PANEL     Status: Abnormal   Collection Time    07/01/14 10:19 PM      Result Value Ref Range   Sodium 149 (*) 137 - 147 mEq/L   Potassium 3.5 (*) 3.7 - 5.3 mEq/L   Chloride 97  96 -  112 mEq/L   CO2 32  19 - 32 mEq/L   Glucose, Bld 150 (*) 70 - 99 mg/dL   BUN 24 (*) 6 - 23 mg/dL   Creatinine, Ser 1.06  0.50 - 1.35 mg/dL   Calcium 9.4  8.4 - 10.5 mg/dL   Total Protein 7.9  6.0  - 8.3 g/dL   Albumin 3.4 (*) 3.5 - 5.2 g/dL   AST 45 (*) 0 - 37 U/L   ALT 30  0 - 53 U/L   Alkaline Phosphatase 124 (*) 39 - 117 U/L   Total Bilirubin 0.7  0.3 - 1.2 mg/dL   GFR calc non Af Amer 62 (*) >90 mL/min   GFR calc Af Amer 72 (*) >90 mL/min   Comment: (NOTE)     The eGFR has been calculated using the CKD EPI equation.     This calculation has not been validated in all clinical situations.     eGFR's persistently <90 mL/min signify possible Chronic Kidney     Disease.   Anion gap 20 (*) 5 - 15  MAGNESIUM     Status: None   Collection Time    07/01/14 10:19 PM      Result Value Ref Range   Magnesium 1.8  1.5 - 2.5 mg/dL  I-STAT CHEM 8, ED     Status: Abnormal   Collection Time    07/01/14 10:33 PM      Result Value Ref Range   Sodium 146  137 - 147 mEq/L   Potassium 3.2 (*) 3.7 - 5.3 mEq/L   Chloride 97  96 - 112 mEq/L   BUN 24 (*) 6 - 23 mg/dL   Creatinine, Ser 1.10  0.50 - 1.35 mg/dL   Glucose, Bld 157 (*) 70 - 99 mg/dL   Calcium, Ion 1.05 (*) 1.13 - 1.30 mmol/L   TCO2 32  0 - 100 mmol/L   Hemoglobin 12.2 (*) 13.0 - 17.0 g/dL   HCT 36.0 (*) 39.0 - 52.0 %  GLUCOSE, CAPILLARY     Status: Abnormal   Collection Time    07/02/14  3:34 AM      Result Value Ref Range   Glucose-Capillary 144 (*) 70 - 99 mg/dL  MAGNESIUM     Status: None   Collection Time    07/02/14  4:05 AM      Result Value Ref Range   Magnesium 1.6  1.5 - 2.5 mg/dL  PHOSPHORUS     Status: Abnormal   Collection Time    07/02/14  4:05 AM      Result Value Ref Range   Phosphorus 5.9 (*) 2.3 - 4.6 mg/dL  TSH     Status: Abnormal   Collection Time    07/02/14  4:05 AM      Result Value Ref Range   TSH 5.250 (*) 0.350 - 4.500 uIU/mL  COMPREHENSIVE METABOLIC PANEL     Status: Abnormal   Collection Time    07/02/14  4:05 AM      Result Value Ref Range   Sodium 147  137 - 147 mEq/L   Potassium 3.8  3.7 - 5.3 mEq/L   Chloride 96  96 - 112 mEq/L   CO2 25  19 - 32 mEq/L   Glucose, Bld 180 (*) 70 -  99 mg/dL   BUN 27 (*) 6 - 23 mg/dL   Creatinine, Ser 1.08  0.50 - 1.35 mg/dL   Calcium 9.0  8.4 - 10.5 mg/dL  Total Protein 7.0  6.0 - 8.3 g/dL   Albumin 3.0 (*) 3.5 - 5.2 g/dL   AST 41 (*) 0 - 37 U/L   ALT 27  0 - 53 U/L   Alkaline Phosphatase 113  39 - 117 U/L   Total Bilirubin 0.5  0.3 - 1.2 mg/dL   GFR calc non Af Amer 61 (*) >90 mL/min   GFR calc Af Amer 71 (*) >90 mL/min   Comment: (NOTE)     The eGFR has been calculated using the CKD EPI equation.     This calculation has not been validated in all clinical situations.     eGFR's persistently <90 mL/min signify possible Chronic Kidney     Disease.   Anion gap 26 (*) 5 - 15   Comment: RESULT CHECKED  CBC     Status: Abnormal   Collection Time    07/02/14  4:05 AM      Result Value Ref Range   WBC 7.2  4.0 - 10.5 K/uL   RBC 3.58 (*) 4.22 - 5.81 MIL/uL   Hemoglobin 11.6 (*) 13.0 - 17.0 g/dL   HCT 34.9 (*) 39.0 - 52.0 %   MCV 97.5  78.0 - 100.0 fL   MCH 32.4  26.0 - 34.0 pg   MCHC 33.2  30.0 - 36.0 g/dL   RDW 13.8  11.5 - 15.5 %   Platelets 241  150 - 400 K/uL  GLUCOSE, CAPILLARY     Status: Abnormal   Collection Time    07/02/14  5:30 AM      Result Value Ref Range   Glucose-Capillary 184 (*) 70 - 99 mg/dL    Dg Chest 2 View  07/01/2014   CLINICAL DATA:  Acute onset of nausea, vomiting and diarrhea. Generalized weakness and difficulty swallowing.  EXAM: CHEST  2 VIEW  COMPARISON:  Chest radiograph performed 05/12/2014  FINDINGS: The lungs are well-aerated. Left basilar airspace opacification raises concern for mild pneumonia. A calcified granuloma is again noted at the right lung base. No definite pleural effusion or pneumothorax is seen.  The heart is borderline normal in size. No acute osseous abnormalities are seen.  IMPRESSION: Left basilar airspace opacification raises concern for mild pneumonia.   Electronically Signed   By: Garald Balding M.D.   On: 07/01/2014 23:31   Ct Abdomen Pelvis W Contrast  07/02/2014    CLINICAL DATA:  Vomited multiple times since last night. Diarrhea. Initial encounter.  EXAM: CT ABDOMEN AND PELVIS WITH CONTRAST  TECHNIQUE: Multidetector CT imaging of the abdomen and pelvis was performed using the standard protocol following bolus administration of intravenous contrast.  CONTRAST:  145m OMNIPAQUE IOHEXOL 300 MG/ML  SOLN  COMPARISON:  None.  FINDINGS: A trace left pleural effusion is noted. Mild bibasilar atelectasis is seen. Diffuse coronary artery calcifications are seen.  The stomach is significantly distended, with decompression of the pylorus and first segment of the duodenum. There appears to be focal soft tissue edema at the pylorus, of uncertain significance. The stomach is filled with fluid and air. There is diffuse gastric emphysema, of uncertain significance. In addition, portal venous gas is noted within the anterior aspect of the liver.  This could reflect infectious emphysematous gastritis, ischemia, or gastric outlet obstruction with markedly elevated pressures. Would correlate with the patient's symptoms. Though calcific atherosclerotic disease is seen at the upper abdomen, there is no obvious ancillary finding to suggest gastric ischemia.  Multiple areas of increased attenuation are noted within  the liver, of uncertain significance. A 0.8 cm hypodensity within the left hepatic lobe is nonspecific but may reflect a small cyst. The spleen is unremarkable in appearance. The pancreas and adrenal glands are within normal limits.  Scattered left renal cysts are noted. The right kidney is unremarkable in appearance. There is no evidence of hydronephrosis. No renal or ureteral stones are seen. Minimal nonspecific perinephric stranding is noted bilaterally.  No free fluid is identified. The small bowel is decompressed and grossly unremarkable. The stomach is within normal limits. No acute vascular abnormalities are seen. Diffuse calcification is seen along the abdominal aorta and its  branches, including at the origin of the right renal artery. There is mild ectasia of the distal abdominal aorta, without evidence of aneurysmal dilatation.  The appendix is not definitely seen; there is no evidence for appendicitis. The colon is decompressed and grossly unremarkable in appearance.  The bladder is mildly distended and grossly unremarkable. The prostate is enlarged, measuring 5.2 cm, with mildly heterogeneous enhancement. No inguinal lymphadenopathy is seen.  No acute osseous abnormalities are identified. Chronic bilateral pars defects are seen at L5, without significant anterolisthesis. There is suggestion of osseous fusion at L5-S1.  IMPRESSION: 1. Significantly distended stomach, filled with fluid and air. There is decompression of the pylorus and first segment of the duodenum behind the stomach. Diffuse gastric emphysema noted. Portal venous gas seen at the anterior aspect of the liver. 2. This could reflect infectious emphysematous gastritis, ischemia or gastric outlet obstruction with markedly elevated pressures. No obvious ancillary finding seen to suggest gastric ischemia, though calcific atherosclerotic disease is seen at the upper abdomen. Would correlate with the patient's symptoms, and consider carefully placing an enteric tube to decompress the stomach. 3. Diffuse calcification along the abdominal aorta and its branches, including at the origin of the right renal artery. Mild ectasia of the distal abdominal aorta. 4. Trace left pleural effusion, with mild bibasilar atelectasis. 5. Diffuse coronary artery calcifications seen. 6. Multiple areas of increased attenuation noted in the liver, of uncertain significance. Question of small hepatic cyst. The appearance of the liver could conceivably reflect compression of the liver due to the stomach, but dynamic liver protocol MRI or CT could be considered for further evaluation on an elective nonemergent basis. 7. Enlarged prostate noted, with  mildly heterogeneous enhancement. The appearance is relatively stable from 2006. 8. Chronic bilateral pars defects at L5, without significant anterolisthesis. Suggestion of underlying osseous fusion at L5-S1.  These results were called by telephone at the time of interpretation on 07/02/2014 at 2:52 am to Junius Creamer PA, who verbally acknowledged these results.   Electronically Signed   By: Garald Balding M.D.   On: 07/02/2014 03:04    Review of Systems  Constitutional: Positive for malaise/fatigue.  HENT: Negative.   Eyes: Negative.   Respiratory: Negative.   Cardiovascular: Negative.   Gastrointestinal: Positive for nausea, vomiting, abdominal pain and diarrhea.  Genitourinary: Negative.   Musculoskeletal: Negative.   Skin: Negative.   Neurological: Negative.   Endo/Heme/Allergies: Negative.   Psychiatric/Behavioral: Negative.    Blood pressure 137/77, pulse 89, temperature 97.8 F (36.6 C), temperature source Oral, resp. rate 18, height _0  (1.676 m), weight 138 lb 3.2 oz (62.687 kg), SpO2 92.00%. Physical Exam  Constitutional: He is oriented to person, place, and time. He appears well-developed. No distress.  Thin  HENT:  Head: Normocephalic and atraumatic.  Eyes: Conjunctivae are normal. Pupils are equal, round, and reactive to light. Right eye  exhibits no discharge. Left eye exhibits no discharge. No scleral icterus.  Neck: Normal range of motion. Neck supple.  Cardiovascular: Normal rate and intact distal pulses.   Irregular   Respiratory: Effort normal. No respiratory distress.  GI: Soft. He exhibits no distension and no mass. There is no tenderness. There is no rebound and no guarding.  Musculoskeletal: He exhibits no edema and no tenderness.  Neurological: He is alert and oriented to person, place, and time. Coordination normal.  Skin: Skin is warm. No rash noted. He is not diaphoretic. No erythema. No pallor.  Psychiatric: He has a normal mood and affect. His behavior  is normal. Judgment and thought content normal.  Slow to respond, but I woke him up.  Also seems slightly hard of hearing.      Assessment/Plan: Gastric emphysema Would treat conservatively as patient has no tenderness or abdominal pain currently and no systemic signs of illness.  Likely a result from numerous episodes of vomiting.   Would keep NPO with broad spectrum antibiotics and diflucan for now.   Supportive IVF. Follow clinically for now.   Consider repeat CT or UGI in a few days.    Unice Vantassel 07/02/2014, 6:29 AM

## 2014-07-02 NOTE — ED Notes (Signed)
Per family at bedside, pt reporting shortness of breat

## 2014-07-02 NOTE — Progress Notes (Signed)
80yo male admitted w/ dehydration 2/2 vomiting and diarrhea, CT shows distended stomach filled w/ fluid and air w/ possible infection, to begin IV ABX.  Will start Zosyn 3.375g IV Q8H for CrCl ~45 ml/min and monitor CBC and Cx.  Wynona Neat, PharmD, BCPS 07/02/2014 4:23 AM

## 2014-07-02 NOTE — ED Notes (Signed)
Pt currently vomiting with RN at bedside. Dark color emesis noted in emesis bag.  Provider made aware of pt's status.  More antiemetics to be ordered.

## 2014-07-03 ENCOUNTER — Inpatient Hospital Stay (HOSPITAL_COMMUNITY): Payer: Medicare Other

## 2014-07-03 DIAGNOSIS — K6389 Other specified diseases of intestine: Secondary | ICD-10-CM | POA: Diagnosis present

## 2014-07-03 DIAGNOSIS — K2960 Other gastritis without bleeding: Secondary | ICD-10-CM

## 2014-07-03 LAB — URINE CULTURE
Colony Count: NO GROWTH
Culture: NO GROWTH

## 2014-07-03 LAB — GLUCOSE, CAPILLARY
GLUCOSE-CAPILLARY: 142 mg/dL — AB (ref 70–99)
GLUCOSE-CAPILLARY: 141 mg/dL — AB (ref 70–99)
GLUCOSE-CAPILLARY: 117 mg/dL — AB (ref 70–99)
Glucose-Capillary: 154 mg/dL — ABNORMAL HIGH (ref 70–99)
Glucose-Capillary: 124 mg/dL — ABNORMAL HIGH (ref 70–99)
Glucose-Capillary: 136 mg/dL — ABNORMAL HIGH (ref 70–99)

## 2014-07-03 MED ORDER — ALBUTEROL SULFATE (2.5 MG/3ML) 0.083% IN NEBU: 2.5000 mg | INHALATION_SOLUTION | RESPIRATORY_TRACT | Status: DC | PRN

## 2014-07-03 NOTE — Progress Notes (Addendum)
[facility] PROGRESS NOTE  AARAN ENBERG WUJ:[id number] DOB: Mar 29, 1928 DOA: 07/01/2014 PCP: [name], MD   brief narrative 80 y.o. male with past medical history of Dysrhythmia; Hypertension; Shortness of breath; Claudication of calf muscles (11/05/2012); GERD (gastroesophageal reflux disease); TIA (transient ischemic attack) (2010); Peripheral vascular disease; On home oxygen therapy; Neuromuscular disorder; Macular degeneration, bilateral; Coronary artery disease; COPD on home o2; Type II diabetes mellitus; hx if Stroke (05/2013); Arthritis; and Pneumonia. Presented with 2 day hx of nausea vomiting and diarrhea. CT abdomen and pelvis on admission showed significant distended stomach filled with air and fluid with decompression of pylorus and first segment of duodenum suggestive of diffuse gastric emphysema. Patient admitted to medicine and NG placed with high output. Surgery following.   Assessment/Plan: Gastric emphysema  Possibly due to ongoing vomiting. placed NG to suction with high gastric output ( >2L since this am).Marland Kitchen Keep NPO. Empiric zosyn and fluconazole. Hold po meds. IV fluids and Pain control with prn morphine. schedule Reglan for gastric motility -Serial abd exam Surgery following -  UGi series vs abd CT in 2-3 days  gastroenteritis Supportive care with fluids, antiemetics and pain control No further diarrhea since admission   hypokalemia replaced   Afib on low dose scheduled IV lopressor. Po Coreg and ASA  on hold  COPD Prn nebs  Recurrent left pleural effusion chronic , followed by thoracic surgery requiring thoracentesis. Non malignant effusion.     HTN  off home po meds. Iv lopressor and prn hydralazine  DM Hold metformin. SSI (sensitive )   Code Status: full code Family Communication:updated wife on the phone Disposition Plan: continue inpt   Consultants:  CCS  Procedures:  none  Antibiotics:  IV zosyn 10/10--   IV fluconazole  10/10--  HPI/Subjective: Still having high NG output. Still has abdominal soreness  Objective: Filed Vitals:   07/03/14 0508  BP: 122/65  Pulse: 92  Temp: 98 F (36.7 C)  Resp: 18    Intake/Output Summary (Last 24 hours) at 07/03/14 1320 Last data filed at 07/03/14 1100  Gross per 24 hour  Intake   1600 ml  Output   3300 ml  Net  -1700 ml   Filed Weights   07/02/14 0319 07/03/14 0508  Weight: 62.687 kg (138 lb 3.2 oz) 61.994 kg (136 lb 10.8 oz)    Exam:   General:  Elderly male in NAD  HEENT: NG in place with high output, dry mucosa  Cardiovascular: S1&S2 irregular, no murmurs  Respiratory: clear b/l  Abdomen: soft, mild distention, tenderness mainly over epigastric and mid abdomen, sluggish BS  Musculoskeletal: warm, no edema   Data Reviewed: Basic Metabolic Panel:  Recent Labs Lab 07/01/14 2219 07/01/14 2233 07/02/14 0405  NA 149* 146 147  K 3.5* 3.2* 3.8  CL 97 97 96  CO2 32  --  25  GLUCOSE 150* 157* 180*  BUN 24* 24* 27*  CREATININE 1.06 1.10 1.08  CALCIUM 9.4  --  9.0  MG 1.8  --  1.6  PHOS  --   --  5.9*   Liver Function Tests:  Recent Labs Lab 07/01/14 2219 07/02/14 0405  AST 45* 41*  ALT 30 27  ALKPHOS 124* 113  BILITOT 0.7 0.5  PROT 7.9 7.0  ALBUMIN 3.4* 3.0*   No results found for this basename: LIPASE, AMYLASE,  in the last 168 hours No results found for this basename: AMMONIA,  in the last 168 hours CBC:  Recent Labs Lab  07/01/14 2219 07/01/14 2233 07/02/14 0405  WBC 7.3  --  7.2  NEUTROABS 5.1  --   --   HGB 11.5* 12.2* 11.6*  HCT 34.0* 36.0* 34.9*  MCV 95.0  --  97.5  PLT 281  --  241   Cardiac Enzymes: No results found for this basename: CKTOTAL, CKMB, CKMBINDEX, TROPONINI,  in the last 168 hours BNP (last 3 results) No results found for this basename: PROBNP,  in the last 8760 hours CBG:  Recent Labs Lab 07/02/14 2050 07/03/14 0102 07/03/14 0437 07/03/14 0936 07/03/14 1151  GLUCAP 147* 142* 154*  141* 124*    No results found for this or any previous visit (from the past 240 hour(s)).   Studies: Dg Chest 2 View  07/01/2014   CLINICAL DATA:  Acute onset of nausea, vomiting and diarrhea. Generalized weakness and difficulty swallowing.  EXAM: CHEST  2 VIEW  COMPARISON:  Chest radiograph performed 05/12/2014  FINDINGS: The lungs are well-aerated. Left basilar airspace opacification raises concern for mild pneumonia. A calcified granuloma is again noted at the right lung base. No definite pleural effusion or pneumothorax is seen.  The heart is borderline normal in size. No acute osseous abnormalities are seen.  IMPRESSION: Left basilar airspace opacification raises concern for mild pneumonia.   Electronically Signed   By: Garald Balding M.D.   On: 07/01/2014 23:31   Ct Abdomen Pelvis W Contrast  07/02/2014   CLINICAL DATA:  Vomited multiple times since last night. Diarrhea. Initial encounter.  EXAM: CT ABDOMEN AND PELVIS WITH CONTRAST  TECHNIQUE: Multidetector CT imaging of the abdomen and pelvis was performed using the standard protocol following bolus administration of intravenous contrast.  CONTRAST:  143mL OMNIPAQUE IOHEXOL 300 MG/ML  SOLN  COMPARISON:  None.  FINDINGS: A trace left pleural effusion is noted. Mild bibasilar atelectasis is seen. Diffuse coronary artery calcifications are seen.  The stomach is significantly distended, with decompression of the pylorus and first segment of the duodenum. There appears to be focal soft tissue edema at the pylorus, of uncertain significance. The stomach is filled with fluid and air. There is diffuse gastric emphysema, of uncertain significance. In addition, portal venous gas is noted within the anterior aspect of the liver.  This could reflect infectious emphysematous gastritis, ischemia, or gastric outlet obstruction with markedly elevated pressures. Would correlate with the patient's symptoms. Though calcific atherosclerotic disease is seen at the  upper abdomen, there is no obvious ancillary finding to suggest gastric ischemia.  Multiple areas of increased attenuation are noted within the liver, of uncertain significance. A 0.8 cm hypodensity within the left hepatic lobe is nonspecific but may reflect a small cyst. The spleen is unremarkable in appearance. The pancreas and adrenal glands are within normal limits.  Scattered left renal cysts are noted. The right kidney is unremarkable in appearance. There is no evidence of hydronephrosis. No renal or ureteral stones are seen. Minimal nonspecific perinephric stranding is noted bilaterally.  No free fluid is identified. The small bowel is decompressed and grossly unremarkable. The stomach is within normal limits. No acute vascular abnormalities are seen. Diffuse calcification is seen along the abdominal aorta and its branches, including at the origin of the right renal artery. There is mild ectasia of the distal abdominal aorta, without evidence of aneurysmal dilatation.  The appendix is not definitely seen; there is no evidence for appendicitis. The colon is decompressed and grossly unremarkable in appearance.  The bladder is mildly distended and grossly unremarkable. The  prostate is enlarged, measuring 5.2 cm, with mildly heterogeneous enhancement. No inguinal lymphadenopathy is seen.  No acute osseous abnormalities are identified. Chronic bilateral pars defects are seen at L5, without significant anterolisthesis. There is suggestion of osseous fusion at L5-S1.  IMPRESSION: 1. Significantly distended stomach, filled with fluid and air. There is decompression of the pylorus and first segment of the duodenum behind the stomach. Diffuse gastric emphysema noted. Portal venous gas seen at the anterior aspect of the liver. 2. This could reflect infectious emphysematous gastritis, ischemia or gastric outlet obstruction with markedly elevated pressures. No obvious ancillary finding seen to suggest gastric ischemia,  though calcific atherosclerotic disease is seen at the upper abdomen. Would correlate with the patient's symptoms, and consider carefully placing an enteric tube to decompress the stomach. 3. Diffuse calcification along the abdominal aorta and its branches, including at the origin of the right renal artery. Mild ectasia of the distal abdominal aorta. 4. Trace left pleural effusion, with mild bibasilar atelectasis. 5. Diffuse coronary artery calcifications seen. 6. Multiple areas of increased attenuation noted in the liver, of uncertain significance. Question of small hepatic cyst. The appearance of the liver could conceivably reflect compression of the liver due to the stomach, but dynamic liver protocol MRI or CT could be considered for further evaluation on an elective nonemergent basis. 7. Enlarged prostate noted, with mildly heterogeneous enhancement. The appearance is relatively stable from 2006. 8. Chronic bilateral pars defects at L5, without significant anterolisthesis. Suggestion of underlying osseous fusion at L5-S1.  These results were called by telephone at the time of interpretation on 07/02/2014 at 2:52 am to Junius Creamer PA, who verbally acknowledged these results.   Electronically Signed   By: Garald Balding M.D.   On: 07/02/2014 03:04   Dg Abd Portable 1v  07/03/2014   CLINICAL DATA:  Nasogastric tube placement.  EXAM: PORTABLE ABDOMEN - 1 VIEW  COMPARISON:  CT on 07/02/2014  FINDINGS: Nasogastric tube extends into the region of the fundus of the stomach. No evidence of bowel obstruction or significant ileus. Contrast is present in the bladder. There also are faint persistent nephrograms present related to administration of IV contrast yesterday.  IMPRESSION: Nasogastric tube extends into the proximal stomach.   Electronically Signed   By: Aletta Edouard M.D.   On: 07/03/2014 10:39    Scheduled Meds: . antiseptic oral rinse  7 mL Mouth Rinse BID  . fluconazole (DIFLUCAN) IV  200 mg  Intravenous Q24H  . insulin aspart  0-9 Units Subcutaneous 6 times per day  . metoCLOPramide (REGLAN) injection  10 mg Intravenous 4 times per day  . metoprolol  2.5 mg Intravenous 3 times per day  . piperacillin-tazobactam (ZOSYN)  IV  3.375 g Intravenous Q8H  . simvastatin  40 mg Oral QPM  . sodium chloride  3 mL Intravenous Q12H  . tiotropium  18 mcg Inhalation Daily   Continuous Infusions: . sodium chloride 100 mL/hr at 07/03/14 1021      Time spent: 25 minutes    Zac Torti, [place]  [facility] Pager [phone]. If 7PM-7AM, please contact night-coverage at [email or web address], password [facility] 07/03/2014, 1:20 PM  LOS: 2 days

## 2014-07-03 NOTE — ED Provider Notes (Signed)
Medical screening examination/treatment/procedure(s) were performed by non-physician practitioner and as supervising physician I was immediately available for consultation/collaboration.   EKG Interpretation None       Orlie Dakin, MD 07/03/14 0006

## 2014-07-03 NOTE — Progress Notes (Signed)
Patient is alertx4. Ng tube advanced 20 cm in AM. Immediate large output (see intake and output). Md notified. Orders given and completed No complaints of pain. Pt complains of dry mouth. Mouth swabbed x3 during shift. Pt slept majority of shift. Pt resting in bed. Will continue to monitor.

## 2014-07-03 NOTE — Progress Notes (Signed)
Patient's stomach on CT not only had pneumatosis, but looked obstructed.  Still having lots of drak aspirate coming out.  CPM.  Mildly tender.  [name]. Dahlia Bailiff, MD, [place] [phone] [facility]

## 2014-07-03 NOTE — Progress Notes (Signed)
Patient ID: [name], male   DOB: [birth year], 80 y.o.   MRN: [id number]    Subjective: Pt feels ok, if you press on his belly he has a little bit of tenderness. Had 2L of NGT output last night  Objective: Vital signs in last 24 hours: Temp:  [98 F (36.7 C)-98.5 F (36.9 C)] 98 F (36.7 C) (10/11 0508) Pulse Rate:  [56-116] 92 (10/11 0508) Resp:  [18-20] 18 (10/11 0508) BP: (111-154)/(60-90) 122/65 mmHg (10/11 0508) SpO2:  [89 %-96 %] 93 % (10/11 0508) Weight:  [136 lb 10.8 oz (61.994 kg)] 136 lb 10.8 oz (61.994 kg) (10/11 0508) Last BM Date: 07/01/14  Intake/Output from previous day: 10/10 0701 - 10/11 0700 In: 1500 [I.V.:900; NG/GT:500; IV Piggyback:100] Out: 200 [Urine:200] Intake/Output this shift: Total I/O In: -  Out: 1700 [Emesis/NG output:1700]  PE: Abd: soft, minimally tender to palpation in epigastrium, +BS, NGT out too far.  Will have RN advance to next black dot.  NGT with bilious output   Lab Results:   Recent Labs  07/01/14 2219 07/01/14 2233 07/02/14 0405  WBC 7.3  --  7.2  HGB 11.5* 12.2* 11.6*  HCT 34.0* 36.0* 34.9*  PLT 281  --  241   BMET  Recent Labs  07/01/14 2219 07/01/14 2233 07/02/14 0405  NA 149* 146 147  K 3.5* 3.2* 3.8  CL 97 97 96  CO2 32  --  25  GLUCOSE 150* 157* 180*  BUN 24* 24* 27*  CREATININE 1.06 1.10 1.08  CALCIUM 9.4  --  9.0   PT/INR No results found for this basename: LABPROT, INR,  in the last 72 hours CMP     Component Value Date/Time   NA 147 07/02/2014 0405   K 3.8 07/02/2014 0405   CL 96 07/02/2014 0405   CO2 25 07/02/2014 0405   GLUCOSE 180* 07/02/2014 0405   BUN 27* 07/02/2014 0405   CREATININE 1.08 07/02/2014 0405   CALCIUM 9.0 07/02/2014 0405   PROT 7.0 07/02/2014 0405   ALBUMIN 3.0* 07/02/2014 0405   AST 41* 07/02/2014 0405   ALT 27 07/02/2014 0405   ALKPHOS 113 07/02/2014 0405   BILITOT 0.5 07/02/2014 0405   GFRNONAA 61* 07/02/2014 0405   GFRAA 71* 07/02/2014 0405   Lipase      Component Value Date/Time   LIPASE 23 12/24/2013 1143       Studies/Results: Dg Chest 2 View  07/01/2014   CLINICAL DATA:  Acute onset of nausea, vomiting and diarrhea. Generalized weakness and difficulty swallowing.  EXAM: CHEST  2 VIEW  COMPARISON:  Chest radiograph performed 05/12/2014  FINDINGS: The lungs are well-aerated. Left basilar airspace opacification raises concern for mild pneumonia. A calcified granuloma is again noted at the right lung base. No definite pleural effusion or pneumothorax is seen.  The heart is borderline normal in size. No acute osseous abnormalities are seen.  IMPRESSION: Left basilar airspace opacification raises concern for mild pneumonia.   Electronically Signed   By: Garald Balding M.D.   On: 07/01/2014 23:31   Ct Abdomen Pelvis W Contrast  07/02/2014   CLINICAL DATA:  Vomited multiple times since last night. Diarrhea. Initial encounter.  EXAM: CT ABDOMEN AND PELVIS WITH CONTRAST  TECHNIQUE: Multidetector CT imaging of the abdomen and pelvis was performed using the standard protocol following bolus administration of intravenous contrast.  CONTRAST:  163mL OMNIPAQUE IOHEXOL 300 MG/ML  SOLN  COMPARISON:  None.  FINDINGS: A trace left pleural effusion  is noted. Mild bibasilar atelectasis is seen. Diffuse coronary artery calcifications are seen.  The stomach is significantly distended, with decompression of the pylorus and first segment of the duodenum. There appears to be focal soft tissue edema at the pylorus, of uncertain significance. The stomach is filled with fluid and air. There is diffuse gastric emphysema, of uncertain significance. In addition, portal venous gas is noted within the anterior aspect of the liver.  This could reflect infectious emphysematous gastritis, ischemia, or gastric outlet obstruction with markedly elevated pressures. Would correlate with the patient's symptoms. Though calcific atherosclerotic disease is seen at the upper abdomen, there is no  obvious ancillary finding to suggest gastric ischemia.  Multiple areas of increased attenuation are noted within the liver, of uncertain significance. A 0.8 cm hypodensity within the left hepatic lobe is nonspecific but may reflect a small cyst. The spleen is unremarkable in appearance. The pancreas and adrenal glands are within normal limits.  Scattered left renal cysts are noted. The right kidney is unremarkable in appearance. There is no evidence of hydronephrosis. No renal or ureteral stones are seen. Minimal nonspecific perinephric stranding is noted bilaterally.  No free fluid is identified. The small bowel is decompressed and grossly unremarkable. The stomach is within normal limits. No acute vascular abnormalities are seen. Diffuse calcification is seen along the abdominal aorta and its branches, including at the origin of the right renal artery. There is mild ectasia of the distal abdominal aorta, without evidence of aneurysmal dilatation.  The appendix is not definitely seen; there is no evidence for appendicitis. The colon is decompressed and grossly unremarkable in appearance.  The bladder is mildly distended and grossly unremarkable. The prostate is enlarged, measuring 5.2 cm, with mildly heterogeneous enhancement. No inguinal lymphadenopathy is seen.  No acute osseous abnormalities are identified. Chronic bilateral pars defects are seen at L5, without significant anterolisthesis. There is suggestion of osseous fusion at L5-S1.  IMPRESSION: 1. Significantly distended stomach, filled with fluid and air. There is decompression of the pylorus and first segment of the duodenum behind the stomach. Diffuse gastric emphysema noted. Portal venous gas seen at the anterior aspect of the liver. 2. This could reflect infectious emphysematous gastritis, ischemia or gastric outlet obstruction with markedly elevated pressures. No obvious ancillary finding seen to suggest gastric ischemia, though calcific  atherosclerotic disease is seen at the upper abdomen. Would correlate with the patient's symptoms, and consider carefully placing an enteric tube to decompress the stomach. 3. Diffuse calcification along the abdominal aorta and its branches, including at the origin of the right renal artery. Mild ectasia of the distal abdominal aorta. 4. Trace left pleural effusion, with mild bibasilar atelectasis. 5. Diffuse coronary artery calcifications seen. 6. Multiple areas of increased attenuation noted in the liver, of uncertain significance. Question of small hepatic cyst. The appearance of the liver could conceivably reflect compression of the liver due to the stomach, but dynamic liver protocol MRI or CT could be considered for further evaluation on an elective nonemergent basis. 7. Enlarged prostate noted, with mildly heterogeneous enhancement. The appearance is relatively stable from 2006. 8. Chronic bilateral pars defects at L5, without significant anterolisthesis. Suggestion of underlying osseous fusion at L5-S1.  These results were called by telephone at the time of interpretation on 07/02/2014 at 2:52 am to Junius Creamer PA, who verbally acknowledged these results.   Electronically Signed   By: Garald Balding M.D.   On: 07/02/2014 03:04    Anti-infectives: Anti-infectives   Start  Dose/Rate Route Frequency Ordered Stop   07/02/14 1200  piperacillin-tazobactam (ZOSYN) IVPB 3.375 g     3.375 g 12.5 mL/hr over 240 Minutes Intravenous Every 8 hours 07/02/14 0421     07/02/14 0430  piperacillin-tazobactam (ZOSYN) IVPB 3.375 g  Status:  Discontinued     3.375 g 100 mL/hr over 30 Minutes Intravenous  Once 07/02/14 0421 07/02/14 1625   07/02/14 0400  fluconazole (DIFLUCAN) IVPB 200 mg     200 mg 100 mL/hr over 60 Minutes Intravenous Every 24 hours 07/02/14 0345     07/02/14 0000  cefTRIAXone (ROCEPHIN) 1 g in dextrose 5 % 50 mL IVPB  Status:  Discontinued     1 g 100 mL/hr over 30 Minutes Intravenous  Once  07/01/14 2354 07/01/14 2357       Assessment/Plan  1. N/V/D 2. Emphysema of gastric wall  Plan: 1. Patient's abdominal exam is relatively benign.  Would cont NGT management given high output from this.  No needs for surgical intervention at this time.   LOS: 2 days    Abrish Erny E 07/03/2014, 8:43 AM Pager: [phone]

## 2014-07-04 ENCOUNTER — Inpatient Hospital Stay (HOSPITAL_COMMUNITY): Payer: Medicare Other

## 2014-07-04 DIAGNOSIS — K311 Adult hypertrophic pyloric stenosis: Secondary | ICD-10-CM

## 2014-07-04 DIAGNOSIS — E43 Unspecified severe protein-calorie malnutrition: Secondary | ICD-10-CM

## 2014-07-04 LAB — BASIC METABOLIC PANEL
ANION GAP: 17 — AB (ref 5–15)
BUN: 66 mg/dL — ABNORMAL HIGH (ref 6–23)
CHLORIDE: 98 meq/L (ref 96–112)
CO2: 38 mEq/L — ABNORMAL HIGH (ref 19–32)
Calcium: 8.5 mg/dL (ref 8.4–10.5)
Creatinine, Ser: 2.12 mg/dL — ABNORMAL HIGH (ref 0.50–1.35)
GFR calc Af Amer: 31 mL/min — ABNORMAL LOW (ref >90)
GFR calc non Af Amer: 27 mL/min — ABNORMAL LOW (ref >90)
Glucose, Bld: 139 mg/dL — ABNORMAL HIGH (ref 70–99)
Potassium: 3.3 mEq/L — ABNORMAL LOW (ref 3.7–5.3)
SODIUM: 153 meq/L — AB (ref 137–147)

## 2014-07-04 LAB — GLUCOSE, CAPILLARY
GLUCOSE-CAPILLARY: 140 mg/dL — AB (ref 70–99)
GLUCOSE-CAPILLARY: 145 mg/dL — AB (ref 70–99)
GLUCOSE-CAPILLARY: 121 mg/dL — AB (ref 70–99)
Glucose-Capillary: 138 mg/dL — ABNORMAL HIGH (ref 70–99)
Glucose-Capillary: 139 mg/dL — ABNORMAL HIGH (ref 70–99)
Glucose-Capillary: 140 mg/dL — ABNORMAL HIGH (ref 70–99)
Glucose-Capillary: 159 mg/dL — ABNORMAL HIGH (ref 70–99)

## 2014-07-04 MED ORDER — IOHEXOL 300 MG/ML  SOLN
150.0000 mL | Freq: Once | INTRAMUSCULAR | Status: AC | PRN
  Administered 2014-07-04: 180 mL

## 2014-07-04 NOTE — Progress Notes (Signed)
[facility] PROGRESS NOTE  CAPTAIN BLUCHER ZCH:[id number] DOB: 04/17/1928 DOA: 07/01/2014 PCP: [name], MD   brief narrative 80 y.o. male with past medical history of Dysrhythmia; Hypertension; Shortness of breath; Claudication of calf muscles (11/05/2012); GERD (gastroesophageal reflux disease); TIA (transient ischemic attack) (2010); Peripheral vascular disease; On home oxygen therapy; Neuromuscular disorder; Macular degeneration, bilateral; Coronary artery disease; COPD on home o2; Type II diabetes mellitus; hx if Stroke (05/2013); Arthritis; and Pneumonia. Presented with 2 day hx of nausea vomiting and diarrhea. CT abdomen and pelvis on admission showed significant distended stomach filled with air and fluid with decompression of pylorus and first segment of duodenum suggestive of diffuse gastric emphysema. Patient admitted to medicine and NG placed with high output. Surgery following.   Assessment/Plan: Gastric emphysema  Possibly due to ongoing vomiting. placed NG to suction with significant output ( 4L past 24 hrs, improved overngiht), abdominal pain better but still poor gastric motility.  Keep NPO. Empiric zosyn and fluconazole. Holding po meds. IV fluids and Pain control with prn morphine. schedule Reglan for gastric motility -Serial abd exam Surgery following -  UGi series vs abd CT ?possibly tomorrow. Will d/w surgery  gastroenteritis Supportive care with fluids, antiemetics and pain control No further diarrhea since admission. D/c contact precautions   hypokalemia replaced   Afib on low dose scheduled IV lopressor. Po Coreg and ASA  on hold  COPD Prn nebs  Recurrent left pleural effusion chronic , followed by thoracic surgery requiring thoracentesis. Non malignant effusion.     HTN  off home po meds. Iv lopressor and prn hydralazine  DM Hold metformin. SSI (sensitive )   Code Status: full code Family Communication:wife at bedside Disposition Plan:  continue inpt   Consultants:  CCS  Procedures:  none  Antibiotics:  IV zosyn 10/10--   IV fluconazole 10/10--  HPI/Subjective: Still having high NG output. Still has abdominal soreness  Objective: Filed Vitals:   07/04/14 0500  BP: 111/75  Pulse: 108  Temp: 98.2 F (36.8 C)  Resp: 16    Intake/Output Summary (Last 24 hours) at 07/04/14 0905 Last data filed at 07/04/14 0755  Gross per 24 hour  Intake 3681.66 ml  Output   3575 ml  Net 106.66 ml   Filed Weights   07/02/14 0319 07/03/14 0508 07/04/14 0500  Weight: 62.687 kg (138 lb 3.2 oz) 61.994 kg (136 lb 10.8 oz) 58 kg (127 lb 13.9 oz)    Exam:   General:  Elderly male in NAD  HEENT: NG in place with high output, dry mucosa  Cardiovascular: S1&S2 irregular, no murmurs  Respiratory: clear b/l  Abdomen: soft,  Non dnstended,mild epigastric  Tenderness,  sluggish BS  Musculoskeletal: warm, no edema   Data Reviewed: Basic Metabolic Panel:  Recent Labs Lab 07/01/14 2219 07/01/14 2233 07/02/14 0405  NA 149* 146 147  K 3.5* 3.2* 3.8  CL 97 97 96  CO2 32  --  25  GLUCOSE 150* 157* 180*  BUN 24* 24* 27*  CREATININE 1.06 1.10 1.08  CALCIUM 9.4  --  9.0  MG 1.8  --  1.6  PHOS  --   --  5.9*   Liver Function Tests:  Recent Labs Lab 07/01/14 2219 07/02/14 0405  AST 45* 41*  ALT 30 27  ALKPHOS 124* 113  BILITOT 0.7 0.5  PROT 7.9 7.0  ALBUMIN 3.4* 3.0*   No results found for this basename: LIPASE, AMYLASE,  in the last 168 hours No results  found for this basename: AMMONIA,  in the last 168 hours CBC:  Recent Labs Lab 07/01/14 2219 07/01/14 2233 07/02/14 0405  WBC 7.3  --  7.2  NEUTROABS 5.1  --   --   HGB 11.5* 12.2* 11.6*  HCT 34.0* 36.0* 34.9*  MCV 95.0  --  97.5  PLT 281  --  241   Cardiac Enzymes: No results found for this basename: CKTOTAL, CKMB, CKMBINDEX, TROPONINI,  in the last 168 hours BNP (last 3 results) No results found for this basename: PROBNP,  in the last  8760 hours CBG:  Recent Labs Lab 07/03/14 1151 07/03/14 1629 07/03/14 2035 07/04/14 0023 07/04/14 0510  GLUCAP 124* 136* 117* 159* 140*    Recent Results (from the past 240 hour(s))  CULTURE, BLOOD (ROUTINE X 2)     Status: None   Collection Time    07/02/14  4:24 AM      Result Value Ref Range Status   Specimen Description BLOOD LEFT HAND   Final   Special Requests BOTTLES DRAWN AEROBIC AND ANAEROBIC [facility] EACH   Final   Culture  Setup Time     Final   Value: 07/02/2014 11:05     Performed at [facility]   Culture     Final   Value:        BLOOD CULTURE RECEIVED NO GROWTH TO DATE CULTURE WILL BE HELD FOR 5 DAYS BEFORE ISSUING A FINAL NEGATIVE REPORT     Performed at [facility]   Report Status PENDING   Incomplete  CULTURE, BLOOD (ROUTINE X 2)     Status: None   Collection Time    07/02/14  4:31 AM      Result Value Ref Range Status   Specimen Description BLOOD RIGHT FOREARM   Final   Special Requests BOTTLES DRAWN AEROBIC AND ANAEROBIC 5CC EACH   Final   Culture  Setup Time     Final   Value: 07/02/2014 11:05     Performed at [facility]   Culture     Final   Value:        BLOOD CULTURE RECEIVED NO GROWTH TO DATE CULTURE WILL BE HELD FOR 5 DAYS BEFORE ISSUING A FINAL NEGATIVE REPORT     Performed at [facility]   Report Status PENDING   Incomplete  URINE CULTURE     Status: None   Collection Time    07/02/14 11:41 AM      Result Value Ref Range Status   Specimen Description URINE, RANDOM   Final   Special Requests NONE   Final   Culture  Setup Time     Final   Value: 07/02/2014 19:38     Performed at [place]     Final   Value: NO GROWTH     Performed at [facility]   Culture     Final   Value: NO GROWTH     Performed at [facility]   Report Status 07/03/2014 FINAL   Final     Studies: Dg Abd Portable 1v  07/03/2014   CLINICAL DATA:  Nasogastric tube placement.  EXAM: PORTABLE  ABDOMEN - 1 VIEW  COMPARISON:  CT on 07/02/2014  FINDINGS: Nasogastric tube extends into the region of the fundus of the stomach. No evidence of bowel obstruction or significant ileus. Contrast is present in the bladder. There also are faint persistent nephrograms present related  to administration of IV contrast yesterday.  IMPRESSION: Nasogastric tube extends into the proximal stomach.   Electronically Signed   By: Aletta Edouard M.D.   On: 07/03/2014 10:39    Scheduled Meds: . antiseptic oral rinse  7 mL Mouth Rinse BID  . fluconazole (DIFLUCAN) IV  200 mg Intravenous Q24H  . insulin aspart  0-9 Units Subcutaneous 6 times per day  . metoCLOPramide (REGLAN) injection  10 mg Intravenous 4 times per day  . metoprolol  2.5 mg Intravenous 3 times per day  . piperacillin-tazobactam (ZOSYN)  IV  3.375 g Intravenous Q8H  . simvastatin  40 mg Oral QPM  . sodium chloride  3 mL Intravenous Q12H  . tiotropium  18 mcg Inhalation Daily   Continuous Infusions: . sodium chloride 100 mL/hr at 07/04/14 0007      Time spent: 25 minutes    Danijela Vessey, [place]  [facility] Pager [phone]. If 7PM-7AM, please contact night-coverage at [email or web address], password [facility] 07/04/2014, 9:05 AM  LOS: 3 days

## 2014-07-04 NOTE — Progress Notes (Signed)
General surgery attending:  I have personally interviewed and examined this patient this morning. I reviewed his imaging studies. Agree with the assessment and plan outlined above.  NG is still draining a fair amount of green bilious fluid. Abdominal exam reveals the abdomen to be scaphoid, no distention, subjectively tender lateral aspect of left upper quadrant. No mass or hernia. There is no gastric outlet obstruction. Contrast flowed into the duodenum. No indication for acute surgical intervention. Continue nasogastric suction Abdominal films tomorrow Dr. Cristina Gong  to consider EGD at some point.   [name]. Dalbert Batman, M.D., [facility] General and Minimally invasive Surgery Breast and Colorectal Surgery

## 2014-07-04 NOTE — Clinical Documentation Improvement (Signed)
Possible Clinical Conditions?    Aspiration Pneumonia (POA?) Gram Negative Pneumonia (POA?)  Bacterial pneumonia, specify type if known (POA?) Klebsiella PNA E Coli PNA Pseudomonas PNA Candidiasis PNA Staph/MRSA PNA Strep PNA Pneumococcal PNA H Influenza PNA H Para Influenza PNA  Pneumonia (CAP, HAP) (POA?) Other Condition Cannot Clinically Determine    Risk Factors: Patient with a history of pneumonia per 10/10 progress notes. Patient with ongoing vomiting per 10/10 progress notes.  Diagnostics: 10/09: CXR: Left basilar airspace opacification raises concern for mild pneumonia.  Treatment: 10/10:  IV Zosyn and IV Fluconazole.  Thank You, Theron Arista, Clinical Documentation Specialist:  [phone]  [place] Information Management

## 2014-07-04 NOTE — Clinical Documentation Improvement (Signed)
Possible Clinical Conditions?   Acute Respiratory Failure Acute on Chronic Respiratory Failure Chronic Respiratory Failure Other Condition Cannot Clinically Determine    Risk Factors: Patient with a history of COPD, uses home oxygen @ 2L per 10/10 progress notes.   Thank You, Theron Arista, Clinical Documentation Specialist:  [phone]  [place] Information Management

## 2014-07-04 NOTE — Consult Note (Signed)
Referring Provider: Fanny Skates, MD Primary Care Physician:  [name], MD Primary Gastroenterologist:  Dr. Cristina Gong  Reason for Consultation:  Gastric pneumatosis  HPI: [name] is a 80 y.o. male admitted to the hospital 2 days ago because of nausea, vomiting, and abdominal pain. His CT scan showed gastric distention and pneumatosis.   He was evaluated by general surgery and felt that he could be managed expectantly. The question is now whether he could, or should, undergo endoscopic evaluation to further characterize his gastric mucosa.  The patient indicates that he does have prodromal discomfort, and it does not seem to be significantly change since he was admitted.  His NG tube continues to put out large quantities of fluid.  In my recommendation, a GI series through his NG tube was performed earlier today, which showed no evidence of gastric outlet obstruction or obvious mass effect.  Of note, in February of this year the patient had evidence of a low-grade GI bleed with melena; EGD at that time was unrevealing. On the other hand, endoscopy in February 2010 and showing a small duodenal ulcer while on aspirin, whereas colonoscopy at the same time she showed a solitary diminutive adenomatous polyp.   Past Medical History  Diagnosis Date  . Dysrhythmia     atrial fibrilation  . Hypertension   . Shortness of breath   . Claudication of calf muscles 11/05/2012    right calf  . GERD (gastroesophageal reflux disease)   . TIA (transient ischemic attack) 2010  . Peripheral vascular disease   . On home oxygen therapy     "2L; w/activity" (02/24/2014)  . Neuromuscular disorder     dizziness  . Macular degeneration, bilateral     "has had shots in both eyes"  . Coronary artery disease   . COPD (chronic obstructive pulmonary disease)   . Type II diabetes mellitus   . Stroke 05/2013    "can't use left arm fully since"  . Arthritis     "legs" (02/24/2014)  . Pneumonia     "5  times back to back recently (just finished antibiiotic) " (02/24/2014)    Past Surgical History  Procedure Laterality Date  . Removal of pleural drainage catheter Left 05/03/2014    Procedure: REMOVAL OF PLEURAL DRAINAGE CATHETER;  Surgeon: Ivin Poot, MD;  Location: [place];  Service: Thoracic;  Laterality: Left;  . Foot surgery Left     due to broken foot years ago  . Esophagogastroduodenoscopy N/A 11/02/2013    Procedure: ESOPHAGOGASTRODUODENOSCOPY (EGD);  Surgeon: Cleotis Nipper, MD;  Location[facility] ENDOSCOPY;  Service: Endoscopy;  Laterality: N/A;  . Vascular surgery      stents to legs  . Cardiac catheterization    . Transurethral resection of prostate    . Femoral artery stent Left 10/2012  . Chest tube insertion Left 02/24/2014    Procedure: INSERTION PLEURAL DRAINAGE CATHETER;  Surgeon: Ivin Poot, MD;  Location: [place];  Service: Thoracic;  Laterality: Left;    Prior to Admission medications   Medication Sig Start Date End Date Taking? Authorizing Provider  albuterol (PROVENTIL) (2.5 MG/3ML) 0.083% nebulizer solution Take 2.5 mg by nebulization 4 (four) times daily.   Yes Historical Provider, MD  allopurinol (ZYLOPRIM) 100 MG tablet Take 100 mg by mouth 2 (two) times daily.  06/28/14  Yes Historical Provider, MD  budesonide (PULMICORT) 0.5 MG/2ML nebulizer solution Take 0.5 mg by nebulization 2 (two) times daily as needed (for wheezing/shortness of breath).  Yes Historical Provider, MD  carvedilol (COREG) 6.25 MG tablet Take 6.25 mg by mouth 2 (two) times daily with a meal.   Yes Historical Provider, MD  cilostazol (PLETAL) 50 MG tablet Take 50 mg by mouth 2 (two) times daily.   Yes Historical Provider, MD  colchicine 0.6 MG tablet Take 1 tablet (0.6 mg total) by mouth 2 (two) times daily. 04/21/14  Yes Garald Balding, NP  loratadine (CLARITIN) 10 MG tablet Take 10 mg by mouth daily.   Yes Historical Provider, MD  loratadine (CLARITIN) 10 MG tablet Take 10 mg by mouth daily.    Yes Historical Provider, MD  magnesium oxide (MAG-OX) 400 MG tablet Take 200 mg by mouth daily.   Yes Historical Provider, MD  Melatonin 3 MG TABS Take 3 mg by mouth at bedtime.   Yes Historical Provider, MD  metFORMIN (GLUCOPHAGE) 1000 MG tablet Take 500 mg by mouth 2 (two) times daily with a meal.   Yes Historical Provider, MD  Multiple Vitamins-Minerals (OCUVITE PRESERVISION PO) Take 1 tablet by mouth daily.   Yes Historical Provider, MD  ranitidine (ZANTAC) 300 MG tablet Take 300 mg by mouth at bedtime.   Yes Historical Provider, MD  simvastatin (ZOCOR) 40 MG tablet Take 40 mg by mouth every evening.   Yes Historical Provider, MD  STARCH-MALTO DEXTRIN (THICK-IT) POWD 1 scoop by Other route 3 (three) times daily with meals. To make liquids nectar thick   Yes Historical Provider, MD  tiotropium (SPIRIVA) 18 MCG inhalation capsule Place 18 mcg into inhaler and inhale daily.   Yes Historical Provider, MD  traMADol (ULTRAM) 50 MG tablet Take 1 tablet (50 mg total) by mouth every 6 (six) hours as needed for moderate pain. 04/27/14  Yes Erin Barrett, PA-C  vitamin B-12 (CYANOCOBALAMIN) 1000 MCG tablet Take 1,000 mcg by mouth daily.   Yes Historical Provider, MD    Current Facility-Administered Medications  Medication Dose Route Frequency Provider Last Rate Last Dose  . 0.9 %  sodium chloride infusion   Intravenous Continuous Nishant Dhungel, MD 100 mL/hr at 07/04/14 0007    . acetaminophen (TYLENOL) tablet 650 mg  650 mg Oral Q6H PRN Toy Baker, MD       Or  . acetaminophen (TYLENOL) suppository 650 mg  650 mg Rectal Q6H PRN Toy Baker, MD      . albuterol (PROVENTIL) (2.5 MG/3ML) 0.083% nebulizer solution 2.5 mg  2.5 mg Nebulization Q2H PRN Anastassia Doutova, MD      . albuterol (PROVENTIL) (2.5 MG/3ML) 0.083% nebulizer solution 2.5 mg  2.5 mg Nebulization Q4H PRN Nishant Dhungel, MD      . antiseptic oral rinse (CPC / CETYLPYRIDINIUM CHLORIDE 0.05%) solution 7 mL  7 mL Mouth Rinse  BID Toy Baker, MD   7 mL at 07/04/14 1334  . budesonide (PULMICORT) nebulizer solution 0.5 mg  0.5 mg Nebulization BID PRN Toy Baker, MD      . fluconazole (DIFLUCAN) IVPB 200 mg  200 mg Intravenous Q24H Dianne Dun, NP   200 mg at 07/04/14 0431  . insulin aspart (novoLOG) injection 0-9 Units  0-9 Units Subcutaneous 6 times per day Toy Baker, MD   1 Units at 07/04/14 2026  . metoCLOPramide (REGLAN) injection 10 mg  10 mg Intravenous 4 times per day Toy Baker, MD   10 mg at 07/04/14 1838  . metoprolol (LOPRESSOR) injection 2.5 mg  2.5 mg Intravenous 3 times per day Nishant Dhungel, MD   2.5 mg at  07/04/14 1337  . morphine 2 MG/ML injection 2 mg  2 mg Intravenous Q4H PRN Toy Baker, MD   2 mg at 07/04/14 1454  . ondansetron (ZOFRAN) tablet 4 mg  4 mg Oral Q6H PRN Toy Baker, MD       Or  . ondansetron (ZOFRAN) injection 4 mg  4 mg Intravenous Q6H PRN Toy Baker, MD   4 mg at 07/02/14 1450  . piperacillin-tazobactam (ZOSYN) IVPB 3.375 g  3.375 g Intravenous Q8H Rogue Bussing, RPH   3.375 g at 07/04/14 2025  . simvastatin (ZOCOR) tablet 40 mg  40 mg Oral QPM Toy Baker, MD   40 mg at 07/04/14 1838  . sodium chloride 0.9 % injection 3 mL  3 mL Intravenous Q12H Toy Baker, MD   3 mL at 07/02/14 0404  . tiotropium (SPIRIVA) inhalation capsule 18 mcg  18 mcg Inhalation Daily Toy Baker, MD   18 mcg at 07/04/14 0809    Allergies as of 07/01/2014  . (No Known Allergies)    Family History  Problem Relation Age of Onset  . Cancer Neg Hx   . CAD Neg Hx     History   Social History  . Marital Status: Married    Spouse Name: N/A    Number of Children: N/A  . Years of Education: N/A   Occupational History  . Not on file.   Social History Main Topics  . Smoking status: Former Smoker -- 3.00 packs/day for 35 years    Types: Cigarettes    Quit date: 09/23/1981  . Smokeless tobacco: Never Used   . Alcohol Use: Yes     Comment: QUITS YEARS AGO  . Drug Use: No  . Sexual Activity: No   Other Topics Concern  . Not on file   Social History Narrative   ** Merged History Encounter **        Review of Systems: Please see history of present illness  Physical Exam: Vital signs in last 24 hours: Temp:  [97.7 F (36.5 C)-98.6 F (37 C)] 97.7 F (36.5 C) (10/12 2007) Pulse Rate:  [57-108] 57 (10/12 2007) Resp:  [16-18] 18 (10/12 2007) BP: (111-123)/(68-75) 123/68 mmHg (10/12 2007) SpO2:  [96 %] 96 % (10/12 2007) Weight:  [58 kg (127 lb 13.9 oz)] 58 kg (127 lb 13.9 oz) (10/12 0500) Last BM Date: 07/01/14 General:  Frail, elderly male who has difficulty even talking. Head:  Normocephalic and atraumatic. Eyes:  Sclera clear, no icterus.  Mouth:   No ulcerations or lesions.  Oropharynx pink & somewhat dry. Neck:   No masses or thyromegaly. Lungs:  Clear throughout to auscultation.   No wheezes, crackles, or rhonchi. No evident respiratory distress. Heart:   Regular rate and rhythm; no murmurs, clicks, rubs,  or gallops. Abdomen:  The abdomen is without visible distention. There is some tympany present. It is soft. No evident succussion splash, while on NG suction. With the NG tube crimped, I still thought I heard occasional soft bowel sounds. There is also a soft epigastric bruit. No organomegaly, guarding, mass effect, or tenderness.   Msk:   Symmetrical without gross deformities. Pulses: Radial pulse is rather weak Extremities:   Without clubbing, cyanosis, or edema. Neurologic:  Alert but somewhat mentally distant, although he seems coherent. No obvious focal cranial nerve or motor deficits. Skin:  Intact without significant lesions or rashes. Cervical Nodes:  No significant cervical adenopathy. Psych:   Alert and cooperative. Affect is a bit  dull.  Intake/Output from previous day: 10/11 0701 - 10/12 0700 In: 3731.7 [I.V.:2531.7; NG/GT:900; IV Piggyback:300] Out: 6440  [Urine:275; Emesis/NG output:4000] Intake/Output this shift:    Lab Results:  Recent Labs  07/01/14 2219 07/01/14 2233 07/02/14 0405  WBC 7.3  --  7.2  HGB 11.5* 12.2* 11.6*  HCT 34.0* 36.0* 34.9*  PLT 281  --  241   BMET  Recent Labs  07/01/14 2219 07/01/14 2233 07/02/14 0405 07/04/14 1020  NA 149* 146 147 153*  K 3.5* 3.2* 3.8 3.3*  CL 97 97 96 98  CO2 32  --  25 38*  GLUCOSE 150* 157* 180* 139*  BUN 24* 24* 27* 66*  CREATININE 1.06 1.10 1.08 2.12*  CALCIUM 9.4  --  9.0 8.5   LFT  Recent Labs  07/02/14 0405  PROT 7.0  ALBUMIN 3.0*  AST 41*  ALT 27  ALKPHOS 113  BILITOT 0.5   PT/INR No results found for this basename: LABPROT, INR,  in the last 72 hours  Studies/Results: Dg Abd Portable 1v  07/03/2014   CLINICAL DATA:  Nasogastric tube placement.  EXAM: PORTABLE ABDOMEN - 1 VIEW  COMPARISON:  CT on 07/02/2014  FINDINGS: Nasogastric tube extends into the region of the fundus of the stomach. No evidence of bowel obstruction or significant ileus. Contrast is present in the bladder. There also are faint persistent nephrograms present related to administration of IV contrast yesterday.  IMPRESSION: Nasogastric tube extends into the proximal stomach.   Electronically Signed   By: Aletta Edouard M.D.   On: 07/03/2014 10:39   Dg Duanne Limerick W/water Sol Cm  07/04/2014   CLINICAL DATA:  Gastric emphysema.  Portal venous gas.  EXAM: WATER SOLUBLE UPPER GI SERIES  TECHNIQUE: Single-column upper GI series was performed using water soluble contrast.  CONTRAST:  174mL OMNIPAQUE IOHEXOL 300 MG/ML  SOLN  COMPARISON:  07/02/2014  FLUOROSCOPY TIME:  1 min, 25 seconds  FINDINGS: Because of the gastric emphysema and portal venous gas, I elected to use water-soluble contrast medium rather than barium in case there was perforation or emphysematous gastritis from gastric wall necrosis, or in case urgent gastric surgery was to be required.  I aspirated about 150 cc of bilious material from  the nasogastric tube in order to empty the stomach.  A total of 180 cc of Omnipaque 300 was injected through the nasogastric tube.  Equivocal rugal wall thickening. The gastric emphysema shown on CT is less readily apparent on fluoroscopic images but that does not mean that it has necessarily resolved.  Aside from slightly exaggerated lesser curvature of the stomach, no gastric morphologic abnormality is identified. Contrast flowed freely from the stomach into the proximal duodenum, without evidence of gastric outlet obstruction. No reflux into the esophagus was noted during the exam.  IMPRESSION: 1. The contour of the stomach is unremarkable aside from mild exaggeration of the lesser curvature. No gastric outlet obstruction is demonstrated ; this corresponds to the finding of bilious gastric material which would be unusual in the setting of gastric outlet obstruction. 2. This was not a standard upper GI examination. The patient felt back pain, was delirious, and was not able to turn except very slight amounts, and the exam was performed in the supine in slightly oblique positions. The esophagus and pharynx were not assessed.   Electronically Signed   By: Sherryl Barters M.D.   On: 07/04/2014 14:38    Impression: 1. Upper abdominal pain, associated with vomiting and diarrhea  2. Significant gastric distention, without radiographic evidence of gastric outlet obstruction 3. Air in gastric wall, clinical significance and etiology not clear 4. Large volume gastric output by NG tube, even without gastric outlet obstruction 5. Significant worsening of kidney function in past 24 hours  Plan: 1. Recheck labs tomorrow. If the patient continues to have azotemia, more aggressive fluid supplementation to replace his NG tube losses may be needed 2. It is unclear whether endoscopic evaluation would lead to information which would change our management of this patient. It would, for example, help [place] to know if gastric  mucosal ischemia is present, but it is not clear that that would lead to any change in management other than supportive medical care as we are currently doing. Therefore, I will plan to confer with the surgeons about this decision tomorrow. 3. The gastric distention appears to be on the basis of dysmotility (gastric ileus) as opposed to obstruction or other anatomic issues. The dysmotility could be the result of gastric mucosal ischemia, but the patient does not appear toxic so an infarction seems highly unlikely. I would certainly favor continued decompression with NG suction as is currently being done.    LOS: 3 days   Samera Macy,Almando V  07/04/2014, 9:44 PM

## 2014-07-04 NOTE — Progress Notes (Signed)
Patient ID: [name], male   DOB: Nov 12, 1927, 80 y.o.   MRN: [id number]    Subjective: Pt doing ok, no c/o abdominal pain unless you press on him.  Objective: Vital signs in last 24 hours: Temp:  [97.9 F (36.6 C)-98.6 F (37 C)] 98.2 F (36.8 C) (10/12 0500) Pulse Rate:  [53-110] 108 (10/12 0500) Resp:  [16-20] 16 (10/12 0500) BP: (111-136)/(57-91) 111/75 mmHg (10/12 0500) SpO2:  [96 %-100 %] 96 % (10/12 0500) Weight:  [127 lb 13.9 oz (58 kg)] 127 lb 13.9 oz (58 kg) (10/12 0500) Last BM Date: 07/01/14  Intake/Output from previous day: 10/11 0701 - 10/12 0700 In: 3731.7 [I.V.:2531.7; NG/GT:900; IV Piggyback:300] Out: 4275 [Urine:275; Emesis/NG output:4000] Intake/Output this shift: Total I/O In: 0  Out: 100 [Urine:100]  PE: Abd: soft, mild upper abdominal tenderness, few BS, NGT with thick bilious output  Lab Results:   Recent Labs  07/01/14 2219 07/01/14 2233 07/02/14 0405  WBC 7.3  --  7.2  HGB 11.5* 12.2* 11.6*  HCT 34.0* 36.0* 34.9*  PLT 281  --  241   BMET  Recent Labs  07/01/14 2219 07/01/14 2233 07/02/14 0405  NA 149* 146 147  K 3.5* 3.2* 3.8  CL 97 97 96  CO2 32  --  25  GLUCOSE 150* 157* 180*  BUN 24* 24* 27*  CREATININE 1.06 1.10 1.08  CALCIUM 9.4  --  9.0   PT/INR No results found for this basename: LABPROT, INR,  in the last 72 hours CMP     Component Value Date/Time   NA 147 07/02/2014 0405   K 3.8 07/02/2014 0405   CL 96 07/02/2014 0405   CO2 25 07/02/2014 0405   GLUCOSE 180* 07/02/2014 0405   BUN 27* 07/02/2014 0405   CREATININE 1.08 07/02/2014 0405   CALCIUM 9.0 07/02/2014 0405   PROT 7.0 07/02/2014 0405   ALBUMIN 3.0* 07/02/2014 0405   AST 41* 07/02/2014 0405   ALT 27 07/02/2014 0405   ALKPHOS 113 07/02/2014 0405   BILITOT 0.5 07/02/2014 0405   GFRNONAA 61* 07/02/2014 0405   GFRAA 71* 07/02/2014 0405   Lipase     Component Value Date/Time   LIPASE 23 12/24/2013 1143       Studies/Results: Dg Abd Portable  1v  07/03/2014   CLINICAL DATA:  Nasogastric tube placement.  EXAM: PORTABLE ABDOMEN - 1 VIEW  COMPARISON:  CT on 07/02/2014  FINDINGS: Nasogastric tube extends into the region of the fundus of the stomach. No evidence of bowel obstruction or significant ileus. Contrast is present in the bladder. There also are faint persistent nephrograms present related to administration of IV contrast yesterday.  IMPRESSION: Nasogastric tube extends into the proximal stomach.   Electronically Signed   By: Aletta Edouard M.D.   On: 07/03/2014 10:39    Anti-infectives: Anti-infectives   Start     Dose/Rate Route Frequency Ordered Stop   07/02/14 1200  piperacillin-tazobactam (ZOSYN) IVPB 3.375 g     3.375 g 12.5 mL/hr over 240 Minutes Intravenous Every 8 hours 07/02/14 0421     07/02/14 0430  piperacillin-tazobactam (ZOSYN) IVPB 3.375 g  Status:  Discontinued     3.375 g 100 mL/hr over 30 Minutes Intravenous  Once 07/02/14 0421 07/02/14 1625   07/02/14 0400  fluconazole (DIFLUCAN) IVPB 200 mg     200 mg 100 mL/hr over 60 Minutes Intravenous Every 24 hours 07/02/14 0345     07/02/14 0000  cefTRIAXone (ROCEPHIN) 1 g  in dextrose 5 % 50 mL IVPB  Status:  Discontinued     1 g 100 mL/hr over 30 Minutes Intravenous  Once 07/01/14 2354 07/01/14 2357       Assessment/Plan  1. Emphysema of stomach, ? Obstruction  Plan: 1. Cont NPO and NGT.  CT scan could not rule out GOO given how distended his stomach was.  The patient denies nausea or any issues with his diet prior to this admission.  May need to have GI evaluate the patient.  ? If they can do an EGD to evaluate for obstruction.  ADDENDUM: Spoke to Dr. Cristina Gong.  Will order UGI to start with to define anatomy and then will likely need EGD at some point.  LOS: 3 days    Sintia Mckissic E 07/04/2014, 10:48 AM Pager: [phone]

## 2014-07-04 NOTE — Progress Notes (Signed)
UR completed Carlynn Leduc K. Analie Katzman, RN, BSN, MSHL, CCM  07/04/2014 10:01 AM

## 2014-07-05 ENCOUNTER — Inpatient Hospital Stay (HOSPITAL_COMMUNITY): Payer: Medicare Other

## 2014-07-05 DIAGNOSIS — N179 Acute kidney failure, unspecified: Secondary | ICD-10-CM | POA: Diagnosis not present

## 2014-07-05 DIAGNOSIS — E870 Hyperosmolality and hypernatremia: Secondary | ICD-10-CM | POA: Diagnosis present

## 2014-07-05 LAB — CBC WITH DIFFERENTIAL/PLATELET
BASOS PCT: 0 % (ref 0–1)
Basophils Absolute: 0 10*3/uL (ref 0.0–0.1)
Eosinophils Absolute: 0 10*3/uL (ref 0.0–0.7)
Eosinophils Relative: 0 % (ref 0–5)
HCT: 40.1 % (ref 39.0–52.0)
Hemoglobin: 12.7 g/dL — ABNORMAL LOW (ref 13.0–17.0)
Lymphocytes Relative: 16 % (ref 12–46)
Lymphs Abs: 1.4 10*3/uL (ref 0.7–4.0)
MCH: 32.2 pg (ref 26.0–34.0)
MCHC: 31.7 g/dL (ref 30.0–36.0)
MCV: 101.8 fL — ABNORMAL HIGH (ref 78.0–100.0)
Monocytes Absolute: 1.9 10*3/uL — ABNORMAL HIGH (ref 0.1–1.0)
Monocytes Relative: 21 % — ABNORMAL HIGH (ref 3–12)
NEUTROS ABS: 5.7 10*3/uL (ref 1.7–7.7)
NEUTROS PCT: 63 % (ref 43–77)
Platelets: 206 10*3/uL (ref 150–400)
RBC: 3.94 MIL/uL — AB (ref 4.22–5.81)
RDW: 14.3 % (ref 11.5–15.5)
WBC: 9.1 10*3/uL (ref 4.0–10.5)

## 2014-07-05 LAB — COMPREHENSIVE METABOLIC PANEL
ALK PHOS: 74 U/L (ref 39–117)
ALT: 16 U/L (ref 0–53)
ANION GAP: 20 — AB (ref 5–15)
AST: 23 U/L (ref 0–37)
Albumin: 2.6 g/dL — ABNORMAL LOW (ref 3.5–5.2)
BUN: 69 mg/dL — AB (ref 6–23)
CALCIUM: 8.7 mg/dL (ref 8.4–10.5)
CO2: 36 mEq/L — ABNORMAL HIGH (ref 19–32)
Chloride: 101 mEq/L (ref 96–112)
Creatinine, Ser: 2.09 mg/dL — ABNORMAL HIGH (ref 0.50–1.35)
GFR calc Af Amer: 32 mL/min — ABNORMAL LOW (ref >90)
GFR calc non Af Amer: 27 mL/min — ABNORMAL LOW (ref >90)
GLUCOSE: 120 mg/dL — AB (ref 70–99)
Potassium: 3.4 mEq/L — ABNORMAL LOW (ref 3.7–5.3)
Sodium: 157 mEq/L — ABNORMAL HIGH (ref 137–147)
TOTAL PROTEIN: 6.6 g/dL (ref 6.0–8.3)
Total Bilirubin: 0.6 mg/dL (ref 0.3–1.2)

## 2014-07-05 LAB — GLUCOSE, CAPILLARY
GLUCOSE-CAPILLARY: 134 mg/dL — AB (ref 70–99)
Glucose-Capillary: 113 mg/dL — ABNORMAL HIGH (ref 70–99)
Glucose-Capillary: 121 mg/dL — ABNORMAL HIGH (ref 70–99)
Glucose-Capillary: 87 mg/dL (ref 70–99)
Glucose-Capillary: 94 mg/dL (ref 70–99)
Glucose-Capillary: 98 mg/dL (ref 70–99)

## 2014-07-05 MED ORDER — SODIUM CHLORIDE 0.9 % IV SOLN: INTRAVENOUS | Status: DC

## 2014-07-05 MED ORDER — POTASSIUM CHLORIDE 10 MEQ/100ML IV SOLN
10.0000 meq | INTRAVENOUS | Status: AC
  Administered 2014-07-05 (×3): 10 meq via INTRAVENOUS
  Filled 2014-07-05 (×3): qty 100

## 2014-07-05 MED ORDER — SODIUM CHLORIDE 0.45 % IV SOLN
INTRAVENOUS | Status: DC
  Administered 2014-07-05 – 2014-07-06 (×4): via INTRAVENOUS

## 2014-07-05 MED ORDER — HEPARIN SODIUM (PORCINE) 5000 UNIT/ML IJ SOLN
5000.0000 [IU] | Freq: Three times a day (TID) | INTRAMUSCULAR | Status: DC
  Administered 2014-07-05 – 2014-07-22 (×51): 5000 [IU] via SUBCUTANEOUS
  Filled 2014-07-05 (×53): qty 1

## 2014-07-05 NOTE — Progress Notes (Signed)
Pt seen and examined Denies abd pain but history difficult to obtain  Cachectic, nontoxic Light brown effluent in NG Still with hi NG output; however ctx in colon so not obstructed abd soft, scaphoid, nontender No fever, no wbc  Gastric pneumatosis, unclear etiology Probable gastric ileus due to above Not sure how long gastric ileus may persist Will need to start thinking of nutrition very soon PT/OT  Leighton Ruff. Redmond Pulling, MD, FACS General, Bariatric, & Minimally Invasive Surgery [facility], [place]

## 2014-07-05 NOTE — Progress Notes (Signed)
[facility] PROGRESS NOTE  OLAN KUREK XLK:[id number] DOB: 02/02/1928 DOA: 07/01/2014 PCP: [name], MD   brief narrative 80 y.o. male with past medical history of Dysrhythmia; Hypertension; Shortness of breath; Claudication of calf muscles (11/05/2012); GERD (gastroesophageal reflux disease); TIA (transient ischemic attack) (2010); Peripheral vascular disease; On home oxygen therapy; Neuromuscular disorder; Macular degeneration, bilateral; Coronary artery disease; COPD on home o2; Type II diabetes mellitus; hx if Stroke (05/2013); Arthritis; and Pneumonia. Presented with 2 day hx of nausea vomiting and diarrhea. CT abdomen and pelvis on admission showed significant distended stomach filled with air and fluid with decompression of pylorus and first segment of duodenum suggestive of diffuse gastric emphysema. Patient admitted to medicine and NG placed with high output. Surgery following.   Assessment/Plan: Gastric emphysema  Possibly due to ongoing vomiting prior to admission. -Gastric output has improved from previous day but still quite high (1.5 L) abdominal pain has improved and has good bowel sounds on exam today.  Keep NPO. Empiric zosyn and fluconazole. Holding po meds. IV fluids and Pain control with prn morphine. schedule Reglan for gastric motility -Serial abd exam Surgery and GI following. No signs of obstruction on imaging. -[facility] plan on EGD on 10/14  gastroenteritis Supportive care with fluids, antiemetics and pain control No further diarrhea since admission.   Acute kidney injury Appears to be prerenal with severe dehydration secondary to high gastric output Change IV fluids to half normal saline given hyponatremia and increased it to 125 cc per hour. -Monitor in a.m.   Hypernatremia Secondary to dehydration Change fluids to half normal saline.  Afib on low dose scheduled IV lopressor. Po Coreg and ASA  on hold  COPD Prn nebs  Recurrent left pleural  effusion chronic , followed by thoracic surgery requiring thoracentesis. Non malignant effusion.     HTN  off home po meds. Iv lopressor and prn hydralazine  DM Hold metformin. SSI (sensitive )  Hypokalemia Replenish with KCl   DVT prophylaxis SQ heparin  Diet: N.p.o.    Code Status: full code Family Communication:wife at bedside Disposition Plan: continue inpt monitoring   Consultants:  [facility]  [facility]  Procedures:  none  Antibiotics:  IV zosyn 10/10--   IV fluconazole 10/10--  HPI/Subjective: Still having high NG output , improved from previous days. Pt and wife tearful with his ongoing condition. Were counseled. abd xray with no gastric obstruction.  Objective: Filed Vitals:   07/05/14 0440  BP: 134/64  Pulse: 94  Temp: 97.5 F (36.4 C)  Resp: 18    Intake/Output Summary (Last 24 hours) at 07/05/14 0857 Last data filed at 07/05/14 0700  Gross per 24 hour  Intake   2720 ml  Output   1650 ml  Net   1070 ml   Filed Weights   07/03/14 0508 07/04/14 0500 07/05/14 0440  Weight: 61.994 kg (136 lb 10.8 oz) 58 kg (127 lb 13.9 oz) 56.2 kg (123 lb 14.4 oz)    Exam:   General:  Elderly male in NAD  HEENT: NG in place with high output, dry mucosa  Cardiovascular: S1&S2 irregular, no murmurs  Respiratory: clear b/l  Abdomen: soft,  Non dnstended, no tenderness today, bowel sounds present,    Musculoskeletal: warm, no edema   Data Reviewed: Basic Metabolic Panel:  Recent Labs Lab 07/01/14 2219 07/01/14 2233 07/02/14 0405 07/04/14 1020 07/05/14 0421  NA 149* 146 147 153* 157*  K 3.5* 3.2* 3.8 3.3* 3.4*  CL 97 97 96  98 101  CO2 32  --  25 38* 36*  GLUCOSE 150* 157* 180* 139* 120*  BUN 24* 24* 27* 66* 69*  CREATININE 1.06 1.10 1.08 2.12* 2.09*  CALCIUM 9.4  --  9.0 8.5 8.7  MG 1.8  --  1.6  --   --   PHOS  --   --  5.9*  --   --    Liver Function Tests:  Recent Labs Lab 07/01/14 2219 07/02/14 0405 07/05/14 0421  AST 45* 41*  23  ALT 30 27 16   ALKPHOS 124* 113 74  BILITOT 0.7 0.5 0.6  PROT 7.9 7.0 6.6  ALBUMIN 3.4* 3.0* 2.6*   No results found for this basename: LIPASE, AMYLASE,  in the last 168 hours No results found for this basename: AMMONIA,  in the last 168 hours CBC:  Recent Labs Lab 07/01/14 2219 07/01/14 2233 07/02/14 0405 07/05/14 0421  WBC 7.3  --  7.2 9.1  NEUTROABS 5.1  --   --  5.7  HGB 11.5* 12.2* 11.6* 12.7*  HCT 34.0* 36.0* 34.9* 40.1  MCV 95.0  --  97.5 101.8*  PLT 281  --  241 206   Cardiac Enzymes: No results found for this basename: CKTOTAL, CKMB, CKMBINDEX, TROPONINI,  in the last 168 hours BNP (last 3 results) No results found for this basename: PROBNP,  in the last 8760 hours CBG:  Recent Labs Lab 07/04/14 1551 07/04/14 2008 07/04/14 2313 07/05/14 0354 07/05/14 0712  GLUCAP 145* 139* 121* 134* 113*    Recent Results (from the past 240 hour(s))  CULTURE, BLOOD (ROUTINE X 2)     Status: None   Collection Time    07/02/14  4:24 AM      Result Value Ref Range Status   Specimen Description BLOOD LEFT HAND   Final   Special Requests BOTTLES DRAWN AEROBIC AND ANAEROBIC [place]   Final   Culture  Setup Time     Final   Value: 07/02/2014 11:05     Performed at [facility]   Culture     Final   Value:        BLOOD CULTURE RECEIVED NO GROWTH TO DATE CULTURE WILL BE HELD FOR 5 DAYS BEFORE ISSUING A FINAL NEGATIVE REPORT     Performed at [facility]   Report Status PENDING   Incomplete  CULTURE, BLOOD (ROUTINE X 2)     Status: None   Collection Time    07/02/14  4:31 AM      Result Value Ref Range Status   Specimen Description BLOOD RIGHT FOREARM   Final   Special Requests BOTTLES DRAWN AEROBIC AND ANAEROBIC 5CC EACH   Final   Culture  Setup Time     Final   Value: 07/02/2014 11:05     Performed at [facility]   Culture     Final   Value:        BLOOD CULTURE RECEIVED NO GROWTH TO DATE CULTURE WILL BE HELD FOR 5 DAYS BEFORE ISSUING A  FINAL NEGATIVE REPORT     Performed at [facility]   Report Status PENDING   Incomplete  URINE CULTURE     Status: None   Collection Time    07/02/14 11:41 AM      Result Value Ref Range Status   Specimen Description URINE, RANDOM   Final   Special Requests NONE   Final   Culture  Setup Time  Final   Value: 07/02/2014 19:38     Performed at [place]     Final   Value: NO GROWTH     Performed at [facility]   Culture     Final   Value: NO GROWTH     Performed at [facility]   Report Status 07/03/2014 FINAL   Final     Studies: Dg Abd 1 View  07/05/2014   CLINICAL DATA:  Abdominal pain.  EXAM: ABDOMEN - 1 VIEW  COMPARISON:  July 03, 2014.  FINDINGS: Normal bowel gas pattern is noted. Nasogastric tube tip is in proximal stomach. Moderate amount of residual contrast remains within the cecum. There is decreased amount of contrast in the remaining portions of the colon. Phlebolith is noted in the left side of the pelvis. Contrast is noted within the urinary bladder.  IMPRESSION: No evidence of bowel obstruction or ileus. Moderate amount of residual contrast remains within the cecum. Intravenous contrast is also noted within the urinary bladder.   Electronically Signed   By: Sabino Dick M.D.   On: 07/05/2014 08:30   Dg Abd Portable 1v  07/03/2014   CLINICAL DATA:  Nasogastric tube placement.  EXAM: PORTABLE ABDOMEN - 1 VIEW  COMPARISON:  CT on 07/02/2014  FINDINGS: Nasogastric tube extends into the region of the fundus of the stomach. No evidence of bowel obstruction or significant ileus. Contrast is present in the bladder. There also are faint persistent nephrograms present related to administration of IV contrast yesterday.  IMPRESSION: Nasogastric tube extends into the proximal stomach.   Electronically Signed   By: Aletta Edouard M.D.   On: 07/03/2014 10:39   Dg Duanne Limerick W/water Sol Cm  07/04/2014   CLINICAL DATA:  Gastric  emphysema.  Portal venous gas.  EXAM: WATER SOLUBLE UPPER GI SERIES  TECHNIQUE: Single-column upper GI series was performed using water soluble contrast.  CONTRAST:  178mL OMNIPAQUE IOHEXOL 300 MG/ML  SOLN  COMPARISON:  07/02/2014  FLUOROSCOPY TIME:  1 min, 25 seconds  FINDINGS: Because of the gastric emphysema and portal venous gas, I elected to use water-soluble contrast medium rather than barium in case there was perforation or emphysematous gastritis from gastric wall necrosis, or in case urgent gastric surgery was to be required.  I aspirated about 150 cc of bilious material from the nasogastric tube in order to empty the stomach.  A total of 180 cc of Omnipaque 300 was injected through the nasogastric tube.  Equivocal rugal wall thickening. The gastric emphysema shown on CT is less readily apparent on fluoroscopic images but that does not mean that it has necessarily resolved.  Aside from slightly exaggerated lesser curvature of the stomach, no gastric morphologic abnormality is identified. Contrast flowed freely from the stomach into the proximal duodenum, without evidence of gastric outlet obstruction. No reflux into the esophagus was noted during the exam.  IMPRESSION: 1. The contour of the stomach is unremarkable aside from mild exaggeration of the lesser curvature. No gastric outlet obstruction is demonstrated ; this corresponds to the finding of bilious gastric material which would be unusual in the setting of gastric outlet obstruction. 2. This was not a standard upper GI examination. The patient felt back pain, was delirious, and was not able to turn except very slight amounts, and the exam was performed in the supine in slightly oblique positions. The esophagus and pharynx were not assessed.   Electronically Signed   By: Franchot Mimes  Janeece Fitting M.D.   On: 07/04/2014 14:38    Scheduled Meds: . antiseptic oral rinse  7 mL Mouth Rinse BID  . fluconazole (DIFLUCAN) IV  200 mg Intravenous Q24H  . insulin  aspart  0-9 Units Subcutaneous 6 times per day  . metoCLOPramide (REGLAN) injection  10 mg Intravenous 4 times per day  . metoprolol  2.5 mg Intravenous 3 times per day  . piperacillin-tazobactam (ZOSYN)  IV  3.375 g Intravenous Q8H  . simvastatin  40 mg Oral QPM  . sodium chloride  3 mL Intravenous Q12H  . tiotropium  18 mcg Inhalation Daily   Continuous Infusions: . sodium chloride 125 mL/hr at 07/05/14 0742      Time spent: 25 minutes    Demarie Hyneman, [place]  [facility] Pager [phone]. If 7PM-7AM, please contact night-coverage at [email or web address], password [facility] 07/05/2014, 8:57 AM  LOS: 4 days

## 2014-07-05 NOTE — Care Management Note (Addendum)
Page 2 of 2   07/21/2014     12:33:15 PM CARE MANAGEMENT NOTE 07/21/2014  Patient:  [name], [name]   Account Number:  [id number]  Date Initiated:  07/05/2014  Documentation initiated by:  Mariann Laster  Subjective/Objective Assessment:   Dehydration     Action/Plan:   CM to follow for disposition needs   Anticipated DC Date:  07/08/2014   Anticipated DC Plan:  SKILLED NURSING FACILITY  In-house referral  Clinical Social Worker      DC Forensic scientist  CM consult      [facility] Choice  HOME HEALTH  Resumption Of Svcs/PTA Provider   Choice offered to / List presented to:  NA        HH arranged  HH-1 RN  [place].   Status of service:  Completed, signed off Medicare Important Message given?  YES (If response is "NO", the following Medicare IM given date fields will be blank) Date Medicare IM given:  07/18/2014 Medicare IM given by:  Dan Scearce Date Additional Medicare IM given:  07/13/2014 Additional Medicare IM given by:  Cola Highfill  Discharge Disposition:  [place]  Per UR Regulation:  Reviewed for med. necessity/level of care/duration of stay  If discussed at [place] of Stay Meetings, dates discussed:   07/05/2014  07/07/2014  07/12/2014  07/14/2014  07/19/2014    Comments:  Donella Stade Michel Eskelson RN, BSN, MSHL, CCM  Nurse - Case Manager,  (Unit [place])  [phone]  07/21/2014 Patient and wife now elect SNF (SW/Donna Crowder active with case) Disposition pending progress of tolerance of advancing diet and possible removal of panda v insertion of PEG if unable to take enough po's    Charda Janis RN, BSN, MSHL, CCM  Nurse - Case Manager,  (Unit [facility])  [phone]  07/18/2014 No LTAC options at this time:  Patient/wife/Mable elect to go home with [place]:  RN, PT, HHA ([facility]/marie notified)   07/15/14 [place],  RN, BSN, [employer] [phone] According to [employer], with pt insurance; pt has to be on vent and /or trached to be considered for LTAC placement.  Will update MD.  07/15/14 [place], RN, BSN, [facility] [phone] Hospital liason from [place] states that pt does not have out of network coverage for admission to [facility].  NCM consulted [facility] Ms liason to review for possible admission. Additional IM given to wife.  07/15/14 1030 Camellia J. Clydene Laming, RN, BSN, [employer] [phone] CM spoke with patient and wife at bedside concerning discharge planning. Pt wife concerned about pt possibly going to [facility].  Wife concerned about it being a long term facility- explained that it is simply a term and not that he had to be there forever. Wife more at ease and accepting of [facility].  NCM prepared pt and wife for a possible Monday discharge.  Saundra Gin RN, BSN, MSHL, CCM  Nurse - Case Manager,  (Unit [phone]  07/13/2014 UPDATE from SW:  patient/wife/Mable elect to go home with [facility] Dispo Plan:  RN, PT, HHA ([facility]/marie notified)   Mariann Laster RN, BSN, MSHL, CCM  Nurse - Case Manager,  (Unit [place])  [phone]  07/11/2014 PT RECS:  SNF Dispo Plan:  SNF (SW/Donna Crowder aware - see SW note for further details)   Darian Cansler RN, BSN, MSHL, CCM  Nurse - Tourist information centre manager,  (  Unit [facility])  [phone]  07/05/2014 Gastric pneumatosis, gastric emphysema, gastroenteritis NPO, NG Tube, fluids 125/h Social:  Home with common law wife. Home DME:  Oxygen

## 2014-07-05 NOTE — Progress Notes (Signed)
ANTIBIOTIC CONSULT NOTE - FOLLOW UP  Pharmacy Consult for zosyn Indication: intraabdominal infection  No Known Allergies  Patient Measurements: Height: 5\' 6"  (167.6 cm) Weight: 123 lb 14.4 oz (56.2 kg) IBW/kg (Calculated) : 63.8   Vital Signs: Temp: 97.5 F (36.4 C) (10/13 0440) Temp Source: Oral (10/13 0440) BP: 134/64 mmHg (10/13 0440) Pulse Rate: 94 (10/13 0440) Intake/Output from previous day: 10/12 0701 - 10/13 0700 In: 2720 [I.V.:2470; IV Piggyback:250] Out: 1650 [Urine:100; Emesis/NG output:1550] Intake/Output from this shift:    Labs:  Recent Labs  07/04/14 1020 07/05/14 0421  WBC  --  9.1  HGB  --  12.7*  PLT  --  206  CREATININE 2.12* 2.09*   Estimated Creatinine Clearance: 20.9 ml/min (by C-G formula based on Cr of 2.09). No results found for this basename: VANCOTROUGH, VANCOPEAK, VANCORANDOM, GENTTROUGH, GENTPEAK, GENTRANDOM, TOBRATROUGH, TOBRAPEAK, TOBRARND, AMIKACINPEAK, AMIKACINTROU, AMIKACIN,  in the last 72 hours   Microbiology: Recent Results (from the past 720 hour(s))  CULTURE, BLOOD (ROUTINE X 2)     Status: None   Collection Time    07/02/14  4:24 AM      Result Value Ref Range Status   Specimen Description BLOOD LEFT HAND   Final   Special Requests BOTTLES DRAWN AEROBIC AND ANAEROBIC [place]   Final   Culture  Setup Time     Final   Value: 07/02/2014 11:05     Performed at [facility]   Culture     Final   Value:        BLOOD CULTURE RECEIVED NO GROWTH TO DATE CULTURE WILL BE HELD FOR 5 DAYS BEFORE ISSUING A FINAL NEGATIVE REPORT     Performed at [facility]   Report Status PENDING   Incomplete  CULTURE, BLOOD (ROUTINE X 2)     Status: None   Collection Time    07/02/14  4:31 AM      Result Value Ref Range Status   Specimen Description BLOOD RIGHT FOREARM   Final   Special Requests BOTTLES DRAWN AEROBIC AND ANAEROBIC 5CC EACH   Final   Culture  Setup Time     Final   Value: 07/02/2014 11:05     Performed at  [facility]   Culture     Final   Value:        BLOOD CULTURE RECEIVED NO GROWTH TO DATE CULTURE WILL BE HELD FOR 5 DAYS BEFORE ISSUING A FINAL NEGATIVE REPORT     Performed at [facility]   Report Status PENDING   Incomplete  URINE CULTURE     Status: None   Collection Time    07/02/14 11:41 AM      Result Value Ref Range Status   Specimen Description URINE, RANDOM   Final   Special Requests NONE   Final   Culture  Setup Time     Final   Value: 07/02/2014 19:38     Performed at [place]     Final   Value: NO GROWTH     Performed at [facility]   Culture     Final   Value: NO GROWTH     Performed at [facility]   Report Status 07/03/2014 FINAL   Final    Anti-infectives   Start     Dose/Rate Route Frequency Ordered Stop   07/02/14 1200  piperacillin-tazobactam (ZOSYN) IVPB 3.375 g     3.375  g 12.5 mL/hr over 240 Minutes Intravenous Every 8 hours 07/02/14 0421     07/02/14 0430  piperacillin-tazobactam (ZOSYN) IVPB 3.375 g  Status:  Discontinued     3.375 g 100 mL/hr over 30 Minutes Intravenous  Once 07/02/14 0421 07/02/14 1625   07/02/14 0400  fluconazole (DIFLUCAN) IVPB 200 mg     200 mg 100 mL/hr over 60 Minutes Intravenous Every 24 hours 07/02/14 0345     07/02/14 0000  cefTRIAXone (ROCEPHIN) 1 g in dextrose 5 % 50 mL IVPB  Status:  Discontinued     1 g 100 mL/hr over 30 Minutes Intravenous  Once 07/01/14 2354 07/01/14 2357      Assessment: 80 yo man on zosyn r/o intraabdominal infection.  Surgery and GI have been consulted.  His SrCr is up today to 2.09.  Will need adjustment of zosyn dose if SrCr does not improve.   Goal of Therapy:  Eradication of infection, appropriate dose for renal function  Plan:  Cont zosyn 3.375 gm IV q8 hours F/u renal function  [name] 07/05/2014,9:50 AM

## 2014-07-05 NOTE — Progress Notes (Signed)
Patient ID: [name], male   DOB: 17-Jan-1928, 80 y.o.   MRN: [id number]     [address] [place], [place] [address]    Phone: [phone] FAX: [phone]     Subjective: No n/v.  Significant NGT output.  UGI negative for an obstruction.  AXR today with contrast in the colon.    Objective:  Vital signs:  Filed Vitals:   07/04/14 1432 07/04/14 2007 07/04/14 2145 07/05/14 0440  BP: 114/69 123/68  134/64  Pulse: 95 57 93 94  Temp: 98.6 F (37 C) 97.7 F (36.5 C)  97.5 F (36.4 C)  TempSrc: Oral Oral  Oral  Resp: _0 Height:      Weight:    123 lb 14.4 oz (56.2 kg)  SpO2: 96% 96%  100%    Last BM Date: 07/01/14  Intake/Output   Yesterday:  10/12 0701 - 10/13 0700 In: 2720 [I.V.:2470; IV Piggyback:250] Out: 4585 [Urine:100; Emesis/NG output:1550] This shift: I/O last 3 completed shifts: In: 4251.7 [I.V.:3801.7; IV Piggyback:450] Out: 2825 [Urine:375; Emesis/NG output:2450]    Physical Exam: General: Pt awake/alert/oriented x4 in no  acute distress Abdomen: Soft.  Nondistended. +bs.  Mild ttp left lateral abdomen.  No evidence of peritonitis.  No incarcerated hernias.    Problem List:   Active Problems:   HTN (hypertension)   Diabetes mellitus, type 2   COPD (chronic obstructive pulmonary disease)   Atrial fibrillation   Pleural effusion, left   Hypokalemia   Dehydration   Gastroenteritis   Gastric emphysema   Gastric outflow obstruction    Results:   Labs: Results for orders placed during the hospital encounter of 07/01/14 (from the past 7 hour(s))  GLUCOSE, CAPILLARY     Status: Abnormal   Collection Time    07/03/14 11:51 AM      Result Value Ref Range   Glucose-Capillary 124 (*) 70 - 99 mg/dL  GLUCOSE, CAPILLARY     Status: Abnormal   Collection Time    07/03/14  4:29 PM      Result Value Ref Range   Glucose-Capillary 136 (*) 70 - 99 mg/dL   Comment 1 Notify  RN    GLUCOSE, CAPILLARY     Status: Abnormal   Collection Time    07/03/14  8:35 PM      Result Value Ref Range   Glucose-Capillary 117 (*) 70 - 99 mg/dL  GLUCOSE, CAPILLARY     Status: Abnormal   Collection Time    07/04/14 12:23 AM      Result Value Ref Range   Glucose-Capillary 159 (*) 70 - 99 mg/dL   Comment 1 Notify RN    GLUCOSE, CAPILLARY     Status: Abnormal   Collection Time    07/04/14  5:10 AM      Result Value Ref Range   Glucose-Capillary 140 (*) 70 - 99 mg/dL   Comment 1 Notify RN     Comment 2 Documented in Chart    GLUCOSE, CAPILLARY     Status: Abnormal   Collection Time    07/04/14  7:46 AM      Result Value Ref Range   Glucose-Capillary 140 (*) 70 - 99 mg/dL  BASIC METABOLIC PANEL     Status: Abnormal   Collection Time    07/04/14 10:20 AM      Result Value Ref Range   Sodium  153 (*) 137 - 147 mEq/L   Potassium 3.3 (*) 3.7 - 5.3 mEq/L   Chloride 98  96 - 112 mEq/L   CO2 38 (*) 19 - 32 mEq/L   Glucose, Bld 139 (*) 70 - 99 mg/dL   BUN 66 (*) 6 - 23 mg/dL   Creatinine, Ser 2.12 (*) 0.50 - 1.35 mg/dL   Calcium 8.5  8.4 - 10.5 mg/dL   GFR calc non Af Amer 27 (*) >90 mL/min   GFR calc Af Amer 31 (*) >90 mL/min   Comment: (NOTE)     The eGFR has been calculated using the CKD EPI equation.     This calculation has not been validated in all clinical situations.     eGFR's persistently <90 mL/min signify possible Chronic Kidney     Disease.   Anion gap 17 (*) 5 - 15  GLUCOSE, CAPILLARY     Status: Abnormal   Collection Time    07/04/14 11:14 AM      Result Value Ref Range   Glucose-Capillary 138 (*) 70 - 99 mg/dL  GLUCOSE, CAPILLARY     Status: Abnormal   Collection Time    07/04/14  3:51 PM      Result Value Ref Range   Glucose-Capillary 145 (*) 70 - 99 mg/dL   Comment 1 Notify RN    GLUCOSE, CAPILLARY     Status: Abnormal   Collection Time    07/04/14  8:08 PM      Result Value Ref Range   Glucose-Capillary 139 (*) 70 - 99 mg/dL  GLUCOSE,  CAPILLARY     Status: Abnormal   Collection Time    07/04/14 11:13 PM      Result Value Ref Range   Glucose-Capillary 121 (*) 70 - 99 mg/dL  GLUCOSE, CAPILLARY     Status: Abnormal   Collection Time    07/05/14  3:54 AM      Result Value Ref Range   Glucose-Capillary 134 (*) 70 - 99 mg/dL  COMPREHENSIVE METABOLIC PANEL     Status: Abnormal   Collection Time    07/05/14  4:21 AM      Result Value Ref Range   Sodium 157 (*) 137 - 147 mEq/L   Potassium 3.4 (*) 3.7 - 5.3 mEq/L   Chloride 101  96 - 112 mEq/L   CO2 36 (*) 19 - 32 mEq/L   Glucose, Bld 120 (*) 70 - 99 mg/dL   BUN 69 (*) 6 - 23 mg/dL   Creatinine, Ser 2.09 (*) 0.50 - 1.35 mg/dL   Calcium 8.7  8.4 - 10.5 mg/dL   Total Protein 6.6  6.0 - 8.3 g/dL   Albumin 2.6 (*) 3.5 - 5.2 g/dL   AST 23  0 - 37 U/L   ALT 16  0 - 53 U/L   Alkaline Phosphatase 74  39 - 117 U/L   Total Bilirubin 0.6  0.3 - 1.2 mg/dL   GFR calc non Af Amer 27 (*) >90 mL/min   GFR calc Af Amer 32 (*) >90 mL/min   Comment: (NOTE)     The eGFR has been calculated using the CKD EPI equation.     This calculation has not been validated in all clinical situations.     eGFR's persistently <90 mL/min signify possible Chronic Kidney     Disease.   Anion gap 20 (*) 5 - 15  CBC WITH DIFFERENTIAL     Status: Abnormal   Collection  Time    07/05/14  4:21 AM      Result Value Ref Range   WBC 9.1  4.0 - 10.5 K/uL   RBC 3.94 (*) 4.22 - 5.81 MIL/uL   Hemoglobin 12.7 (*) 13.0 - 17.0 g/dL   HCT 40.1  39.0 - 52.0 %   MCV 101.8 (*) 78.0 - 100.0 fL   MCH 32.2  26.0 - 34.0 pg   MCHC 31.7  30.0 - 36.0 g/dL   RDW 14.3  11.5 - 15.5 %   Platelets 206  150 - 400 K/uL   Neutrophils Relative % 63  43 - 77 %   Neutro Abs 5.7  1.7 - 7.7 K/uL   Lymphocytes Relative 16  12 - 46 %   Lymphs Abs 1.4  0.7 - 4.0 K/uL   Monocytes Relative 21 (*) 3 - 12 %   Monocytes Absolute 1.9 (*) 0.1 - 1.0 K/uL   Eosinophils Relative 0  0 - 5 %   Eosinophils Absolute 0.0  0.0 - 0.7 K/uL    Basophils Relative 0  0 - 1 %   Basophils Absolute 0.0  0.0 - 0.1 K/uL  GLUCOSE, CAPILLARY     Status: Abnormal   Collection Time    07/05/14  7:12 AM      Result Value Ref Range   Glucose-Capillary 113 (*) 70 - 99 mg/dL    Imaging / Studies: Dg Abd 1 View  07/05/2014   CLINICAL DATA:  Abdominal pain.  EXAM: ABDOMEN - 1 VIEW  COMPARISON:  July 03, 2014.  FINDINGS: Normal bowel gas pattern is noted. Nasogastric tube tip is in proximal stomach. Moderate amount of residual contrast remains within the cecum. There is decreased amount of contrast in the remaining portions of the colon. Phlebolith is noted in the left side of the pelvis. Contrast is noted within the urinary bladder.  IMPRESSION: No evidence of bowel obstruction or ileus. Moderate amount of residual contrast remains within the cecum. Intravenous contrast is also noted within the urinary bladder.   Electronically Signed   By: Sabino Dick M.D.   On: 07/05/2014 08:30   Dg Ugi W/water Sol Cm  07/04/2014   CLINICAL DATA:  Gastric emphysema.  Portal venous gas.  EXAM: WATER SOLUBLE UPPER GI SERIES  TECHNIQUE: Single-column upper GI series was performed using water soluble contrast.  CONTRAST:  131m OMNIPAQUE IOHEXOL 300 MG/ML  SOLN  COMPARISON:  07/02/2014  FLUOROSCOPY TIME:  1 min, 25 seconds  FINDINGS: Because of the gastric emphysema and portal venous gas, I elected to use water-soluble contrast medium rather than barium in case there was perforation or emphysematous gastritis from gastric wall necrosis, or in case urgent gastric surgery was to be required.  I aspirated about 150 cc of bilious material from the nasogastric tube in order to empty the stomach.  A total of 180 cc of Omnipaque 300 was injected through the nasogastric tube.  Equivocal rugal wall thickening. The gastric emphysema shown on CT is less readily apparent on fluoroscopic images but that does not mean that it has necessarily resolved.  Aside from slightly exaggerated  lesser curvature of the stomach, no gastric morphologic abnormality is identified. Contrast flowed freely from the stomach into the proximal duodenum, without evidence of gastric outlet obstruction. No reflux into the esophagus was noted during the exam.  IMPRESSION: 1. The contour of the stomach is unremarkable aside from mild exaggeration of the lesser curvature. No gastric outlet obstruction is demonstrated ;  this corresponds to the finding of bilious gastric material which would be unusual in the setting of gastric outlet obstruction. 2. This was not a standard upper GI examination. The patient felt back pain, was delirious, and was not able to turn except very slight amounts, and the exam was performed in the supine in slightly oblique positions. The esophagus and pharynx were not assessed.   Electronically Signed   By: Sherryl Barters M.D.   On: 07/04/2014 14:38    Medications / Allergies:  Scheduled Meds: . antiseptic oral rinse  7 mL Mouth Rinse BID  . fluconazole (DIFLUCAN) IV  200 mg Intravenous Q24H  . insulin aspart  0-9 Units Subcutaneous 6 times per day  . metoCLOPramide (REGLAN) injection  10 mg Intravenous 4 times per day  . metoprolol  2.5 mg Intravenous 3 times per day  . piperacillin-tazobactam (ZOSYN)  IV  3.375 g Intravenous Q8H  . simvastatin  40 mg Oral QPM  . sodium chloride  3 mL Intravenous Q12H  . tiotropium  18 mcg Inhalation Daily   Continuous Infusions: . sodium chloride 125 mL/hr at 07/05/14 0742   PRN Meds:.acetaminophen, acetaminophen, albuterol, albuterol, budesonide, morphine injection, ondansetron (ZOFRAN) IV, ondansetron  Antibiotics: Anti-infectives   Start     Dose/Rate Route Frequency Ordered Stop   07/02/14 1200  piperacillin-tazobactam (ZOSYN) IVPB 3.375 g     3.375 g 12.5 mL/hr over 240 Minutes Intravenous Every 8 hours 07/02/14 0421     07/02/14 0430  piperacillin-tazobactam (ZOSYN) IVPB 3.375 g  Status:  Discontinued     3.375 g 100 mL/hr over  30 Minutes Intravenous  Once 07/02/14 0421 07/02/14 1625   07/02/14 0400  fluconazole (DIFLUCAN) IVPB 200 mg     200 mg 100 mL/hr over 60 Minutes Intravenous Every 24 hours 07/02/14 0345     07/02/14 0000  cefTRIAXone (ROCEPHIN) 1 g in dextrose 5 % 50 mL IVPB  Status:  Discontinued     1 g 100 mL/hr over 30 Minutes Intravenous  Once 07/01/14 2354 07/01/14 2357        Assessment/Plan Abdominal pain Gastric distention Gastric emphysema and portal venous gas on CT -AXR without evidence of obstruction.  Still has significant dark bilious output from NGT(1528m).   -Continue with NGT decompression, IV hydration, monitor electrolytes.   -EGD per GI at some point -non surgical abdomen -will follow along   EErby Pian AWest [facility] Va [facility] CenterSurgery Pager [phone](7A-4:30P) For consults and floor pages call [phone](7A-4:30P)   07/05/2014 10:09 AM

## 2014-07-05 NOTE — Progress Notes (Signed)
Patient's common-law wife is at bedside. He seems in better spirits today. He denies significant abdominal pain, although it is somewhat difficult to get a history from him.  Large-volume NG output continues. The fluid is dark brown in color, but clear, consistent with gastric juice. It does not appear bloody.  As per request of general surgery, I have tentatively scheduled patient for upper endoscopy tomorrow to assess gastric mucosa. The abdomen is nontender to palpation and I believe soft bowel sounds are present. I do believe it is safe for the patient to undergo the procedure. I will probably use the pediatric endoscope and try to get by with minimal insufflation of carbon dioxide during the procedure, so as to minimize the risk of disruption of the gastric mural integrity.  Cleotis Nipper, M.D. [phone]

## 2014-07-05 NOTE — Progress Notes (Signed)
UR completed [name] K. Danelly Hassinger, RN, BSN, MSHL, CCM  07/05/2014 1:40 PM

## 2014-07-05 NOTE — Clinical Documentation Improvement (Signed)
Possible Clinical Conditions?    Acute Renal Failure/Acute Kidney Injury Acute Tubular Necrosis Acute Renal Cortical Necrosis Acute Renal Medullary Necrosis Acute on Chronic Renal Failure Chronic Renal Failure Other Condition Cannot Clinically Determine    Risk Factors: Significant worsening of kidney function in past 24 hours per 10/12 progress notes.  Diagnostics: Bun:   10/13: 69.         10/10: 27 Creat: 10/13: 2.09.      10/10: 1.08. GFR:  10/13: 27.         10/10: 61.   Thank You, Theron Arista, Clinical Documentation Specialist:  [phone]  [facility] [employer]

## 2014-07-06 ENCOUNTER — Encounter (HOSPITAL_COMMUNITY): Payer: Self-pay

## 2014-07-06 ENCOUNTER — Encounter (HOSPITAL_COMMUNITY)
Admission: EM | Disposition: A | Discharge status: Home Health | Payer: Self-pay | Source: Home / Self Care | Attending: Internal Medicine

## 2014-07-06 HISTORY — PX: ESOPHAGOGASTRODUODENOSCOPY (EGD) WITH PROPOFOL: SHX5813

## 2014-07-06 LAB — GLUCOSE, CAPILLARY
GLUCOSE-CAPILLARY: 118 mg/dL — AB (ref 70–99)
GLUCOSE-CAPILLARY: 123 mg/dL — AB (ref 70–99)
Glucose-Capillary: 88 mg/dL (ref 70–99)
Glucose-Capillary: 89 mg/dL (ref 70–99)
Glucose-Capillary: 96 mg/dL (ref 70–99)

## 2014-07-06 LAB — BASIC METABOLIC PANEL
Anion gap: 17 — ABNORMAL HIGH (ref 5–15)
BUN: 63 mg/dL — ABNORMAL HIGH (ref 6–23)
CALCIUM: 9 mg/dL (ref 8.4–10.5)
CHLORIDE: 101 meq/L (ref 96–112)
CO2: 38 mEq/L — ABNORMAL HIGH (ref 19–32)
CREATININE: 1.76 mg/dL — AB (ref 0.50–1.35)
GFR calc Af Amer: 39 mL/min — ABNORMAL LOW (ref >90)
GFR calc non Af Amer: 34 mL/min — ABNORMAL LOW (ref >90)
Glucose, Bld: 88 mg/dL (ref 70–99)
Potassium: 3.6 mEq/L — ABNORMAL LOW (ref 3.7–5.3)
Sodium: 156 mEq/L — ABNORMAL HIGH (ref 137–147)

## 2014-07-06 SURGERY — ESOPHAGOGASTRODUODENOSCOPY (EGD) WITH PROPOFOL
Anesthesia: Moderate Sedation

## 2014-07-06 MED ORDER — CHLORHEXIDINE GLUCONATE 0.12 % MT SOLN
15.0000 mL | Freq: Two times a day (BID) | OROMUCOSAL | Status: DC
  Administered 2014-07-06 – 2014-07-22 (×33): 15 mL via OROMUCOSAL
  Filled 2014-07-06 (×35): qty 15

## 2014-07-06 MED ORDER — FENTANYL CITRATE 0.05 MG/ML IJ SOLN
INTRAMUSCULAR | Status: AC
  Filled 2014-07-06: qty 2

## 2014-07-06 MED ORDER — POTASSIUM CHLORIDE 10 MEQ/100ML IV SOLN
10.0000 meq | INTRAVENOUS | Status: AC
  Administered 2014-07-06 (×2): 10 meq via INTRAVENOUS
  Filled 2014-07-06 (×3): qty 100

## 2014-07-06 MED ORDER — DEXTROSE 5 % IV SOLN
INTRAVENOUS | Status: DC
  Administered 2014-07-06 – 2014-07-07 (×2): via INTRAVENOUS
  Administered 2014-07-07: 125 mL via INTRAVENOUS
  Administered 2014-07-08: 18:00:00 via INTRAVENOUS
  Administered 2014-07-08: 1000 mL via INTRAVENOUS
  Administered 2014-07-08: 22:00:00 via INTRAVENOUS
  Administered 2014-07-09 – 2014-07-10 (×2): 1000 mL via INTRAVENOUS
  Administered 2014-07-11: 14:00:00 via INTRAVENOUS

## 2014-07-06 MED ORDER — MIDAZOLAM HCL 5 MG/ML IJ SOLN
INTRAMUSCULAR | Status: AC
  Filled 2014-07-06: qty 1

## 2014-07-06 MED ORDER — CETYLPYRIDINIUM CHLORIDE 0.05 % MT LIQD
7.0000 mL | Freq: Two times a day (BID) | OROMUCOSAL | Status: DC
  Administered 2014-07-06 – 2014-07-22 (×32): 7 mL via OROMUCOSAL

## 2014-07-06 MED ORDER — PANTOPRAZOLE SODIUM 40 MG IV SOLR
40.0000 mg | Freq: Two times a day (BID) | INTRAVENOUS | Status: DC
  Administered 2014-07-06 – 2014-07-18 (×25): 40 mg via INTRAVENOUS
  Filled 2014-07-06 (×28): qty 40

## 2014-07-06 MED ORDER — POTASSIUM CHLORIDE 10 MEQ/100ML IV SOLN
10.0000 meq | Freq: Once | INTRAVENOUS | Status: AC
  Administered 2014-07-06: 10 meq via INTRAVENOUS
  Filled 2014-07-06: qty 100

## 2014-07-06 NOTE — Progress Notes (Signed)
Attempted to place PIV x2 for IV potassium administration.  Unable to start PIV.  IV team notified and aware.  Asked MD about potential for PICC placement.  Per MD, will assess need tomorrow.  Will continue to monitor.

## 2014-07-06 NOTE — Evaluation (Signed)
Physical Therapy Evaluation Patient Details Name: [name] MRN: [id number] DOB: [birth year] Today's Date: 07/06/2014   History of Present Illness  Pt is an 80 y/o male admitted with an abnormal GI x-ray. Pt had an EGD 07/06/14 and was diagnosed with acute gastric distention, gastric pneumatosis, gastroparesis, and a motility disorder.  Clinical Impression  Pt admitted with the above. Pt currently with functional limitations due to the deficits listed below (see PT Problem List). At the time of PT eval pt was able to perform transfers with mod assist. Further gait training deferred for safety as +2 assist was not available. Pt will benefit from skilled PT to increase their independence and safety with mobility to allow discharge to the venue listed below. May want to consider SNF if pt does not progress functionally in following PT sessions.      Follow Up Recommendations Home health PT;Supervision/Assistance - 24 hour    Equipment Recommendations  None recommended by PT    Recommendations for Other Services       Precautions / Restrictions Precautions Precautions: Fall Restrictions Weight Bearing Restrictions: No      Mobility  Bed Mobility Overal bed mobility: Needs Assistance Bed Mobility: Rolling;Sidelying to Sit Rolling: Mod assist Sidelying to sit: Mod assist       General bed mobility comments: Frequent cues for technique. Assist for elevation of trunk into full sitting position.   Transfers Overall transfer level: Needs assistance Equipment used: 1 person hand held assist Transfers: Sit to/from [facility] Sit to Stand: Mod assist Stand pivot transfers: Mod assist       General transfer comment: VC's for hand placement on seated surface for safety. Face-to-face transfer bed to chair. Unable to assist with LUE due to weakness, however was able to push up with RUE to initiate transfer.   Ambulation/Gait                Stairs            Wheelchair Mobility    Modified Rankin (Stroke Patients Only)       Balance Overall balance assessment: Needs assistance Sitting-balance support: Feet supported;No upper extremity supported Sitting balance-Leahy Scale: Poor   Postural control: Posterior lean Standing balance support: Single extremity supported Standing balance-Leahy Scale: Poor                               Pertinent Vitals/Pain Pain Assessment: No/denies pain    Home Living Family/patient expects to be discharged to:: Private residence Living Arrangements: Spouse/significant other Available Help at Discharge: Family;Available 24 hours/day Type of Home: Mobile home Home Access: Ramped entrance     Home Layout: One level Home Equipment: Tub bench;Cane - quad;Wheelchair - manual;Other (comment) (Hemi-walker)      Prior Function Level of Independence: Needs assistance   Gait / Transfers Assistance Needed: Wife states she is present for transfers "just in case" however pt can perform transfers with his hemi-walker only.   ADL's / Homemaking Assistance Needed: Wife assists with ADL's        Hand Dominance   Dominant Hand: Right    Extremity/Trunk Assessment   Upper Extremity Assessment: Defer to OT evaluation (LUE weakness)           Lower Extremity Assessment: Generalized weakness      Cervical / Trunk Assessment: Kyphotic  Communication   Communication: No difficulties  Cognition Arousal/Alertness: Awake/alert Behavior During Therapy: WFL for tasks  assessed/performed Overall Cognitive Status: Within Functional Limits for tasks assessed                      General Comments      Exercises        Assessment/Plan    PT Assessment Patient needs continued PT services  PT Diagnosis Difficulty walking;Generalized weakness   PT Problem List Decreased strength;Decreased range of motion;Decreased activity tolerance;Decreased balance;Decreased  mobility;Decreased knowledge of use of DME;Decreased safety awareness;Decreased knowledge of precautions  PT Treatment Interventions DME instruction;Gait training;Stair training;Functional mobility training;Therapeutic activities;Therapeutic exercise;Neuromuscular re-education;Patient/family education   PT Goals (Current goals can be found in the Care Plan section) Acute Rehab PT Goals Patient Stated Goal: Return home PT Goal Formulation: With patient/family Time For Goal Achievement: 07/20/14 Potential to Achieve Goals: Good    Frequency Min 3X/week   Barriers to discharge        Co-evaluation               End of Session Equipment Utilized During Treatment: Gait belt;Oxygen Activity Tolerance: Patient limited by lethargy Patient left: in chair;with call bell/phone within reach;with family/visitor present Nurse Communication: Mobility status         Time: 9024-0973 PT Time Calculation (min): 37 min   Charges:   PT Evaluation $Initial PT Evaluation Tier I: 1 Procedure PT Treatments $Therapeutic Activity: 23-37 mins   PT G Codes:          Rolinda Roan 07/06/2014, 1:31 PM  Rolinda Roan, PT, DPT Acute Rehabilitation Services Pager: [phone]

## 2014-07-06 NOTE — Op Note (Signed)
[facility] [place], [id number]   ENDOSCOPY PROCEDURE REPORT  PATIENT: Curtis, [name]  MR#: [id number] BIRTHDATE: [birth year] , 61  yrs. old GENDER: male ENDOSCOPIST:Syrus Avilene Marrin, MD REFERRED BY: [name], M.D. PROCEDURE DATE:  07/06/2014 PROCEDURE:   EGD, diagnostic ASA CLASS:    Class IV INDICATIONS: Abnormal CT of stomach--gastric wall emphysema. MEDICATION: None TOPICAL ANESTHETIC:   none  DESCRIPTION OF PROCEDURE:   After the risks and benefits of the procedure were explained, informed consent was obtained.  The Pentax Gastroscope [id number]  endoscope was introduced through the mouth  and advanced to the second portion of the duodenum .  The instrument was slowly withdrawn as the mucosa was fully examined.    This patient has tenuous overall status, including COPD, advanced age, and is very frail, so I elected to attempt the exam with no sedation, using the pediatric upper endoscope, which was successfully passed alongside his NG tube.  The esophagus had some exudate above the GE junction consistent with some irritation from the NG tube, but was otherwise normal.  I did not see ulcerations or reflux esophagitis, nor any varices, neoplasia, or infection.  The stomach was entered.  It contained a small clear bilious residual without blood or coffee-ground material.  The gastric mucosa looked a little bit edematous in the fundic region, but was otherwise normal.  I did not see erythema or other evidence of gastritis, nor any erosions, ulcers, polyps, masses, or mucosal hemorrhages.  Specifically, I do not see anything that looked like mucosal ischemia.  The pylorus, duodenal bulb, and second duodenum looked normal.  As had been observed in his recent GI series, there was no evidence of any anatomic gastric outlet obstruction.  The scope was then removed from the patient.  No biopsies were obtained.       The scope was  then withdrawn from the patient and the procedure completed.  COMPLICATIONS: There were no immediate complications.  ENDOSCOPIC IMPRESSION: 1. Essentially normal examination. 2. No abnormalities to correlate with gastric mural pneumatosis observed on CT scan several days ago 3. No evidence of anatomic gastric outlet obstruction, which implies that the patient's high-volume NG output are probably related to gastric dysmotility, presumably a temporary  consequence of the process (whatever it was) that led to the gastric pneumatosis.  RECOMMENDATIONS: Continue supportive care. When the NG output decreases, trial of tube feedings before advancing to solid food. Consider TNA in the meantime, in view of this patient's very debilitated status. Consider trial of IV Reglan, a medication which I generally try to avoid using in the elderly, but which might help reestablish gastric motility.   _______________________________ eSignedRonald Lobo, MD 07/06/2014 9:40 AM     cc: Dr. [name]  CPT CODES: ICD CODES:  The ICD and CPT codes recommended by this software are interpretations from the data that the clinical staff has captured with the software.  The verification of the translation of this report to the ICD and CPT codes and modifiers is the [name] responsibility of the health care institution and practicing physician where this report was generated.  [employer]. will not be held responsible for the validity of the ICD and CPT codes included on this report.  AMA assumes no liability for data contained or not contained herein. CPT is a Designer, television/film set of the [employer].  PATIENT NAME:  Curtis, [name] MR#: [id number]

## 2014-07-06 NOTE — Interval H&P Note (Signed)
History and Physical Interval Note:  07/06/2014 9:11 AM  [name]  has presented today for surgery, with the diagnosis of Abnormal GI x-ray  The various methods of treatment have been discussed with the patient and family. After consideration of risks, benefits and other options for treatment, the patient has consented to  Procedure(s): ESOPHAGOGASTRODUODENOSCOPY (EGD) WITH PROPOFOL (N/A) as a surgical intervention .  The patient's history has been reviewed, patient examined, no change in status, stable for surgery.  I have reviewed the patient's chart and labs.  Questions were answered to the patient's satisfaction.     Cleotis Nipper

## 2014-07-06 NOTE — Progress Notes (Signed)
General surgery attending:  I personally interviewed and examined this patient today.  agree with the plan and assessment above. He has acute gastric distention, he gastric pneumatosis, gastroparesis, and a motility disorder. There is no evidence of mechanical obstruction or neoplasia or ischemia. There is no indication for surgical intervention. This condition will need to be treated medically. We will sign off.   [name]. Dalbert Batman, M.D., [facility] General and Minimally invasive Surgery Breast and Colorectal Surgery Office:   [phone] Pager:   [phone]

## 2014-07-06 NOTE — Progress Notes (Signed)
Per NP on call, x-ray to re-confirm placement on NG tube unnecessary as placement has already previously been confirmed.  Night RN notified and aware.

## 2014-07-06 NOTE — Progress Notes (Signed)
EGD was well tolerated, and surprisingly, looked entirely normal.  Have discussed recommendations with the patient's hospitalist physician, Dr. Tyrell Antonio.  Please see dictated procedure report for details.  Cleotis Nipper, M.D. [phone]

## 2014-07-06 NOTE — Progress Notes (Signed)
[facility] PROGRESS NOTE  [name]:[id number] DOB: January 13, 1928 DOA: 07/01/2014 PCP: [name], MD   brief narrative 80 y.o. male with past medical history of Dysrhythmia; Hypertension; Shortness of breath; Claudication of calf muscles (11/05/2012); GERD (gastroesophageal reflux disease); TIA (transient ischemic attack) (2010); Peripheral vascular disease; On home oxygen therapy; Neuromuscular disorder; Macular degeneration, bilateral; Coronary artery disease; COPD on home o2; Type II diabetes mellitus; hx if Stroke (05/2013); Arthritis; and Pneumonia. Presented with 2 day hx of nausea vomiting and diarrhea. CT abdomen and pelvis on admission showed significant distended stomach filled with air and fluid with decompression of pylorus and first segment of duodenum suggestive of diffuse gastric emphysema. Patient admitted to medicine and NG placed with high output. Surgery following.   Assessment/Plan: Gastric emphysema// Gastric dysmotility:   Possibly due to ongoing vomiting prior to admission. -Gastric output at 550 today yesterday 1.5 L.   -Keep NPO. Empiric zosyn and fluconazole. Holding po meds. IV fluids and Pain control with prn morphine.  -On schedule Reglan for gastric motility -Surgery sign off no indication for surgery and GI following. No signs of obstruction on imaging. -Endoscopy: was normal.  -Will start Protonix, GI prophylaxis.   -Might need to start TPN soon.  gastroenteritis Supportive care with fluids, antiemetics and pain control No further diarrhea since admission.   Acute renal failure:  Appears to be prerenal with severe dehydration secondary to high gastric output Continue with IV fluids.  -Monitor in a.m. -Cr peak to 2.1. Cr trending down at 1.7  Hypernatremia Secondary to dehydration Change fluids to D 5. Repeat labs in am.   Afib on low dose scheduled IV lopressor. Po Coreg and ASA  on hold  COPD Prn nebs  Recurrent left pleural  effusion chronic , followed by thoracic surgery requiring thoracentesis. Non malignant effusion.     HTN  off home po meds. Iv lopressor and prn hydralazine  DM Hold metformin. SSI (sensitive )  Hypokalemia Replenish with KCl   DVT prophylaxis SQ heparin  Diet: N.p.o.    Code Status: full code Family Communication:wife at bedside Disposition Plan: continue inpt monitoring   Consultants:  [facility]  [facility]  Procedures:  [name]  Antibiotics:  IV zosyn 10/10--   IV fluconazole 10/10--  HPI/Subjective: Patient wants to eat, no diarrhea. No nausea.   Objective: Filed Vitals:   07/06/14 1336  BP: 143/97  Pulse: 94  Temp: 97.5 F (36.4 C)  Resp: 16    Intake/Output Summary (Last 24 hours) at 07/06/14 1601 Last data filed at 07/06/14 1531  Gross per 24 hour  Intake 3275.92 ml  Output   1445 ml  Net 1830.92 ml   Filed Weights   07/04/14 0500 07/05/14 0440 07/06/14 0438  Weight: 58 kg (127 lb 13.9 oz) 56.2 kg (123 lb 14.4 oz) 55.8 kg (123 lb 0.3 oz)    Exam:   General:  Elderly male in NAD  HEENT: NG in place with high output, dry mucosa  Cardiovascular: S1&S2 irregular, no murmurs  Respiratory: clear b/l  Abdomen: soft,  Non dnstended, no tenderness today, bowel sounds present,    Musculoskeletal: warm, no edema   Data Reviewed: Basic Metabolic Panel:  Recent Labs Lab 07/01/14 2219 07/01/14 2233 07/02/14 0405 07/04/14 1020 07/05/14 0421 07/06/14 0258  NA 149* 146 147 153* 157* 156*  K 3.5* 3.2* 3.8 3.3* 3.4* 3.6*  CL 97 97 96 98 101 101  CO2 32  --  25 38* 36* 38*  GLUCOSE 150* 157* 180* 139* 120* 88  BUN 24* 24* 27* 66* 69* 63*  CREATININE 1.06 1.10 1.08 2.12* 2.09* 1.76*  CALCIUM 9.4  --  9.0 8.5 8.7 9.0  MG 1.8  --  1.6  --   --   --   PHOS  --   --  5.9*  --   --   --    Liver Function Tests:  Recent Labs Lab 07/01/14 2219 07/02/14 0405 07/05/14 0421  AST 45* 41* 23  ALT 30 27 16   ALKPHOS 124* 113 74  BILITOT 0.7  0.5 0.6  PROT 7.9 7.0 6.6  ALBUMIN 3.4* 3.0* 2.6*   No results found for this basename: LIPASE, AMYLASE,  in the last 168 hours No results found for this basename: AMMONIA,  in the last 168 hours CBC:  Recent Labs Lab 07/01/14 2219 07/01/14 2233 07/02/14 0405 07/05/14 0421  WBC 7.3  --  7.2 9.1  NEUTROABS 5.1  --   --  5.7  HGB 11.5* 12.2* 11.6* 12.7*  HCT 34.0* 36.0* 34.9* 40.1  MCV 95.0  --  97.5 101.8*  PLT 281  --  241 206   Cardiac Enzymes: No results found for this basename: CKTOTAL, CKMB, CKMBINDEX, TROPONINI,  in the last 168 hours BNP (last 3 results) No results found for this basename: PROBNP,  in the last 8760 hours CBG:  Recent Labs Lab 07/05/14 2002 07/05/14 2355 07/06/14 0437 07/06/14 0853 07/06/14 1234  GLUCAP 94 87 88 89 96    Recent Results (from the past 240 hour(s))  CULTURE, BLOOD (ROUTINE X 2)     Status: [name]   Collection Time    07/02/14  4:24 AM      Result Value Ref Range Status   Specimen Description BLOOD LEFT HAND   Final   Special Requests BOTTLES DRAWN AEROBIC AND ANAEROBIC [place]   Final   Culture  Setup Time     Final   Value: 07/02/2014 11:05     Performed at [facility]   Culture     Final   Value:        BLOOD CULTURE RECEIVED NO GROWTH TO DATE CULTURE WILL BE HELD FOR 5 DAYS BEFORE ISSUING A FINAL NEGATIVE REPORT     Performed at [facility]   Report Status PENDING   Incomplete  CULTURE, BLOOD (ROUTINE X 2)     Status: [name]   Collection Time    07/02/14  4:31 AM      Result Value Ref Range Status   Specimen Description BLOOD RIGHT FOREARM   Final   Special Requests BOTTLES DRAWN AEROBIC AND ANAEROBIC 5CC EACH   Final   Culture  Setup Time     Final   Value: 07/02/2014 11:05     Performed at [facility]   Culture     Final   Value:        BLOOD CULTURE RECEIVED NO GROWTH TO DATE CULTURE WILL BE HELD FOR 5 DAYS BEFORE ISSUING A FINAL NEGATIVE REPORT     Performed at [facility]    Report Status PENDING   Incomplete  URINE CULTURE     Status: [name]   Collection Time    07/02/14 11:41 AM      Result Value Ref Range Status   Specimen Description URINE, RANDOM   Final   Special Requests [name]   Final   Culture  Setup Time  Final   Value: 07/02/2014 19:38     Performed at [place]     Final   Value: NO GROWTH     Performed at [facility]   Culture     Final   Value: NO GROWTH     Performed at [facility]   Report Status 07/03/2014 FINAL   Final     Studies: Dg Abd 1 View  07/05/2014   CLINICAL DATA:  Abdominal pain.  EXAM: ABDOMEN - 1 VIEW  COMPARISON:  July 03, 2014.  FINDINGS: Normal bowel gas pattern is noted. Nasogastric tube tip is in proximal stomach. Moderate amount of residual contrast remains within the cecum. There is decreased amount of contrast in the remaining portions of the colon. Phlebolith is noted in the left side of the pelvis. Contrast is noted within the urinary bladder.  IMPRESSION: No evidence of bowel obstruction or ileus. Moderate amount of residual contrast remains within the cecum. Intravenous contrast is also noted within the urinary bladder.   Electronically Signed   By: Sabino Dick M.D.   On: 07/05/2014 08:30    Scheduled Meds: . antiseptic oral rinse  7 mL Mouth Rinse BID  . antiseptic oral rinse  7 mL Mouth Rinse q12n4p  . chlorhexidine  15 mL Mouth Rinse BID  . fluconazole (DIFLUCAN) IV  200 mg Intravenous Q24H  . heparin subcutaneous  5,000 Units Subcutaneous 3 times per day  . insulin aspart  0-9 Units Subcutaneous 6 times per day  . metoCLOPramide (REGLAN) injection  10 mg Intravenous 4 times per day  . metoprolol  2.5 mg Intravenous 3 times per day  . piperacillin-tazobactam (ZOSYN)  IV  3.375 g Intravenous Q8H  . potassium chloride  10 mEq Intravenous Q1 Hr x 3  . simvastatin  40 mg Oral QPM  . sodium chloride  3 mL Intravenous Q12H  . tiotropium  18 mcg Inhalation Daily    Continuous Infusions: . dextrose 125 mL/hr at 07/06/14 1455      Time spent: 25 minutes    [place], [place] [facility] Pager [phone]. If 7PM-7AM, please contact night-coverage at [email or web address], password [facility] 07/06/2014, 4:01 PM  LOS: 5 days

## 2014-07-06 NOTE — Progress Notes (Signed)
Patient ID: [name], male   DOB: July 08, 1928, 80 y.o.   MRN: [id number] Day of Surgery  Subjective: Pt feels well.  Wants to eat.  Objective: Vital signs in last 24 hours: Temp:  [97.2 F (36.2 C)-98.1 F (36.7 C)] 97.2 F (36.2 C) (10/14 0438) Pulse Rate:  [36-103] 95 (10/14 1010) Resp:  [14-21] 18 (10/14 1010) BP: (106-150)/(59-95) 128/66 mmHg (10/14 1010) SpO2:  [83 %-100 %] 98 % (10/14 1010) Weight:  [123 lb 0.3 oz (55.8 kg)] 123 lb 0.3 oz (55.8 kg) (10/14 0438) Last BM Date: 07/01/14  Intake/Output from previous day: 10/13 0701 - 10/14 0700 In: 3566.7 [I.V.:3016.7; IV Piggyback:550] Out: 1070 [Urine:520; Emesis/NG output:550] Intake/Output this shift: Total I/O In: 3 [I.V.:3] Out: 50 [Emesis/NG output:50]  PE: Abd: soft, NT, ND, +BS  Lab Results:   Recent Labs  07/05/14 0421  WBC 9.1  HGB 12.7*  HCT 40.1  PLT 206   BMET  Recent Labs  07/05/14 0421 07/06/14 0258  NA 157* 156*  K 3.4* 3.6*  CL 101 101  CO2 36* 38*  GLUCOSE 120* 88  BUN 69* 63*  CREATININE 2.09* 1.76*  CALCIUM 8.7 9.0   PT/INR No results found for this basename: LABPROT, INR,  in the last 72 hours CMP     Component Value Date/Time   NA 156* 07/06/2014 0258   K 3.6* 07/06/2014 0258   CL 101 07/06/2014 0258   CO2 38* 07/06/2014 0258   GLUCOSE 88 07/06/2014 0258   BUN 63* 07/06/2014 0258   CREATININE 1.76* 07/06/2014 0258   CALCIUM 9.0 07/06/2014 0258   PROT 6.6 07/05/2014 0421   ALBUMIN 2.6* 07/05/2014 0421   AST 23 07/05/2014 0421   ALT 16 07/05/2014 0421   ALKPHOS 74 07/05/2014 0421   BILITOT 0.6 07/05/2014 0421   GFRNONAA 34* 07/06/2014 0258   GFRAA 39* 07/06/2014 0258   Lipase     Component Value Date/Time   LIPASE 23 12/24/2013 1143       Studies/Results: Dg Abd 1 View  07/05/2014   CLINICAL DATA:  Abdominal pain.  EXAM: ABDOMEN - 1 VIEW  COMPARISON:  July 03, 2014.  FINDINGS: Normal bowel gas pattern is noted. Nasogastric tube tip is in proximal  stomach. Moderate amount of residual contrast remains within the cecum. There is decreased amount of contrast in the remaining portions of the colon. Phlebolith is noted in the left side of the pelvis. Contrast is noted within the urinary bladder.  IMPRESSION: No evidence of bowel obstruction or ileus. Moderate amount of residual contrast remains within the cecum. Intravenous contrast is also noted within the urinary bladder.   Electronically Signed   By: Sabino Dick M.D.   On: 07/05/2014 08:30   Dg Ugi W/water Sol Cm  07/04/2014   CLINICAL DATA:  Gastric emphysema.  Portal venous gas.  EXAM: WATER SOLUBLE UPPER GI SERIES  TECHNIQUE: Single-column upper GI series was performed using water soluble contrast.  CONTRAST:  124mL OMNIPAQUE IOHEXOL 300 MG/ML  SOLN  COMPARISON:  07/02/2014  FLUOROSCOPY TIME:  1 min, 25 seconds  FINDINGS: Because of the gastric emphysema and portal venous gas, I elected to use water-soluble contrast medium rather than barium in case there was perforation or emphysematous gastritis from gastric wall necrosis, or in case urgent gastric surgery was to be required.  I aspirated about 150 cc of bilious material from the nasogastric tube in order to empty the stomach.  A total of 180 cc of  Omnipaque 300 was injected through the nasogastric tube.  Equivocal rugal wall thickening. The gastric emphysema shown on CT is less readily apparent on fluoroscopic images but that does not mean that it has necessarily resolved.  Aside from slightly exaggerated lesser curvature of the stomach, no gastric morphologic abnormality is identified. Contrast flowed freely from the stomach into the proximal duodenum, without evidence of gastric outlet obstruction. No reflux into the esophagus was noted during the exam.  IMPRESSION: 1. The contour of the stomach is unremarkable aside from mild exaggeration of the lesser curvature. No gastric outlet obstruction is demonstrated ; this corresponds to the finding of  bilious gastric material which would be unusual in the setting of gastric outlet obstruction. 2. This was not a standard upper GI examination. The patient felt back pain, was delirious, and was not able to turn except very slight amounts, and the exam was performed in the supine in slightly oblique positions. The esophagus and pharynx were not assessed.   Electronically Signed   By: Sherryl Barters M.D.   On: 07/04/2014 14:38    Anti-infectives: Anti-infectives   Start     Dose/Rate Route Frequency Ordered Stop   07/02/14 1200  piperacillin-tazobactam (ZOSYN) IVPB 3.375 g     3.375 g 12.5 mL/hr over 240 Minutes Intravenous Every 8 hours 07/02/14 0421     07/02/14 0430  piperacillin-tazobactam (ZOSYN) IVPB 3.375 g  Status:  Discontinued     3.375 g 100 mL/hr over 30 Minutes Intravenous  Once 07/02/14 0421 07/02/14 1625   07/02/14 0400  fluconazole (DIFLUCAN) IVPB 200 mg     200 mg 100 mL/hr over 60 Minutes Intravenous Every 24 hours 07/02/14 0345     07/02/14 0000  cefTRIAXone (ROCEPHIN) 1 g in dextrose 5 % 50 mL IVPB  Status:  Discontinued     1 g 100 mL/hr over 30 Minutes Intravenous  Once 07/01/14 2354 07/01/14 2357       Assessment/Plan  1. Gastric pneumatosis with secondary gastric dysmotility   Plan: 1. UGI and EGD negative for ischemia or GOO.  Suspect gastric dysmotility secondary to his prior gastric pneumatosis.   2. I agree with the plan Dr. Cristina Gong has set into place.  There are no surgical indications for gastric dysmotility.  We will defer to GI for further treatment of this problem.  We will sign off.  Please call us as needed.   LOS: 5 days    Kaylianna Detert E 07/06/2014, 10:32 AM Pager: [phone]

## 2014-07-06 NOTE — Progress Notes (Signed)
Advanced NG tube approximately 2 inches per care order.  NP on call text paged to see if x-ray confirmation needed.  Night RN updated, who will continue to monitor.

## 2014-07-07 ENCOUNTER — Encounter (HOSPITAL_COMMUNITY): Payer: Self-pay | Admitting: Gastroenterology

## 2014-07-07 LAB — CBC
HCT: 38.7 % — ABNORMAL LOW (ref 39.0–52.0)
Hemoglobin: 12.4 g/dL — ABNORMAL LOW (ref 13.0–17.0)
MCH: 31.9 pg (ref 26.0–34.0)
MCHC: 32 g/dL (ref 30.0–36.0)
MCV: 99.5 fL (ref 78.0–100.0)
PLATELETS: 188 10*3/uL (ref 150–400)
RBC: 3.89 MIL/uL — AB (ref 4.22–5.81)
RDW: 13.9 % (ref 11.5–15.5)
WBC: 9.7 10*3/uL (ref 4.0–10.5)

## 2014-07-07 LAB — GLUCOSE, CAPILLARY
GLUCOSE-CAPILLARY: 136 mg/dL — AB (ref 70–99)
GLUCOSE-CAPILLARY: 166 mg/dL — AB (ref 70–99)
GLUCOSE-CAPILLARY: 168 mg/dL — AB (ref 70–99)
GLUCOSE-CAPILLARY: 174 mg/dL — AB (ref 70–99)
GLUCOSE-CAPILLARY: 233 mg/dL — AB (ref 70–99)
Glucose-Capillary: 154 mg/dL — ABNORMAL HIGH (ref 70–99)
Glucose-Capillary: 81 mg/dL (ref 70–99)

## 2014-07-07 LAB — BASIC METABOLIC PANEL
ANION GAP: 13 (ref 5–15)
BUN: 44 mg/dL — ABNORMAL HIGH (ref 6–23)
CO2: 36 mEq/L — ABNORMAL HIGH (ref 19–32)
Calcium: 8.8 mg/dL (ref 8.4–10.5)
Chloride: 101 mEq/L (ref 96–112)
Creatinine, Ser: 1.3 mg/dL (ref 0.50–1.35)
GFR, EST AFRICAN AMERICAN: 56 mL/min — AB (ref >90)
GFR, EST NON AFRICAN AMERICAN: 49 mL/min — AB (ref >90)
GLUCOSE: 162 mg/dL — AB (ref 70–99)
POTASSIUM: 3.3 meq/L — AB (ref 3.7–5.3)
Sodium: 150 mEq/L — ABNORMAL HIGH (ref 137–147)

## 2014-07-07 MED ORDER — POTASSIUM CHLORIDE 10 MEQ/100ML IV SOLN
10.0000 meq | INTRAVENOUS | Status: AC
  Administered 2014-07-07 (×3): 10 meq via INTRAVENOUS
  Filled 2014-07-07 (×3): qty 100

## 2014-07-07 MED ORDER — JEVITY 1.2 CAL PO LIQD
1000.0000 mL | ORAL | Status: DC
  Administered 2014-07-07: 1000 mL
  Filled 2014-07-07 (×2): qty 1000

## 2014-07-07 MED ORDER — JEVITY 1.2 CAL PO LIQD
1000.0000 mL | ORAL | Status: DC
  Administered 2014-07-07 – 2014-07-08 (×3): 1000 mL
  Filled 2014-07-07 (×3): qty 1000

## 2014-07-07 MED ORDER — PRO-STAT SUGAR FREE PO LIQD
30.0000 mL | ORAL | Status: DC
  Administered 2014-07-07 – 2014-07-17 (×11): 30 mL
  Filled 2014-07-07 (×12): qty 30

## 2014-07-07 NOTE — Progress Notes (Signed)
Dear Doctor:  This patient has been identified as a candidate for PICC for the following reason (s): poor veins/poor circulatory system (CHF, COPD, emphysema, diabetes, steroid use, IV drug abuse, etc.) If you agree, please write an order for the indicated device. For any questions contact the Vascular Access Team at [phone] if no answer, please leave a message.  Thank you for supporting the early vascular access assessment program. 

## 2014-07-07 NOTE — Progress Notes (Signed)
[facility] PROGRESS NOTE  [name] IWL:[id number] DOB: Aug 04, 1928 DOA: 07/01/2014 PCP: [name], MD   brief narrative 80 y.o. male with past medical history of Dysrhythmia; Hypertension; Shortness of breath; Claudication of calf muscles (11/05/2012); GERD (gastroesophageal reflux disease); TIA (transient ischemic attack) (2010); Peripheral vascular disease; On home oxygen therapy; Neuromuscular disorder; Macular degeneration, bilateral; Coronary artery disease; COPD on home o2; Type II diabetes mellitus; hx if Stroke (05/2013); Arthritis; and Pneumonia. Presented with 2 day hx of nausea vomiting and diarrhea. CT abdomen and pelvis on admission showed significant distended stomach filled with air and fluid with decompression of pylorus and first segment of duodenum suggestive of diffuse gastric emphysema. Patient admitted to medicine and NG placed with high output.   Assessment/Plan: Gastric emphysema// Gastric dysmotility:  -Gastric output at 450 yesterday, today at 200. -Clamp NG tube, start tube feeding, appreciate Dr Cristina Gong help.   -Keep NPO. Empiric zosyn  Day 6 and received 6 fluconazole. Will stop fluconazole.  -Holding po meds. IV fluids and Pain control with prn morphine.  -On schedule Reglan for gastric motility -Surgery sign off no indication for surgery and GI following. No signs of obstruction on imaging. -Endoscopy: was normal.  -Continue with Protonix, GI prophylaxis.    Gastroenteritis Supportive care with fluids, antiemetics and pain control No further diarrhea since admission.   Acute renal failure:  Appears to be prerenal with severe dehydration secondary to high gastric output Continue with IV fluids.  -Monitor in a.m. -Cr peak to 2.1. Cr trending down 1.3  Hypernatremia Secondary to dehydration Change fluids to D 5. Repeat labs in am.  Improved D 5.  Na 157---150.   Afib on low dose scheduled IV lopressor. Po Coreg and ASA  on hold  COPD Prn  nebs  Recurrent left pleural effusion Chronic , followed by thoracic surgery requiring thoracentesis. Non malignant effusion.     HTN  off home po meds. Iv lopressor and prn hydralazine  DM Hold metformin. SSI (sensitive )  Hypokalemia Replenish with KCl   DVT prophylaxis SQ heparin  Diet: N.p.o.    Code Status: full code Family Communication:will call  Disposition Plan: continue inpt monitoring   Consultants:  [facility]  [facility]  Procedures:  none  Antibiotics:  IV zosyn 10/10--   IV fluconazole 10/10--  HPI/Subjective: He is sleepy but wake up and answer some questions. He denies abdominal pain. Complaining of pain right arm.  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   Objective: Filed Vitals:   07/07/14 1125  BP: 113/63  Pulse: 100  Temp: 98.4 F (36.9 C)  Resp: 18    Intake/Output Summary (Last 24 hours) at 07/07/14 1310 Last data filed at 07/07/14 0900  Gross per 24 hour  Intake 2795.83 ml  Output   1895 ml  Net 900.83 ml   Filed Weights   07/05/14 0440 07/06/14 0438 07/07/14 0503  Weight: 56.2 kg (123 lb 14.4 oz) 55.8 kg (123 lb 0.3 oz) 60.2 kg (132 lb 11.5 oz)    Exam:   General:  Elderly male in NAD  HEENT: NG in place with high output, dry mucosa  Cardiovascular: S1&S2 irregular, no murmurs  Respiratory: clear b/l  Abdomen: soft,  Non dnstended, no tenderness today, bowel sounds present,    Musculoskeletal: warm, no edema   Data Reviewed: Basic Metabolic Panel:  Recent Labs Lab 07/01/14 2219  07/02/14 0405 07/04/14 1020 07/05/14 0421 07/06/14 0258 07/07/14 0651  NA 149*  < >  147 153* 157* 156* 150*  K 3.5*  < > 3.8 3.3* 3.4* 3.6* 3.3*  CL 97  < > 96 98 101 101 101  CO2 32  --  25 38* 36* 38* 36*  GLUCOSE 150*  < > 180* 139* 120* 88 162*  BUN 24*  < > 27* 66* 69* 63* 44*  CREATININE 1.06  < > 1.08 2.12* 2.09* 1.76* 1.30  CALCIUM 9.4  --  9.0 8.5 8.7 9.0 8.8  MG 1.8  --  1.6  --   --   --   --   PHOS  --    --  5.9*  --   --   --   --   < > = values in this interval not displayed. Liver Function Tests:  Recent Labs Lab 07/01/14 2219 07/02/14 0405 07/05/14 0421  AST 45* 41* 23  ALT 30 27 16   ALKPHOS 124* 113 74  BILITOT 0.7 0.5 0.6  PROT 7.9 7.0 6.6  ALBUMIN 3.4* 3.0* 2.6*   No results found for this basename: LIPASE, AMYLASE,  in the last 168 hours No results found for this basename: AMMONIA,  in the last 168 hours CBC:  Recent Labs Lab 07/01/14 2219 07/01/14 2233 07/02/14 0405 07/05/14 0421 07/07/14 0651  WBC 7.3  --  7.2 9.1 9.7  NEUTROABS 5.1  --   --  5.7  --   HGB 11.5* 12.2* 11.6* 12.7* 12.4*  HCT 34.0* 36.0* 34.9* 40.1 38.7*  MCV 95.0  --  97.5 101.8* 99.5  PLT 281  --  241 206 188   Cardiac Enzymes: No results found for this basename: CKTOTAL, CKMB, CKMBINDEX, TROPONINI,  in the last 168 hours BNP (last 3 results) No results found for this basename: PROBNP,  in the last 8760 hours CBG:  Recent Labs Lab 07/06/14 2043 07/07/14 0001 07/07/14 0504 07/07/14 0823 07/07/14 1051  GLUCAP 123* 168* 154* 166* 174*    Recent Results (from the past 240 hour(s))  CULTURE, BLOOD (ROUTINE X 2)     Status: None   Collection Time    07/02/14  4:24 AM      Result Value Ref Range Status   Specimen Description BLOOD LEFT HAND   Final   Special Requests BOTTLES DRAWN AEROBIC AND ANAEROBIC [facility] EACH   Final   Culture  Setup Time     Final   Value: 07/02/2014 11:05     Performed at [facility]   Culture     Final   Value:        BLOOD CULTURE RECEIVED NO GROWTH TO DATE CULTURE WILL BE HELD FOR 5 DAYS BEFORE ISSUING A FINAL NEGATIVE REPORT     Performed at [facility]   Report Status PENDING   Incomplete  CULTURE, BLOOD (ROUTINE X 2)     Status: None   Collection Time    07/02/14  4:31 AM      Result Value Ref Range Status   Specimen Description BLOOD RIGHT FOREARM   Final   Special Requests BOTTLES DRAWN AEROBIC AND ANAEROBIC 5CC EACH   Final    Culture  Setup Time     Final   Value: 07/02/2014 11:05     Performed at [facility]   Culture     Final   Value:        BLOOD CULTURE RECEIVED NO GROWTH TO DATE CULTURE WILL BE HELD FOR 5 DAYS BEFORE ISSUING A FINAL NEGATIVE  REPORT     Performed at [facility]   Report Status PENDING   Incomplete  URINE CULTURE     Status: None   Collection Time    07/02/14 11:41 AM      Result Value Ref Range Status   Specimen Description URINE, RANDOM   Final   Special Requests NONE   Final   Culture  Setup Time     Final   Value: 07/02/2014 19:38     Performed at [place]     Final   Value: NO GROWTH     Performed at [facility]   Culture     Final   Value: NO GROWTH     Performed at [facility]   Report Status 07/03/2014 FINAL   Final     Studies: No results found.  Scheduled Meds: . antiseptic oral rinse  7 mL Mouth Rinse BID  . antiseptic oral rinse  7 mL Mouth Rinse q12n4p  . chlorhexidine  15 mL Mouth Rinse BID  . fluconazole (DIFLUCAN) IV  200 mg Intravenous Q24H  . heparin subcutaneous  5,000 Units Subcutaneous 3 times per day  . insulin aspart  0-9 Units Subcutaneous 6 times per day  . metoCLOPramide (REGLAN) injection  10 mg Intravenous 4 times per day  . metoprolol  2.5 mg Intravenous 3 times per day  . pantoprazole (PROTONIX) IV  40 mg Intravenous Q12H  . piperacillin-tazobactam (ZOSYN)  IV  3.375 g Intravenous Q8H  . potassium chloride  10 mEq Intravenous Q1 Hr x 3  . sodium chloride  3 mL Intravenous Q12H  . tiotropium  18 mcg Inhalation Daily   Continuous Infusions: . dextrose 125 mL (07/07/14 1019)  . feeding supplement (JEVITY 1.2 CAL)        Time spent: 25 minutes    Macrae Wiegman A  [facility] Pager [phone]. If 7PM-7AM, please contact night-coverage at [email or web address], password [facility] 07/07/2014, 1:10 PM  LOS: 6 days

## 2014-07-07 NOTE — Progress Notes (Signed)
NG output has decreased considerably. At my request, a nursing instructor flushed the NG tube and aspirated and only returned about 20 mL.  On exam, the abdomen is scaphoid, nondistended, and without succussion splash. It is nontender.  Impression: Resolving gastroparesis as a consequence of a "stunned" stomach following some sort of insult (? Ischemic) manifesting as gastric mural pneumatosis.  Plan:  1. Stop NG suction 2. Start tube feeding, assessing for large residuals 3. If tube feeding is tolerated, after a day or 2, perhaps we could transition the patient to oral nutrition.  Cleotis Nipper, M.D. [phone]

## 2014-07-07 NOTE — Progress Notes (Signed)
NUTRITION FOLLOW UP/CONSULT  Intervention:   Continue Jevity 1.2 @ 20 ml/hr via NGT and increase by 10 ml every 4 hours to goal rate of 60 ml/hr.  30 ml Pro-stat once daily. Tube feeding regimen provides 1828 kcal (100% of needs), 95 grams of protein, and 1166 ml of H2O.  When IV fluids are discontinued, add 120 ml free water flushes every 4 hours to provide and additional 720 ml daily and a total of 1886 ml per 24 hours.   Nutrition Dx:   1) Increased nutrient needs related to chronic illness as evidenced by estimated nutrition needs; ongoing  2) Inadequate oral intake related to inability to eat as evidenced by NPO status; ongoing  Goal:   Pt to meet >/= 90% of their estimated nutrition needs; ongoing  Monitor:   TF rate/tolerance, diet advancement/PO intake, weight trend, labs, I/O's  Assessment:   Pt with PMH of Dysrhythmia; HTN; SOB; Claudication of calf muscles; GERD; TIA ; Peripheral vascular disease; On home oxygen therapy; Neuromuscular disorder; Macular degeneration, bilateral; Coronary artery disease; COPD; T2DM; Stroke; Arthritis; and Pneumonia. Presented with 2 day hx of nausea vomiting and diarrhea.  RD consulted for tube feeding management. Per MD note, NG output has decreased considerably and NG suction discontinued today with plans to start tube feeding. Pt asleep at time of visit with NGT in place and Jevity 1.2 infusing at 50 ml/hr. Per RN TF was just initiated will decrease to 20 ml/hr and advance more slowly to goal rate. Pt's weight has dropped and additional 4 lbs since 10/10.  Labs reviewed. Elevated glucose, high sodium, low potassium, elevated BUN, decreased GFR. I/O's: +6.3 L since adminssion  Height: Ht Readings from Last 1 Encounters:  07/02/14 5\' 6"  (1.676 m)    Weight Status:   Wt Readings from Last 1 Encounters:  07/07/14 132 lb 11.5 oz (60.2 kg)    Re-estimated needs:  Kcal: 1700-1900  Protein: 90-100 gm  Fluid: 1.6-1.8 L  Skin: Stage I  pressure ulcer on sacrum; non-pitting RLE and LLE edema  Diet Order: NPO   Intake/Output Summary (Last 24 hours) at 07/07/14 1421 Last data filed at 07/07/14 1000  Gross per 24 hour  Intake 3295.83 ml  Output   1895 ml  Net 1400.83 ml    Last BM: 10/9   Labs:   Recent Labs Lab 07/01/14 2219  07/02/14 0405  07/05/14 0421 07/06/14 0258 07/07/14 0651  NA 149*  < > 147  < > 157* 156* 150*  K 3.5*  < > 3.8  < > 3.4* 3.6* 3.3*  CL 97  < > 96  < > 101 101 101  CO2 32  --  25  < > 36* 38* 36*  BUN 24*  < > 27*  < > 69* 63* 44*  CREATININE 1.06  < > 1.08  < > 2.09* 1.76* 1.30  CALCIUM 9.4  --  9.0  < > 8.7 9.0 8.8  MG 1.8  --  1.6  --   --   --   --   PHOS  --   --  5.9*  --   --   --   --   GLUCOSE 150*  < > 180*  < > 120* 88 162*  < > = values in this interval not displayed.  CBG (last 3)   Recent Labs  07/07/14 0504 07/07/14 0823 07/07/14 1051  GLUCAP 154* 166* 174*    Scheduled Meds: . antiseptic oral rinse  7 mL Mouth Rinse BID  . antiseptic oral rinse  7 mL Mouth Rinse q12n4p  . chlorhexidine  15 mL Mouth Rinse BID  . heparin subcutaneous  5,000 Units Subcutaneous 3 times per day  . insulin aspart  0-9 Units Subcutaneous 6 times per day  . metoCLOPramide (REGLAN) injection  10 mg Intravenous 4 times per day  . metoprolol  2.5 mg Intravenous 3 times per day  . pantoprazole (PROTONIX) IV  40 mg Intravenous Q12H  . piperacillin-tazobactam (ZOSYN)  IV  3.375 g Intravenous Q8H  . sodium chloride  3 mL Intravenous Q12H  . tiotropium  18 mcg Inhalation Daily    Continuous Infusions: . dextrose 125 mL (07/07/14 1019)  . feeding supplement (JEVITY 1.2 CAL)      Pryor Ochoa RD, LDN Inpatient Clinical Dietitian Pager: [phone] After Hours Pager: [phone]

## 2014-07-08 LAB — GLUCOSE, CAPILLARY
GLUCOSE-CAPILLARY: 229 mg/dL — AB (ref 70–99)
GLUCOSE-CAPILLARY: 181 mg/dL — AB (ref 70–99)
Glucose-Capillary: 200 mg/dL — ABNORMAL HIGH (ref 70–99)
Glucose-Capillary: 232 mg/dL — ABNORMAL HIGH (ref 70–99)
Glucose-Capillary: 183 mg/dL — ABNORMAL HIGH (ref 70–99)
Glucose-Capillary: 121 mg/dL — ABNORMAL HIGH (ref 70–99)

## 2014-07-08 LAB — BASIC METABOLIC PANEL
Anion gap: 11 (ref 5–15)
BUN: 37 mg/dL — AB (ref 6–23)
CHLORIDE: 101 meq/L (ref 96–112)
CO2: 33 meq/L — AB (ref 19–32)
Calcium: 8.5 mg/dL (ref 8.4–10.5)
Creatinine, Ser: 1.19 mg/dL (ref 0.50–1.35)
GFR calc non Af Amer: 54 mL/min — ABNORMAL LOW (ref >90)
GFR, EST AFRICAN AMERICAN: 63 mL/min — AB (ref >90)
Glucose, Bld: 163 mg/dL — ABNORMAL HIGH (ref 70–99)
POTASSIUM: 3.6 meq/L — AB (ref 3.7–5.3)
Sodium: 145 mEq/L (ref 137–147)

## 2014-07-08 LAB — CULTURE, BLOOD (ROUTINE X 2)
Culture: NO GROWTH
Culture: NO GROWTH

## 2014-07-08 LAB — CBC
HEMATOCRIT: 35.4 % — AB (ref 39.0–52.0)
HEMOGLOBIN: 11.3 g/dL — AB (ref 13.0–17.0)
MCH: 31.8 pg (ref 26.0–34.0)
MCHC: 31.9 g/dL (ref 30.0–36.0)
MCV: 99.7 fL (ref 78.0–100.0)
Platelets: 181 10*3/uL (ref 150–400)
RBC: 3.55 MIL/uL — AB (ref 4.22–5.81)
RDW: 13.8 % (ref 11.5–15.5)
WBC: 9.9 10*3/uL (ref 4.0–10.5)

## 2014-07-08 MED ORDER — POTASSIUM CHLORIDE 10 MEQ/100ML IV SOLN
10.0000 meq | INTRAVENOUS | Status: AC
  Administered 2014-07-08 (×2): 10 meq via INTRAVENOUS
  Filled 2014-07-08 (×2): qty 100

## 2014-07-08 MED ORDER — POTASSIUM CHLORIDE 10 MEQ/100ML IV SOLN
10.0000 meq | INTRAVENOUS | Status: AC
  Administered 2014-07-08: 10 meq via INTRAVENOUS
  Filled 2014-07-08 (×3): qty 100

## 2014-07-08 NOTE — Progress Notes (Signed)
Pt receiving tube feeding 72ml/hr, residual at 1930 >500. Per order to re-check residual in 1 hour. Dr. Cristina Gong at bedside with RN to check residual at 2040. Residual 850. Per MD new order to d/c tube feeding and connect NG tube to low-intermittent suction.    Pt coughing up thick yellow/clear sputum, lungs clear to auscultation. Pt complaining of pain to left elbow. Pink blanchable area noted to left elbow. Pink foam placed to elbow and placed on pillow. Pt resting comfortably. Will continue to monitor.

## 2014-07-08 NOTE — Progress Notes (Signed)
Unfortunately, the patient has had very large residuals since starting tube feeding. This evening, it was over 800 ML's. Accordingly, I have discontinued his tube feeding and ordered for him to be put back on NG suction.  Perhaps in a couple of days, we can go back to tube feeding, at a slower rate (he was at 60 MLS per hour). If that is not tolerated, we may have to consider a PEG/PEJ tube, with jejunal tube feedings.  Cleotis Nipper, M.D. [phone]

## 2014-07-08 NOTE — Progress Notes (Signed)
ANTIBIOTIC CONSULT NOTE - FOLLOW UP  Pharmacy Consult for zosyn Indication: intraabdominal infection  No Known Allergies  Patient Measurements: Height: 5\' 6"  (167.6 cm) Weight: 132 lb 11.5 oz (60.2 kg) IBW/kg (Calculated) : 63.8   Vital Signs: Temp: 97.7 F (36.5 C) (10/16 0514) Temp Source: Oral (10/16 0514) BP: 99/66 mmHg (10/16 0514) Pulse Rate: 64 (10/16 0514) Intake/Output from previous day: 10/15 0701 - 10/16 0700 In: 1385 [I.V.:625; NG/GT:60; IV Piggyback:700] Out: 1325 [Urine:1325] Intake/Output from this shift:    Labs:  Recent Labs  07/06/14 0258 07/07/14 0651 07/08/14 0315  WBC  --  9.7 9.9  HGB  --  12.4* 11.3*  PLT  --  188 181  CREATININE 1.76* 1.30 1.19   Estimated Creatinine Clearance: 39.3 ml/min (by C-G formula based on Cr of 1.19). No results found for this basename: VANCOTROUGH, VANCOPEAK, VANCORANDOM, GENTTROUGH, GENTPEAK, GENTRANDOM, TOBRATROUGH, TOBRAPEAK, TOBRARND, AMIKACINPEAK, AMIKACINTROU, AMIKACIN,  in the last 72 hours   Microbiology: Recent Results (from the past 720 hour(s))  CULTURE, BLOOD (ROUTINE X 2)     Status: None   Collection Time    07/02/14  4:24 AM      Result Value Ref Range Status   Specimen Description BLOOD LEFT HAND   Final   Special Requests BOTTLES DRAWN AEROBIC AND ANAEROBIC [place]   Final   Culture  Setup Time     Final   Value: 07/02/2014 11:05     Performed at [facility]   Culture     Final   Value:        BLOOD CULTURE RECEIVED NO GROWTH TO DATE CULTURE WILL BE HELD FOR 5 DAYS BEFORE ISSUING A FINAL NEGATIVE REPORT     Performed at [facility]   Report Status PENDING   Incomplete  CULTURE, BLOOD (ROUTINE X 2)     Status: None   Collection Time    07/02/14  4:31 AM      Result Value Ref Range Status   Specimen Description BLOOD RIGHT FOREARM   Final   Special Requests BOTTLES DRAWN AEROBIC AND ANAEROBIC 5CC EACH   Final   Culture  Setup Time     Final   Value: 07/02/2014 11:05   Performed at [facility]   Culture     Final   Value:        BLOOD CULTURE RECEIVED NO GROWTH TO DATE CULTURE WILL BE HELD FOR 5 DAYS BEFORE ISSUING A FINAL NEGATIVE REPORT     Performed at [facility]   Report Status PENDING   Incomplete  URINE CULTURE     Status: None   Collection Time    07/02/14 11:41 AM      Result Value Ref Range Status   Specimen Description URINE, RANDOM   Final   Special Requests NONE   Final   Culture  Setup Time     Final   Value: 07/02/2014 19:38     Performed at [facility]     Final   Value: NO GROWTH     Performed at [facility]   Culture     Final   Value: NO GROWTH     Performed at [facility]   Report Status 07/03/2014 FINAL   Final    Anti-infectives   Start     Dose/Rate Route Frequency Ordered Stop   07/02/14 1200  piperacillin-tazobactam (ZOSYN) IVPB 3.375 g  3.375 g 12.5 mL/hr over 240 Minutes Intravenous Every 8 hours 07/02/14 0421     07/02/14 0430  piperacillin-tazobactam (ZOSYN) IVPB 3.375 g  Status:  Discontinued     3.375 g 100 mL/hr over 30 Minutes Intravenous  Once 07/02/14 0421 07/02/14 1625   07/02/14 0400  fluconazole (DIFLUCAN) IVPB 200 mg  Status:  Discontinued     200 mg 100 mL/hr over 60 Minutes Intravenous Every 24 hours 07/02/14 0345 07/07/14 1313   07/02/14 0000  cefTRIAXone (ROCEPHIN) 1 g in dextrose 5 % 50 mL IVPB  Status:  Discontinued     1 g 100 mL/hr over 30 Minutes Intravenous  Once 07/01/14 2354 07/01/14 2357      Assessment: 80 yo man on zosyn r/o intraabdominal infection.  His NG output had decreased and TFs started. Remains afeb with normal WBC  Goal of Therapy:  Eradication of infection, appropriate dose for renal function  Plan:  Cont zosyn 3.375 gm IV q8 hours F/u renal function  [name]  07/08/2014,10:56 AM

## 2014-07-08 NOTE — Progress Notes (Signed)
Physical Therapy Treatment Patient Details Name: [name] MRN: [id number] DOB: Mar 19, 1928 Today's Date: 07/08/2014    History of Present Illness Pt is an 80 y/o male admitted with an abnormal GI x-ray. Pt had an EGD 07/06/14 and was diagnosed with acute gastric distention, gastric pneumatosis, gastroparesis, and a motility disorder.    PT Comments    Pt progressing slowly towards physical therapy goals. Pt required +2 assist for mobility this session. Per RN and wife, pt has been very lethargic and drowsy since yesterday. Pt had difficulty arousing to participate in the session. Feel that pt will require increased assist at d/c, and will benefit from post-acute rehab in the SNF setting prior to returning home.   Follow Up Recommendations  SNF;Supervision/Assistance - 24 hour     Equipment Recommendations  None recommended by PT    Recommendations for Other Services       Precautions / Restrictions Precautions Precautions: Fall Restrictions Weight Bearing Restrictions: No    Mobility  Bed Mobility Overal bed mobility: Needs Assistance Bed Mobility: Rolling;Sidelying to Sit Rolling: Mod assist Sidelying to sit: Mod assist;+2 for physical assistance       General bed mobility comments: Frequent cues for technique. Assist for elevation of trunk into full sitting position.   Transfers Overall transfer level: Needs assistance Equipment used: 2 person hand held assist Transfers: Sit to/from [employer] Sit to Stand: Mod assist;+2 physical assistance   Squat pivot transfers: Mod assist;+2 physical assistance     General transfer comment: VC's for hand placement on seated surface for safety. Face-to-face transfer bed to chair. Unable to assist with LUE due to weakness.  Ambulation/Gait             General Gait Details: Deferred due to pt fatigue.    Stairs            Wheelchair Mobility    Modified Rankin (Stroke Patients Only)        Balance Overall balance assessment: Needs assistance Sitting-balance support: Feet supported;Single extremity supported Sitting balance-Leahy Scale: Poor   Postural control: Posterior lean;Right lateral lean Standing balance support: Single extremity supported;During functional activity Standing balance-Leahy Scale: Zero Standing balance comment: +2 assist required                    Cognition Arousal/Alertness: Awake/alert Behavior During Therapy: WFL for tasks assessed/performed Overall Cognitive Status: Within Functional Limits for tasks assessed                      Exercises      General Comments        Pertinent Vitals/Pain Pain Assessment: Faces Faces Pain Scale: Hurts even more Pain Location: L arm, R leg with movement Pain Intervention(s): Limited activity within patient's tolerance;Monitored during session    Home Living                      Prior Function            PT Goals (current goals can now be found in the care plan section) Acute Rehab PT Goals Patient Stated Goal: Return home PT Goal Formulation: With patient/family Time For Goal Achievement: 07/20/14 Potential to Achieve Goals: Good Progress towards PT goals: Progressing toward goals    Frequency  Min 2X/week    PT Plan Discharge plan needs to be updated;Frequency needs to be updated    Co-evaluation  End of Session Equipment Utilized During Treatment: Gait belt;Oxygen Activity Tolerance: Patient limited by lethargy;Patient limited by fatigue Patient left: in chair;with call bell/phone within reach;with family/visitor present     Time: 1216-2446 PT Time Calculation (min): 28 min  Charges:  $Gait Training: 8-22 mins $Therapeutic Activity: 8-22 mins                    G Codes:      Rolinda Roan 07/27/14, 12:54 PM  Rolinda Roan, PT, DPT Acute Rehabilitation Services Pager: [phone]

## 2014-07-08 NOTE — Progress Notes (Signed)
[facility] PROGRESS NOTE  [name] TIW:[id number] DOB: 12-02-1927 DOA: 07/01/2014 PCP: [name], MD   brief narrative 80 y.o. male with past medical history of Dysrhythmia; Hypertension; Shortness of breath; Claudication of calf muscles (11/05/2012); GERD (gastroesophageal reflux disease); TIA (transient ischemic attack) (2010); Peripheral vascular disease; On home oxygen therapy; Neuromuscular disorder; Macular degeneration, bilateral; Coronary artery disease; COPD on home o2; Type II diabetes mellitus; hx if Stroke (05/2013); Arthritis; and Pneumonia. Presented with 2 day hx of nausea vomiting and diarrhea. CT abdomen and pelvis on admission showed significant distended stomach filled with air and fluid with decompression of pylorus and first segment of duodenum suggestive of diffuse gastric emphysema. Patient admitted to medicine and NG placed with high output.   Assessment/Plan: Gastric emphysema// Gastric dysmotility:  -Gastric output at 450 yesterday, today at 200. -Clamp NG tube, start tube feeding, appreciate Dr Cristina Gong help.   -Keep NPO. Empiric zosyn  Day 6 and received 6 fluconazole. Will stop fluconazole.  -Holding po meds. IV fluids and Pain control with prn morphine.  -On schedule Reglan for gastric motility -Surgery sign off no indication for surgery and GI following. No signs of obstruction on imaging. -Endoscopy: was normal.  -Continue with Protonix, GI prophylaxis.   -day 2 tube feedings. If continue to tolerates might try to remove N G tube and oral trial.   Gastroenteritis Supportive care with fluids, antiemetics and pain control No further diarrhea since admission.   Acute renal failure:  Appears to be prerenal with severe dehydration secondary to high gastric output Continue with IV fluids.  -Monitor in a.m. -Cr peak to 2.1. Cr trending down 1.3  Hypernatremia; resolved.  Secondary to dehydration Change fluids to D 5. Repeat labs in am.  Decrease  IV fluids.  Na 157---150.   Afib on low dose scheduled IV lopressor. Po Coreg and ASA  on hold  COPD Prn nebs  Recurrent left pleural effusion Chronic , followed by thoracic surgery requiring thoracentesis. Non malignant effusion.     HTN  off home po meds. Iv lopressor and prn hydralazine  DM Hold metformin. SSI (sensitive )  Hypokalemia Replenish with KCl   DVT prophylaxis SQ heparin  Diet: N.p.o.    Code Status: change to partial Code, discussed with patient. He wants his wife to make decisions. Per wife no intubation.  Family Communication: updated wife 10-16.  Disposition Plan: continue inpt monitoring   Consultants:  [facility]  [facility]  Procedures:  none  Antibiotics:  IV zosyn 10/10--   IV fluconazole 10/10--  HPI/Subjective: He has been sleepy. He wake up and answer some questions. Denies pain, wants to eat,   Objective: Filed Vitals:   07/08/14 1335  BP: 114/70  Pulse: 66  Temp: 97.8 F (36.6 C)  Resp: 20    Intake/Output Summary (Last 24 hours) at 07/08/14 1806 Last data filed at 07/08/14 1400  Gross per 24 hour  Intake    110 ml  Output   1125 ml  Net  -1015 ml   Filed Weights   07/06/14 0438 07/07/14 0503 07/08/14 0514  Weight: 55.8 kg (123 lb 0.3 oz) 60.2 kg (132 lb 11.5 oz) 60.2 kg (132 lb 11.5 oz)    Exam:   General:  Elderly male in NAD  HEENT: NG in place with high output, dry mucosa  Cardiovascular: S1&S2 irregular, no murmurs  Respiratory: clear b/l  Abdomen: soft,  Non dnstended, no tenderness today, bowel sounds present,    Musculoskeletal: warm,  no edema   Data Reviewed: Basic Metabolic Panel:  Recent Labs Lab 07/01/14 2219  07/02/14 0405 07/04/14 1020 07/05/14 0421 07/06/14 0258 07/07/14 0651 07/08/14 0315  NA 149*  < > 147 153* 157* 156* 150* 145  K 3.5*  < > 3.8 3.3* 3.4* 3.6* 3.3* 3.6*  CL 97  < > 96 98 101 101 101 101  CO2 32  --  25 38* 36* 38* 36* 33*  GLUCOSE 150*  < > 180* 139* 120* 88  162* 163*  BUN 24*  < > 27* 66* 69* 63* 44* 37*  CREATININE 1.06  < > 1.08 2.12* 2.09* 1.76* 1.30 1.19  CALCIUM 9.4  --  9.0 8.5 8.7 9.0 8.8 8.5  MG 1.8  --  1.6  --   --   --   --   --   PHOS  --   --  5.9*  --   --   --   --   --   < > = values in this interval not displayed. Liver Function Tests:  Recent Labs Lab 07/01/14 2219 07/02/14 0405 07/05/14 0421  AST 45* 41* 23  ALT 30 27 16   ALKPHOS 124* 113 74  BILITOT 0.7 0.5 0.6  PROT 7.9 7.0 6.6  ALBUMIN 3.4* 3.0* 2.6*   No results found for this basename: LIPASE, AMYLASE,  in the last 168 hours No results found for this basename: AMMONIA,  in the last 168 hours CBC:  Recent Labs Lab 07/01/14 2219 07/01/14 2233 07/02/14 0405 07/05/14 0421 07/07/14 0651 07/08/14 0315  WBC 7.3  --  7.2 9.1 9.7 9.9  NEUTROABS 5.1  --   --  5.7  --   --   HGB 11.5* 12.2* 11.6* 12.7* 12.4* 11.3*  HCT 34.0* 36.0* 34.9* 40.1 38.7* 35.4*  MCV 95.0  --  97.5 101.8* 99.5 99.7  PLT 281  --  241 206 188 181   Cardiac Enzymes: No results found for this basename: CKTOTAL, CKMB, CKMBINDEX, TROPONINI,  in the last 168 hours BNP (last 3 results) No results found for this basename: PROBNP,  in the last 8760 hours CBG:  Recent Labs Lab 07/07/14 2345 07/08/14 0515 07/08/14 0759 07/08/14 1207 07/08/14 1601  GLUCAP 233* 200* 232* 183* 229*    Recent Results (from the past 240 hour(s))  CULTURE, BLOOD (ROUTINE X 2)     Status: None   Collection Time    07/02/14  4:24 AM      Result Value Ref Range Status   Specimen Description BLOOD LEFT HAND   Final   Special Requests BOTTLES DRAWN AEROBIC AND ANAEROBIC [place]   Final   Culture  Setup Time     Final   Value: 07/02/2014 11:05     Performed at [facility]   Culture     Final   Value: NO GROWTH 5 DAYS     Performed at [facility]   Report Status 07/08/2014 FINAL   Final  CULTURE, BLOOD (ROUTINE X 2)     Status: None   Collection Time    07/02/14  4:31 AM      Result  Value Ref Range Status   Specimen Description BLOOD RIGHT FOREARM   Final   Special Requests BOTTLES DRAWN AEROBIC AND ANAEROBIC [facility] EACH   Final   Culture  Setup Time     Final   Value: 07/02/2014 11:05     Performed at [facility]  Culture     Final   Value: NO GROWTH 5 DAYS     Performed at [facility]   Report Status 07/08/2014 FINAL   Final  URINE CULTURE     Status: None   Collection Time    07/02/14 11:41 AM      Result Value Ref Range Status   Specimen Description URINE, RANDOM   Final   Special Requests NONE   Final   Culture  Setup Time     Final   Value: 07/02/2014 19:38     Performed at [place]     Final   Value: NO GROWTH     Performed at [facility]   Culture     Final   Value: NO GROWTH     Performed at [facility]   Report Status 07/03/2014 FINAL   Final     Studies: No results found.  Scheduled Meds: . antiseptic oral rinse  7 mL Mouth Rinse BID  . antiseptic oral rinse  7 mL Mouth Rinse q12n4p  . chlorhexidine  15 mL Mouth Rinse BID  . feeding supplement (PRO-STAT SUGAR FREE 64)  30 mL Per Tube Q24H  . heparin subcutaneous  5,000 Units Subcutaneous 3 times per day  . insulin aspart  0-9 Units Subcutaneous 6 times per day  . metoCLOPramide (REGLAN) injection  10 mg Intravenous 4 times per day  . metoprolol  2.5 mg Intravenous 3 times per day  . pantoprazole (PROTONIX) IV  40 mg Intravenous Q12H  . piperacillin-tazobactam (ZOSYN)  IV  3.375 g Intravenous Q8H  . potassium chloride  10 mEq Intravenous Q1 Hr x 3  . sodium chloride  3 mL Intravenous Q12H  . tiotropium  18 mcg Inhalation Daily   Continuous Infusions: . dextrose 1,000 mL (07/08/14 7588)  . feeding supplement (JEVITY 1.2 CAL) 1,000 mL (07/08/14 0830)      Time spent: 25 minutes    Aubry Tucholski A  [facility] Pager [phone]. If 7PM-7AM, please contact night-coverage at [email or web address], password  [facility] 07/08/2014, 6:06 PM  LOS: 7 days

## 2014-07-08 NOTE — Progress Notes (Signed)
NUTRITION FOLLOW UP  Intervention:   Continue Jevity 1.2 @ 60 ml/hr via NGT (goal rate of 60 ml/hr) 30 ml Pro-stat once daily. Tube feeding regimen provides 1828 kcal (100% of needs), 95 grams of protein, and 1166 ml of H2O.  When IV fluids are discontinued, add 120 ml free water flushes every 4 hours to provide and additional 720 ml daily and a total of 1886 ml per 24 hours.   Nutrition Dx:   1) Increased nutrient needs related to chronic illness as evidenced by estimated nutrition needs; ongoing  2) Inadequate oral intake related to inability to eat as evidenced by NPO status; ongoing  Goal:   Pt to meet >/= 90% of their estimated nutrition needs; ongoing  Monitor:   TF rate/tolerance, diet advancement/PO intake, weight trend, labs, I/O's  Assessment:   Pt with PMH of Dysrhythmia; HTN; SOB; Claudication of calf muscles; GERD; TIA ; Peripheral vascular disease; On home oxygen therapy; Neuromuscular disorder; Macular degeneration, bilateral; Coronary artery disease; COPD; T2DM; Stroke; Arthritis; and Pneumonia. Presented with 2 day hx of nausea vomiting and diarrhea.  10/16: Pt is now receiving tube feedings at goal rate- Jevity 1.2 infusing at 60 ml/hr. No residual noted in chart. RN checked residual at time of visit- 300 ml. Pt asleep. Weight stable from yesterday.  Labs: elevated glucose, low potassium, elevated BUN, decreased GFR. I/O's: +5.8 L since adminssion   10/15: RD consulted for tube feeding management. Per MD note, NG output has decreased considerably and NG suction discontinued today with plans to start tube feeding. Pt asleep at time of visit with NGT in place and Jevity 1.2 infusing at 50 ml/hr. Per RN TF was just initiated will decrease to 20 ml/hr and advance more slowly to goal rate. Pt's weight has dropped and additional 4 lbs since 10/10.    Height: Ht Readings from Last 1 Encounters:  07/02/14 5\' 6"  (1.676 m)    Weight Status:   Wt Readings from Last 1  Encounters:  07/08/14 132 lb 11.5 oz (60.2 kg)    Re-estimated needs:  Kcal: 1700-1900  Protein: 90-100 gm  Fluid: 1.6-1.8 L  Skin: Stage I pressure ulcer on sacrum; non-pitting RLE and LLE edema  Diet Order: NPO   Intake/Output Summary (Last 24 hours) at 07/08/14 1424 Last data filed at 07/08/14 1258  Gross per 24 hour  Intake    110 ml  Output   1125 ml  Net  -1015 ml    Last BM: 10/15   Labs:   Recent Labs Lab 07/01/14 2219  07/02/14 0405  07/06/14 0258 07/07/14 0651 07/08/14 0315  NA 149*  < > 147  < > 156* 150* 145  K 3.5*  < > 3.8  < > 3.6* 3.3* 3.6*  CL 97  < > 96  < > 101 101 101  CO2 32  --  25  < > 38* 36* 33*  BUN 24*  < > 27*  < > 63* 44* 37*  CREATININE 1.06  < > 1.08  < > 1.76* 1.30 1.19  CALCIUM 9.4  --  9.0  < > 9.0 8.8 8.5  MG 1.8  --  1.6  --   --   --   --   PHOS  --   --  5.9*  --   --   --   --   GLUCOSE 150*  < > 180*  < > 88 162* 163*  < > = values in this  interval not displayed.  CBG (last 3)   Recent Labs  07/08/14 0515 07/08/14 0759 07/08/14 1207  GLUCAP 200* 232* 183*    Scheduled Meds: . antiseptic oral rinse  7 mL Mouth Rinse BID  . antiseptic oral rinse  7 mL Mouth Rinse q12n4p  . chlorhexidine  15 mL Mouth Rinse BID  . feeding supplement (PRO-STAT SUGAR FREE 64)  30 mL Per Tube Q24H  . heparin subcutaneous  5,000 Units Subcutaneous 3 times per day  . insulin aspart  0-9 Units Subcutaneous 6 times per day  . metoCLOPramide (REGLAN) injection  10 mg Intravenous 4 times per day  . metoprolol  2.5 mg Intravenous 3 times per day  . pantoprazole (PROTONIX) IV  40 mg Intravenous Q12H  . piperacillin-tazobactam (ZOSYN)  IV  3.375 g Intravenous Q8H  . sodium chloride  3 mL Intravenous Q12H  . tiotropium  18 mcg Inhalation Daily    Continuous Infusions: . dextrose 1,000 mL (07/08/14 6237)  . feeding supplement (JEVITY 1.2 CAL) 1,000 mL (07/08/14 0830)    Pryor Ochoa RD, LDN Inpatient Clinical Dietitian Pager:  [phone] After Hours Pager: [phone]

## 2014-07-09 LAB — GLUCOSE, CAPILLARY
GLUCOSE-CAPILLARY: 133 mg/dL — AB (ref 70–99)
GLUCOSE-CAPILLARY: 109 mg/dL — AB (ref 70–99)
Glucose-Capillary: 113 mg/dL — ABNORMAL HIGH (ref 70–99)
Glucose-Capillary: 141 mg/dL — ABNORMAL HIGH (ref 70–99)
Glucose-Capillary: 77 mg/dL (ref 70–99)
Glucose-Capillary: 87 mg/dL (ref 70–99)

## 2014-07-09 LAB — CBC
HCT: 35.9 % — ABNORMAL LOW (ref 39.0–52.0)
HEMOGLOBIN: 11.5 g/dL — AB (ref 13.0–17.0)
MCH: 31.9 pg (ref 26.0–34.0)
MCHC: 32 g/dL (ref 30.0–36.0)
MCV: 99.7 fL (ref 78.0–100.0)
Platelets: 174 10*3/uL (ref 150–400)
RBC: 3.6 MIL/uL — AB (ref 4.22–5.81)
RDW: 13.9 % (ref 11.5–15.5)
WBC: 10.6 10*3/uL — AB (ref 4.0–10.5)

## 2014-07-09 LAB — BASIC METABOLIC PANEL
ANION GAP: 11 (ref 5–15)
BUN: 32 mg/dL — ABNORMAL HIGH (ref 6–23)
CHLORIDE: 99 meq/L (ref 96–112)
CO2: 31 mEq/L (ref 19–32)
Calcium: 8.4 mg/dL (ref 8.4–10.5)
Creatinine, Ser: 1.13 mg/dL (ref 0.50–1.35)
GFR calc non Af Amer: 58 mL/min — ABNORMAL LOW (ref >90)
GFR, EST AFRICAN AMERICAN: 67 mL/min — AB (ref >90)
Glucose, Bld: 120 mg/dL — ABNORMAL HIGH (ref 70–99)
POTASSIUM: 4.1 meq/L (ref 3.7–5.3)
SODIUM: 141 meq/L (ref 137–147)

## 2014-07-09 NOTE — Progress Notes (Signed)
NG back to suction, w/ approx 1500 out over the past 20 hrs.  Abd flat, NT, no succussion splash.  IMPR:   Gastric dysmotility  PLAN:  Consider nasojejunal tube per IR on Monday.  Cleotis Nipper, M.D. [phone]

## 2014-07-09 NOTE — Progress Notes (Signed)
[facility] PROGRESS NOTE  [name] SWN:[id number] DOB: May 20, 1928 DOA: 07/01/2014 PCP: Curtis Gravel, MD   brief narrative 80 y.o. male with past medical history of Dysrhythmia; Hypertension; Shortness of breath; Claudication of calf muscles (11/05/2012); GERD (gastroesophageal reflux disease); TIA (transient ischemic attack) (2010); Peripheral vascular disease; On home oxygen therapy; Neuromuscular disorder; Macular degeneration, bilateral; Coronary artery disease; COPD on home o2; Type II diabetes mellitus; hx if Stroke (05/2013); Arthritis; and Pneumonia. Presented with 2 day hx of nausea vomiting and diarrhea. CT abdomen and pelvis on admission showed significant distended stomach filled with air and fluid with decompression of pylorus and first segment of duodenum suggestive of diffuse gastric emphysema. Patient admitted to medicine and NG placed with high output.   Assessment/Plan: Gastric emphysema// Gastric dysmotility:  -Gastric output at 450 yesterday, today at 200. -Clamp NG tube, start tube feeding, appreciate Dr Cristina Gong help.   -Keep NPO. Empiric zosyn  Day 6 and received 6 fluconazole. Will stop fluconazole.  -Holding po meds. IV fluids and Pain control with prn morphine.  -On schedule Reglan for gastric motility -Surgery sign off no indication for surgery and GI following. No signs of obstruction on imaging. -Endoscopy: was normal.  -Continue with Protonix, GI prophylaxis.   -received tube feeding for almost 2 days. Tube feedings stop, patient unable to tolerates it.  N-G tube Back to suctioning.     Acute renal failure:  Appears to be prerenal with severe dehydration secondary to high gastric output Continue with IV fluids.  -Monitor in a.m. -Cr peak to 2.1. Cr trending down 1.1  Hypernatremia; resolved.  Secondary to dehydration Change fluids to D 5. Repeat labs in am.  Decrease IV fluids.  Na 157---150---141  Afib on low dose scheduled IV lopressor. Po  Coreg and ASA  on hold  COPD Prn nebs  Recurrent left pleural effusion Chronic , followed by thoracic surgery requiring thoracentesis. Non malignant effusion.     HTN  off home po meds. Iv lopressor and prn hydralazine  DM Hold metformin. SSI (sensitive )  Hypokalemia Replenish with K-Cl  Gastroenteritis Supportive care with fluids, antiemetics and pain control No further diarrhea since admission.   DVT prophylaxis SQ heparin  Diet: N.p.o.    Code Status: change to partial Code, discussed with patient. He wants his wife to make decisions. Per wife no intubation.  Family Communication: updated wife 10-16.  Disposition Plan: continue inpt monitoring   Consultants:  [facility]  [facility]  Procedures:  none  Antibiotics:  IV zosyn 10/10--   IV fluconazole 10/10--  HPI/Subjective: Patient has been more awake today, feeling better.  No abdominal pain.   Objective: Filed Vitals:   07/09/14 1340  BP: 118/74  Pulse: 87  Temp: 97.5 F (36.4 C)  Resp: 20    Intake/Output Summary (Last 24 hours) at 07/09/14 1609 Last data filed at 07/09/14 1339  Gross per 24 hour  Intake 4786.42 ml  Output   1800 ml  Net 2986.42 ml   Filed Weights   07/07/14 0503 07/08/14 0514 07/09/14 0449  Weight: 60.2 kg (132 lb 11.5 oz) 60.2 kg (132 lb 11.5 oz) 58.6 kg (129 lb 3 oz)    Exam:   General:  Elderly male in NAD  HEENT: NG in place with high output, dry mucosa  Cardiovascular: S1&S2 irregular, no murmurs  Respiratory: clear b/l  Abdomen: soft,  Non dnstended, no tenderness today, bowel sounds present,    Musculoskeletal: warm, no edema   Data  Reviewed: Basic Metabolic Panel:  Recent Labs Lab 07/05/14 0421 07/06/14 0258 07/07/14 0651 07/08/14 0315 07/09/14 0409  NA 157* 156* 150* 145 141  K 3.4* 3.6* 3.3* 3.6* 4.1  CL 101 101 101 101 99  CO2 36* 38* 36* 33* 31  GLUCOSE 120* 88 162* 163* 120*  BUN 69* 63* 44* 37* 32*  CREATININE 2.09* 1.76* 1.30 1.19  1.13  CALCIUM 8.7 9.0 8.8 8.5 8.4   Liver Function Tests:  Recent Labs Lab 07/05/14 0421  AST 23  ALT 16  ALKPHOS 74  BILITOT 0.6  PROT 6.6  ALBUMIN 2.6*   No results found for this basename: LIPASE, AMYLASE,  in the last 168 hours No results found for this basename: AMMONIA,  in the last 168 hours CBC:  Recent Labs Lab 07/05/14 0421 07/07/14 0651 07/08/14 0315 07/09/14 0409  WBC 9.1 9.7 9.9 10.6*  NEUTROABS 5.7  --   --   --   HGB 12.7* 12.4* 11.3* 11.5*  HCT 40.1 38.7* 35.4* 35.9*  MCV 101.8* 99.5 99.7 99.7  PLT 206 188 181 174   Cardiac Enzymes: No results found for this basename: CKTOTAL, CKMB, CKMBINDEX, TROPONINI,  in the last 168 hours BNP (last 3 results) No results found for this basename: PROBNP,  in the last 8760 hours CBG:  Recent Labs Lab 07/08/14 1945 07/08/14 2328 07/09/14 0401 07/09/14 0813 07/09/14 1202  GLUCAP 181* 121* 133* 141* 77    Recent Results (from the past 240 hour(s))  CULTURE, BLOOD (ROUTINE X 2)     Status: None   Collection Time    07/02/14  4:24 AM      Result Value Ref Range Status   Specimen Description BLOOD LEFT HAND   Final   Special Requests BOTTLES DRAWN AEROBIC AND ANAEROBIC [name]   Final   Culture  Setup Time     Final   Value: 07/02/2014 11:05     Performed at [place]     Final   Value: NO GROWTH 5 DAYS     Performed at [facility]   Report Status 07/08/2014 FINAL   Final  CULTURE, BLOOD (ROUTINE X 2)     Status: None   Collection Time    07/02/14  4:31 AM      Result Value Ref Range Status   Specimen Description BLOOD RIGHT FOREARM   Final   Special Requests BOTTLES DRAWN AEROBIC AND ANAEROBIC 5CC EACH   Final   Culture  Setup Time     Final   Value: 07/02/2014 11:05     Performed at [place]     Final   Value: NO GROWTH 5 DAYS     Performed at [facility]   Report Status 07/08/2014 FINAL   Final  URINE CULTURE     Status: None    Collection Time    07/02/14 11:41 AM      Result Value Ref Range Status   Specimen Description URINE, RANDOM   Final   Special Requests NONE   Final   Culture  Setup Time     Final   Value: 07/02/2014 19:38     Performed at [place]     Final   Value: NO GROWTH     Performed at [facility]   Culture     Final   Value: NO GROWTH     Performed  at [facility]   Report Status 07/03/2014 FINAL   Final     Studies: No results found.  Scheduled Meds: . antiseptic oral rinse  7 mL Mouth Rinse BID  . antiseptic oral rinse  7 mL Mouth Rinse q12n4p  . chlorhexidine  15 mL Mouth Rinse BID  . feeding supplement (PRO-STAT SUGAR FREE 64)  30 mL Per Tube Q24H  . heparin subcutaneous  5,000 Units Subcutaneous 3 times per day  . insulin aspart  0-9 Units Subcutaneous 6 times per day  . metoCLOPramide (REGLAN) injection  10 mg Intravenous 4 times per day  . metoprolol  2.5 mg Intravenous 3 times per day  . pantoprazole (PROTONIX) IV  40 mg Intravenous Q12H  . piperacillin-tazobactam (ZOSYN)  IV  3.375 g Intravenous Q8H  . sodium chloride  3 mL Intravenous Q12H  . tiotropium  18 mcg Inhalation Daily   Continuous Infusions: . dextrose 50 mL/hr at 07/08/14 2225      Time spent: 25 minutes    [name], [facility] Pager [phone]. If 7PM-7AM, please contact night-coverage at [email or web address], password [facility] 07/09/2014, 4:09 PM  LOS: 8 days

## 2014-07-10 LAB — BASIC METABOLIC PANEL
Anion gap: 12 (ref 5–15)
BUN: 25 mg/dL — ABNORMAL HIGH (ref 6–23)
CHLORIDE: 96 meq/L (ref 96–112)
CO2: 29 mEq/L (ref 19–32)
CREATININE: 1.12 mg/dL (ref 0.50–1.35)
Calcium: 8.8 mg/dL (ref 8.4–10.5)
GFR calc non Af Amer: 58 mL/min — ABNORMAL LOW (ref >90)
GFR, EST AFRICAN AMERICAN: 68 mL/min — AB (ref >90)
Glucose, Bld: 133 mg/dL — ABNORMAL HIGH (ref 70–99)
Potassium: 4.1 mEq/L (ref 3.7–5.3)
Sodium: 137 mEq/L (ref 137–147)

## 2014-07-10 LAB — GLUCOSE, CAPILLARY
GLUCOSE-CAPILLARY: 92 mg/dL (ref 70–99)
GLUCOSE-CAPILLARY: 37 mg/dL — AB (ref 70–99)
GLUCOSE-CAPILLARY: 56 mg/dL — AB (ref 70–99)
GLUCOSE-CAPILLARY: 164 mg/dL — AB (ref 70–99)
GLUCOSE-CAPILLARY: 192 mg/dL — AB (ref 70–99)
Glucose-Capillary: 138 mg/dL — ABNORMAL HIGH (ref 70–99)
Glucose-Capillary: 112 mg/dL — ABNORMAL HIGH (ref 70–99)
Glucose-Capillary: 37 mg/dL — CL (ref 70–99)
Glucose-Capillary: 42 mg/dL — CL (ref 70–99)
Glucose-Capillary: 75 mg/dL (ref 70–99)
Glucose-Capillary: 87 mg/dL (ref 70–99)

## 2014-07-10 LAB — CBC
HEMATOCRIT: 35.9 % — AB (ref 39.0–52.0)
HEMOGLOBIN: 11.5 g/dL — AB (ref 13.0–17.0)
MCH: 32.5 pg (ref 26.0–34.0)
MCHC: 32 g/dL (ref 30.0–36.0)
MCV: 101.4 fL — ABNORMAL HIGH (ref 78.0–100.0)
Platelets: 192 10*3/uL (ref 150–400)
RBC: 3.54 MIL/uL — ABNORMAL LOW (ref 4.22–5.81)
RDW: 13.9 % (ref 11.5–15.5)
WBC: 8.9 10*3/uL (ref 4.0–10.5)

## 2014-07-10 MED ORDER — GLUCAGON HCL RDNA (DIAGNOSTIC) 1 MG IJ SOLR
INTRAMUSCULAR | Status: AC
  Administered 2014-07-10: 17:00:00
  Filled 2014-07-10: qty 1

## 2014-07-10 MED ORDER — DEXTROSE 10 % IV SOLN: INTRAVENOUS | Status: DC

## 2014-07-10 MED ORDER — GLUCOSE 40 % PO GEL
ORAL | Status: AC
  Administered 2014-07-10: 1 via ORAL
  Filled 2014-07-10: qty 1

## 2014-07-10 MED ORDER — GLUCOSE 40 % PO GEL: 1.0000 | Freq: Once | ORAL | Status: AC

## 2014-07-10 NOTE — Progress Notes (Signed)
NG output not recorded, but large volume of fluid is present in the suction canister.  On exam, the patient is in no distress, but appears chronically ill and debilitated. The abdomen is nondistended. No succussion splash. Sparse bowel sounds present.  Labs show persistently normal electrolytes including potassium.  I think it is time to have a jejunal feeding tube placed by interventional radiology. It remains unclear whether the patient will be able to get by without NG suction; he may end up having to have a small caliber NG tube placed alongside his jejunal feeding tube, if he develops abdominal distention, epigastric pain severe nausea, or vomiting. However, I would probably try him without NG suction initially.  Cleotis Nipper, M.D. [phone]

## 2014-07-10 NOTE — Clinical Social Work Note (Signed)
CSW continues to follow patient for d/c planning needs. CSW attempted to contact patient's wife Mable, as no one was present at bedside. CSW left message for wife to follow-up. CSW to follow tomorrow.  [place], [place] Weekend Clinical Social Worker [phone]

## 2014-07-10 NOTE — Progress Notes (Signed)
[facility] PROGRESS NOTE  [name] GUR:[id number] DOB: 1928/05/04 DOA: 07/01/2014 PCP: [name], MD   brief narrative 80 y.o. male with past medical history of Dysrhythmia; Hypertension; Shortness of breath; Claudication of calf muscles (11/05/2012); GERD (gastroesophageal reflux disease); TIA (transient ischemic attack) (2010); Peripheral vascular disease; On home oxygen therapy; Neuromuscular disorder; Macular degeneration, bilateral; Coronary artery disease; COPD on home o2; Type II diabetes mellitus; hx if Stroke (05/2013); Arthritis; and Pneumonia. Presented with 2 day hx of nausea vomiting and diarrhea. CT abdomen and pelvis on admission showed significant distended stomach filled with air and fluid with decompression of pylorus and first segment of duodenum suggestive of diffuse gastric emphysema. Patient admitted to medicine and NG placed with high output.   Assessment/Plan: Gastric emphysema// Gastric dysmotility:  -Gastric output at 450 yesterday, today at 200. -Clamp NG tube, start tube feeding, appreciate Dr Cristina Gong help.   -Keep NPO. Empiric zosyn  Day 8 and received 6 fluconazole. Will stop fluconazole. Will stop zosyn.  -Holding po meds. IV fluids and Pain control with prn morphine.  -On schedule Reglan for gastric motility -Surgery sign off no indication for surgery and GI following. No signs of obstruction on imaging. -Endoscopy: was normal.  -Continue with Protonix, GI prophylaxis.   -received tube feeding for almost 2 days. Tube feedings stop, patient unable to tolerates it.  -N-G tube Back to suctioning.  -Plan for Nasojejunal tube by IR.   Acute renal failure:  Appears to be prerenal with severe dehydration secondary to high gastric output Continue with IV fluids.  -Monitor in a.m. -Cr peak to 2.1. Cr trending down 1.1  Hypernatremia; resolved.  Secondary to dehydration Change fluids to D 5. Repeat labs in am.  Decrease IV fluids.  Na  157---150---141  Afib on low dose scheduled IV lopressor. Po Coreg and ASA  on hold  COPD Prn nebs  Recurrent left pleural effusion Chronic , followed by thoracic surgery requiring thoracentesis. Non malignant effusion.     HTN  off home po meds. Iv lopressor and prn hydralazine  DM Hold metformin. SSI (sensitive )  Hypokalemia Replenish with K-Cl  Gastroenteritis Supportive care with fluids, antiemetics and pain control No further diarrhea since admission.   DVT prophylaxis SQ heparin  Diet: N.p.o.    Code Status: change to partial Code, discussed with patient. He wants his wife to make decisions. Per wife no intubation.  Family Communication: updated wife 49-18.  Disposition Plan: continue inpt monitoring   Consultants:  [facility]  [facility]  Procedures:  none  Antibiotics:  IV zosyn 10/10--10-18   IV fluconazole 10/10--07-03-15  HPI/Subjective: He is alert, answering questions, speech more clear. Denies abdominal pain.   Objective: Filed Vitals:   07/10/14 0413  BP: 130/77  Pulse: 77  Temp: 97 F (36.1 C)  Resp: 18    Intake/Output Summary (Last 24 hours) at 07/10/14 1354 Last data filed at 07/10/14 0848  Gross per 24 hour  Intake      0 ml  Output    300 ml  Net   -300 ml   Filed Weights   07/08/14 0514 07/09/14 0449 07/10/14 0413  Weight: 60.2 kg (132 lb 11.5 oz) 58.6 kg (129 lb 3 oz) 57.4 kg (126 lb 8.7 oz)    Exam:   General:  Elderly male in NAD  HEENT: NG in place with high output, dry mucosa  Cardiovascular: S1&S2 irregular, no murmurs  Respiratory: clear b/l  Abdomen: soft,  Non dnstended, no  tenderness today, bowel sounds present,    Musculoskeletal: warm, no edema   Data Reviewed: Basic Metabolic Panel:  Recent Labs Lab 07/06/14 0258 07/07/14 0651 07/08/14 0315 07/09/14 0409 07/10/14 0403  NA 156* 150* 145 141 137  K 3.6* 3.3* 3.6* 4.1 4.1  CL 101 101 101 99 96  CO2 38* 36* 33* 31 29  GLUCOSE 88 162* 163*  120* 133*  BUN 63* 44* 37* 32* 25*  CREATININE 1.76* 1.30 1.19 1.13 1.12  CALCIUM 9.0 8.8 8.5 8.4 8.8   Liver Function Tests:  Recent Labs Lab 07/05/14 0421  AST 23  ALT 16  ALKPHOS 74  BILITOT 0.6  PROT 6.6  ALBUMIN 2.6*   No results found for this basename: LIPASE, AMYLASE,  in the last 168 hours No results found for this basename: AMMONIA,  in the last 168 hours CBC:  Recent Labs Lab 07/05/14 0421 07/07/14 0651 07/08/14 0315 07/09/14 0409 07/10/14 0403  WBC 9.1 9.7 9.9 10.6* 8.9  NEUTROABS 5.7  --   --   --   --   HGB 12.7* 12.4* 11.3* 11.5* 11.5*  HCT 40.1 38.7* 35.4* 35.9* 35.9*  MCV 101.8* 99.5 99.7 99.7 101.4*  PLT 206 188 181 174 192   Cardiac Enzymes: No results found for this basename: CKTOTAL, CKMB, CKMBINDEX, TROPONINI,  in the last 168 hours BNP (last 3 results) No results found for this basename: PROBNP,  in the last 8760 hours CBG:  Recent Labs Lab 07/09/14 1959 07/09/14 2341 07/10/14 0358 07/10/14 0857 07/10/14 1201  GLUCAP 113* 87 138* 112* 92    Recent Results (from the past 240 hour(s))  CULTURE, BLOOD (ROUTINE X 2)     Status: None   Collection Time    07/02/14  4:24 AM      Result Value Ref Range Status   Specimen Description BLOOD LEFT HAND   Final   Special Requests BOTTLES DRAWN AEROBIC AND ANAEROBIC [place]   Final   Culture  Setup Time     Final   Value: 07/02/2014 11:05     Performed at [place]     Final   Value: NO GROWTH 5 DAYS     Performed at [facility]   Report Status 07/08/2014 FINAL   Final  CULTURE, BLOOD (ROUTINE X 2)     Status: None   Collection Time    07/02/14  4:31 AM      Result Value Ref Range Status   Specimen Description BLOOD RIGHT FOREARM   Final   Special Requests BOTTLES DRAWN AEROBIC AND ANAEROBIC 5CC EACH   Final   Culture  Setup Time     Final   Value: 07/02/2014 11:05     Performed at [place]     Final   Value: NO GROWTH 5 DAYS      Performed at [facility]   Report Status 07/08/2014 FINAL   Final  URINE CULTURE     Status: None   Collection Time    07/02/14 11:41 AM      Result Value Ref Range Status   Specimen Description URINE, RANDOM   Final   Special Requests NONE   Final   Culture  Setup Time     Final   Value: 07/02/2014 19:38     Performed at [place]     Final   Value: NO GROWTH  Performed at [facility]     Final   Value: NO GROWTH     Performed at [facility]   Report Status 07/03/2014 FINAL   Final     Studies: No results found.  Scheduled Meds: . antiseptic oral rinse  7 mL Mouth Rinse BID  . antiseptic oral rinse  7 mL Mouth Rinse q12n4p  . chlorhexidine  15 mL Mouth Rinse BID  . feeding supplement (PRO-STAT SUGAR FREE 64)  30 mL Per Tube Q24H  . heparin subcutaneous  5,000 Units Subcutaneous 3 times per day  . insulin aspart  0-9 Units Subcutaneous 6 times per day  . metoCLOPramide (REGLAN) injection  10 mg Intravenous 4 times per day  . metoprolol  2.5 mg Intravenous 3 times per day  . pantoprazole (PROTONIX) IV  40 mg Intravenous Q12H  . piperacillin-tazobactam (ZOSYN)  IV  3.375 g Intravenous Q8H  . sodium chloride  3 mL Intravenous Q12H  . tiotropium  18 mcg Inhalation Daily   Continuous Infusions: . dextrose 1,000 mL (07/09/14 1816)      Time spent: 25 minutes    Kalista Laguardia, [facility] Pager [phone]. If 7PM-7AM, please contact night-coverage at [email or web address], password [facility] 07/10/2014, 1:54 PM  LOS: 9 days

## 2014-07-10 NOTE — Significant Event (Signed)
Cbg at 4, patient not symptomatic  ,GlucaGen ( GlucaGen substituted for D 50% amp  per pharmacy ) given IV per unit protocol. Dr Tyrell Antonio called. 1700- CBG at 37, patient continues to be nonsymptomatic A&OX4. NGT suction stop Glutose15 oral glucose gel given . D5 IV rate increased to 100 ml /hr as ordered. 1725- CBG AT 57 . 1745- CBG AT 74 patient remains nonsymptomatic A&OX3 wife at bedside . Will continue to monitor patient .

## 2014-07-11 ENCOUNTER — Inpatient Hospital Stay (HOSPITAL_COMMUNITY): Payer: Medicare Other

## 2014-07-11 LAB — GLUCOSE, CAPILLARY
GLUCOSE-CAPILLARY: 131 mg/dL — AB (ref 70–99)
GLUCOSE-CAPILLARY: 167 mg/dL — AB (ref 70–99)
GLUCOSE-CAPILLARY: 184 mg/dL — AB (ref 70–99)
GLUCOSE-CAPILLARY: 48 mg/dL — AB (ref 70–99)
GLUCOSE-CAPILLARY: 86 mg/dL (ref 70–99)
Glucose-Capillary: 101 mg/dL — ABNORMAL HIGH (ref 70–99)
Glucose-Capillary: 134 mg/dL — ABNORMAL HIGH (ref 70–99)
Glucose-Capillary: 140 mg/dL — ABNORMAL HIGH (ref 70–99)
Glucose-Capillary: 161 mg/dL — ABNORMAL HIGH (ref 70–99)
Glucose-Capillary: 183 mg/dL — ABNORMAL HIGH (ref 70–99)
Glucose-Capillary: 185 mg/dL — ABNORMAL HIGH (ref 70–99)
Glucose-Capillary: 195 mg/dL — ABNORMAL HIGH (ref 70–99)
Glucose-Capillary: 199 mg/dL — ABNORMAL HIGH (ref 70–99)

## 2014-07-11 LAB — BASIC METABOLIC PANEL
Anion gap: 13 (ref 5–15)
BUN: 17 mg/dL (ref 6–23)
CALCIUM: 9.1 mg/dL (ref 8.4–10.5)
CO2: 26 meq/L (ref 19–32)
CREATININE: 0.98 mg/dL (ref 0.50–1.35)
Chloride: 93 mEq/L — ABNORMAL LOW (ref 96–112)
GFR calc Af Amer: 85 mL/min — ABNORMAL LOW (ref >90)
GFR, EST NON AFRICAN AMERICAN: 73 mL/min — AB (ref >90)
GLUCOSE: 168 mg/dL — AB (ref 70–99)
Potassium: 4 mEq/L (ref 3.7–5.3)
SODIUM: 132 meq/L — AB (ref 137–147)

## 2014-07-11 MED ORDER — DEXTROSE-NACL 5-0.45 % IV SOLN
INTRAVENOUS | Status: DC
  Administered 2014-07-11: 1000 mL via INTRAVENOUS
  Administered 2014-07-12 – 2014-07-13 (×3): via INTRAVENOUS

## 2014-07-11 MED ORDER — JEVITY 1.2 CAL PO LIQD
1000.0000 mL | ORAL | Status: DC
Start: 2014-07-11 — End: 2014-07-15
  Administered 2014-07-11 – 2014-07-13 (×2): 1000 mL
  Administered 2014-07-14: 13:00:00
  Filled 2014-07-11 (×8): qty 1000

## 2014-07-11 MED ORDER — DEXTROSE 250 MG/ML IV SOLN
0.5000 g/kg | Freq: Once | INTRAVENOUS | Status: AC
  Administered 2014-07-11: 28.4 g via INTRAVENOUS
  Filled 2014-07-11: qty 120

## 2014-07-11 MED ORDER — IOHEXOL 300 MG/ML  SOLN
50.0000 mL | Freq: Once | INTRAMUSCULAR | Status: AC | PRN
  Administered 2014-07-11: 30 mL

## 2014-07-11 MED ORDER — DEXTROSE 50 % IV SOLN
INTRAVENOUS | Status: AC
  Filled 2014-07-11: qty 50

## 2014-07-11 MED ORDER — JEVITY 1.2 CAL PO LIQD
1000.0000 mL | ORAL | Status: DC
  Administered 2014-07-11: 1000 mL
  Filled 2014-07-11: qty 1000

## 2014-07-11 MED ORDER — JEVITY 1.2 CAL PO LIQD
1000.0000 mL | ORAL | Status: DC
  Filled 2014-07-11: qty 1000

## 2014-07-11 NOTE — Progress Notes (Signed)
Physical Therapy Treatment Patient Details Name: [name] MRN: [id number] DOB: 1928-08-12 Today's Date: 07/11/2014    History of Present Illness Pt is an 80 y/o male admitted with an abnormal GI x-ray. Pt had an EGD 07/06/14 and was diagnosed with acute gastric distention, gastric pneumatosis, gastroparesis, and a motility disorder.    PT Comments    Pt with NG tube placement this AM, but upon entry was attempting to sit EOB to use urinal.  Pt is very weak and continue to recommend SNF for rehab.  Follow Up Recommendations  SNF;Supervision/Assistance - 24 hour     Equipment Recommendations  None recommended by PT    Recommendations for Other Services       Precautions / Restrictions Precautions Precautions: Fall    Mobility  Bed Mobility Overal bed mobility: Needs Assistance Bed Mobility: Supine to Sit     Supine to sit: Mod assist Sit to supine: Min assist   General bed mobility comments: cues for technique and safety  Transfers Overall transfer level: Needs assistance Equipment used: Hemi-walker Transfers: Sit to/from Stand Sit to Stand: Max assist         General transfer comment: cues for hand placement and stood from elevated surface.  Flexed knees and buckeling noted. 1 small side step to [facility] before sitting down.  Ambulation/Gait                 Stairs            Wheelchair Mobility    Modified Rankin (Stroke Patients Only)       Balance   Sitting-balance support: Feet supported;Single extremity supported Sitting balance-Leahy Scale: Poor Sitting balance - Comments: used urinal at EOB. Postural control: Posterior lean Standing balance support: Single extremity supported Standing balance-Leahy Scale: Zero Standing balance comment: buckeling of knees                    Cognition Arousal/Alertness: Awake/alert Behavior During Therapy: WFL for tasks assessed/performed Overall Cognitive Status: Within Functional  Limits for tasks assessed                      Exercises      General Comments        Pertinent Vitals/Pain Pain Assessment: No/denies pain    Home Living                      Prior Function            PT Goals (current goals can now be found in the care plan section) Acute Rehab PT Goals PT Goal Formulation: With patient/family Time For Goal Achievement: 07/20/14 Potential to Achieve Goals: Good Progress towards PT goals: Progressing toward goals    Frequency  Min 2X/week    PT Plan Current plan remains appropriate    Co-evaluation             End of Session Equipment Utilized During Treatment: Gait belt;Oxygen Activity Tolerance: Patient limited by fatigue;Patient limited by lethargy Patient left: in bed;with call bell/phone within reach;with bed alarm set     Time: 0200-0214 PT Time Calculation (min): 14 min  Charges:  $Therapeutic Activity: 8-22 mins                    G Codes:      [name] 07/11/2014, 2:21 PM

## 2014-07-11 NOTE — Progress Notes (Signed)
Pt BS 44mg /dl.  Pt asymptomatic.  Dr Tyrell Antonio notified and instructed to give IV D50 per protocol.  Will continue to monitor

## 2014-07-11 NOTE — Progress Notes (Signed)
UR completed [place] Decoursey K. [place] Luppino, RN, BSN, MSHL, CCM  07/11/2014 10:45 AM

## 2014-07-11 NOTE — Progress Notes (Signed)
NUTRITION FOLLOW UP/CONSULT  Intervention:   Continue Jevity 1.2 @ 20 ml/hr via panda NJ tube and increase by 10 ml/hr every 8 hours to goal rate of 60 ml/hr 30 ml Pro-stat once daily. Tube feeding regimen provides 1828 kcal (100% of needs), 95 grams of protein, and 1166 ml of H2O.   When IV fluids are discontinued, add 90 ml free water flushes every 3 hours to provide and additional 720 ml daily and a total of 1886 ml per 24 hours.   Nutrition Dx:   1) Increased nutrient needs related to chronic illness as evidenced by estimated nutrition needs; ongoing  2) Inadequate oral intake related to inability to eat as evidenced by NPO status; ongoing  Goal:   Pt to meet >/= 90% of their estimated nutrition needs; ongoing  Monitor:   TF rate/tolerance, diet advancement/PO intake, weight trend, labs, I/O's  Assessment:   Pt with PMH of Dysrhythmia; HTN; SOB; Claudication of calf muscles; GERD; TIA ; Peripheral vascular disease; On home oxygen therapy; Neuromuscular disorder; Macular degeneration, bilateral; Coronary artery disease; COPD; T2DM; Stroke; Arthritis; and Pneumonia. Presented with 2 day hx of nausea vomiting and diarrhea.  10/19: RD consulted for TF management. Per chart patient had a residual over 800 ml Friday evening. Patient failed trial of intragastric feedings through his NG tube, so IR has now placed a Panda tube into the small bowel for jejunal tube feeds.  Per MD, for time being, plan to continue simultaneous gastric decompression through his coexisting nasogastric tube.  RN has re-started tube feedings with Jevity 1.2 infusing at 20 ml/hr via panda NJ tube.  Pt's weight continues to trend down.  Labs: low sodium, low chloride, elevated glucose +7.1 L net since admission   10/16: Pt is now receiving tube feedings at goal rate- Jevity 1.2 infusing at 60 ml/hr. No residual noted in chart. RN checked residual at time of visit- 300 ml. Pt asleep. Weight stable from yesterday.   Labs: elevated glucose, low potassium, elevated BUN, decreased GFR. I/O's: +5.8 L since adminssion  10/15: RD consulted for tube feeding management. Per MD note, NG output has decreased considerably and NG suction discontinued today with plans to start tube feeding. Pt asleep at time of visit with NGT in place and Jevity 1.2 infusing at 50 ml/hr. Per RN TF was just initiated will decrease to 20 ml/hr and advance more slowly to goal rate. Pt's weight has dropped and additional 4 lbs since 10/10.    Height: Ht Readings from Last 1 Encounters:  07/02/14 5\' 6"  (1.676 m)    Weight Status:   Wt Readings from Last 1 Encounters:  07/11/14 125 lb 4.6 oz (56.83 kg)  07/08/14 132 lb 07/02/14 138 lb  Re-estimated needs:  Kcal: 1700-1900  Protein: 90-100 gm  Fluid: 1.6-1.8 L  Skin: Stage I pressure ulcer on sacrum; non-pitting RLE and LLE edema  Diet Order: NPO   Intake/Output Summary (Last 24 hours) at 07/11/14 1746 Last data filed at 07/11/14 1433  Gross per 24 hour  Intake      0 ml  Output    800 ml  Net   -800 ml    Last BM: 10/15   Labs:   Recent Labs Lab 07/09/14 0409 07/10/14 0403 07/11/14 1120  NA 141 137 132*  K 4.1 4.1 4.0  CL 99 96 93*  CO2 31 29 26   BUN 32* 25* 17  CREATININE 1.13 1.12 0.98  CALCIUM 8.4 8.8 9.1  GLUCOSE 120*  133* 168*    CBG (last 3)   Recent Labs  07/11/14 1516 07/11/14 1639 07/11/14 1730  GLUCAP 131* 48* 161*    Scheduled Meds: . antiseptic oral rinse  7 mL Mouth Rinse BID  . antiseptic oral rinse  7 mL Mouth Rinse q12n4p  . chlorhexidine  15 mL Mouth Rinse BID  . dextrose  0.5 g/kg Intravenous Once  . dextrose      . feeding supplement (PRO-STAT SUGAR FREE 64)  30 mL Per Tube Q24H  . heparin subcutaneous  5,000 Units Subcutaneous 3 times per day  . metoCLOPramide (REGLAN) injection  10 mg Intravenous 4 times per day  . metoprolol  2.5 mg Intravenous 3 times per day  . pantoprazole (PROTONIX) IV  40 mg Intravenous Q12H   . sodium chloride  3 mL Intravenous Q12H  . tiotropium  18 mcg Inhalation Daily    Continuous Infusions: . dextrose 5 % and 0.45% NaCl    . feeding supplement (JEVITY 1.2 CAL) 1,000 mL (07/11/14 1536)    Pryor Ochoa RD, LDN Inpatient Clinical Dietitian Pager: [phone] After Hours Pager: [phone]

## 2014-07-11 NOTE — Progress Notes (Signed)
I had a chance to explain to the patient's wife, [name], at the bedside this morning our strategy to try jejunal tube feedings in this patient.   He is scheduled for that later today, and I have spoken with a radiology technician.   In a change from my previous plan, in view of moderate residuals overnight, we will leave the NG tube in place (she indicates that they can place the jejunal feeding tube alongside the NG tube) so that we have ready access to assessment of gastric residuals and ongoing gastric decompression if needed.  I will request a dietitian consult to assist with jejunal tube feeding orders.  Cleotis Nipper, M.D. [phone]

## 2014-07-11 NOTE — Clinical Social Work Psychosocial (Signed)
     Clinical Social Work Department BRIEF PSYCHOSOCIAL ASSESSMENT 07/11/2014  Patient:  [name], [name]     Account Number:  [id number]     Admit date:  07/01/2014  Clinical Social Worker:  Elam Dutch  Date/Time:  07/11/2014 04:20 PM  Referred by:  Physician  Date Referred:  07/10/2014 Referred for  SNF Placement   Other Referral:   Interview type:  Patient Other interview type:    PSYCHOSOCIAL DATA Living Status:  WIFE Admitted from facility:   Level of care:   Primary support name:  [name]    [phone] Primary support relationship to patient:  SPOUSE Degree of support available:   Strong support per patient  Daughter:  [name]  daughter  [phone]    CURRENT CONCERNS Current Concerns  Post-Acute Placement   Other Concerns:    SOCIAL WORK ASSESSMENT / PLAN 80 year old male admitted from home where he lives with his wife [name].  Patient states that he has managed at home in the past but agrees that he has become very weak since hospitalization.  PT recommends short term SNF and patient is agreeable.  He currently has an NGT for suctioning and a Panda Tube was placed this afternoon. CSW briefly discussed short term rehab recognition that he is not medically stable for d/c.  Fl2 initaited and placed on chart for MD's signature.  Will require updating once patient is more stable.  Patient stated that his wife helps him to make decisions and requested that she be contacted as well.   Assessment/plan status:  Psychosocial Support/Ongoing Assessment of Needs Other assessment/ plan:   Information/referral to community resources:   SNF list provided to patient for review    PATIENTS/FAMILYS RESPONSE TO PLAN OF CARE: Patient was noted to be lying in bed- alert and oriented but states that he is feeling extremely weak and tired.  A Panda feeding tube was just inserted into his nose.  He states that he feels terrible but denies any current pain. Nurse at bedside  providing support.  CSW provided introduction and support; will monitor for further stability and assist patient with short term placement at that time.

## 2014-07-11 NOTE — Progress Notes (Signed)
Unable to obtain 1000 finger stick as pt was down in radiology for TF place ment.  Will continue to monitor.  Ashon Rosenberg,RN.

## 2014-07-11 NOTE — Progress Notes (Signed)
Dextro 25% via IVP for BS at 44mg /dl  Up to 161 after recheck. Pt remain stable.  Dr Tyrell Antonio notified and instructed to continue doing CBG check q 2 hours until pt stable t60's range.  Will continue to monitor.  Karie Kirks, Therapist, sports.

## 2014-07-11 NOTE — Progress Notes (Signed)
[facility] PROGRESS NOTE  [name] EQA:[id number] DOB: 19-Dec-1927 DOA: 07/01/2014 PCP: [name], MD   brief narrative 80 y.o. male with past medical history of Dysrhythmia; Hypertension; Shortness of breath; Claudication of calf muscles (11/05/2012); GERD (gastroesophageal reflux disease); TIA (transient ischemic attack) (2010); Peripheral vascular disease; On home oxygen therapy; Neuromuscular disorder; Macular degeneration, bilateral; Coronary artery disease; COPD on home o2; Type II diabetes mellitus; hx if Stroke (05/2013); Arthritis; and Pneumonia. Presented with 2 day hx of nausea vomiting and diarrhea. CT abdomen and pelvis on admission showed significant distended stomach filled with air and fluid with decompression of pylorus and first segment of duodenum suggestive of diffuse gastric emphysema. Patient admitted to medicine and NG placed with high output.   Assessment/Plan: Gastric emphysema// Gastric dysmotility:  -Gastric output at 450 yesterday, today at 200. -Clamp NG tube, start tube feeding, appreciate Dr Cristina Gong help.   -Keep NPO. Empiric zosyn  Day 8 and received 6 fluconazole. Will stop fluconazole. Will stop zosyn.  -Holding po meds. IV fluids and Pain control with prn morphine.  -On schedule Reglan for gastric motility -Surgery sign off no indication for surgery and GI following. No signs of obstruction on imaging. -Endoscopy: was normal.  -Continue with Protonix, GI prophylaxis.   -received tube feeding for almost 2 days. Tube feedings stop, patient unable to tolerates it.  -N-G tube Back to suctioning.  -S/P Nasojejunal tube by IR on 10-19. Plan to start tube feeding tonight.   Hypoglycemia:  Multiple episodes yesterday. On D 5 IV fluids. Start tube feeding today. Follow sodium level.   Hyponatremia; Change fluids D 5- half NS.   Acute renal failure:  Appears to be prerenal with severe dehydration secondary to high gastric output Continue with IV  fluids.  -Monitor in a.m. -Cr peak to 2.1. Cr trending down 1.1  Hypernatremia; resolved.  Secondary to dehydration. Resolved on D 5.  Decrease IV fluids.  Na 157---150---141--132  Afib on low dose scheduled IV lopressor. Po Coreg and ASA  on hold  COPD Prn nebs  Recurrent left pleural effusion Chronic , followed by thoracic surgery requiring thoracentesis. Non malignant effusion.    HTN  off home po meds. Iv lopressor and prn hydralazine  DM Hold metformin. SSI (sensitive )  Hypokalemia Replenish with K-Cl  Gastroenteritis Supportive care with fluids, antiemetics and pain control No further diarrhea since admission.   DVT prophylaxis SQ heparin  Diet: N.p.o.    Code Status: change to partial Code, intubation.  Family Communication: updated wife 90-18.  Disposition Plan: continue inpt monitoring   Consultants:  [facility]  [facility]  Procedures:  none  Antibiotics:  IV zosyn 10/10--10-18   IV fluconazole 10/10--07-03-15  HPI/Subjective: He is alert, answering questions, speech more clear. Denies abdominal pain.    Objective: Filed Vitals:   07/11/14 1300  BP: 121/77  Pulse: 71  Temp:   Resp: 18    Intake/Output Summary (Last 24 hours) at 07/11/14 1636 Last data filed at 07/11/14 1433  Gross per 24 hour  Intake      0 ml  Output    920 ml  Net   -920 ml   Filed Weights   07/09/14 0449 07/10/14 0413 07/11/14 0300  Weight: 58.6 kg (129 lb 3 oz) 57.4 kg (126 lb 8.7 oz) 56.83 kg (125 lb 4.6 oz)    Exam:   General:  Elderly male in NAD  HEENT: NG in place with high output, dry mucosa  Cardiovascular: S1&S2  irregular, no murmurs  Respiratory: clear b/l  Abdomen: soft,  Non dnstended, no tenderness today, bowel sounds present,    Musculoskeletal: warm, no edema   Data Reviewed: Basic Metabolic Panel:  Recent Labs Lab 07/07/14 0651 07/08/14 0315 07/09/14 0409 07/10/14 0403 07/11/14 1120  NA 150* 145 141 137 132*  K 3.3* 3.6*  4.1 4.1 4.0  CL 101 101 99 96 93*  CO2 36* 33* 31 29 26   GLUCOSE 162* 163* 120* 133* 168*  BUN 44* 37* 32* 25* 17  CREATININE 1.30 1.19 1.13 1.12 0.98  CALCIUM 8.8 8.5 8.4 8.8 9.1   Liver Function Tests:  Recent Labs Lab 07/05/14 0421  AST 23  ALT 16  ALKPHOS 74  BILITOT 0.6  PROT 6.6  ALBUMIN 2.6*   No results found for this basename: LIPASE, AMYLASE,  in the last 168 hours No results found for this basename: AMMONIA,  in the last 168 hours CBC:  Recent Labs Lab 07/05/14 0421 07/07/14 0651 07/08/14 0315 07/09/14 0409 07/10/14 0403  WBC 9.1 9.7 9.9 10.6* 8.9  NEUTROABS 5.7  --   --   --   --   HGB 12.7* 12.4* 11.3* 11.5* 11.5*  HCT 40.1 38.7* 35.4* 35.9* 35.9*  MCV 101.8* 99.5 99.7 99.7 101.4*  PLT 206 188 181 174 192   Cardiac Enzymes: No results found for this basename: CKTOTAL, CKMB, CKMBINDEX, TROPONINI,  in the last 168 hours BNP (last 3 results) No results found for this basename: PROBNP,  in the last 8760 hours CBG:  Recent Labs Lab 07/11/14 0354 07/11/14 0620 07/11/14 0828 07/11/14 1131 07/11/14 1516  GLUCAP 195* 101* 134* 86 131*    Recent Results (from the past 240 hour(s))  CULTURE, BLOOD (ROUTINE X 2)     Status: None   Collection Time    07/02/14  4:24 AM      Result Value Ref Range Status   Specimen Description BLOOD LEFT HAND   Final   Special Requests BOTTLES DRAWN AEROBIC AND ANAEROBIC [place]   Final   Culture  Setup Time     Final   Value: 07/02/2014 11:05     Performed at [place]     Final   Value: NO GROWTH 5 DAYS     Performed at [facility]   Report Status 07/08/2014 FINAL   Final  CULTURE, BLOOD (ROUTINE X 2)     Status: None   Collection Time    07/02/14  4:31 AM      Result Value Ref Range Status   Specimen Description BLOOD RIGHT FOREARM   Final   Special Requests BOTTLES DRAWN AEROBIC AND ANAEROBIC 5CC EACH   Final   Culture  Setup Time     Final   Value: 07/02/2014 11:05      Performed at [place]     Final   Value: NO GROWTH 5 DAYS     Performed at [facility]   Report Status 07/08/2014 FINAL   Final  URINE CULTURE     Status: None   Collection Time    07/02/14 11:41 AM      Result Value Ref Range Status   Specimen Description URINE, RANDOM   Final   Special Requests NONE   Final   Culture  Setup Time     Final   Value: 07/02/2014 19:38     Performed at [facility]  [place] Count     Final   Value: NO GROWTH     Performed at [facility]   Culture     Final   Value: NO GROWTH     Performed at [facility]   Report Status 07/03/2014 FINAL   Final     Studies: Dg Abd 1 View  07/11/2014   CLINICAL DATA:  80 year old male in need of enteral access for feeding. Feeding tube placement.  EXAM: ABDOMEN - 1 VIEW  COMPARISON:  Abdominal radiograph 06/25/2014.  FINDINGS: Per report from the technologist a 12 French feeding tube was placed via a nasogastric approach under fluoroscopy (2 min and 19 seconds of fluoroscopy utilized). The fluoroscopic image captured was taken after injection of 30 mL of Omnipaque 300 into the tube. This confirms placement of the tip of the tube in the proximal jejunum just distal to the ligament of Treitz. A nasogastric tube is also seen projecting over the stomach.  IMPRESSION: 1. Proper placement of feeding tube in the proximal jejunum just distal to the ligament of Treitz.   Electronically Signed   By: Vinnie Langton M.D.   On: 07/11/2014 11:23    Scheduled Meds: . antiseptic oral rinse  7 mL Mouth Rinse BID  . antiseptic oral rinse  7 mL Mouth Rinse q12n4p  . chlorhexidine  15 mL Mouth Rinse BID  . feeding supplement (PRO-STAT SUGAR FREE 64)  30 mL Per Tube Q24H  . heparin subcutaneous  5,000 Units Subcutaneous 3 times per day  . metoCLOPramide (REGLAN) injection  10 mg Intravenous 4 times per day  . metoprolol  2.5 mg Intravenous 3 times per day  . pantoprazole  (PROTONIX) IV  40 mg Intravenous Q12H  . sodium chloride  3 mL Intravenous Q12H  . tiotropium  18 mcg Inhalation Daily   Continuous Infusions: . dextrose 100 mL/hr at 07/11/14 1406  . feeding supplement (JEVITY 1.2 CAL) 1,000 mL (07/11/14 1536)      Time spent: 25 minutes    Theoplis Garciagarcia, [facility] Pager [phone]. If 7PM-7AM, please contact night-coverage at [email or web address], password [facility] 07/11/2014, 4:36 PM  LOS: 10 days

## 2014-07-12 ENCOUNTER — Inpatient Hospital Stay (HOSPITAL_COMMUNITY): Payer: Medicare Other

## 2014-07-12 LAB — GLUCOSE, CAPILLARY
GLUCOSE-CAPILLARY: 126 mg/dL — AB (ref 70–99)
GLUCOSE-CAPILLARY: 144 mg/dL — AB (ref 70–99)
GLUCOSE-CAPILLARY: 172 mg/dL — AB (ref 70–99)
GLUCOSE-CAPILLARY: 186 mg/dL — AB (ref 70–99)
GLUCOSE-CAPILLARY: 203 mg/dL — AB (ref 70–99)
Glucose-Capillary: 195 mg/dL — ABNORMAL HIGH (ref 70–99)
Glucose-Capillary: 202 mg/dL — ABNORMAL HIGH (ref 70–99)
Glucose-Capillary: 181 mg/dL — ABNORMAL HIGH (ref 70–99)
Glucose-Capillary: 186 mg/dL — ABNORMAL HIGH (ref 70–99)

## 2014-07-12 LAB — CBC
HEMATOCRIT: 32.8 % — AB (ref 39.0–52.0)
HEMOGLOBIN: 10.8 g/dL — AB (ref 13.0–17.0)
MCH: 32.5 pg (ref 26.0–34.0)
MCHC: 32.9 g/dL (ref 30.0–36.0)
MCV: 98.8 fL (ref 78.0–100.0)
Platelets: ADEQUATE 10*3/uL (ref 150–400)
RBC: 3.32 MIL/uL — ABNORMAL LOW (ref 4.22–5.81)
RDW: 14.4 % (ref 11.5–15.5)
WBC: 6.5 10*3/uL (ref 4.0–10.5)

## 2014-07-12 LAB — BASIC METABOLIC PANEL
ANION GAP: 14 (ref 5–15)
BUN: 14 mg/dL (ref 6–23)
CHLORIDE: 99 meq/L (ref 96–112)
CO2: 24 mEq/L (ref 19–32)
CREATININE: 0.89 mg/dL (ref 0.50–1.35)
Calcium: 8.7 mg/dL (ref 8.4–10.5)
GFR, EST AFRICAN AMERICAN: 89 mL/min — AB (ref >90)
GFR, EST NON AFRICAN AMERICAN: 76 mL/min — AB (ref >90)
Glucose, Bld: 181 mg/dL — ABNORMAL HIGH (ref 70–99)
Potassium: 4.1 mEq/L (ref 3.7–5.3)
Sodium: 137 mEq/L (ref 137–147)

## 2014-07-12 NOTE — Progress Notes (Signed)
[facility] PROGRESS NOTE  [name] ZOX:[id number] DOB: 06-29-1928 DOA: 07/01/2014 PCP: [name], MD   brief narrative 80 y.o. male with past medical history of Dysrhythmia; Hypertension; Shortness of breath; Claudication of calf muscles (11/05/2012); GERD (gastroesophageal reflux disease); TIA (transient ischemic attack) (2010); Peripheral vascular disease; On home oxygen therapy; Neuromuscular disorder; Macular degeneration, bilateral; Coronary artery disease; COPD on home o2; Type II diabetes mellitus; hx if Stroke (05/2013); Arthritis; and Pneumonia. Presented with 2 day hx of nausea vomiting and diarrhea. CT abdomen and pelvis on admission showed significant distended stomach filled with air and fluid with decompression of pylorus and first segment of duodenum suggestive of diffuse gastric emphysema. Patient admitted to medicine and NG placed with high output.   Patient slowly improving. He has nasojejunal tube in place for nutrition. And NG tube gastric  to be use for suction as needed. Continue with IV Reglan.  Patient today with AMS. Hard to wake up and lethargic. After stimuli he wake up, answer some questions, follow some command. Will check CT head. Frequent neuro check.   Assessment/Plan: Gastric emphysema// Gastric dysmotility:  -Gastric output at 450 yesterday, today at 200. -Clamp NG tube, start tube feeding, appreciate Dr Cristina Gong help.   -Keep NPO. Received 8 days of  zosyn  And  6 fluconazole.  -Holding po meds. IV fluids and Pain control with prn morphine.  -On schedule Reglan for gastric motility -Surgery sign off no indication for surgery and GI following. No signs of obstruction on imaging. -Endoscopy: was normal.  -Continue with Protonix, GI prophylaxis.   -S/P Nasojejunal tube by IR on 10-19. Started on tube feeding 10-19.  -NG tube in place for suction as needed.   Encephalopathy; suspect hospital; delirium , no evidence of acidosis or in crease CO2 on  B-met. No hypoglycemia. Continue with neuro check. Will order CT head.   Hypoglycemia:  Multiple episodes yesterday.  On D 5 half NS.  CBG better. Might need to decrease fluids.   Hyponatremia; Change fluids D 5- half NS.   Acute renal failure:  Appears to be prerenal with severe dehydration secondary to high gastric output Continue with IV fluids.  -Monitor in a.m. -Cr peak to 2.1. Cr trending down 0.89  Hypernatremia; resolved.  Secondary to dehydration. Resolved on D 5.  Decrease IV fluids.  Na 157---150---141--132---137  Afib on low dose scheduled IV lopressor. Po Coreg and ASA  on hold  COPD Prn nebs  Recurrent left pleural effusion Chronic , followed by thoracic surgery requiring thoracentesis. Non malignant effusion.    HTN  off home po meds. Iv lopressor and prn hydralazine  DM Hold metformin. SSI (sensitive )  Hypokalemia Replenish with K-Cl  Gastroenteritis Supportive care with fluids, antiemetics and pain control No further diarrhea since admission.   DVT prophylaxis SQ heparin  Diet: N.p.o.    Code Status: change to partial Code, intubation.  Family Communication: updated wife 11-18.  Disposition Plan: continue inpt monitoring   Consultants:  [facility]  [facility]  Procedures:  none  Antibiotics:  IV zosyn 10/10--10-18   IV fluconazole 10/10--07-03-15  HPI/Subjective: Patient was very sleepy this morning, very hard to wake up. He them open eyes, answer some questions. Mover right side. He has chronic left side weakness after stroke.   Objective: Filed Vitals:   07/12/14 0642  BP: 112/60  Pulse: 62  Temp:   Resp:     Intake/Output Summary (Last 24 hours) at 07/12/14 0840 Last data filed at  07/12/14 0320  Gross per 24 hour  Intake    120 ml  Output    600 ml  Net   -480 ml   Filed Weights   07/10/14 0413 07/11/14 0300 07/12/14 0425  Weight: 57.4 kg (126 lb 8.7 oz) 56.83 kg (125 lb 4.6 oz) 57.2 kg (126 lb 1.7 oz)     Exam:   General:  Elderly male in NAD  HEENT: NG in place with high output, dry mucosa  Cardiovascular: S1&S2 irregular, no murmurs  Respiratory: clear b/l  Abdomen: soft,  Non dnstended, no tenderness today, bowel sounds present,    Musculoskeletal: warm, no edema   Data Reviewed: Basic Metabolic Panel:  Recent Labs Lab 07/08/14 0315 07/09/14 0409 07/10/14 0403 07/11/14 1120 07/12/14 0635  NA 145 141 137 132* 137  K 3.6* 4.1 4.1 4.0 4.1  CL 101 99 96 93* 99  CO2 33* $Remov'31 29 26 24  'cjLWXe$ GLUCOSE 163* 120* 133* 168* 181*  BUN 37* 32* 25* 17 14  CREATININE 1.19 1.13 1.12 0.98 0.89  CALCIUM 8.5 8.4 8.8 9.1 8.7   Liver Function Tests: No results found for this basename: AST, ALT, ALKPHOS, BILITOT, PROT, ALBUMIN,  in the last 168 hours No results found for this basename: LIPASE, AMYLASE,  in the last 168 hours No results found for this basename: AMMONIA,  in the last 168 hours CBC:  Recent Labs Lab 07/07/14 0651 07/08/14 0315 07/09/14 0409 07/10/14 0403  WBC 9.7 9.9 10.6* 8.9  HGB 12.4* 11.3* 11.5* 11.5*  HCT 38.7* 35.4* 35.9* 35.9*  MCV 99.5 99.7 99.7 101.4*  PLT 188 181 174 192   Cardiac Enzymes: No results found for this basename: CKTOTAL, CKMB, CKMBINDEX, TROPONINI,  in the last 168 hours BNP (last 3 results) No results found for this basename: PROBNP,  in the last 8760 hours CBG:  Recent Labs Lab 07/11/14 2156 07/11/14 2354 07/12/14 0322 07/12/14 0601 07/12/14 0808  GLUCAP 167* 185* 186* 203* 195*    Recent Results (from the past 240 hour(s))  URINE CULTURE     Status: None   Collection Time    07/02/14 11:41 AM      Result Value Ref Range Status   Specimen Description URINE, RANDOM   Final   Special Requests NONE   Final   Culture  Setup Time     Final   Value: 07/02/2014 19:38     Performed at [place]     Final   Value: NO GROWTH     Performed at [facility]   Culture     Final   Value: NO GROWTH      Performed at [facility]   Report Status 07/03/2014 FINAL   Final     Studies: Dg Abd 1 View  07/11/2014   CLINICAL DATA:  80 year old male in need of enteral access for feeding. Feeding tube placement.  EXAM: ABDOMEN - 1 VIEW  COMPARISON:  Abdominal radiograph 06/25/2014.  FINDINGS: Per report from the technologist a 12 French feeding tube was placed via a nasogastric approach under fluoroscopy (2 min and 19 seconds of fluoroscopy utilized). The fluoroscopic image captured was taken after injection of 30 mL of Omnipaque 300 into the tube. This confirms placement of the tip of the tube in the proximal jejunum just distal to the ligament of Treitz. A nasogastric tube is also seen projecting over the stomach.  IMPRESSION: 1. Proper placement of feeding tube in  the proximal jejunum just distal to the ligament of Treitz.   Electronically Signed   By: Vinnie Langton M.D.   On: 07/11/2014 11:23   Dg Addison Bailey G Tube Plc W/fl-no Rad  07/11/2014   CLINICAL DATA:    NASO G TUBE PLACEMENT WITH FLUORO  Fluoroscopy was utilized by the requesting physician.  No radiographic  interpretation.     Scheduled Meds: . antiseptic oral rinse  7 mL Mouth Rinse BID  . antiseptic oral rinse  7 mL Mouth Rinse q12n4p  . chlorhexidine  15 mL Mouth Rinse BID  . feeding supplement (PRO-STAT SUGAR FREE 64)  30 mL Per Tube Q24H  . heparin subcutaneous  5,000 Units Subcutaneous 3 times per day  . metoCLOPramide (REGLAN) injection  10 mg Intravenous 4 times per day  . metoprolol  2.5 mg Intravenous 3 times per day  . pantoprazole (PROTONIX) IV  40 mg Intravenous Q12H  . sodium chloride  3 mL Intravenous Q12H  . tiotropium  18 mcg Inhalation Daily   Continuous Infusions: . dextrose 5 % and 0.45% NaCl 100 mL/hr at 07/12/14 0515  . feeding supplement (JEVITY 1.2 CAL) 1,000 mL (07/11/14 1902)      Time spent: 25 minutes    Mikko Lewellen A  [facility] Pager [phone]. If 7PM-7AM, please contact  night-coverage at [email or web address], password [facility] 07/12/2014, 8:40 AM  LOS: 11 days

## 2014-07-12 NOTE — Progress Notes (Signed)
Successful feeding tube placement yesterday per Radiology (beyond Lig of Treitz).  TF ordered per dietitian, being advanced w/out apparent complications.  NG output has declined over the past 48 hrs.  Exam:  abd scaphoid, quiet BS's, soft, NT.  Pt awake and alert at this time.  Impr:  Prolonged gastric ileus following some sort of insult (?ischemic  ? Viral)  Plan:  1.  Advance TF to goal rate. 2.  Monitor NG output and D/C NG when it is low for a couple of consecutive days (?less than 600 mL total?) 3.  Hold off on oral intake for a while--the main job right now is nutritional restitution and avoiding need for prolongation of NG suction.  Cleotis Nipper, M.D. [phone]

## 2014-07-13 ENCOUNTER — Inpatient Hospital Stay (HOSPITAL_COMMUNITY): Payer: Medicare Other

## 2014-07-13 LAB — GLUCOSE, CAPILLARY
GLUCOSE-CAPILLARY: 126 mg/dL — AB (ref 70–99)
Glucose-Capillary: 211 mg/dL — ABNORMAL HIGH (ref 70–99)
Glucose-Capillary: 214 mg/dL — ABNORMAL HIGH (ref 70–99)
Glucose-Capillary: 144 mg/dL — ABNORMAL HIGH (ref 70–99)

## 2014-07-13 MED ORDER — INSULIN ASPART 100 UNIT/ML ~~LOC~~ SOLN
0.0000 [IU] | SUBCUTANEOUS | Status: DC
  Administered 2014-07-13 (×2): 3 [IU] via SUBCUTANEOUS

## 2014-07-13 MED ORDER — FREE WATER
90.0000 mL | Status: DC
  Administered 2014-07-13 – 2014-07-18 (×37): 90 mL

## 2014-07-13 NOTE — Progress Notes (Signed)
CSW met with patient and his wife Demetrius Charity  [phone] (c) this morning to discuss Physical Therapy's recommendation for short term rehab when medically stable.  Per wife- patient will be returning home and they decline SNF recommendation. Patient has been in several nursing centers in the past and she does not want him to return there. Patient agreed with this as well.  They request home health services at d/c- maximum services possible. CSW notify [place], RNCM of above.  Wife feels that they will be able to manage at home without any problems and that their daughter will be helping at home. She stated that daughter did not want SNF placement either. Further CSW issues were not identified by patient or wife;  CSW signing off.  Lorie Phenix. Pauline Good, [place]

## 2014-07-13 NOTE — Progress Notes (Addendum)
The patient continues to do reasonably well. His NG suction was stopped sometime within the past 24 hours, and he is without nausea or abdominal pain.  I checked the residual in his stomach by aspirating on his NG tube (after connecting to wall suction returned only about 50 ML), and I was able to get out a total of about 300 mL of gastric juice. This is after the NG has been planned for at least 8 hours.   Discussion and recommendation: I think the patient has sufficient gastric emptying function to allow [place] to remove his NG tube at this time. The somewhat large residual is probably a reflection of persistent gastric dysmotility. We will need to watch for a return of abdominal distention, abdominal discomfort, or vomiting.   However, he is starting to get a sore in his left nostril from the prolonged NG tube contact, and I think it would be best to get the tube out. I will obtain a KUB to make sure that the panda tube does not get withdrawn back into the stomach in the process of pulling the NG tube.  We will follow the patient with you at a distance. Please call as at anytime if more immediate input from [place] is needed.  Cleotis Nipper, M.D. [phone]  On exam today, the patient is seated in a chair, has quiet bowel sounds, no evident abdominal distention, no tenderness.

## 2014-07-13 NOTE — Clinical Social Work Placement (Addendum)
Clinical Social Work Department CLINICAL SOCIAL WORK PLACEMENT NOTE 07/22/2014  Patient:  TIMON, GEISSINGER  Account Number:  [id number] Admit date:  07/01/2014  Clinical Social Worker:  Butch Penny Anuradha Chabot, LCSW  Date/time:  07/11/2014 04:30 PM  Clinical Social Work is seeking post-discharge placement for this patient at the following level of care:   SKILLED NURSING   (*CSW will update this form in Epic as items are completed)   07/11/2014  Patient/family provided with [place] Department of Clinical Social Work's list of facilities offering this level of care within the geographic area requested by the patient (or if unable, by the patient's family).  07/11/2014  Patient/family informed of their freedom to choose among providers that offer the needed level of care, that participate in Medicare, Medicaid or managed care program needed by the patient, have an available bed and are willing to accept the patient.  07/11/2014  Patient/family informed of [facility]' ownership interest in [facility], as well as of the fact that they are under no obligation to receive care at this facility.  PASARR submitted to EDS on  PASARR number received on   FL2 transmitted to all facilities in geographic area requested by pt/family on  07/20/2014 FL2 transmitted to all facilities within larger geographic area on   Patient informed that his/her managed care company has contracts with or will negotiate with  certain facilities, including the following:   [facility] Medicare     Patient/family informed of bed offers received:  07/21/2014 Patient chooses bed at [facility] Physician recommends and patient chooses bed at    Patient to be transferred to [facility] on  07/22/2014 Patient to be transferred to facility by Ambulance  [facility]) Patient and family notified of transfer on 07/22/2014 Name of family member notified:  Demetrius Charity- Wife  The  following physician request were entered in Epic: Physician Request  Please prepare priority discharge summary and prescriptions.  Please sign FL2.    Additional Comments: 07/13/14  CSW met with patient and wife- Mable today to discuss SNF recommendation by PT.  They decline SNF option and plan to return home with Home Health. CSW notified [place] of above.  CSW signing off.  Lorie Phenix. [place], [place] 07/22/14  Patient and wife changed their minds about SNF short term SNF as patient has had a lengthy hospital stay and is very weak. Now agree to SNF placement.  Nursing notified to call report.  Patient and wife are agreeable to d/c. No further CSW needs identified at this time and will sign off.

## 2014-07-13 NOTE — Progress Notes (Signed)
Patient blood sugars have been over 100 for the past day or two.  Patient is no longer q2h CBG checks, he is now q4h CBG checks.  His feeding tube has reached the goal amount of 15mL/hr.    No nausea or vomiting at the time.    Will continue to monitor.

## 2014-07-13 NOTE — Significant Event (Signed)
Patient c/o chest pain EKG obtained and Dr Wynelle Cleveland informed VSS patient now states he does not have chest pain at this time MD aware. No change in EKG from previous EKG. Patient remains nonsymptomatic and pain free.

## 2014-07-13 NOTE — Progress Notes (Signed)
Inpatient Diabetes Program Recommendations  AACE/ADA: New Consensus Statement on Inpatient Glycemic Control  Target Ranges:  Prepandial:   less than 140 mg/dL      Peak postprandial:   less than 180 mg/dL (1-2 hours)      Critically ill patients:  140 - 180 mg/dL  Pager:  [phone] Hours:  8 am-10pm   Reason for Visit: Elevated glucose  Results for [name], [name] (MRN [id number]) as of 07/13/2014 09:52  Ref. Range 07/12/2014 23:55 07/13/2014 03:49 07/13/2014 07:53  Glucose-Capillary Latest Range: 70-99 mg/dL 172 (H) 211 (H) 214 (H)    Inpatient Diabetes Program Recommendations Correction (SSI): Add Novolog Sensitive Correction  Courtney Heys PhD, RN, BC-ADM Diabetes Coordinator  Office:  [phone] Team Pager:  [phone]

## 2014-07-13 NOTE — Significant Event (Signed)
NGT D/C per md order post KUB done.

## 2014-07-13 NOTE — Progress Notes (Addendum)
[facility] Progress Note   [name] AJO:[id number] DOB: 12-12-1927 DOA: 07/01/2014 PCP: [name], MD  Brief narrative: [name] is a 80 y.o. male with h/o PVD, COPD on home O2, CAD, A-fib, DM 2, HTN, chronic left pleural effusion, CVA with left arm weakness, PVD- femoral artery stent and claudication of calf muscles and Macular degeneration.   The patient presents to the hospital with nausea, vomiting and diarrhea and is found to have gastric and duodenal emphysema.  He was started on broad spectrum antibiotics due to suspected gastroenteritis and managed with NG tube suctioning. As improvement was noted, tube feeds were initiated on 10/15 along with Reglan for gastroparesis however, as he did not tolerate the feeds, NG was switched back to suction on 10/17.  Nasojejunal tube placed on 10/19 by IR and tube feeds resumed.    Subjective: No complaints today  Assessment/Plan: Gastric emphysema/ Gastric dysmotility:  -S/P Nasojejunal tube by IR on 10-19. Started on tube feeding 10-19.  -started tube feeding which he is tolerating well- will allow Dr Dr Cristina Gong to decide on when to start oral feeds -Keep NPO. Received 8 days of zosyn And 6 fluconazole.  -Holding po meds. IV fluids and Pain control with prn morphine.  -On schedule Reglan for gastric motility  -Surgery consult appreciated- no need for surgery at this time -Endoscopy: was normal.  -Continue with Protonix, GI prophylaxis.    Encephalopathy - suspected to be hospital acquired delirium resolved.   Hypoglycemia:  Started on D 5 half NS with improvement- will d/c IVF as he is tolerating tube feeds now  Hyponatremia/hypernatremia  Resolved- last sodium was 137   Acute renal failure:  Appears to be prerenal with severe dehydration secondary to high gastric output  -Cr peak to 2.1. Cr trending down 0.89   Afib  on low dose scheduled IV lopressor. Po Coreg and ASA on hold  - is not on  anticoagulation at home  COPD  Prn nebs   Recurrent left pleural effusion  Chronic - followed by thoracic surgery requiring thoracentesis. Non malignant effusion.   HTN  off home po meds. Iv lopressor and prn hydralazine   DM  Hold metformin. SSI (sensitive )   Hypokalemia  Replenish with K-Cl   Gastroenteritis  Supportive care with fluids, antiemetics and pain control  No further diarrhea since admission.       Code Status: no intubation Family Communication: with wife at bedside Disposition Plan: SNR vs home with 24 hr supervision DVT prophylaxis: s/q heparin  Consultants: GI  Procedures: NJ tube  Antibiotics: Anti-infectives   Start     Dose/Rate Route Frequency Ordered Stop   07/02/14 1200  piperacillin-tazobactam (ZOSYN) IVPB 3.375 g  Status:  Discontinued     3.375 g 12.5 mL/hr over 240 Minutes Intravenous Every 8 hours 07/02/14 0421 07/10/14 1355   07/02/14 0430  piperacillin-tazobactam (ZOSYN) IVPB 3.375 g  Status:  Discontinued     3.375 g 100 mL/hr over 30 Minutes Intravenous  Once 07/02/14 0421 07/02/14 1625   07/02/14 0400  fluconazole (DIFLUCAN) IVPB 200 mg  Status:  Discontinued     200 mg 100 mL/hr over 60 Minutes Intravenous Every 24 hours 07/02/14 0345 07/07/14 1313   07/02/14 0000  cefTRIAXone (ROCEPHIN) 1 g in dextrose 5 % 50 mL IVPB  Status:  Discontinued     1 g 100 mL/hr over 30 Minutes Intravenous  Once 07/01/14 2354 07/01/14 2357  Objective: Filed Weights   07/11/14 0300 07/12/14 0425 07/13/14 0347  Weight: 56.83 kg (125 lb 4.6 oz) 57.2 kg (126 lb 1.7 oz) 57.6 kg (126 lb 15.8 oz)    Intake/Output Summary (Last 24 hours) at 07/13/14 0808 Last data filed at 07/13/14 0526  Gross per 24 hour  Intake    740 ml  Output    225 ml  Net    515 ml     Vitals Filed Vitals:   07/12/14 1049 07/12/14 1604 07/12/14 1943 07/13/14 0347  BP: 120/69 108/55 108/57 115/54  Pulse: 75 75 110 78  Temp:   96.2 F (35.7 C) 97.7 F  (36.5 C)  TempSrc:   Oral Axillary  Resp:   18 18  Height:      Weight:    57.6 kg (126 lb 15.8 oz)  SpO2:   90% 96%    Exam: General: No acute respiratory distress Lungs: Clear to auscultation bilaterally without wheezes or crackles Cardiovascular: Regular rate and rhythm without murmur gallop or rub normal S1 and S2 Abdomen: Nontender, nondistended, soft, bowel sounds positive, no rebound, no ascites, no appreciable mass Extremities: No significant cyanosis, clubbing, or edema bilateral lower extremities  Data Reviewed: Basic Metabolic Panel:  Recent Labs Lab 07/08/14 0315 07/09/14 0409 07/10/14 0403 07/11/14 1120 07/12/14 0635  NA 145 141 137 132* 137  K 3.6* 4.1 4.1 4.0 4.1  CL 101 99 96 93* 99  CO2 33* 31 29 26 24   GLUCOSE 163* 120* 133* 168* 181*  BUN 37* 32* 25* 17 14  CREATININE 1.19 1.13 1.12 0.98 0.89  CALCIUM 8.5 8.4 8.8 9.1 8.7   Liver Function Tests: No results found for this basename: AST, ALT, ALKPHOS, BILITOT, PROT, ALBUMIN,  in the last 168 hours No results found for this basename: LIPASE, AMYLASE,  in the last 168 hours No results found for this basename: AMMONIA,  in the last 168 hours CBC:  Recent Labs Lab 07/07/14 0651 07/08/14 0315 07/09/14 0409 07/10/14 0403 07/12/14 1155  WBC 9.7 9.9 10.6* 8.9 6.5  HGB 12.4* 11.3* 11.5* 11.5* 10.8*  HCT 38.7* 35.4* 35.9* 35.9* 32.8*  MCV 99.5 99.7 99.7 101.4* 98.8  PLT 188 181 174 192 PLATELETS APPEAR ADEQUATE   Cardiac Enzymes: No results found for this basename: CKTOTAL, CKMB, CKMBINDEX, TROPONINI,  in the last 168 hours BNP (last 3 results) No results found for this basename: PROBNP,  in the last 8760 hours CBG:  Recent Labs Lab 07/12/14 1945 07/12/14 2215 07/12/14 2355 07/13/14 0349 07/13/14 0753  GLUCAP 126* 144* 172* 211* 214*    No results found for this or any previous visit (from the past 240 hour(s)).   Studies:  Recent x-ray studies have been reviewed in detail by the  Attending Physician  Scheduled Meds:  Scheduled Meds: . antiseptic oral rinse  7 mL Mouth Rinse BID  . antiseptic oral rinse  7 mL Mouth Rinse q12n4p  . chlorhexidine  15 mL Mouth Rinse BID  . feeding supplement (PRO-STAT SUGAR FREE 64)  30 mL Per Tube Q24H  . heparin subcutaneous  5,000 Units Subcutaneous 3 times per day  . metoCLOPramide (REGLAN) injection  10 mg Intravenous 4 times per day  . metoprolol  2.5 mg Intravenous 3 times per day  . pantoprazole (PROTONIX) IV  40 mg Intravenous Q12H  . sodium chloride  3 mL Intravenous Q12H  . tiotropium  18 mcg Inhalation Daily   Continuous Infusions: . dextrose 5 % and  0.45% NaCl 100 mL/hr at 07/13/14 0508  . feeding supplement (JEVITY 1.2 CAL) 1,000 mL (07/13/14 0200)    Time spent on care of this patient: 40 min   [place], MD 07/13/2014, 8:08 AM  LOS: 12 days   [facility] Office  [phone] Pager - Text Page per [email or web address]  If 7PM-7AM, please contact night-coverage [email or web address]

## 2014-07-13 NOTE — Progress Notes (Signed)
NUTRITION FOLLOW UP  Intervention:   Continue Jevity 1.2 @ 60 ml/hr via panda NJ tube; goal rate of 60 ml/hr 30 ml Pro-stat once daily. Tube feeding regimen provides 1828 kcal (100% of needs), 95 grams of protein, and 1166 ml of H2O.  Add 90 ml free water flushes every 3 hours to provide and additional 720 ml daily and a total of 1886 ml per 24 hours.   Nutrition Dx:   1) Increased nutrient needs related to chronic illness as evidenced by estimated nutrition needs; ongoing  2) Inadequate oral intake related to inability to eat as evidenced by NPO status; ongoing  Goal:   Pt to meet >/= 90% of their estimated nutrition needs; ongoing  Monitor:   TF rate/tolerance, diet advancement/PO intake, weight trend, labs, I/O's  Assessment:   Pt with PMH of Dysrhythmia; HTN; SOB; Claudication of calf muscles; GERD; TIA ; Peripheral vascular disease; On home oxygen therapy; Neuromuscular disorder; Macular degeneration, bilateral; Coronary artery disease; COPD; T2DM; Stroke; Arthritis; and Pneumonia. Presented with 2 day hx of nausea vomiting and diarrhea.  Pt is receiving Jevity 1.2 @ goal rate of 60 ml/hr via NJ tube. NG tube in place but, no longer to suction. Per RN pt is tolerating feedings well. Will d/c order for checking residuals as there is no need to check NJ residuals.  Weight has been stable the past couple days. Hope to see some weight gain with TF at goal rate.  Glucose has been high at time. Diabetes coordinator adjusted insulin coverage today. If Glucose continues to be elevated, can change TF formula to Glucerna 1.2. I/O: +7.6 L Labs: elevated glucose  Height: Ht Readings from Last 1 Encounters:  07/02/14 5\' 6"  (1.676 m)    Weight Status:   Wt Readings from Last 1 Encounters:  07/13/14 126 lb 15.8 oz (57.6 kg)  07/08/14 132 lb 07/02/14 138 lb  Re-estimated needs:  Kcal: 1700-1900  Protein: 90-100 gm  Fluid: 1.6-1.8 L  Skin: Stage I pressure ulcer on sacrum; non-pitting  RLE and LLE edema  Diet Order: NPO   Intake/Output Summary (Last 24 hours) at 07/13/14 1622 Last data filed at 07/13/14 1400  Gross per 24 hour  Intake    740 ml  Output    525 ml  Net    215 ml    Last BM: 10/20   Labs:   Recent Labs Lab 07/10/14 0403 07/11/14 1120 07/12/14 0635  NA 137 132* 137  K 4.1 4.0 4.1  CL 96 93* 99  CO2 29 26 24   BUN 25* 17 14  CREATININE 1.12 0.98 0.89  CALCIUM 8.8 9.1 8.7  GLUCOSE 133* 168* 181*    CBG (last 3)   Recent Labs  07/12/14 2355 07/13/14 0349 07/13/14 0753  GLUCAP 172* 211* 214*    Scheduled Meds: . antiseptic oral rinse  7 mL Mouth Rinse BID  . antiseptic oral rinse  7 mL Mouth Rinse q12n4p  . chlorhexidine  15 mL Mouth Rinse BID  . feeding supplement (PRO-STAT SUGAR FREE 64)  30 mL Per Tube Q24H  . heparin subcutaneous  5,000 Units Subcutaneous 3 times per day  . insulin aspart  0-9 Units Subcutaneous Q4H  . metoCLOPramide (REGLAN) injection  10 mg Intravenous 4 times per day  . metoprolol  2.5 mg Intravenous 3 times per day  . pantoprazole (PROTONIX) IV  40 mg Intravenous Q12H  . sodium chloride  3 mL Intravenous Q12H  . tiotropium  18 mcg  Inhalation Daily    Continuous Infusions: . feeding supplement (JEVITY 1.2 CAL) 1,000 mL (07/13/14 0200)    Pryor Ochoa RD, LDN Inpatient Clinical Dietitian Pager: [phone] After Hours Pager: [phone]

## 2014-07-14 LAB — GLUCOSE, CAPILLARY
GLUCOSE-CAPILLARY: 106 mg/dL — AB (ref 70–99)
GLUCOSE-CAPILLARY: 106 mg/dL — AB (ref 70–99)
GLUCOSE-CAPILLARY: 116 mg/dL — AB (ref 70–99)
GLUCOSE-CAPILLARY: 127 mg/dL — AB (ref 70–99)
GLUCOSE-CAPILLARY: 141 mg/dL — AB (ref 70–99)
GLUCOSE-CAPILLARY: 149 mg/dL — AB (ref 70–99)
Glucose-Capillary: 122 mg/dL — ABNORMAL HIGH (ref 70–99)
Glucose-Capillary: 107 mg/dL — ABNORMAL HIGH (ref 70–99)

## 2014-07-14 LAB — BASIC METABOLIC PANEL
Anion gap: 11 (ref 5–15)
BUN: 17 mg/dL (ref 6–23)
CO2: 24 meq/L (ref 19–32)
CREATININE: 0.74 mg/dL (ref 0.50–1.35)
Calcium: 8.6 mg/dL (ref 8.4–10.5)
Chloride: 102 mEq/L (ref 96–112)
GFR calc Af Amer: >90 mL/min (ref >90)
GFR calc non Af Amer: 82 mL/min — ABNORMAL LOW (ref >90)
GLUCOSE: 143 mg/dL — AB (ref 70–99)
Potassium: 4.9 mEq/L (ref 3.7–5.3)
Sodium: 137 mEq/L (ref 137–147)

## 2014-07-14 MED ORDER — INSULIN ASPART 100 UNIT/ML ~~LOC~~ SOLN
0.0000 [IU] | SUBCUTANEOUS | Status: DC
  Administered 2014-07-14 – 2014-07-15 (×5): 1 [IU] via SUBCUTANEOUS
  Administered 2014-07-16: 2 [IU] via SUBCUTANEOUS
  Administered 2014-07-16 – 2014-07-17 (×3): 1 [IU] via SUBCUTANEOUS
  Administered 2014-07-17: 2 [IU] via SUBCUTANEOUS
  Administered 2014-07-17: 1 [IU] via SUBCUTANEOUS
  Administered 2014-07-17 – 2014-07-18 (×6): 2 [IU] via SUBCUTANEOUS
  Administered 2014-07-19: 1 [IU] via SUBCUTANEOUS
  Administered 2014-07-19: 2 [IU] via SUBCUTANEOUS

## 2014-07-14 NOTE — Progress Notes (Signed)
Telemetry D/C'ed from patient again per MD order from 07/07/14. Will continue to monitor.

## 2014-07-14 NOTE — Progress Notes (Signed)
Physical Therapy Treatment Patient Details Name: [name] MRN: [id number] DOB: 08-12-1928 Today's Date: 07/14/2014    History of Present Illness Pt is an 80 y/o male admitted with an abnormal GI x-ray. Pt had an EGD 07/06/14 and was diagnosed with acute gastric distention, gastric pneumatosis, gastroparesis, and a motility disorder.    PT Comments    Pt requires A for all aspects of mobility.  Family present during session but declining when PT attempted to have family participate in transfers.  Pt/family ed on level of A required for transfers at this time and pt states he is fine with going for further rehab, however family member adamantly denies and states pt will be fine.  At this time still feel safest D/C plan is for SNF for further rehab, however if returns to home will require 24hr A at home.  Will continue to follow.    Follow Up Recommendations  Home health PT;Supervision/Assistance - 24 hour     Equipment Recommendations  3in1 (PT)    Recommendations for Other Services       Precautions / Restrictions Precautions Precautions: Fall Restrictions Weight Bearing Restrictions: No    Mobility  Bed Mobility               General bed mobility comments: pt sitting in recliner.    Transfers Overall transfer level: Needs assistance Equipment used: 1 person hand held assist Transfers: Sit to/from [facility] Sit to Stand: Mod assist Stand pivot transfers: Max assist       General transfer comment: cues for use of R UE and step by step through transfers.  Repeated transfers x4.  pt fatigues easily.    Ambulation/Gait                 Stairs            Wheelchair Mobility    Modified Rankin (Stroke Patients Only)       Balance Overall balance assessment: Needs assistance Sitting-balance support: Feet supported;Single extremity supported Sitting balance-Leahy Scale: Poor   Postural control: Posterior lean Standing  balance support: Single extremity supported;During functional activity Standing balance-Leahy Scale: Poor Standing balance comment: L knee buckles.                    Cognition Arousal/Alertness: Awake/alert Behavior During Therapy: WFL for tasks assessed/performed Overall Cognitive Status: Within Functional Limits for tasks assessed                      Exercises      General Comments        Pertinent Vitals/Pain Pain Assessment: No/denies pain    Home Living                      Prior Function            PT Goals (current goals can now be found in the care plan section) Acute Rehab PT Goals Patient Stated Goal: Return home PT Goal Formulation: With patient/family Time For Goal Achievement: 07/20/14 Potential to Achieve Goals: Good Progress towards PT goals: Progressing toward goals    Frequency       PT Plan Discharge plan needs to be updated;Equipment recommendations need to be updated    Co-evaluation             End of Session Equipment Utilized During Treatment: Gait belt;Oxygen Activity Tolerance: Patient limited by fatigue Patient left: in chair;with call bell/phone  within reach;with family/visitor present     Time: 1030-1104 PT Time Calculation (min): 34 min  Charges:  $Therapeutic Activity: 23-37 mins                    G CodesCatarina Hartshorn, [place] 07/14/2014, 1:12 PM

## 2014-07-14 NOTE — Progress Notes (Signed)
[facility] Progress Note   [name] GUR:[id number] DOB: 1927/10/19 DOA: 07/01/2014 PCP: [name], MD  Brief narrative: [name] is a 80 y.o. male with h/o PVD, COPD on home O2, CAD, A-fib, DM 2, HTN, chronic left pleural effusion, CVA with left arm weakness, PVD- femoral artery stent and claudication of calf muscles and Macular degeneration.   The patient presents to the hospital with nausea, vomiting and diarrhea and is found to have gastric and duodenal emphysema.  He was started on broad spectrum antibiotics due to suspected gastroenteritis and managed with NG tube suctioning. As improvement was noted, tube feeds were initiated on 10/15 along with Reglan for gastroparesis however, as he did not tolerate the feeds, NG was switched back to suction on 10/17.  Nasojejunal tube placed on 10/19 by IR and tube feeds resumed.    Subjective: No complaints   Assessment/Plan: Gastric emphysema/ Gastric dysmotility:  -S/P Nasojejunal tube by IR on 10-19. Started on tube feeding 10-19.  -started tube feeding which he is tolerating well- will allow Dr Dr Cristina Gong to decide on when to start oral feeds -Keep NPO until OK with GI to resume oral feeds - Received 8 days of zosyn And 6 fluconazole.  -Holding po meds. -On schedule Reglan for gastric motility  -Surgery consult appreciated- no need for surgery at this time -Endoscopy: was normal.  -Continue with Protonix, GI prophylaxis.   Gastroenteritis  Supportive care with fluids, antiemetics and pain control  No further diarrhea since admission.   Encephalopathy - suspected to be hospital acquired delirium resolved.   Hypoglycemia:  Started on D 5 half NS with improvement- will d/c IVF as he is tolerating tube feeds now and sugars are stable  Hyponatremia/hypernatremia  Resolved- last sodium was 137   Acute renal failure:  Appears to be prerenal with severe dehydration secondary to high gastric output  -Cr peak  to 2.1. Cr trending down 0.89   Afib  on low dose scheduled IV lopressor. Po Coreg and ASA on hold  - is not on anticoagulation at home  COPD  Prn nebs   Recurrent left pleural effusion  Chronic - followed by thoracic surgery requiring thoracentesis. Non malignant effusion.   HTN  off home po meds. Iv lopressor and prn hydralazine   DM  Hold metformin. SSI (sensitive )   Hypokalemia  Replenish with K-Cl      Code Status: no intubation Family Communication: with wife at bedside Disposition Plan: SNR vs home with 24 hr supervision DVT prophylaxis: s/q heparin  Consultants: GI  Procedures: NJ tube  Antibiotics: Anti-infectives   Start     Dose/Rate Route Frequency Ordered Stop   07/02/14 1200  piperacillin-tazobactam (ZOSYN) IVPB 3.375 g  Status:  Discontinued     3.375 g 12.5 mL/hr over 240 Minutes Intravenous Every 8 hours 07/02/14 0421 07/10/14 1355   07/02/14 0430  piperacillin-tazobactam (ZOSYN) IVPB 3.375 g  Status:  Discontinued     3.375 g 100 mL/hr over 30 Minutes Intravenous  Once 07/02/14 0421 07/02/14 1625   07/02/14 0400  fluconazole (DIFLUCAN) IVPB 200 mg  Status:  Discontinued     200 mg 100 mL/hr over 60 Minutes Intravenous Every 24 hours 07/02/14 0345 07/07/14 1313   07/02/14 0000  cefTRIAXone (ROCEPHIN) 1 g in dextrose 5 % 50 mL IVPB  Status:  Discontinued     1 g 100 mL/hr over 30 Minutes Intravenous  Once 07/01/14 2354 07/01/14 2357  Objective: Filed Weights   07/12/14 0425 07/13/14 0347 07/14/14 0535  Weight: 57.2 kg (126 lb 1.7 oz) 57.6 kg (126 lb 15.8 oz) 59.4 kg (130 lb 15.3 oz)    Intake/Output Summary (Last 24 hours) at 07/14/14 1124 Last data filed at 07/14/14 0931  Gross per 24 hour  Intake      3 ml  Output   1545 ml  Net  -1542 ml     Vitals Filed Vitals:   07/13/14 1300 07/13/14 2020 07/13/14 2336 07/14/14 0535  BP: 110/61 137/80 133/80 113/62  Pulse: 85 88 93 91  Temp: 97.3 F (36.3 C) 97.6 F (36.4 C)   97.8 F (36.6 C)  TempSrc: Axillary Oral  Oral  Resp: 16 17  16   Height:      Weight:    59.4 kg (130 lb 15.3 oz)  SpO2: 98% 98%  97%    Exam: General: No acute respiratory distress Lungs: Clear to auscultation bilaterally without wheezes or crackles Cardiovascular: Regular rate and rhythm without murmur gallop or rub normal S1 and S2 Abdomen: Nontender, nondistended, soft, bowel sounds positive, no rebound, no ascites, no appreciable mass Extremities: No significant cyanosis, clubbing, or edema bilateral lower extremities  Data Reviewed: Basic Metabolic Panel:  Recent Labs Lab 07/09/14 0409 07/10/14 0403 07/11/14 1120 07/12/14 0635 07/14/14 0526  NA 141 137 132* 137 137  K 4.1 4.1 4.0 4.1 4.9  CL 99 96 93* 99 102  CO2 31 29 26 24 24   GLUCOSE 120* 133* 168* 181* 143*  BUN 32* 25* 17 14 17   CREATININE 1.13 1.12 0.98 0.89 0.74  CALCIUM 8.4 8.8 9.1 8.7 8.6   Liver Function Tests: No results found for this basename: AST, ALT, ALKPHOS, BILITOT, PROT, ALBUMIN,  in the last 168 hours No results found for this basename: LIPASE, AMYLASE,  in the last 168 hours No results found for this basename: AMMONIA,  in the last 168 hours CBC:  Recent Labs Lab 07/08/14 0315 07/09/14 0409 07/10/14 0403 07/12/14 1155  WBC 9.9 10.6* 8.9 6.5  HGB 11.3* 11.5* 11.5* 10.8*  HCT 35.4* 35.9* 35.9* 32.8*  MCV 99.7 99.7 101.4* 98.8  PLT 181 174 192 PLATELETS APPEAR ADEQUATE   Cardiac Enzymes: No results found for this basename: CKTOTAL, CKMB, CKMBINDEX, TROPONINI,  in the last 168 hours BNP (last 3 results) No results found for this basename: PROBNP,  in the last 8760 hours CBG:  Recent Labs Lab 07/13/14 2004 07/13/14 2339 07/14/14 0349 07/14/14 0531 07/14/14 0818  GLUCAP 144* 126* 127* 149* 141*    No results found for this or any previous visit (from the past 240 hour(s)).   Studies:  Recent x-ray studies have been reviewed in detail by the Attending Physician  Scheduled  Meds:  Scheduled Meds: . antiseptic oral rinse  7 mL Mouth Rinse BID  . antiseptic oral rinse  7 mL Mouth Rinse q12n4p  . chlorhexidine  15 mL Mouth Rinse BID  . feeding supplement (PRO-STAT SUGAR FREE 64)  30 mL Per Tube Q24H  . free water  90 mL Per Tube Q3H  . heparin subcutaneous  5,000 Units Subcutaneous 3 times per day  . insulin aspart  0-9 Units Subcutaneous Q4H  . metoCLOPramide (REGLAN) injection  10 mg Intravenous 4 times per day  . metoprolol  2.5 mg Intravenous 3 times per day  . pantoprazole (PROTONIX) IV  40 mg Intravenous Q12H  . sodium chloride  3 mL Intravenous Q12H  . tiotropium  18 mcg Inhalation Daily   Continuous Infusions: . feeding supplement (JEVITY 1.2 CAL) 1,000 mL (07/13/14 0200)    Time spent on care of this patient: 40 min   [place], MD 07/14/2014, 11:24 AM  LOS: 13 days   [facility] Office  [phone] Pager - Text Page per [email or web address]  If 7PM-7AM, please contact night-coverage [email or web address]

## 2014-07-15 ENCOUNTER — Inpatient Hospital Stay (HOSPITAL_COMMUNITY): Payer: Medicare Other

## 2014-07-15 ENCOUNTER — Encounter (HOSPITAL_COMMUNITY): Payer: Self-pay | Admitting: Radiology

## 2014-07-15 LAB — GLUCOSE, CAPILLARY
GLUCOSE-CAPILLARY: 134 mg/dL — AB (ref 70–99)
GLUCOSE-CAPILLARY: 127 mg/dL — AB (ref 70–99)
Glucose-Capillary: 138 mg/dL — ABNORMAL HIGH (ref 70–99)
Glucose-Capillary: 123 mg/dL — ABNORMAL HIGH (ref 70–99)
Glucose-Capillary: 109 mg/dL — ABNORMAL HIGH (ref 70–99)

## 2014-07-15 MED ORDER — IOHEXOL 300 MG/ML  SOLN
100.0000 mL | Freq: Once | INTRAMUSCULAR | Status: AC | PRN
  Administered 2014-07-15: 80 mL via INTRAVENOUS

## 2014-07-15 MED ORDER — IOHEXOL 300 MG/ML  SOLN
20.0000 mL | INTRAMUSCULAR | Status: AC
  Administered 2014-07-15: 25 mL via ORAL
  Administered 2014-07-15: 325 mL via ORAL

## 2014-07-15 MED ORDER — JEVITY 1.2 CAL PO LIQD
1000.0000 mL | ORAL | Status: DC
  Administered 2014-07-18: 21:00:00
  Administered 2014-07-18: 1000 mL
  Filled 2014-07-15 (×10): qty 1000

## 2014-07-15 NOTE — Progress Notes (Signed)
[facility] Progress Note   [name] [id number]:[id number] DOB: 09-19-1928 DOA: 07/01/2014 PCP: [name], MD  Brief narrative: [name] is a 80 y.o. male with h/o PVD, COPD on home O2, CAD, A-fib, DM 2, HTN, chronic left pleural effusion, CVA with left arm weakness, PVD- femoral artery stent and claudication of calf muscles and Macular degeneration.   The patient presents to the hospital with nausea, vomiting and diarrhea and is found to have gastric and duodenal emphysema.  He was started on broad spectrum antibiotics due to suspected gastroenteritis and managed with NG tube suctioning. As improvement was noted, tube feeds were initiated on 10/15 along with Reglan for gastroparesis however, as he did not tolerate the feeds, NG was switched back to suction on 10/17.  Nasojejunal tube placed on 10/19 by IR and tube feeds resumed.    Subjective: No complaints   Assessment/Plan: Gastric emphysema/ Gastric dysmotility:  -S/P Nasojejunal tube by IR on 10-19. Started on tube feeding 10-19.  -started tube feeding which he is tolerating well- -Keep NPO until OK with GI to resume oral feeds- per GI, most likely will need to wait 1 wk prior to resuming oral feeds - have asked nutrition to try to keep off of tube feeds for 6 hrs a day to allow him to ambulate in the hall and prevent deconditioning  - Received 8 days of zosyn And 6 fluconazole.  -Holding po meds. -On schedule Reglan for gastric motility  -Surgery consult appreciated- no need for surgery at this time -Endoscopy: was normal.  -Continue with Protonix, GI prophylaxis.   Gastroenteritis  Supportive care with fluids, antiemetics and pain control  No further diarrhea since admission.   Encephalopathy - suspected to be hospital acquired delirium resolved.   Hypoglycemia:  Started on D 5 half NS with improvement- will d/c IVF as he is tolerating tube feeds now and sugars are stable  Hyponatremia/hypernatremia   Resolved- last sodium was 137   Acute renal failure:  Appears to be prerenal with severe dehydration secondary to high gastric output  -Cr peak to 2.1. Cr trending down 0.89   Afib  on low dose scheduled IV lopressor. Po Coreg and ASA on hold  - is not on anticoagulation at home  COPD  Prn nebs   Recurrent left pleural effusion  Chronic - followed by thoracic surgery requiring thoracentesis. Non malignant effusion.   HTN  off home po meds. Iv lopressor and prn hydralazine   DM  Hold metformin. SSI (sensitive )   Hypokalemia  Replenish with KCl    Code Status: no intubation Family Communication: with wife at bedside Disposition Plan: SNR vs home with 24 hr supervision DVT prophylaxis: s/q heparin  Consultants: GI  Procedures: NJ tube  Antibiotics: Anti-infectives   Start     Dose/Rate Route Frequency Ordered Stop   07/02/14 1200  piperacillin-tazobactam (ZOSYN) IVPB 3.375 g  Status:  Discontinued     3.375 g 12.5 mL/hr over 240 Minutes Intravenous Every 8 hours 07/02/14 0421 07/10/14 1355   07/02/14 0430  piperacillin-tazobactam (ZOSYN) IVPB 3.375 g  Status:  Discontinued     3.375 g 100 mL/hr over 30 Minutes Intravenous  Once 07/02/14 0421 07/02/14 1625   07/02/14 0400  fluconazole (DIFLUCAN) IVPB 200 mg  Status:  Discontinued     200 mg 100 mL/hr over 60 Minutes Intravenous Every 24 hours 07/02/14 0345 07/07/14 1313   07/02/14 0000  cefTRIAXone (ROCEPHIN) 1 g in dextrose 5 % 50  mL IVPB  Status:  Discontinued     1 g 100 mL/hr over 30 Minutes Intravenous  Once 07/01/14 2354 07/01/14 2357         Objective: Filed Weights   07/13/14 0347 07/14/14 0535 07/15/14 0446  Weight: 57.6 kg (126 lb 15.8 oz) 59.4 kg (130 lb 15.3 oz) 60.4 kg (133 lb 2.5 oz)    Intake/Output Summary (Last 24 hours) at 07/15/14 1306 Last data filed at 07/15/14 1117  Gross per 24 hour  Intake   1380 ml  Output   1875 ml  Net   -495 ml     Vitals Filed Vitals:   07/14/14  1959 07/14/14 2300 07/15/14 0446 07/15/14 0926  BP: 120/61 111/61 120/67 110/52  Pulse: 81 81 90 71  Temp: 97.3 F (36.3 C)  97.5 F (36.4 C) 98.4 F (36.9 C)  TempSrc: Oral  Oral Oral  Resp: 18  18 18   Height:      Weight:   60.4 kg (133 lb 2.5 oz)   SpO2: 100% 98% 98% 98%    Exam: General: No acute respiratory distress Lungs: Clear to auscultation bilaterally without wheezes or crackles Cardiovascular: Regular rate and rhythm without murmur gallop or rub normal S1 and S2 Abdomen: Nontender, nondistended, soft, bowel sounds positive, no rebound, no ascites, no appreciable mass Extremities: No significant cyanosis, clubbing, or edema bilateral lower extremities  Data Reviewed: Basic Metabolic Panel:  Recent Labs Lab 07/09/14 0409 07/10/14 0403 07/11/14 1120 07/12/14 0635 07/14/14 0526  NA 141 137 132* 137 137  K 4.1 4.1 4.0 4.1 4.9  CL 99 96 93* 99 102  CO2 31 29 26 24 24   GLUCOSE 120* 133* 168* 181* 143*  BUN 32* 25* 17 14 17   CREATININE 1.13 1.12 0.98 0.89 0.74  CALCIUM 8.4 8.8 9.1 8.7 8.6   Liver Function Tests: No results found for this basename: AST, ALT, ALKPHOS, BILITOT, PROT, ALBUMIN,  in the last 168 hours No results found for this basename: LIPASE, AMYLASE,  in the last 168 hours No results found for this basename: AMMONIA,  in the last 168 hours CBC:  Recent Labs Lab 07/09/14 0409 07/10/14 0403 07/12/14 1155  WBC 10.6* 8.9 6.5  HGB 11.5* 11.5* 10.8*  HCT 35.9* 35.9* 32.8*  MCV 99.7 101.4* 98.8  PLT 174 192 PLATELETS APPEAR ADEQUATE   Cardiac Enzymes: No results found for this basename: CKTOTAL, CKMB, CKMBINDEX, TROPONINI,  in the last 168 hours BNP (last 3 results) No results found for this basename: PROBNP,  in the last 8760 hours CBG:  Recent Labs Lab 07/14/14 2324 07/14/14 2358 07/15/14 0442 07/15/14 0759 07/15/14 1121  GLUCAP 106* 106* 134* 138* 123*    No results found for this or any previous visit (from the past 240 hour(s)).    Studies:  Recent x-ray studies have been reviewed in detail by the Attending Physician  Scheduled Meds:  Scheduled Meds: . antiseptic oral rinse  7 mL Mouth Rinse BID  . antiseptic oral rinse  7 mL Mouth Rinse q12n4p  . chlorhexidine  15 mL Mouth Rinse BID  . feeding supplement (PRO-STAT SUGAR FREE 64)  30 mL Per Tube Q24H  . free water  90 mL Per Tube Q3H  . heparin subcutaneous  5,000 Units Subcutaneous 3 times per day  . insulin aspart  0-9 Units Subcutaneous Q4H  . metoCLOPramide (REGLAN) injection  10 mg Intravenous 4 times per day  . metoprolol  2.5 mg Intravenous 3 times per  day  . pantoprazole (PROTONIX) IV  40 mg Intravenous Q12H  . sodium chloride  3 mL Intravenous Q12H  . tiotropium  18 mcg Inhalation Daily   Continuous Infusions: . feeding supplement (JEVITY 1.2 CAL)      Time spent on care of this patient: 30 min   [place], MD 07/15/2014, 1:06 PM  LOS: 14 days   [facility] Office  [phone] Pager - Text Page per [email or web address]  If 7PM-7AM, please contact night-coverage [email or web address]

## 2014-07-15 NOTE — Progress Notes (Signed)
NUTRITION FOLLOW UP/CONSULT  Intervention:   Increase Jevity 1.2 to 70 ml/hr via panda NJ tube and continue to increase by 10 ml/hr every 4 hours to goal rate of 80 ml/hr. Run TF for 18 hours daily. Continue 30 ml Pro-stat via tube daily. Tube feeding regimen will provide 1828 kcal (100% of needs), 95 grams of protein, and 1166 ml of H2O.  Continue 90 ml free water flushes every 3 hours to provide and additional 720 ml daily and a total of 1886 ml per 24 hours.   Nutrition Dx:   1) Increased nutrient needs related to chronic illness as evidenced by estimated nutrition needs; ongoing  2) Inadequate oral intake related to inability to eat as evidenced by NPO status; ongoing  Goal:   Pt to meet >/= 90% of their estimated nutrition needs; ongoing  Monitor:   TF rate/tolerance, diet advancement/PO intake, weight trend, labs, I/O's  Assessment:   Pt with PMH of Dysrhythmia; HTN; SOB; Claudication of calf muscles; GERD; TIA ; Peripheral vascular disease; On home oxygen therapy; Neuromuscular disorder; Macular degeneration, bilateral; Coronary artery disease; COPD; T2DM; Stroke; Arthritis; and Pneumonia. Presented with 2 day hx of nausea vomiting and diarrhea.  Pt is receiving Jevity 1.2 @ goal rate of 60 ml/hr via NJ tube. NG tube in place but, no longer to suction. Per RN pt is tolerating feedings well.  RD consulted to adjust tube feed so that the patient can be free of the pump for about 6 hrs a day- would prefer he be off 3 hrs in the AM and 3 hrs in the late afternoon. Will gradually increase rate to 80 ml/hr to infuse for 18 hours daily to continue to provide the same calories and protein.   Pt has started to re-gain weight with a 5 lb weight increase in the past 2 days.  Glucose has improved significantly over the past couple days; now ranging from 106 to 149 mg/dL I/O: +6.2 L Labs reviewed.    Height: Ht Readings from Last 1 Encounters:  07/02/14 5\' 6"  (1.676 m)    Weight Status:    Wt Readings from Last 1 Encounters:  07/15/14 133 lb 2.5 oz (60.4 kg)  07/13/14 126 lb 07/08/14 132 lb 07/02/14 138 lb  Re-estimated needs:  Kcal: 1700-1900  Protein: 90-100 gm  Fluid: 1.6-1.8 L  Skin: Stage I pressure ulcer on sacrum; non-pitting RLE and LLE edema  Diet Order: NPO   Intake/Output Summary (Last 24 hours) at 07/15/14 1216 Last data filed at 07/15/14 1117  Gross per 24 hour  Intake   1380 ml  Output   1875 ml  Net   -495 ml    Last BM: 10/22   Labs:   Recent Labs Lab 07/11/14 1120 07/12/14 0635 07/14/14 0526  NA 132* 137 137  K 4.0 4.1 4.9  CL 93* 99 102  CO2 26 24 24   BUN 17 14 17   CREATININE 0.98 0.89 0.74  CALCIUM 9.1 8.7 8.6  GLUCOSE 168* 181* 143*    CBG (last 3)   Recent Labs  07/15/14 0442 07/15/14 0759 07/15/14 1121  GLUCAP 134* 138* 123*    Scheduled Meds: . antiseptic oral rinse  7 mL Mouth Rinse BID  . antiseptic oral rinse  7 mL Mouth Rinse q12n4p  . chlorhexidine  15 mL Mouth Rinse BID  . feeding supplement (PRO-STAT SUGAR FREE 64)  30 mL Per Tube Q24H  . free water  90 mL Per Tube Q3H  .  heparin subcutaneous  5,000 Units Subcutaneous 3 times per day  . insulin aspart  0-9 Units Subcutaneous Q4H  . metoCLOPramide (REGLAN) injection  10 mg Intravenous 4 times per day  . metoprolol  2.5 mg Intravenous 3 times per day  . pantoprazole (PROTONIX) IV  40 mg Intravenous Q12H  . sodium chloride  3 mL Intravenous Q12H  . tiotropium  18 mcg Inhalation Daily    Continuous Infusions: . feeding supplement (JEVITY 1.2 CAL) 60 mL/hr at 07/14/14 [place], LDN Inpatient Clinical Dietitian Pager: [phone] After Hours Pager: [phone]

## 2014-07-15 NOTE — Progress Notes (Signed)
Patient seen and examined. 25 minutes conversation on the telephone coordinating care with the patient's attending hospitalist and the patient's wife and daughter.  The patient denies abdominal pain or significant nausea (he indicates that there has been brief, fleeting nausea). No vomiting.  He is at the target rate for his tube feeding, 60 mL per hour, delivered into the small bowel distal to the ligament of Treitz.  Abdomen has no succussion splash, no distention, no tenderness in the epigastric area.  Labs are stable as of yesterday. Renal function is normal.  Impression: We have a controlled situation at this time, with patient receiving adequate nutritional support, and with no evident accumulation of any symptomatic gastric residual.   However, given his previous massive gastric dilatation and the moderately large 300 mL residual at the time we discontinued his NG tube 2 days ago, I do think he has it at least some risk for reaccumulation of a gastric residual, with the potential for vomiting and/or aspiration.  Another issue is placement. With a nasoduodenal tube, he is unlikely to be able to be moved to long-term facilities such as [facility], the rehabilitation unit, or a skilled nursing facility.  Recommendation:  1. Obtained updated CT scan of the stomach to verify that his gastric mural pneumatosis has resolved, and to verify the absence of any significant reaccumulation of fluid within the stomach lumen. It is recalled that his endoscopy, performed several days after the CT that showed pneumatosis, showed a completely normal mucosal appearance of the stomach.  2. Assuming that the CT scan looks okay, I would advocate interventional radiology placing a PEG/PEJ tube early next week. This would allow long-term nutritional support and, if needed, the ability to provide gastric decompression.  3. As I explained to the patient's wife and daughter, it is unclear whether, when, and  to what degree he will regain gastric function to the point where he can nourish himself adequately orally. Permanent or long-term nutrition support may or may not be needed, but at least it would be easy to remove the PEG/PEJ tube if it was no longer needed, and in the meantime, it would provide the feeding and decompression functions described above, plus it would enable his placement in a more appropriate care facility such as a long-term hospital.   4. Dr. Clarene Essex is covering for [place] this weekend. I will ask him to review the CT results with the patient's wife, Cori Razor [phone]) and to arrange for an IR consult early next week if all are in agreement.  Cleotis Nipper, M.D. [phone]

## 2014-07-15 NOTE — Progress Notes (Signed)
Patient complained of chest pain 8/10 which then eased off to 5/10. Administered 2mg  morphine IV, patient states pain is now 0/10. EKG performed, patient is Afib as his previous baseline. MD paged to notify. VSS.

## 2014-07-16 LAB — GLUCOSE, CAPILLARY
GLUCOSE-CAPILLARY: 144 mg/dL — AB (ref 70–99)
GLUCOSE-CAPILLARY: 148 mg/dL — AB (ref 70–99)
GLUCOSE-CAPILLARY: 82 mg/dL (ref 70–99)
Glucose-Capillary: 100 mg/dL — ABNORMAL HIGH (ref 70–99)
Glucose-Capillary: 169 mg/dL — ABNORMAL HIGH (ref 70–99)
Glucose-Capillary: 120 mg/dL — ABNORMAL HIGH (ref 70–99)

## 2014-07-16 MED ORDER — MUSCLE RUB 10-15 % EX CREA
TOPICAL_CREAM | CUTANEOUS | Status: DC | PRN
  Administered 2014-07-16 – 2014-07-17 (×2): via TOPICAL
  Filled 2014-07-16: qty 85

## 2014-07-16 NOTE — Progress Notes (Signed)
[facility] Progress Note   [name] NUU:[id number] DOB: 02-Jan-1928 DOA: 07/01/2014 PCP: [name], MD  Brief narrative: [name] is a 80 y.o. male with h/o PVD, COPD on home O2, CAD, A-fib, DM 2, HTN, chronic left pleural effusion, CVA with left arm weakness, PVD- femoral artery stent and claudication of calf muscles and Macular degeneration.   The patient presents to the hospital with nausea, vomiting and diarrhea and is found to have gastric and duodenal emphysema.  He was started on broad spectrum antibiotics due to suspected gastroenteritis and managed with NG tube suctioning. As improvement was noted, tube feeds were initiated on 10/15 along with Reglan for gastroparesis however, as he did not tolerate the feeds, NG was switched back to suction on 10/17.  Nasojejunal tube placed on 10/19 by IR and tube feeds resumed.    Subjective: No complaints   Assessment/Plan: Gastric emphysema/ Gastric dysmotility:  -S/P Nasojejunal tube by IR on 10-19. Started on tube feeding 10-19 which he has been tolerating well - OK to give a trial of clears today per GI - have asked nutrition to try to keep off of tube feeds for 6 hrs a day to allow him to ambulate in the hall and prevent deconditioning  - Received 8 days of zosyn And 6 fluconazole.  -Holding po meds. -On schedule Reglan for gastric motility  -Surgery consult appreciated- no need for surgery at this time -Endoscopy: was normal.  -Continue with Protonix, GI prophylaxis.   Gastroenteritis  Supportive care with fluids, antiemetics and pain control  No further diarrhea since admission.   Encephalopathy - suspected to be hospital acquired delirium resolved.   Hypoglycemia:  Started on D 5 half NS with improvement- have d/c'd IVF as he is tolerating tube feeds now and sugars are stable  Hyponatremia/hypernatremia  Resolved- last sodium was 137   Acute renal failure:  Appears to have been prerenal with  severe dehydration secondary to high gastric output  -Cr peak to 2.1 and improved to normal after hydrating  Afib  on low dose scheduled IV lopressor. Po Coreg and ASA on hold  - is not on anticoagulation at home  COPD  Prn nebs   Recurrent left pleural effusion  Chronic - followed by thoracic surgery requiring thoracentesis. Non malignant effusion.   HTN  off home po meds. Iv lopressor and prn hydralazine   DM  Hold metformin. SSI (sensitive )   Hypokalemia  Replenish with KCl    Code Status: no intubation Family Communication: with wife at bedside Disposition Plan: SNF vs home with 24 hr supervision DVT prophylaxis: s/q heparin  Consultants: GI  Procedures: NJ tube  Antibiotics: Anti-infectives   Start     Dose/Rate Route Frequency Ordered Stop   07/02/14 1200  piperacillin-tazobactam (ZOSYN) IVPB 3.375 g  Status:  Discontinued     3.375 g 12.5 mL/hr over 240 Minutes Intravenous Every 8 hours 07/02/14 0421 07/10/14 1355   07/02/14 0430  piperacillin-tazobactam (ZOSYN) IVPB 3.375 g  Status:  Discontinued     3.375 g 100 mL/hr over 30 Minutes Intravenous  Once 07/02/14 0421 07/02/14 1625   07/02/14 0400  fluconazole (DIFLUCAN) IVPB 200 mg  Status:  Discontinued     200 mg 100 mL/hr over 60 Minutes Intravenous Every 24 hours 07/02/14 0345 07/07/14 1313   07/02/14 0000  cefTRIAXone (ROCEPHIN) 1 g in dextrose 5 % 50 mL IVPB  Status:  Discontinued     1 g 100 mL/hr over  30 Minutes Intravenous  Once 07/01/14 2354 07/01/14 2357         Objective: Filed Weights   07/14/14 0535 07/15/14 0446 07/16/14 0522  Weight: 59.4 kg (130 lb 15.3 oz) 60.4 kg (133 lb 2.5 oz) 59.91 kg (132 lb 1.2 oz)    Intake/Output Summary (Last 24 hours) at 07/16/14 1010 Last data filed at 07/16/14 0733  Gross per 24 hour  Intake   1918 ml  Output   2100 ml  Net   -182 ml     Vitals Filed Vitals:   07/15/14 0926 07/15/14 1425 07/15/14 2100 07/16/14 0522  BP: 110/52 145/75 137/65  128/64  Pulse: 71 83 78 72  Temp: 98.4 F (36.9 C) 98.1 F (36.7 C) 98 F (36.7 C) 98 F (36.7 C)  TempSrc: Oral Oral Oral Oral  Resp: 18  18 20   Height:      Weight:    59.91 kg (132 lb 1.2 oz)  SpO2: 98% 100% 100% 100%    Exam: General: No acute respiratory distress Lungs: Clear to auscultation bilaterally without wheezes or crackles Cardiovascular: Regular rate and rhythm without murmur gallop or rub normal S1 and S2 Abdomen: Nontender, nondistended, soft, bowel sounds positive, no rebound, no ascites, no appreciable mass Extremities: No significant cyanosis, clubbing, or edema bilateral lower extremities  Data Reviewed: Basic Metabolic Panel:  Recent Labs Lab 07/10/14 0403 07/11/14 1120 07/12/14 0635 07/14/14 0526  NA 137 132* 137 137  K 4.1 4.0 4.1 4.9  CL 96 93* 99 102  CO2 29 26 24 24   GLUCOSE 133* 168* 181* 143*  BUN 25* 17 14 17   CREATININE 1.12 0.98 0.89 0.74  CALCIUM 8.8 9.1 8.7 8.6   Liver Function Tests: No results found for this basename: AST, ALT, ALKPHOS, BILITOT, PROT, ALBUMIN,  in the last 168 hours No results found for this basename: LIPASE, AMYLASE,  in the last 168 hours No results found for this basename: AMMONIA,  in the last 168 hours CBC:  Recent Labs Lab 07/10/14 0403 07/12/14 1155  WBC 8.9 6.5  HGB 11.5* 10.8*  HCT 35.9* 32.8*  MCV 101.4* 98.8  PLT 192 PLATELETS APPEAR ADEQUATE   Cardiac Enzymes: No results found for this basename: CKTOTAL, CKMB, CKMBINDEX, TROPONINI,  in the last 168 hours BNP (last 3 results) No results found for this basename: PROBNP,  in the last 8760 hours CBG:  Recent Labs Lab 07/15/14 1623 07/15/14 1955 07/16/14 0002 07/16/14 0427 07/16/14 0812  GLUCAP 127* 109* 82 144* 148*    No results found for this or any previous visit (from the past 240 hour(s)).   Studies:  Recent x-ray studies have been reviewed in detail by the Attending Physician  Scheduled Meds:  Scheduled Meds: . antiseptic  oral rinse  7 mL Mouth Rinse BID  . antiseptic oral rinse  7 mL Mouth Rinse q12n4p  . chlorhexidine  15 mL Mouth Rinse BID  . feeding supplement (PRO-STAT SUGAR FREE 64)  30 mL Per Tube Q24H  . free water  90 mL Per Tube Q3H  . heparin subcutaneous  5,000 Units Subcutaneous 3 times per day  . insulin aspart  0-9 Units Subcutaneous Q4H  . metoCLOPramide (REGLAN) injection  10 mg Intravenous 4 times per day  . metoprolol  2.5 mg Intravenous 3 times per day  . pantoprazole (PROTONIX) IV  40 mg Intravenous Q12H  . sodium chloride  3 mL Intravenous Q12H  . tiotropium  18 mcg Inhalation Daily  Continuous Infusions: . feeding supplement (JEVITY 1.2 CAL) Stopped (07/16/14 0900)    Time spent on care of this patient: 30 min   [place], MD 07/16/2014, 10:10 AM  LOS: 15 days   [facility] Office  [phone] Pager - Text Page per [email or web address]  If 7PM-7AM, please contact night-coverage [email or web address]

## 2014-07-16 NOTE — Progress Notes (Signed)
[name] 9:38 AM  Subjective: Patient looks better than his story sounded per my partner in his case was discussed with his wife and the hospital team and his hospital computer chart and office computer chart was briefly reviewed and he has no new complaints Objective: Vital signs stable afebrile no acute distress abdomen is soft nontender CT improved  Assessment: Multiple medical problem including questionable gastroparesis  Plan: Okay to try to slowly advance diet but if he fails consider either nuclear gastric emptying study or PEG J. per interventional radiology  [facility] E

## 2014-07-17 LAB — GLUCOSE, CAPILLARY
GLUCOSE-CAPILLARY: 174 mg/dL — AB (ref 70–99)
GLUCOSE-CAPILLARY: 95 mg/dL (ref 70–99)
Glucose-Capillary: 130 mg/dL — ABNORMAL HIGH (ref 70–99)
Glucose-Capillary: 135 mg/dL — ABNORMAL HIGH (ref 70–99)
Glucose-Capillary: 154 mg/dL — ABNORMAL HIGH (ref 70–99)
Glucose-Capillary: 157 mg/dL — ABNORMAL HIGH (ref 70–99)

## 2014-07-17 NOTE — Progress Notes (Signed)
[name] 10:57 AM  Subjective: Patient did well with clear liquids and has no new complaints and he and his wife feel like he can try heavier things  Objective: Vital signs stable afebrile distress abdomen is soft nontender  Assessment: Feeding problem  Plan: We'll allow full liquids and will slowly advance and hopefully we can hold off on a PEG J and consider nutritional consult or possibly a gastric emptying study in the future  [facility]

## 2014-07-17 NOTE — Progress Notes (Signed)
[facility] Progress Note   Stanton Kissoon Garin OLM:[id number] DOB: 1928-01-16 DOA: 07/01/2014 PCP: [name], MD  Brief narrative: [name] is a 80 y.o. male with h/o PVD, COPD on home O2, CAD, A-fib, DM 2, HTN, chronic left pleural effusion, CVA with left arm weakness, PVD- femoral artery stent and claudication of calf muscles and Macular degeneration.   The patient presents to the hospital with nausea, vomiting and diarrhea and is found to have gastric and duodenal emphysema.  He was started on broad spectrum antibiotics due to suspected gastroenteritis and managed with NG tube suctioning. As improvement was noted, tube feeds were initiated on 10/15 along with Reglan for gastroparesis however, as he did not tolerate the feeds, NG was switched back to suction on 10/17.  Nasojejunal tube placed on 10/19 by IR and tube feeds resumed.    Subjective: No complaints - tolerating clear liquids without nausea or vomiting  Assessment/Plan: Gastric emphysema/ Gastric dysmotility:  -S/P Nasojejunal tube by IR on 10-19. Started on tube feeding 10-19 which he has been tolerating well - GI advance to full liquids today - have asked nutrition to try to keep off of tube feeds for 6 hrs a day to allow him to ambulate in the hall and prevent deconditioning  - Received 8 days of zosyn And 6 fluconazole.  -Holding po meds. -On schedule Reglan for gastric motility  -Surgery consult appreciated- no need for surgery at this time -Endoscopy: was normal.  -Continue with Protonix, GI prophylaxis.   Gastroenteritis  Supportive care with fluids, antiemetics and pain control  No further diarrhea since admission.   Encephalopathy - suspected to be hospital acquired delirium resolved.   Hypoglycemia:  Started on D 5 half NS with improvement- have d/c'd IVF as he is tolerating tube feeds now and sugars are stable  Hyponatremia/hypernatremia  Resolved- last sodium was 137   Acute renal  failure:  Appears to have been prerenal with severe dehydration secondary to high gastric output  -Cr peak to 2.1 and improved to normal after hydrating  Afib  on low dose scheduled IV lopressor. Po Coreg and ASA on hold  - is not on anticoagulation at home  COPD  Prn nebs   Recurrent left pleural effusion  Chronic - followed by thoracic surgery requiring thoracentesis. Non malignant effusion.   HTN  off home po meds. Iv lopressor and prn hydralazine   DM  Hold metformin. SSI (sensitive )   Hypokalemia  Replenish with KCl    Code Status: no intubation Family Communication: with wife at bedside Disposition Plan: SNF vs home with 24 hr supervision DVT prophylaxis: s/q heparin  Consultants: GI  Procedures: NJ tube  Antibiotics: Anti-infectives   Start     Dose/Rate Route Frequency Ordered Stop   07/02/14 1200  piperacillin-tazobactam (ZOSYN) IVPB 3.375 g  Status:  Discontinued     3.375 g 12.5 mL/hr over 240 Minutes Intravenous Every 8 hours 07/02/14 0421 07/10/14 1355   07/02/14 0430  piperacillin-tazobactam (ZOSYN) IVPB 3.375 g  Status:  Discontinued     3.375 g 100 mL/hr over 30 Minutes Intravenous  Once 07/02/14 0421 07/02/14 1625   07/02/14 0400  fluconazole (DIFLUCAN) IVPB 200 mg  Status:  Discontinued     200 mg 100 mL/hr over 60 Minutes Intravenous Every 24 hours 07/02/14 0345 07/07/14 1313   07/02/14 0000  cefTRIAXone (ROCEPHIN) 1 g in dextrose 5 % 50 mL IVPB  Status:  Discontinued     1 g  100 mL/hr over 30 Minutes Intravenous  Once 07/01/14 2354 07/01/14 2357         Objective: Filed Weights   07/15/14 0446 07/16/14 0522 07/17/14 0444  Weight: 60.4 kg (133 lb 2.5 oz) 59.91 kg (132 lb 1.2 oz) 59.1 kg (130 lb 4.7 oz)    Intake/Output Summary (Last 24 hours) at 07/17/14 1513 Last data filed at 07/17/14 1322  Gross per 24 hour  Intake    720 ml  Output   1650 ml  Net   -930 ml     Vitals Filed Vitals:   07/16/14 2000 07/17/14 0444 07/17/14  0736 07/17/14 1413  BP: 129/63 116/64  117/68  Pulse: 79 87  87  Temp: 97.9 F (36.6 C) 97.3 F (36.3 C)  97.8 F (36.6 C)  TempSrc: Oral Oral  Axillary  Resp: 20 20  20   Height:      Weight:  59.1 kg (130 lb 4.7 oz)    SpO2: 98% 95% 95% 97%    Exam: General: No acute respiratory distress Lungs: Clear to auscultation bilaterally without wheezes or crackles Cardiovascular: Regular rate and rhythm without murmur gallop or rub normal S1 and S2 Abdomen: Nontender, nondistended, soft, bowel sounds positive, no rebound, no ascites, no appreciable mass Extremities: No significant cyanosis, clubbing, or edema bilateral lower extremities  Data Reviewed: Basic Metabolic Panel:  Recent Labs Lab 07/11/14 1120 07/12/14 0635 07/14/14 0526  NA 132* 137 137  K 4.0 4.1 4.9  CL 93* 99 102  CO2 26 24 24   GLUCOSE 168* 181* 143*  BUN 17 14 17   CREATININE 0.98 0.89 0.74  CALCIUM 9.1 8.7 8.6   Liver Function Tests: No results found for this basename: AST, ALT, ALKPHOS, BILITOT, PROT, ALBUMIN,  in the last 168 hours No results found for this basename: LIPASE, AMYLASE,  in the last 168 hours No results found for this basename: AMMONIA,  in the last 168 hours CBC:  Recent Labs Lab 07/12/14 1155  WBC 6.5  HGB 10.8*  HCT 32.8*  MCV 98.8  PLT PLATELETS APPEAR ADEQUATE   Cardiac Enzymes: No results found for this basename: CKTOTAL, CKMB, CKMBINDEX, TROPONINI,  in the last 168 hours BNP (last 3 results) No results found for this basename: PROBNP,  in the last 8760 hours CBG:  Recent Labs Lab 07/16/14 2002 07/17/14 0007 07/17/14 0428 07/17/14 0910 07/17/14 1252  GLUCAP 120* 157* 135* 154* 130*    No results found for this or any previous visit (from the past 240 hour(s)).   Studies:  Recent x-ray studies have been reviewed in detail by the Attending Physician  Scheduled Meds:  Scheduled Meds: . antiseptic oral rinse  7 mL Mouth Rinse BID  . antiseptic oral rinse  7 mL  Mouth Rinse q12n4p  . chlorhexidine  15 mL Mouth Rinse BID  . feeding supplement (PRO-STAT SUGAR FREE 64)  30 mL Per Tube Q24H  . free water  90 mL Per Tube Q3H  . heparin subcutaneous  5,000 Units Subcutaneous 3 times per day  . insulin aspart  0-9 Units Subcutaneous Q4H  . metoCLOPramide (REGLAN) injection  10 mg Intravenous 4 times per day  . metoprolol  2.5 mg Intravenous 3 times per day  . pantoprazole (PROTONIX) IV  40 mg Intravenous Q12H  . sodium chloride  3 mL Intravenous Q12H  . tiotropium  18 mcg Inhalation Daily   Continuous Infusions: . feeding supplement (JEVITY 1.2 CAL) Stopped (07/16/14 1800)    Time  spent on care of this patient: 23 min   [place], MD 07/17/2014, 3:13 PM  LOS: 16 days   [facility] Office  [phone] Pager - Text Page per [email or web address]  If 7PM-7AM, please contact night-coverage [email or web address]

## 2014-07-18 LAB — BASIC METABOLIC PANEL
Anion gap: 10 (ref 5–15)
BUN: 34 mg/dL — ABNORMAL HIGH (ref 6–23)
CALCIUM: 9 mg/dL (ref 8.4–10.5)
CHLORIDE: 95 meq/L — AB (ref 96–112)
CO2: 29 meq/L (ref 19–32)
CREATININE: 0.89 mg/dL (ref 0.50–1.35)
GFR calc Af Amer: 89 mL/min — ABNORMAL LOW (ref >90)
GFR calc non Af Amer: 76 mL/min — ABNORMAL LOW (ref >90)
Glucose, Bld: 158 mg/dL — ABNORMAL HIGH (ref 70–99)
Potassium: 5.6 mEq/L — ABNORMAL HIGH (ref 3.7–5.3)
Sodium: 134 mEq/L — ABNORMAL LOW (ref 137–147)

## 2014-07-18 LAB — GLUCOSE, CAPILLARY
GLUCOSE-CAPILLARY: 193 mg/dL — AB (ref 70–99)
Glucose-Capillary: 101 mg/dL — ABNORMAL HIGH (ref 70–99)
Glucose-Capillary: 119 mg/dL — ABNORMAL HIGH (ref 70–99)
Glucose-Capillary: 154 mg/dL — ABNORMAL HIGH (ref 70–99)
Glucose-Capillary: 164 mg/dL — ABNORMAL HIGH (ref 70–99)
Glucose-Capillary: 165 mg/dL — ABNORMAL HIGH (ref 70–99)
Glucose-Capillary: 89 mg/dL (ref 70–99)

## 2014-07-18 LAB — CLOSTRIDIUM DIFFICILE BY PCR: Toxigenic C. Difficile by PCR: NEGATIVE

## 2014-07-18 MED ORDER — ENSURE COMPLETE PO LIQD
237.0000 mL | Freq: Two times a day (BID) | ORAL | Status: DC
  Administered 2014-07-18 – 2014-07-19 (×3): 237 mL via ORAL

## 2014-07-18 MED ORDER — PRO-STAT SUGAR FREE PO LIQD
30.0000 mL | ORAL | Status: DC
  Administered 2014-07-18 – 2014-07-21 (×4): 30 mL
  Filled 2014-07-18 (×5): qty 30

## 2014-07-18 NOTE — Progress Notes (Signed)
Physical Therapy Treatment Patient Details Name: [name] MRN: [id number] DOB: Jul 27, 1928 Today's Date: 07/18/2014    History of Present Illness Pt is an 80 y/o male admitted with an abnormal GI x-ray. Pt had an EGD 07/06/14 and was diagnosed with acute gastric distention, gastric pneumatosis, gastroparesis, and a motility disorder.    PT Comments    Pt was able to assist with transfer to chair with CNA assisting to safely move pt.  His wife is asking about SNF care and pt is still requiring assist of 2 for sidesteps and to manage dense LUE weakness.  PLOF is in dependent gait around the house, and therefore he is appropriate for short stay to increase strength and safety.  Follow Up Recommendations  Supervision/Assistance - 24 hour;SNF     Equipment Recommendations  3in1 (PT)    Recommendations for Other Services       Precautions / Restrictions Precautions Precautions: Fall Restrictions Weight Bearing Restrictions: No    Mobility  Bed Mobility Overal bed mobility: Needs Assistance Bed Mobility: Supine to Sit     Supine to sit: Mod assist     General bed mobility comments: uses bed r ail and needs sequencing assistance  Transfers Overall transfer level: Needs assistance Equipment used: 2 person hand held assist Transfers: Sit to/from Stand;Stand Pivot Transfers Sit to Stand: Mod assist Stand pivot transfers: +2 physical assistance;Mod assist       General transfer comment: reminders for sequencing and to stand upright, then to step with PT and nursing  Ambulation/Gait             General Gait Details: Deferred due to pt fatigue.  (and pain L knee)   Stairs            Wheelchair Mobility    Modified Rankin (Stroke Patients Only)       Balance Overall balance assessment: Needs assistance Sitting-balance support: Feet supported;Single extremity supported Sitting balance-Leahy Scale: Fair   Postural control: Posterior lean;Right  lateral lean Standing balance support: Bilateral upper extremity supported Standing balance-Leahy Scale: Poor Standing balance comment: Requires complete LUE support and cues to control                    Cognition Arousal/Alertness: Awake/alert Behavior During Therapy: WFL for tasks assessed/performed Overall Cognitive Status: Within Functional Limits for tasks assessed                      Exercises General Exercises - Lower Extremity Ankle Circles/Pumps: AROM;Both;10 reps Long Arc Quad: Strengthening;Both;10 reps Heel Slides: Strengthening;Both;10 reps Hip ABduction/ADduction: Strengthening;Both;10 reps Straight Leg Raises: AAROM;Both;10 reps Hip Flexion/Marching: AROM;Both;10 reps    General Comments General comments (skin integrity, edema, etc.): Pt is alert and motivated to work with better control of LLE once standing.      Pertinent Vitals/Pain Pain Assessment: Faces Faces Pain Scale: Hurts little more Pain Location: L leg and ARM Pain Intervention(s): Limited activity within patient's tolerance;Monitored during session;Premedicated before session;Repositioned;Other (comment) (Pt can only step short distance due to L knee gouty pain)    Home Living                      Prior Function            PT Goals (current goals can now be found in the care plan section) Acute Rehab PT Goals Patient Stated Goal: To go home Progress towards PT goals: Progressing toward goals  Frequency  Min 2X/week    PT Plan Discharge plan needs to be updated    Co-evaluation             End of Session Equipment Utilized During Treatment: Gait belt Activity Tolerance: Patient tolerated treatment well;Patient limited by fatigue;Patient limited by pain Patient left: in chair;with call bell/phone within reach;with chair alarm set;with family/visitor present     Time: 6948-5462 PT Time Calculation (min): 26 min  Charges:  $Therapeutic Exercise: 8-22  mins $Therapeutic Activity: 8-22 mins                    G Codes:      Ramond Dial Aug 17, 2014, 12:15 PM  Mee Hives, PT MS Acute Rehab Dept. Number: [phone]

## 2014-07-18 NOTE — Progress Notes (Signed)
[facility] Progress Note   [name] VQQ:[id number] DOB: 12-31-1927 DOA: 07/01/2014 PCP: [name], MD  Brief narrative: [name] is a 80 y.o. male with h/o PVD, COPD on home O2, CAD, A-fib, DM 2, HTN, chronic left pleural effusion, CVA with left arm weakness, PVD- femoral artery stent and claudication of calf muscles and Macular degeneration.   The patient presents to the hospital with nausea, vomiting and diarrhea and is found to have gastric and duodenal emphysema.  He was started on broad spectrum antibiotics due to suspected gastroenteritis and managed with NG tube suctioning. As improvement was noted, tube feeds were initiated on 10/15 along with Reglan for gastroparesis however, as he did not tolerate the feeds, NG was switched back to suction on 10/17.  Nasojejunal tube placed on 10/19 by IR and tube feeds resumed.    Subjective: No complaints - continues to tolerate full liquids  Assessment/Plan: Gastric emphysema/ Gastric dysmotility:  -S/P Nasojejunal tube by IR on 10-19. Started on tube feeding 10-19 which he has been tolerating well - advance to soft diet today-  - cut back on Tube feeds to bedtime only to allow patient to feel hungry during the day - Received 8 days of zosyn And 6 fluconazole.  -Holding po meds for now. -On schedule Reglan for gastric motility  -Surgery consult appreciated- no need for surgery at this time -Endoscopy: was normal.  -Continue with Protonix, GI prophylaxis.   Gastroenteritis  Supportive care with fluids, antiemetics and pain control  No further diarrhea since admission.   Encephalopathy - suspected to be hospital acquired delirium resolved.   Hypoglycemia:  Started on D 5 half NS with improvement- have d/c'd IVF as he is tolerating tube feeds now and sugars are stable  Hyponatremia/hypernatremia  Resolved- last sodium was 137   Acute renal failure:  Appears to have been prerenal with severe dehydration  secondary to high gastric output  -Cr peak to 2.1 and improved to normal after hydrating  Afib  on low dose scheduled IV lopressor. Po Coreg and ASA on hold  - is not on anticoagulation at home  COPD  Prn nebs   Recurrent left pleural effusion  Chronic - followed by thoracic surgery requiring thoracentesis. Non malignant effusion.   HTN  off home po meds. Iv lopressor and prn hydralazine   DM  Hold metformin. SSI (sensitive )   Hypokalemia  Replenish with KCl    Code Status: no intubation Family Communication: with wife at bedside Disposition Plan:  home with 24 hr supervision and home health DVT prophylaxis: s/q heparin  Consultants: GI  Procedures: NJ tube  Antibiotics: Anti-infectives   Start     Dose/Rate Route Frequency Ordered Stop   07/02/14 1200  piperacillin-tazobactam (ZOSYN) IVPB 3.375 g  Status:  Discontinued     3.375 g 12.5 mL/hr over 240 Minutes Intravenous Every 8 hours 07/02/14 0421 07/10/14 1355   07/02/14 0430  piperacillin-tazobactam (ZOSYN) IVPB 3.375 g  Status:  Discontinued     3.375 g 100 mL/hr over 30 Minutes Intravenous  Once 07/02/14 0421 07/02/14 1625   07/02/14 0400  fluconazole (DIFLUCAN) IVPB 200 mg  Status:  Discontinued     200 mg 100 mL/hr over 60 Minutes Intravenous Every 24 hours 07/02/14 0345 07/07/14 1313   07/02/14 0000  cefTRIAXone (ROCEPHIN) 1 g in dextrose 5 % 50 mL IVPB  Status:  Discontinued     1 g 100 mL/hr over 30 Minutes Intravenous  Once 07/01/14  2354 07/01/14 2357         Objective: Filed Weights   07/16/14 0522 07/17/14 0444 07/18/14 0459  Weight: 59.91 kg (132 lb 1.2 oz) 59.1 kg (130 lb 4.7 oz) 58.5 kg (128 lb 15.5 oz)    Intake/Output Summary (Last 24 hours) at 07/18/14 1047 Last data filed at 07/18/14 0959  Gross per 24 hour  Intake   2763 ml  Output    900 ml  Net   1863 ml     Vitals Filed Vitals:   07/17/14 1413 07/17/14 1938 07/18/14 0459 07/18/14 0757  BP: 117/68 112/66 116/48   Pulse:  87 90    Temp: 97.8 F (36.6 C) 97.3 F (36.3 C) 97 F (36.1 C)   TempSrc: Axillary Oral Oral   Resp: 20 20 20    Height:      Weight:   58.5 kg (128 lb 15.5 oz)   SpO2: 97% 95% 97% 98%    Exam: General: No acute respiratory distress Lungs: Clear to auscultation bilaterally without wheezes or crackles Cardiovascular: Regular rate and rhythm without murmur gallop or rub normal S1 and S2 Abdomen: Nontender, nondistended, soft, bowel sounds positive, no rebound, no ascites, no appreciable mass Extremities: No significant cyanosis, clubbing, or edema bilateral lower extremities  Data Reviewed: Basic Metabolic Panel:  Recent Labs Lab 07/11/14 1120 07/12/14 0635 07/14/14 0526 07/18/14 0820  NA 132* 137 137 134*  K 4.0 4.1 4.9 5.6*  CL 93* 99 102 95*  CO2 26 24 24 29   GLUCOSE 168* 181* 143* 158*  BUN 17 14 17  34*  CREATININE 0.98 0.89 0.74 0.89  CALCIUM 9.1 8.7 8.6 9.0   Liver Function Tests: No results found for this basename: AST, ALT, ALKPHOS, BILITOT, PROT, ALBUMIN,  in the last 168 hours No results found for this basename: LIPASE, AMYLASE,  in the last 168 hours No results found for this basename: AMMONIA,  in the last 168 hours CBC:  Recent Labs Lab 07/12/14 1155  WBC 6.5  HGB 10.8*  HCT 32.8*  MCV 98.8  PLT PLATELETS APPEAR ADEQUATE   Cardiac Enzymes: No results found for this basename: CKTOTAL, CKMB, CKMBINDEX, TROPONINI,  in the last 168 hours BNP (last 3 results) No results found for this basename: PROBNP,  in the last 8760 hours CBG:  Recent Labs Lab 07/17/14 1735 07/17/14 1950 07/18/14 0038 07/18/14 0418 07/18/14 0839  GLUCAP 174* 95 154* 164* 165*    No results found for this or any previous visit (from the past 240 hour(s)).   Studies:  Recent x-ray studies have been reviewed in detail by the Attending Physician  Scheduled Meds:  Scheduled Meds: . antiseptic oral rinse  7 mL Mouth Rinse BID  . antiseptic oral rinse  7 mL Mouth Rinse  q12n4p  . chlorhexidine  15 mL Mouth Rinse BID  . feeding supplement (PRO-STAT SUGAR FREE 64)  30 mL Per Tube Q24H  . free water  90 mL Per Tube Q3H  . heparin subcutaneous  5,000 Units Subcutaneous 3 times per day  . insulin aspart  0-9 Units Subcutaneous Q4H  . metoCLOPramide (REGLAN) injection  10 mg Intravenous 4 times per day  . metoprolol  2.5 mg Intravenous 3 times per day  . pantoprazole (PROTONIX) IV  40 mg Intravenous Q12H  . sodium chloride  3 mL Intravenous Q12H  . tiotropium  18 mcg Inhalation Daily   Continuous Infusions: . feeding supplement (JEVITY 1.2 CAL) 1,000 mL (07/18/14 0200)  Time spent on care of this patient: 30 min   [place], MD 07/18/2014, 10:47 AM  LOS: 17 days   [facility] Office  [phone] Pager - Text Page per [email or web address]  If 7PM-7AM, please contact night-coverage [email or web address]

## 2014-07-18 NOTE — Progress Notes (Signed)
UR completed Peityn Payton K. Ashrita Chrismer, RN, BSN, [place], CCM  07/18/2014 10:42 AM

## 2014-07-18 NOTE — Progress Notes (Signed)
Report given to receiving RN. Patient in bed resting. No verbal complaints and no signs or symptoms of distress or discomfort noted.

## 2014-07-18 NOTE — Progress Notes (Signed)
Subjective: Tolerated clear liquids, is hungry, wants more food. No abdominal pain.  Objective: Vital signs in last 24 hours: Temp:  [97 F (36.1 C)-97.8 F (36.6 C)] 97 F (36.1 C) (10/26 0459) Pulse Rate:  [87-90] 90 (10/25 1938) Resp:  [20] 20 (10/26 0459) BP: (112-117)/(48-68) 116/48 mmHg (10/26 0459) SpO2:  [95 %-98 %] 98 % (10/26 0757) Weight:  [58.5 kg (128 lb 15.5 oz)] 58.5 kg (128 lb 15.5 oz) (10/26 0459) Weight change: -0.6 kg (-1 lb 5.2 oz) Last BM Date: 07/14/14  PE: GEN:  NAD, chronically ill-appearing HEENT:  Nasojejunal tube in right nare ABD:  Soft, non-tender, non-distended  Lab Results: CBC    Component Value Date/Time   WBC 6.5 07/12/2014 1155   RBC 3.32* 07/12/2014 1155   RBC 2.41* 12/31/2013 2330   HGB 10.8* 07/12/2014 1155   HCT 32.8* 07/12/2014 1155   PLT PLATELETS APPEAR ADEQUATE 07/12/2014 1155   MCV 98.8 07/12/2014 1155   MCH 32.5 07/12/2014 1155   MCHC 32.9 07/12/2014 1155   RDW 14.4 07/12/2014 1155   LYMPHSABS 1.4 07/05/2014 0421   MONOABS 1.9* 07/05/2014 0421   EOSABS 0.0 07/05/2014 0421   BASOSABS 0.0 07/05/2014 0421   CMP     Component Value Date/Time   NA 134* 07/18/2014 0820   K 5.6* 07/18/2014 0820   CL 95* 07/18/2014 0820   CO2 29 07/18/2014 0820   GLUCOSE 158* 07/18/2014 0820   BUN 34* 07/18/2014 0820   CREATININE 0.89 07/18/2014 0820   CALCIUM 9.0 07/18/2014 0820   PROT 6.6 07/05/2014 0421   ALBUMIN 2.6* 07/05/2014 0421   AST 23 07/05/2014 0421   ALT 16 07/05/2014 0421   ALKPHOS 74 07/05/2014 0421   BILITOT 0.6 07/05/2014 0421   GFRNONAA 76* 07/18/2014 0820   GFRAA 89* 07/18/2014 0820   Assessment:  1.  Functional gastroparesis, in setting of emphysematous gastritis, without evidence of gastric outlet obstruction on endoscopy.  Emphysematous gastric changes (pneumatosis) have resolved on subsequent CT scan. 2.  Feeding difficulties, in setting of #1 above, on nasojejunal tube feeds.  Patient is retrying peroral  diet.  Plan:  1.  Agree with slowly advancing diet, today advancing to soft diet. 2.  Agree with holding tube feeds during day, and only administering at night (8P to 6A). 3.  Would do calorie count over the next couple days, to gauge caloric intake. 4.  If calorie intake over the next couple days is deemed sufficient by Nutritionist, then could consider removing nasojejunal tube at that time. 5.  [facility] will follow.   Landry Dyke 07/18/2014, 10:01 AM

## 2014-07-18 NOTE — Progress Notes (Signed)
NUTRITION FOLLOW UP/CONSULT  Intervention:   Calorie Count Add Ensure Complete BID, each supplement provides 350 kcal and 13 grams of protein. Infuse Jevity 1.2 at 80 ml/hr via panda NJ tube for 10 hours daily from 8 pm to 6 am. Continue 30 ml Pro-stat via tube daily.  Tube feeding regimen will provide 1060 kcal (62% of needs), 59 grams of protein (65% of needs), and 648 ml of H2O.  Discontinue free water flushes RD to continue to monitor and edit TF regimen as needed Recommend stool softener/laxative (last BM 4 days ago)  Nutrition Dx:   1) Increased nutrient needs related to chronic illness as evidenced by estimated nutrition needs; ongoing  2) Inadequate oral intake related to inability to eat as evidenced by NPO status; ongoing  Goal:   Pt to meet >/= 90% of their estimated nutrition needs; ongoing  Monitor:   TF rate/tolerance, diet advancement/PO intake, weight trend, labs, I/O's  Assessment:   Pt with PMH of Dysrhythmia; HTN; SOB; Claudication of calf muscles; GERD; TIA ; Peripheral vascular disease; On home oxygen therapy; Neuromuscular disorder; Macular degeneration, bilateral; Coronary artery disease; COPD; T2DM; Stroke; Arthritis; and Pneumonia. Presented with 2 day hx of nausea vomiting and diarrhea.  Calorie Count ordered. Pt's diet was advanced to clear liquids on 10/24, full liquids on 10/25, and soft diet today. Pt denies having any nausea or uncomfortable fullness after eating. Per nursing notes pt is eating a few bites to 25% of each meal. NG tube remains in place. Per MD note, plan to change TF regimen from infusing over 18 hours to infusing only overnight 8P to 6A. Pt's weight had trended up to 133 lbs on Friday but, started to trend back down again over the weekend.  Pt is agreeable to receiving Ensure Complete after meals to maximize oral energy/protein intake and promote weight gain.  Glucose ranging gfrom 95 to 181 mg/dL I/O: +6.9 L Labs: low sodium, high  potassium   Height: Ht Readings from Last 1 Encounters:  07/02/14 5\' 6"  (1.676 m)    Weight Status:   Wt Readings from Last 1 Encounters:  07/18/14 128 lb 15.5 oz (58.5 kg)  07/13/14 126 lb 07/08/14 132 lb 07/02/14 138 lb  Re-estimated needs:  Kcal: 1700-1900  Protein: 90-100 gm  Fluid: 1.6-1.8 L  Skin: Stage I pressure ulcer on sacrum; non-pitting RLE and LLE edema  Diet Order: Criss Rosales   Intake/Output Summary (Last 24 hours) at 07/18/14 1029 Last data filed at 07/18/14 0959  Gross per 24 hour  Intake   2763 ml  Output    900 ml  Net   1863 ml    Last BM: 10/22   Labs:   Recent Labs Lab 07/12/14 0635 07/14/14 0526 07/18/14 0820  NA 137 137 134*  K 4.1 4.9 5.6*  CL 99 102 95*  CO2 24 24 29   BUN 14 17 34*  CREATININE 0.89 0.74 0.89  CALCIUM 8.7 8.6 9.0  GLUCOSE 181* 143* 158*    CBG (last 3)   Recent Labs  07/18/14 0038 07/18/14 0418 07/18/14 0839  GLUCAP 154* 164* 165*    Scheduled Meds: . antiseptic oral rinse  7 mL Mouth Rinse BID  . antiseptic oral rinse  7 mL Mouth Rinse q12n4p  . chlorhexidine  15 mL Mouth Rinse BID  . feeding supplement (PRO-STAT SUGAR FREE 64)  30 mL Per Tube Q24H  . free water  90 mL Per Tube Q3H  . heparin subcutaneous  5,000  Units Subcutaneous 3 times per day  . insulin aspart  0-9 Units Subcutaneous Q4H  . metoCLOPramide (REGLAN) injection  10 mg Intravenous 4 times per day  . metoprolol  2.5 mg Intravenous 3 times per day  . pantoprazole (PROTONIX) IV  40 mg Intravenous Q12H  . sodium chloride  3 mL Intravenous Q12H  . tiotropium  18 mcg Inhalation Daily    Continuous Infusions: . feeding supplement (JEVITY 1.2 CAL) 1,000 mL (07/18/14 0200)    Pryor Ochoa RD, LDN Inpatient Clinical Dietitian Pager: [phone] After Hours Pager: [phone]

## 2014-07-19 LAB — BASIC METABOLIC PANEL
Anion gap: 10 (ref 5–15)
Anion gap: 13 (ref 5–15)
BUN: 35 mg/dL — AB (ref 6–23)
BUN: 32 mg/dL — ABNORMAL HIGH (ref 6–23)
CALCIUM: 9.1 mg/dL (ref 8.4–10.5)
CALCIUM: 9.3 mg/dL (ref 8.4–10.5)
CO2: 28 mEq/L (ref 19–32)
CO2: 24 meq/L (ref 19–32)
CREATININE: 0.76 mg/dL (ref 0.50–1.35)
Chloride: 97 mEq/L (ref 96–112)
Chloride: 98 mEq/L (ref 96–112)
Creatinine, Ser: 0.79 mg/dL (ref 0.50–1.35)
GFR calc Af Amer: >90 mL/min (ref >90)
GFR calc non Af Amer: 81 mL/min — ABNORMAL LOW (ref >90)
GFR, EST NON AFRICAN AMERICAN: 80 mL/min — AB (ref >90)
GLUCOSE: 137 mg/dL — AB (ref 70–99)
Glucose, Bld: 114 mg/dL — ABNORMAL HIGH (ref 70–99)
POTASSIUM: 5.5 meq/L — AB (ref 3.7–5.3)
Potassium: 5.3 mEq/L (ref 3.7–5.3)
SODIUM: 135 meq/L — AB (ref 137–147)
SODIUM: 135 meq/L — AB (ref 137–147)

## 2014-07-19 LAB — GLUCOSE, CAPILLARY
Glucose-Capillary: 113 mg/dL — ABNORMAL HIGH (ref 70–99)
Glucose-Capillary: 115 mg/dL — ABNORMAL HIGH (ref 70–99)
Glucose-Capillary: 132 mg/dL — ABNORMAL HIGH (ref 70–99)
Glucose-Capillary: 138 mg/dL — ABNORMAL HIGH (ref 70–99)
Glucose-Capillary: 140 mg/dL — ABNORMAL HIGH (ref 70–99)
Glucose-Capillary: 164 mg/dL — ABNORMAL HIGH (ref 70–99)

## 2014-07-19 MED ORDER — PANTOPRAZOLE SODIUM 40 MG PO TBEC
40.0000 mg | DELAYED_RELEASE_TABLET | Freq: Two times a day (BID) | ORAL | Status: DC
  Administered 2014-07-19 – 2014-07-22 (×7): 40 mg via ORAL
  Filled 2014-07-19 (×7): qty 1

## 2014-07-19 MED ORDER — INSULIN ASPART 100 UNIT/ML ~~LOC~~ SOLN
0.0000 [IU] | Freq: Three times a day (TID) | SUBCUTANEOUS | Status: DC
  Administered 2014-07-19 – 2014-07-20 (×3): 1 [IU] via SUBCUTANEOUS
  Administered 2014-07-20: 3 [IU] via SUBCUTANEOUS
  Administered 2014-07-21 (×3): 1 [IU] via SUBCUTANEOUS
  Administered 2014-07-22: 2 [IU] via SUBCUTANEOUS
  Administered 2014-07-22: 1 [IU] via SUBCUTANEOUS

## 2014-07-19 MED ORDER — ENSURE COMPLETE PO LIQD
237.0000 mL | Freq: Two times a day (BID) | ORAL | Status: DC
  Administered 2014-07-20 – 2014-07-22 (×6): 237 mL via ORAL

## 2014-07-19 MED ORDER — CILOSTAZOL 50 MG PO TABS
50.0000 mg | ORAL_TABLET | Freq: Two times a day (BID) | ORAL | Status: DC
  Administered 2014-07-19 – 2014-07-22 (×6): 50 mg via ORAL
  Filled 2014-07-19 (×7): qty 1

## 2014-07-19 MED ORDER — DIPHENOXYLATE-ATROPINE 2.5-0.025 MG/5ML PO LIQD: 10.0000 mL | Freq: Four times a day (QID) | ORAL | Status: DC | PRN

## 2014-07-19 MED ORDER — CARVEDILOL 6.25 MG PO TABS
6.2500 mg | ORAL_TABLET | Freq: Two times a day (BID) | ORAL | Status: DC
  Administered 2014-07-19 – 2014-07-22 (×6): 6.25 mg via ORAL
  Filled 2014-07-19 (×8): qty 1

## 2014-07-19 MED ORDER — FAMOTIDINE 20 MG PO TABS: 20.0000 mg | ORAL_TABLET | Freq: Every day | ORAL | Status: DC

## 2014-07-19 MED ORDER — ALLOPURINOL 100 MG PO TABS
100.0000 mg | ORAL_TABLET | Freq: Two times a day (BID) | ORAL | Status: DC
  Administered 2014-07-19 – 2014-07-22 (×7): 100 mg via ORAL
  Filled 2014-07-19 (×8): qty 1

## 2014-07-19 MED ORDER — SODIUM CHLORIDE 0.9 % IV BOLUS (SEPSIS)
500.0000 mL | Freq: Once | INTRAVENOUS | Status: AC
  Administered 2014-07-19: 500 mL via INTRAVENOUS

## 2014-07-19 MED ORDER — GLUCERNA SHAKE PO LIQD
237.0000 mL | ORAL | Status: DC
  Administered 2014-07-19 – 2014-07-21 (×3): 237 mL via ORAL

## 2014-07-19 MED ORDER — JEVITY 1.2 CAL PO LIQD
1000.0000 mL | ORAL | Status: DC
  Administered 2014-07-19: 23:00:00
  Filled 2014-07-19 (×2): qty 1000

## 2014-07-19 MED ORDER — SODIUM CHLORIDE 0.9 % IV SOLN
INTRAVENOUS | Status: DC
  Administered 2014-07-19 – 2014-07-20 (×2): via INTRAVENOUS
  Administered 2014-07-20: 1000 mL via INTRAVENOUS

## 2014-07-19 NOTE — Progress Notes (Signed)
Subjective: Eating about 50% of food per calorie count yesterday. No abdominal pain, nausea, vomiting.  Objective: Vital signs in last 24 hours: Temp:  [97.3 F (36.3 C)-97.5 F (36.4 C)] 97.5 F (36.4 C) (10/27 0527) Pulse Rate:  [70-95] 70 (10/27 0527) Resp:  [17-18] 17 (10/27 0527) BP: (93-109)/(53-79) 102/66 mmHg (10/27 0527) SpO2:  [95 %-98 %] 95 % (10/27 0857) Weight:  [58 kg (127 lb 13.9 oz)] 58 kg (127 lb 13.9 oz) (10/27 0527) Weight change: -0.5 kg (-1 lb 1.6 oz) Last BM Date: 07/18/14  PE: GEN:  NAD, chronically ill-appearing HEENT:  Nasojejunal tube right nare ABD:  Soft, non-tender, non-distended.  Lab Results: CBC    Component Value Date/Time   WBC 6.5 07/12/2014 1155   RBC 3.32* 07/12/2014 1155   RBC 2.41* 12/31/2013 2330   HGB 10.8* 07/12/2014 1155   HCT 32.8* 07/12/2014 1155   PLT PLATELETS APPEAR ADEQUATE 07/12/2014 1155   MCV 98.8 07/12/2014 1155   MCH 32.5 07/12/2014 1155   MCHC 32.9 07/12/2014 1155   RDW 14.4 07/12/2014 1155   LYMPHSABS 1.4 07/05/2014 0421   MONOABS 1.9* 07/05/2014 0421   EOSABS 0.0 07/05/2014 0421   BASOSABS 0.0 07/05/2014 0421   CMP     Component Value Date/Time   NA 135* 07/19/2014 0810   K 5.5* 07/19/2014 0810   CL 97 07/19/2014 0810   CO2 28 07/19/2014 0810   GLUCOSE 114* 07/19/2014 0810   BUN 35* 07/19/2014 0810   CREATININE 0.79 07/19/2014 0810   CALCIUM 9.1 07/19/2014 0810   PROT 6.6 07/05/2014 0421   ALBUMIN 2.6* 07/05/2014 0421   AST 23 07/05/2014 0421   ALT 16 07/05/2014 0421   ALKPHOS 74 07/05/2014 0421   BILITOT 0.6 07/05/2014 0421   GFRNONAA 80* 07/19/2014 0810   GFRAA >90 07/19/2014 0810   Assessment:  1. Functional gastroparesis, in setting of emphysematous gastritis, without evidence of gastric outlet obstruction on endoscopy. Emphysematous gastric changes (pneumatosis) have resolved on subsequent CT scan.  2. Feeding difficulties, in setting of #1 above, on nasojejunal tube feeds. Patient is  retrying peroral diet.  Plan:  1.  Advance diet, continue calorie count another 24 hours. 2.  Can decrease nasojejunal tube feeds down to 40 cc/hr at night tonight. 3.  If continues to improve his eating over the next 24 hours, might be able to consider stopping tube feeds and discontinuing tube feeds over the next day or two. 4.  [facility] will follow.   Landry Dyke 07/19/2014, 10:05 AM

## 2014-07-19 NOTE — Progress Notes (Addendum)
[facility] Progress Note   [name] AST:[id number] DOB: 1928/04/10 DOA: 07/01/2014 PCP: [name], MD  Brief narrative: [name] is a 80 y.o. male with h/o PVD, COPD on home O2, CAD, A-fib, DM 2, HTN, chronic left pleural effusion, CVA with left arm weakness, PVD- femoral artery stent and claudication of calf muscles and Macular degeneration.   The patient presents to the hospital with nausea, vomiting and diarrhea and is found to have gastric and duodenal emphysema.  He was started on broad spectrum antibiotics due to suspected gastroenteritis and managed with NG tube suctioning. As improvement was noted, tube feeds were initiated on 10/15 along with Reglan for gastroparesis however, as he did not tolerate the feeds and NG was switched back to suction on 10/17.  Due to high residuals and suspected gastric dysmotility, it was decided to bypass the stomach and place and Nasojejunal tube - this was placed on 10/19 by IR and tube feeds resumed. He has been tolerating tube feeds quite well and slowly has started oral feeds and is now on a soft diet. Plan is to continue to monitor him on this diet and if he continues to tolerate it, NJ tube can be removed.   Subjective: No complaints - tolerating meals-per RN he is eating greater than 50% of his meals but he is now having some loose stools. No abdominal pain and no nausea or vomiting.  Assessment/Plan: Gastric emphysema/ Gastric dysmotility- ischemic versus infectious etiology  -S/P Nasojejunal tube by IR on 10-19. Started on tube feeding 10-19 which he has been tolerating well- repeat CT reveals resolution of emphysema  - Continue soft diet today-  - cut back on Tube feeds to 40 mL at night only to allow patient to feel hungry during the day -Remove nasojejunal tube tomorrow if he continues to tolerate solid food - Received 8 days of zosyn And 6 fluconazole.  -On schedule Reglan for gastric motility -can d/c now as he is  now having diarrhea which may be related to the Reglan -Surgery consult appreciated- no need for surgery at this time -Endoscopy: was normal.  -Continue with Protonix, GI prophylaxis.   Gastroenteritis  Supportive care with fluids, antiemetics and pain control  Having some diarrhea once again but this is negative for C. Difficile-we'll order Lomotil when necessary and follow  Encephalopathy - suspected to be hospital acquired delirium  - resolved.   Hypoglycemia:  Started on D 5 half NS with improvement-us was discontinued once he was able to tolerate tube feeds -Hypoglycemia was likely secondary to use of insulin in setting of poor by mouth intake  Hyponatremia/dehydration -Despite receiving tube feeds, he appears to be dehydrated again  (having diarrhea as well which may be contributing ) -will re-start IV fluids today and follow  Acute renal failure:  Appears to have been prerenal with severe dehydration secondary to high gastric output  -Cr peak to 2.1 and improved to normal after hydrating -. Based upon blood work, it Appears that he is becoming dehydrated once again despite being on tube feeds at night-we'll start IV fluids and encourage him to increase fluid intake  Hypokalemia /hyperkalemia Replenished with KCl and now hyperkalemic -We'll hydrate and repeat potassium this evening  Afib  on low dose scheduled IV lopressor. Po Coreg and ASA been on hold-will resume today and DC IV Lopressor - is not on anticoagulation at home  COPD  Prn nebs   Recurrent left pleural effusion  Chronic - followed by  thoracic surgery requiring thoracentesis. Non malignant effusion.   HTN  Cont Coreg  DM  Hold metformin. SSI (sensitive )      Code Status: no intubation Family Communication: with wife at bedside Disposition Plan:  SNF DVT prophylaxis: s/q heparin  Consultants: GI  Procedures: NJ tube  Antibiotics: Anti-infectives   Start     Dose/Rate Route Frequency  Ordered Stop   07/02/14 1200  piperacillin-tazobactam (ZOSYN) IVPB 3.375 g  Status:  Discontinued     3.375 g 12.5 mL/hr over 240 Minutes Intravenous Every 8 hours 07/02/14 0421 07/10/14 1355   07/02/14 0430  piperacillin-tazobactam (ZOSYN) IVPB 3.375 g  Status:  Discontinued     3.375 g 100 mL/hr over 30 Minutes Intravenous  Once 07/02/14 0421 07/02/14 1625   07/02/14 0400  fluconazole (DIFLUCAN) IVPB 200 mg  Status:  Discontinued     200 mg 100 mL/hr over 60 Minutes Intravenous Every 24 hours 07/02/14 0345 07/07/14 1313   07/02/14 0000  cefTRIAXone (ROCEPHIN) 1 g in dextrose 5 % 50 mL IVPB  Status:  Discontinued     1 g 100 mL/hr over 30 Minutes Intravenous  Once 07/01/14 2354 07/01/14 2357         Objective: Filed Weights   07/17/14 0444 07/18/14 0459 07/19/14 0527  Weight: 59.1 kg (130 lb 4.7 oz) 58.5 kg (128 lb 15.5 oz) 58 kg (127 lb 13.9 oz)    Intake/Output Summary (Last 24 hours) at 07/19/14 1214 Last data filed at 07/19/14 0932  Gross per 24 hour  Intake      3 ml  Output    325 ml  Net   -322 ml     Vitals Filed Vitals:   07/18/14 1518 07/18/14 2100 07/19/14 0527 07/19/14 0857  BP: 93/79 109/73 102/66   Pulse: 85 95 70   Temp: 97.3 F (36.3 C) 97.3 F (36.3 C) 97.5 F (36.4 C)   TempSrc: Axillary Oral Oral   Resp: 18 18 17    Height:      Weight:   58 kg (127 lb 13.9 oz)   SpO2: 96% 98% 96% 95%    Exam: General: No acute respiratory distress Lungs: Clear to auscultation bilaterally without wheezes or crackles Cardiovascular: Regular rate and rhythm without murmur gallop or rub normal S1 and S2 Abdomen: Nontender, nondistended, soft, bowel sounds positive, no rebound, no ascites, no appreciable mass Extremities: No significant cyanosis, clubbing, or edema bilateral lower extremities  Data Reviewed: Basic Metabolic Panel:  Recent Labs Lab 07/14/14 0526 07/18/14 0820 07/19/14 0810  NA 137 134* 135*  K 4.9 5.6* 5.5*  CL 102 95* 97  CO2 24 29 28    GLUCOSE 143* 158* 114*  BUN 17 34* 35*  CREATININE 0.74 0.89 0.79  CALCIUM 8.6 9.0 9.1   Liver Function Tests: No results found for this basename: AST, ALT, ALKPHOS, BILITOT, PROT, ALBUMIN,  in the last 168 hours No results found for this basename: LIPASE, AMYLASE,  in the last 168 hours No results found for this basename: AMMONIA,  in the last 168 hours CBC: No results found for this basename: WBC, NEUTROABS, HGB, HCT, MCV, PLT,  in the last 168 hours Cardiac Enzymes: No results found for this basename: CKTOTAL, CKMB, CKMBINDEX, TROPONINI,  in the last 168 hours BNP (last 3 results) No results found for this basename: PROBNP,  in the last 8760 hours CBG:  Recent Labs Lab 07/18/14 2007 07/18/14 2349 07/19/14 0413 07/19/14 0841 07/19/14 1106  GLUCAP 119*  193* 164* 138* 115*    Recent Results (from the past 240 hour(s))  CLOSTRIDIUM DIFFICILE BY PCR     Status: None   Collection Time    07/18/14  2:17 PM      Result Value Ref Range Status   C difficile by pcr NEGATIVE  NEGATIVE Final     Studies:  Recent x-ray studies have been reviewed in detail by the Attending Physician  Scheduled Meds:  Scheduled Meds: . antiseptic oral rinse  7 mL Mouth Rinse BID  . antiseptic oral rinse  7 mL Mouth Rinse q12n4p  . chlorhexidine  15 mL Mouth Rinse BID  . feeding supplement (ENSURE COMPLETE)  237 mL Oral BID WC  . feeding supplement (PRO-STAT SUGAR FREE 64)  30 mL Per Tube Q24H  . heparin subcutaneous  5,000 Units Subcutaneous 3 times per day  . insulin aspart  0-9 Units Subcutaneous Q4H  . metoCLOPramide (REGLAN) injection  10 mg Intravenous 4 times per day  . metoprolol  2.5 mg Intravenous 3 times per day  . pantoprazole  40 mg Oral BID  . sodium chloride  3 mL Intravenous Q12H  . tiotropium  18 mcg Inhalation Daily   Continuous Infusions: . sodium chloride 100 mL/hr at 07/19/14 4098    Time spent on care of this patient: 54 min   Hart[place], MD 07/19/2014, 12:14  PM  LOS: 18 days   [facility] Office  [phone] Pager - Text Page per [email or web address]  If 7PM-7AM, please contact night-coverage [email or web address]

## 2014-07-19 NOTE — Progress Notes (Signed)
Calorie Count Note  48 hour calorie count ordered.  Diet: Soft Diet Supplements: Ensure Complete BID, each supplement provides 350 kcal and 13 grams of protein  Breakfast: 0 kcal, 0 grams protein Lunch: 292 kcal, 10 grams protein Dinner: 173 kcal, 8 grams protein Supplements: 350 kcal, 13 grams protein  Total oral intake intake: 815 kcal (48% of minimum estimated needs)  31 grams protein (35% of minimum estimated needs)  On overnight 10/26 provided 940 calories and 54 grams of protein. TF plus oral intake provided 103% of estimated calorie needs and 94% of estimated protein needs.   Nutrition Dx:  1) Increased nutrient needs related to chronic illness as evidenced by estimated nutrition needs; ongoing  2) Inadequate oral intake related to inability to eat as evidenced by NPO status; ongoing   Goal: Pt to meet >/= 90% of their estimated nutrition needs; met x 1 day  Intervention:  Continue Ensure Complete po BID with meals, each supplement provides 350 kcal and 13 grams of protein Provide Glucerna Shake as HS snack   Pryor Ochoa RD, LDN Inpatient Clinical Dietitian Pager: [phone] After Hours Pager: [phone]

## 2014-07-19 NOTE — Progress Notes (Signed)
Report given to receiving RN. Patient in bed resting. No signs or symptoms of distress or discomfort noted.  

## 2014-07-20 DIAGNOSIS — K3189 Other diseases of stomach and duodenum: Secondary | ICD-10-CM | POA: Diagnosis present

## 2014-07-20 DIAGNOSIS — G9340 Encephalopathy, unspecified: Secondary | ICD-10-CM

## 2014-07-20 LAB — BASIC METABOLIC PANEL
Anion gap: 10 (ref 5–15)
BUN: 30 mg/dL — ABNORMAL HIGH (ref 6–23)
CO2: 26 meq/L (ref 19–32)
Calcium: 8.9 mg/dL (ref 8.4–10.5)
Chloride: 100 mEq/L (ref 96–112)
Creatinine, Ser: 0.82 mg/dL (ref 0.50–1.35)
GFR calc Af Amer: >90 mL/min (ref >90)
GFR calc non Af Amer: 79 mL/min — ABNORMAL LOW (ref >90)
Glucose, Bld: 129 mg/dL — ABNORMAL HIGH (ref 70–99)
Potassium: 4.7 mEq/L (ref 3.7–5.3)
Sodium: 136 mEq/L — ABNORMAL LOW (ref 137–147)

## 2014-07-20 LAB — GLUCOSE, CAPILLARY
GLUCOSE-CAPILLARY: 211 mg/dL — AB (ref 70–99)
Glucose-Capillary: 148 mg/dL — ABNORMAL HIGH (ref 70–99)
Glucose-Capillary: 142 mg/dL — ABNORMAL HIGH (ref 70–99)
Glucose-Capillary: 135 mg/dL — ABNORMAL HIGH (ref 70–99)

## 2014-07-20 MED ORDER — JEVITY 1.2 CAL PO LIQD
1000.0000 mL | ORAL | Status: DC
  Administered 2014-07-21 – 2014-07-22 (×2): 1000 mL
  Filled 2014-07-20 (×2): qty 1000

## 2014-07-20 NOTE — Progress Notes (Signed)
Calorie Count Note  48 hour calorie count ordered and now complete  Diet: Soft Diet Supplements: Ensure Complete BID, each supplement provides 350 kcal and 13 grams of protein Glucerna Shake once daily, provides 220 kcal and 9 grams of protein  Breakfast: 224 kcal, 8 grams protein Lunch: 172 kcal,4 grams protein Dinner: 368 kcal, 11 grams protein Supplements: 570 kcal, 22 grams protein  Total oral intake intake: 1334 kcal (78% of minimum estimated needs)  45 grams protein (50% of minimum estimated needs)  TF overnight 10/27 provided 340 calories and 26 grams of protein. TF plus oral intake provided 98% of estimated calorie needs and 79% of estimated protein needs.    Nutrition Dx:  1) Increased nutrient needs related to chronic illness as evidenced by estimated nutrition needs; ongoing  2) Inadequate oral intake related to inability to eat as evidenced by NPO status; ongoing   Goal: Pt to meet >/= 90% of their estimated nutrition needs; met x 1 day  Intervention:  Continue nocturnal NJ tube feedings, infuse Jevity 1.2 @ 40 ml/hr for 10 hours overnight. Continue Ensure Complete po BID with meals, each supplement provides 350 kcal and 13 grams of protein Continue Glucerna Shake once daily   Pryor Ochoa RD, LDN Inpatient Clinical Dietitian Pager: [phone] After Hours Pager: [phone]

## 2014-07-20 NOTE — Progress Notes (Signed)
Physical Therapy Treatment Patient Details Name: [name] MRN: [id number] DOB: 05-15-1928 Today's Date: 07/20/2014    History of Present Illness Pt is an 80 y/o male admitted with an abnormal GI x-ray. Pt had an EGD 07/06/14 and was diagnosed with acute gastric distention, gastric pneumatosis, gastroparesis, and a motility disorder.    PT Comments    Pt is making slow but steady progress. Pt has significant atrophy in bilateral LEs. Pt is very motivated and wants to work with therapy. Pt did a squat pivot transfer to right side with mod assist. Pt also stood with therapist with mod assist for pregait activities. Feel patient would benefit from increasing therapy frequency to 3/wk. Continue to recommend d/c to SNF for continued skilled PT to improve independence with mobility.  Follow Up Recommendations  SNF     Equipment Recommendations  None recommended by PT    Recommendations for Other Services       Precautions / Restrictions Precautions Precautions: Fall Precaution Comments: L UE hemiparesis Restrictions Weight Bearing Restrictions: No    Mobility  Bed Mobility Overal bed mobility: Needs Assistance Bed Mobility: Rolling;Sidelying to Sit Rolling: Supervision Sidelying to sit: Min assist (r side)       General bed mobility comments: cues for technique to increase independence, use of bed rail  Transfers Overall transfer level: Needs assistance   Transfers: Sit to/from [employer];Lateral/Scoot Transfers Sit to Stand: Mod assist   Squat pivot transfers: Mod assist (to right side)    Lateral/Scoot Transfers: Min assist General transfer comment: cues for increasing forward trunk flexion, therapist in front stabolizing both LEs, and assisting with forward translation.  Ambulation/Gait                 Stairs            Wheelchair Mobility    Modified Rankin (Stroke Patients Only)       Balance Overall balance  assessment: Needs assistance Sitting-balance support: Feet supported;Single extremity supported Sitting balance-Leahy Scale: Fair     Standing balance support: Single extremity supported Standing balance-Leahy Scale: Poor Standing balance comment: stood in front of recliner- pt with decreased weight shift over left LE. Did some weight shifting over left and took a step laterally with right, mod assist. Pt needed some facilation on the left hip and knee to encourage activation.                    Cognition Arousal/Alertness: Awake/alert Behavior During Therapy: Flat affect Overall Cognitive Status: Within Functional Limits for tasks assessed                      Exercises Total Joint Exercises Quad Sets: AROM;Strengthening;Both;10 reps;Supine Heel Slides: AROM;AAROM;Strengthening;Both;10 reps;Supine Hip ABduction/ADduction: AROM;Strengthening;Left;10 reps;Supine (bent knee resisted hip abd/add) Straight Leg Raises: AROM;AAROM;Strengthening;Both;10 reps;Supine    General Comments        Pertinent Vitals/Pain Pain Assessment: No/denies pain    Home Living                      Prior Function            PT Goals (current goals can now be found in the care plan section) Progress towards PT goals: Progressing toward goals    Frequency  Min 3X/week    PT Plan Current plan remains appropriate;Frequency needs to be updated    Co-evaluation  End of Session Equipment Utilized During Treatment: Gait belt Activity Tolerance: Patient limited by fatigue Patient left: in chair;with call bell/phone within reach;with family/visitor present     Time: 0950-1020 PT Time Calculation (min): 30 min  Charges:  $Therapeutic Exercise: 8-22 mins $Therapeutic Activity: 8-22 mins                    G Codes:      Lelon Mast 07/20/2014, 10:25 AM

## 2014-07-20 NOTE — Progress Notes (Signed)
Subjective: Poor appetite continues. Eating about 30-50% of meals.  Objective: Vital signs in last 24 hours: Temp:  [97.6 F (36.4 C)-98.5 F (36.9 C)] 98.5 F (36.9 C) (10/28 0539) Pulse Rate:  [69-90] 77 (10/28 0539) Resp:  [16-18] 16 (10/28 0539) BP: (101-125)/(69-84) 101/69 mmHg (10/28 0539) SpO2:  [94 %-99 %] 96 % (10/28 0815) Weight:  [62.506 kg (137 lb 12.8 oz)] 62.506 kg (137 lb 12.8 oz) (10/28 0539) Weight change: 4.506 kg (9 lb 14.9 oz) Last BM Date: 07/18/14  PE: GEN:  Chronically ill-appearing, chronically cachectic-appearing, NAD HEENT:  NJT in right nare ABD:  Soft  Lab Results: CBC    Component Value Date/Time   WBC 6.5 07/12/2014 1155   RBC 3.32* 07/12/2014 1155   RBC 2.41* 12/31/2013 2330   HGB 10.8* 07/12/2014 1155   HCT 32.8* 07/12/2014 1155   PLT PLATELETS APPEAR ADEQUATE 07/12/2014 1155   MCV 98.8 07/12/2014 1155   MCH 32.5 07/12/2014 1155   MCHC 32.9 07/12/2014 1155   RDW 14.4 07/12/2014 1155   LYMPHSABS 1.4 07/05/2014 0421   MONOABS 1.9* 07/05/2014 0421   EOSABS 0.0 07/05/2014 0421   BASOSABS 0.0 07/05/2014 0421   CMP     Component Value Date/Time   NA 136* 07/20/2014 0500   K 4.7 07/20/2014 0500   CL 100 07/20/2014 0500   CO2 26 07/20/2014 0500   GLUCOSE 129* 07/20/2014 0500   BUN 30* 07/20/2014 0500   CREATININE 0.82 07/20/2014 0500   CALCIUM 8.9 07/20/2014 0500   PROT 6.6 07/05/2014 0421   ALBUMIN 2.6* 07/05/2014 0421   AST 23 07/05/2014 0421   ALT 16 07/05/2014 0421   ALKPHOS 74 07/05/2014 0421   BILITOT 0.6 07/05/2014 0421   GFRNONAA 79* 07/20/2014 0500   GFRAA >90 07/20/2014 0500   Assessment:  1. Functional gastroparesis, in setting of emphysematous gastritis, without evidence of gastric outlet obstruction on endoscopy. Emphysematous gastric changes (pneumatosis) have resolved on subsequent CT scan.  2. Feeding difficulties, in setting of #1 above, on nasojejunal tube feeds. Patient is retrying peroral diet, but not eating  enough to support his daily dietary needs.  Plan:  1.  Encouraged patient to try to eat more; if he isn't able to eat in the 70+% range within the next few days, I counseled patient and his wife that he might need a gastrostomy tube with jejunal extension (PEG-J). 2.  Will continue 40 cc/hr nasojejunal tube feeds at night. 3.  Discussed importance of regaining mobility with patient and hospitalist team.   Landry Dyke 07/20/2014, 9:35 AM

## 2014-07-20 NOTE — Progress Notes (Signed)
Per MD- patient and his wife are now agreeable to short term SNF.  Patient continues to require Panda tube feeds and is eating poorly.  CSW spoke with patient's wife Cori Razor and daughter Lenna Sciara via telephone this afternoon to discuss SNF placement and issues re: current Panda tube and attempts to get patient to eat.  Wife is agreeable to SNF and stated that he has been at [facility] in the past. She is interested in [facility] if possible; explained that they do not accept Panda tube.  Daughter had questions about LTAC as well and CSW addressed her questions/concerns.  Active bed search is in place; will follow up with MD re: status of Panda vs G-tube placement vs patient being able to eat.  Discussed with wife and daughter that patient will be d/c'd soon and that a set d/c plan will need to be in place. Lorie Phenix. Pauline Good, [place]

## 2014-07-20 NOTE — Progress Notes (Addendum)
[facility] PROGRESS NOTE Interim History: [name] is a 80 y.o. male with h/o PVD, COPD on home O2, CAD, A-fib, DM 2, HTN, chronic left pleural effusion, CVA with left arm weakness, PVD- femoral artery stent and claudication of calf muscles and Macular degeneration. The patient presents to the hospital with nausea, vomiting and diarrhea and is found to have gastric and intestinal pneumatosis. He was started on broad spectrum antibiotics due to suspected gastroenteritis and managed with NG tube suctioning. As improvement was noted, tube feeds were initiated on 10/15 along with Reglan for gastroparesis however, as he did not tolerate the feeds, NG was switched back to suction on 10/17. Nasojejunal tube placed on 10/19 by IR and tube feeds resumed.    Assessment/Plan: Pneumatosis intestinalis/Gastric dysmotility: - S/P Nasojejunal tube by IR on 10-19. Started on tube feeding 10-19 which he has been tolerating well- repeat CT reveals resolution of pneumatosis. - Started on soft diet. Goal > 90%. - Received 8 days of zosyn And 6 fluconazole.  -On schedule Reglan for gastric motility -can d/c now as he is now having diarrhea, d/c - Surgery consult appreciated- no need for surgery at this time  - Endoscopy: was normal.  - Continue with Protonix, GI prophylaxis.   Gastroenteritis: - Supportive care with fluids, antiemetics and pain control  - C.dif negative.  Acute confusional state: - Resolved.  - Avoid BZ's and/or narcotics.  Hypoglycemia:  - Started on D 5 half NS with improvement-us was discontinued. - Hypoglycemia was likely secondary to use of insulin in setting of poor by mouth intake  Hyponatremia/dehydration: - Despite receiving tube feeds, he appears to be dehydrated again (having diarrhea as well which may be contributing )  - re-start IV fluids today and follow.  Acute renal failure:  - Prerenal with severe dehydration secondary to high gastric output  -  Improved with hydration. - Restart on IV fluids.  Hypokalemia /hyperkalemia: - Replenished with KCl and now hyperkalemic  - We'll hydrate and repeat potassium this evening.  Afib  - Held betablocker on admision, was cover with IV. - Coreg has been resume. - Is not on anticoagulation at home, - Xarelto on hold secondary to anemia with positive Hemoccult on 4.2015. - can resume as an outpatient Hbg has remained stable. Follow up with cardiology as an outpatinet.  COPD  Prn nebs   Recurrent left pleural effusion: Chronic - followed by thoracic surgery requiring thoracentesis. Non malignant effusion.   HTN  Cont Coreg   DM  Hold metformin. SSI (sensitive )    Code Status: no intubation  Family Communication: with wife at bedside  Disposition Plan: SNF  DVT prophylaxis: s/q heparin    Consultants:  GI  Procedures:  BJ tube  CT abdomen  Antibiotics:  Zosyn 10.10>>10.18  Diflucan 10.10>>10.15  10.10 rocephin 10.10>>10.10.2015  HPI/Subjective: Tolerating diet about 40 % of diet  Objective: Filed Vitals:   07/19/14 1330 07/19/14 1745 07/19/14 2008 07/20/14 0539  BP: 108/73 125/70 120/84 101/69  Pulse: 69 90 86 77  Temp: 97.6 F (36.4 C)  97.9 F (36.6 C) 98.5 F (36.9 C)  TempSrc: Oral  Oral Oral  Resp: 18  17 16   Height:      Weight:    62.506 kg (137 lb 12.8 oz)  SpO2: 94%  99% 96%    Intake/Output Summary (Last 24 hours) at 07/20/14 0811 Last data filed at 07/20/14 0802  Gross per 24 hour  Intake  343 ml  Output    850 ml  Net   -507 ml   Filed Weights   07/18/14 0459 07/19/14 0527 07/20/14 0539  Weight: 58.5 kg (128 lb 15.5 oz) 58 kg (127 lb 13.9 oz) 62.506 kg (137 lb 12.8 oz)    Exam:  General: Alert, awake, oriented x3, in no acute distress.  HEENT: No bruits, no goiter.  Heart: Regular rate and rhythm. Lungs: Good air movement, clear Abdomen: Soft, nontender, nondistended, positive bowel sounds.     Data Reviewed: Basic  Metabolic Panel:  Recent Labs Lab 07/14/14 0526 07/18/14 0820 07/19/14 0810 07/19/14 1540 07/20/14 0500  NA 137 134* 135* 135* 136*  K 4.9 5.6* 5.5* 5.3 4.7  CL 102 95* 97 98 100  CO2 24 29 28 24 26   GLUCOSE 143* 158* 114* 137* 129*  BUN 17 34* 35* 32* 30*  CREATININE 0.74 0.89 0.79 0.76 0.82  CALCIUM 8.6 9.0 9.1 9.3 8.9   Liver Function Tests: No results found for this basename: AST, ALT, ALKPHOS, BILITOT, PROT, ALBUMIN,  in the last 168 hours No results found for this basename: LIPASE, AMYLASE,  in the last 168 hours No results found for this basename: AMMONIA,  in the last 168 hours CBC: No results found for this basename: WBC, NEUTROABS, HGB, HCT, MCV, PLT,  in the last 168 hours Cardiac Enzymes: No results found for this basename: CKTOTAL, CKMB, CKMBINDEX, TROPONINI,  in the last 168 hours BNP (last 3 results) No results found for this basename: PROBNP,  in the last 8760 hours CBG:  Recent Labs Lab 07/19/14 1106 07/19/14 1646 07/19/14 2011 07/19/14 2344 07/20/14 0610  GLUCAP 115* 140* 113* 132* 148*    Recent Results (from the past 240 hour(s))  CLOSTRIDIUM DIFFICILE BY PCR     Status: None   Collection Time    07/18/14  2:17 PM      Result Value Ref Range Status   C difficile by pcr NEGATIVE  NEGATIVE Final     Studies: No results found.  Scheduled Meds: . allopurinol  100 mg Oral BID  . antiseptic oral rinse  7 mL Mouth Rinse BID  . antiseptic oral rinse  7 mL Mouth Rinse q12n4p  . carvedilol  6.25 mg Oral BID WC  . chlorhexidine  15 mL Mouth Rinse BID  . cilostazol  50 mg Oral BID  . feeding supplement (ENSURE COMPLETE)  237 mL Oral BID WC  . feeding supplement (GLUCERNA SHAKE)  237 mL Oral Q24H  . feeding supplement (PRO-STAT SUGAR FREE 64)  30 mL Per Tube Q24H  . heparin subcutaneous  5,000 Units Subcutaneous 3 times per day  . insulin aspart  0-9 Units Subcutaneous TID WC  . pantoprazole  40 mg Oral BID  . sodium chloride  3 mL Intravenous  Q12H  . tiotropium  18 mcg Inhalation Daily   Continuous Infusions: . sodium chloride 100 mL/hr at 07/20/14 0435  . feeding supplement (JEVITY 1.2 CAL) Stopped (07/20/14 0631)     Charlynne Cousins  [facility] Pager [phone].  If 8PM-8AM, please contact night-coverage at [email or web address], password [facility] 07/20/2014, 8:11 AM  LOS: 19 days

## 2014-07-21 DIAGNOSIS — E46 Unspecified protein-calorie malnutrition: Secondary | ICD-10-CM

## 2014-07-21 DIAGNOSIS — K6389 Other specified diseases of intestine: Principal | ICD-10-CM

## 2014-07-21 LAB — BASIC METABOLIC PANEL
Anion gap: 11 (ref 5–15)
BUN: 30 mg/dL — ABNORMAL HIGH (ref 6–23)
CALCIUM: 9 mg/dL (ref 8.4–10.5)
CO2: 25 mEq/L (ref 19–32)
Chloride: 102 mEq/L (ref 96–112)
Creatinine, Ser: 0.76 mg/dL (ref 0.50–1.35)
GFR calc Af Amer: >90 mL/min (ref >90)
GFR calc non Af Amer: 81 mL/min — ABNORMAL LOW (ref >90)
Glucose, Bld: 112 mg/dL — ABNORMAL HIGH (ref 70–99)
Potassium: 4.7 mEq/L (ref 3.7–5.3)
Sodium: 138 mEq/L (ref 137–147)

## 2014-07-21 LAB — GLUCOSE, CAPILLARY
GLUCOSE-CAPILLARY: 134 mg/dL — AB (ref 70–99)
GLUCOSE-CAPILLARY: 147 mg/dL — AB (ref 70–99)
GLUCOSE-CAPILLARY: 138 mg/dL — AB (ref 70–99)
Glucose-Capillary: 145 mg/dL — ABNORMAL HIGH (ref 70–99)

## 2014-07-21 NOTE — Progress Notes (Signed)
CSW [employer] forces technical officer) spoke with pt wife over the phone and provided with bed offers. She would like to accept bed at [facility]. CSW left voicemail for facility admissions to notify of bed acceptance and potential for dc tomorrow. Pt wife also made aware that pt could be ready for dc to facility tomorrow.  [place], [place]

## 2014-07-21 NOTE — Progress Notes (Signed)
UR completed Anneka Studer K. Alson Mcpheeters, RN, BSN, [place], CCM  07/21/2014 12:29 PM

## 2014-07-21 NOTE — Progress Notes (Signed)
[facility] PROGRESS NOTE Interim History: [name] is a 80 y.o. male with h/o PVD, COPD on home O2, CAD, A-fib, DM 2, HTN, chronic left pleural effusion, CVA with left arm weakness, PVD- femoral artery stent and claudication of calf muscles and Macular degeneration. The patient presents to the hospital with nausea, vomiting and diarrhea and is found to have gastric and intestinal pneumatosis. He was started on broad spectrum antibiotics due to suspected gastroenteritis and managed with NG tube suctioning. As improvement was noted, tube feeds were initiated on 10/15 along with Reglan for gastroparesis however, as he did not tolerate the feeds, NG was switched back to suction on 10/17. Nasojejunal tube placed on 10/19 by IR and tube feeds resumed.    Assessment/Plan: Pneumatosis intestinalis/Gastric dysmotility: - S/P Nasojejunal tube by IR on 10-19. Started on tube feeding 07-11-09.27.2015. - Repeat CT reveals resolution of pneumatosis. - Started on soft diet. With improvement on intake now up to 50-65% consistently.Goal > 90%. - Received 8 days of zosyn And 6 fluconazole.  - Surgery consult appreciated- no need for surgery at this time  - Endoscopy: was normal.  - Continue with Protonix, GI prophylaxis.  - appreciate Gi assistance.  Gastroenteritis: - Supportive care with fluids, antiemetics and pain control  - C.dif negative.  Acute confusional state: - Resolved.  - Avoid BZ's and/or narcotics.  Hypoglycemia:  - Started on D 5 half NS with improvement. - Hypoglycemia was likely secondary to use of insulin in setting of poor by mouth intake  Hyponatremia/dehydration: - Despite receiving tube feeds, he appears to be dehydrated again. - re-start IV fluids today and follow.  Acute renal failure:  - Prerenal with severe dehydration secondary to high gastric output. - Improved with hydration.  Hypokalemia /hyperkalemia: - Replenished with KCl and now hyperkalemic   - We'll hydrate and repeat potassium this evening.  Afib  - Held betablocker on admision, was cover with IV. - Coreg has been resume. - Is not on anticoagulation at home, - Xarelto on hold secondary to anemia with positive Hemoccult on 4.2015. - can resume as an outpatient, Hbg has remained stable. Follow up with cardiology as an outpatinet.  COPD  Prn nebs   Recurrent left pleural effusion: Chronic - followed by thoracic surgery requiring thoracentesis. Non malignant effusion.   HTN  Cont Coreg   DM  Hold metformin. SSI (sensitive )    Code Status: no intubation  Family Communication: with wife at bedside  Disposition Plan: SNF  DVT prophylaxis: s/q heparin    Consultants:  GI  Procedures:  BJ tube  CT abdomen  Antibiotics:  Zosyn 10.10>>10.18  Diflucan 10.10>>10.15  10.10 rocephin 10.10>>10.10.2015  HPI/Subjective: Tolerating diet about 50 % of diet  Objective: Filed Vitals:   07/20/14 1429 07/20/14 1604 07/20/14 2024 07/21/14 0544  BP: 94/68 101/72 102/52 108/63  Pulse: 83 87 100 81  Temp: 98.6 F (37 C)  98.1 F (36.7 C) 98 F (36.7 C)  TempSrc: Oral  Oral Oral  Resp: 16   18  Height:      Weight:    59.7 kg (131 lb 9.8 oz)  SpO2: 96%  95% 95%    Intake/Output Summary (Last 24 hours) at 07/21/14 0752 Last data filed at 07/21/14 0545  Gross per 24 hour  Intake    580 ml  Output   1815 ml  Net  -1235 ml   Filed Weights   07/19/14 0527 07/20/14 0539 07/21/14 0544  Weight:  58 kg (127 lb 13.9 oz) 62.506 kg (137 lb 12.8 oz) 59.7 kg (131 lb 9.8 oz)    Exam:  General: Alert, awake, oriented x3, in no acute distress.  HEENT: No bruits, no goiter.  Heart: Regular rate and rhythm. Lungs: Good air movement, clear Abdomen: Soft, nontender, nondistended, positive bowel sounds.     Data Reviewed: Basic Metabolic Panel:  Recent Labs Lab 07/18/14 0820 07/19/14 0810 07/19/14 1540 07/20/14 0500  NA 134* 135* 135* 136*  K 5.6* 5.5*  5.3 4.7  CL 95* 97 98 100  CO2 29 28 24 26   GLUCOSE 158* 114* 137* 129*  BUN 34* 35* 32* 30*  CREATININE 0.89 0.79 0.76 0.82  CALCIUM 9.0 9.1 9.3 8.9   Liver Function Tests: No results found for this basename: AST, ALT, ALKPHOS, BILITOT, PROT, ALBUMIN,  in the last 168 hours No results found for this basename: LIPASE, AMYLASE,  in the last 168 hours No results found for this basename: AMMONIA,  in the last 168 hours CBC: No results found for this basename: WBC, NEUTROABS, HGB, HCT, MCV, PLT,  in the last 168 hours Cardiac Enzymes: No results found for this basename: CKTOTAL, CKMB, CKMBINDEX, TROPONINI,  in the last 168 hours BNP (last 3 results) No results found for this basename: PROBNP,  in the last 8760 hours CBG:  Recent Labs Lab 07/20/14 0610 07/20/14 1122 07/20/14 1647 07/20/14 2134 07/21/14 0625  GLUCAP 148* 211* 142* 135* 134*    Recent Results (from the past 240 hour(s))  CLOSTRIDIUM DIFFICILE BY PCR     Status: None   Collection Time    07/18/14  2:17 PM      Result Value Ref Range Status   C difficile by pcr NEGATIVE  NEGATIVE Final     Studies: No results found.  Scheduled Meds: . allopurinol  100 mg Oral BID  . antiseptic oral rinse  7 mL Mouth Rinse BID  . antiseptic oral rinse  7 mL Mouth Rinse q12n4p  . carvedilol  6.25 mg Oral BID WC  . chlorhexidine  15 mL Mouth Rinse BID  . cilostazol  50 mg Oral BID  . feeding supplement (ENSURE COMPLETE)  237 mL Oral BID WC  . feeding supplement (GLUCERNA SHAKE)  237 mL Oral Q24H  . feeding supplement (PRO-STAT SUGAR FREE 64)  30 mL Per Tube Q24H  . heparin subcutaneous  5,000 Units Subcutaneous 3 times per day  . insulin aspart  0-9 Units Subcutaneous TID WC  . pantoprazole  40 mg Oral BID  . sodium chloride  3 mL Intravenous Q12H  . tiotropium  18 mcg Inhalation Daily   Continuous Infusions: . sodium chloride 1,000 mL (07/20/14 1550)  . feeding supplement (JEVITY 1.2 CAL)       Charlynne Cousins  [facility] Pager [phone].  If 8PM-8AM, please contact night-coverage at [email or web address], password [facility] 07/21/2014, 7:52 AM  LOS: 20 days

## 2014-07-21 NOTE — Progress Notes (Signed)
Subjective: Eating more of his food.  Objective: Vital signs in last 24 hours: Temp:  [98 F (36.7 C)-98.6 F (37 C)] 98 F (36.7 C) (10/29 0544) Pulse Rate:  [81-100] 86 (10/29 1000) Resp:  [16-18] 18 (10/29 0544) BP: (94-108)/(52-72) 94/53 mmHg (10/29 1000) SpO2:  [94 %-96 %] 94 % (10/29 1000) Weight:  [59.7 kg (131 lb 9.8 oz)] 59.7 kg (131 lb 9.8 oz) (10/29 0544) Weight change: -2.806 kg (-6 lb 3 oz) Last BM Date: 07/18/14  PE: GEN:  NAD, chronically ill-appearing, but looks a bit more spry today  Lab Results: CBC    Component Value Date/Time   WBC 6.5 07/12/2014 1155   RBC 3.32* 07/12/2014 1155   RBC 2.41* 12/31/2013 2330   HGB 10.8* 07/12/2014 1155   HCT 32.8* 07/12/2014 1155   PLT PLATELETS APPEAR ADEQUATE 07/12/2014 1155   MCV 98.8 07/12/2014 1155   MCH 32.5 07/12/2014 1155   MCHC 32.9 07/12/2014 1155   RDW 14.4 07/12/2014 1155   LYMPHSABS 1.4 07/05/2014 0421   MONOABS 1.9* 07/05/2014 0421   EOSABS 0.0 07/05/2014 0421   BASOSABS 0.0 07/05/2014 0421   CMP     Component Value Date/Time   NA 138 07/21/2014 0856   K 4.7 07/21/2014 0856   CL 102 07/21/2014 0856   CO2 25 07/21/2014 0856   GLUCOSE 112* 07/21/2014 0856   BUN 30* 07/21/2014 0856   CREATININE 0.76 07/21/2014 0856   CALCIUM 9.0 07/21/2014 0856   PROT 6.6 07/05/2014 0421   ALBUMIN 2.6* 07/05/2014 0421   AST 23 07/05/2014 0421   ALT 16 07/05/2014 0421   ALKPHOS 74 07/05/2014 0421   BILITOT 0.6 07/05/2014 0421   GFRNONAA 81* 07/21/2014 0856   GFRAA >90 07/21/2014 0856   Assessment:  1. Functional gastroparesis, in setting of emphysematous gastritis, without evidence of gastric outlet obstruction on endoscopy. Emphysematous gastric changes (pneumatosis) have resolved on subsequent CT scan.  2. Feeding difficulties, in setting of #1 above, on nasojejunal tube feeds. Patient is retrying peroral diet, and continues to slowly improve in this regard.  Plan:  1.  Continue advancing diet and  encouraging patient to eat more. 2.  Continue nightly tube feeds at 40 cc/hr. 3.  If patient eats > 70% of food today, would likely be able to remove nasojejunal tube tomorrow. 4.  Continue OOBTC, work with PT. 5.  [facility] will follow.   Ajahnae Rathgeber M 07/21/2014, 10:30 AM

## 2014-07-22 LAB — GLUCOSE, CAPILLARY
GLUCOSE-CAPILLARY: 148 mg/dL — AB (ref 70–99)
Glucose-Capillary: 159 mg/dL — ABNORMAL HIGH (ref 70–99)

## 2014-07-22 MED ORDER — PANTOPRAZOLE SODIUM 40 MG PO TBEC
40.0000 mg | DELAYED_RELEASE_TABLET | Freq: Two times a day (BID) | ORAL | Status: DC
Start: 2014-07-22 — End: 2016-07-17

## 2014-07-22 NOTE — Discharge Summary (Signed)
Physician Discharge Summary  [name] [facility] SPQ:[id number] DOB: November 28, 1927 DOA: 07/01/2014  PCP: [name], MD  Admit date: 07/01/2014 Discharge date: 07/22/2014  Time spent: 35 minutes  Recommendations for Outpatient Follow-up:  1. Follow up with Gi in 2-4 weeks. 2. PCP in 2 weeks. 3. Cont PT at SNF.  Discharge Diagnoses:  Principal Problem:   Pneumatosis intestinalis Active Problems:   HTN (hypertension)   Diabetes mellitus, type 2   COPD (chronic obstructive pulmonary disease)   Atrial fibrillation   Pleural effusion, left   Hypokalemia   Dehydration   Gastroenteritis   Acute kidney injury   Hypernatremia   Gastric dysmotility   Discharge Condition: stable  Diet recommendation: puree Dys 1  Filed Weights   07/20/14 0539 07/21/14 0544 07/22/14 0457  Weight: 62.506 kg (137 lb 12.8 oz) 59.7 kg (131 lb 9.8 oz) 59.7 kg (131 lb 9.8 oz)    History of present illness:  80 y.o. male  has a past medical history of Dysrhythmia; Hypertension; Shortness of breath; Claudication of calf muscles (11/05/2012); GERD (gastroesophageal reflux disease); TIA (transient ischemic attack) (2010); Peripheral vascular disease; On home oxygen therapy; Neuromuscular disorder; Macular degeneration, bilateral; Coronary artery disease; COPD (chronic obstructive pulmonary disease); Type II diabetes mellitus; Stroke (05/2013); Arthritis; and Pneumonia. Presented with 2 day hx of nausea vomiting and diarrhea. His granddaughter have had a similar episode recently. Patient's wife given him some imodium with good relief. He continued to have some nausea and vomiting and was brought to ER. He was treated with Zofran with good results. K was noted down to 3.5 and was treated as well.    Hospital Course:  Pneumatosis intestinalis/Gastric dysmotility:  - started on empiric antibiotics Received 8 days of zosyn And 6 fluconazole.  - Ct abd done with results as below. Surgery consulted and recommended  conservative managemtn. - GI consulted. Recommended NPO, EGD on 10.14 showed Essentially normal examination. - Repeat Ct abd showed improvement of pneumatosis intestinalis. - S/P Nasojejunal tube by IR on 10-19. Started on tube feeding 07-11-09.27.2015.  - Started on soft diet. With improvement on intake now consistently increasing.Goal > 90%.  - Continue with Protonix, GI prophylaxis.   Gastroenteritis:  - Supportive care with fluids, antiemetics and pain control  - C.dif negative.   Acute confusional state:  - Resolved. Due to BZD'as - Avoid BZ's and/or narcotics.   Hypoglycemia:  - Started on D 5 half NS with improvement.  - Hypoglycemia was likely secondary to use of insulin in setting of poor by mouth intake.  Hyponatremia/dehydration:  - Despite receiving tube feeds, he appears to be dehydrated.  - re-start IV fluids with improvement.  Acute renal failure:  - Prerenal with severe dehydration secondary to high gastric output.  - Improved with hydration.   Hypokalemia /hyperkalemia:  - Replenished with KCl and now hyperkalemic  - We'll hydrate and repeat potassium this evening.   Procedures:  CT abd  IR NG tube  Consultations:  GI  surgery  Discharge Exam: Filed Vitals:   07/22/14 0457  BP: 102/69  Pulse: 89  Temp: 97.7 F (36.5 C)  Resp: 18    General: A&O x3 Cardiovascular: RRR Respiratory: good air movement CT B/L  Discharge Instructions You were cared for by a hospitalist during your hospital stay. If you have any questions about your discharge medications or the care you received while you were in the hospital after you are discharged, you can call the unit and asked to speak with  the hospitalist on call if the hospitalist that took care of you is not available. Once you are discharged, your primary care physician will handle any further medical issues. Please note that NO REFILLS for any discharge medications will be authorized once you are  discharged, as it is imperative that you return to your primary care physician (or establish a relationship with a primary care physician if you do not have one) for your aftercare needs so that they can reassess your need for medications and monitor your lab values.  Discharge Instructions   Diet - low sodium heart healthy    Complete by:  As directed      Increase activity slowly    Complete by:  As directed           Current Discharge Medication List    START taking these medications   Details  pantoprazole (PROTONIX) 40 MG tablet Take 1 tablet (40 mg total) by mouth 2 (two) times daily. Qty: 30 tablet, Refills: 0      CONTINUE these medications which have NOT CHANGED   Details  albuterol (PROVENTIL) (2.5 MG/3ML) 0.083% nebulizer solution Take 2.5 mg by nebulization 4 (four) times daily.    allopurinol (ZYLOPRIM) 100 MG tablet Take 100 mg by mouth 2 (two) times daily.     budesonide (PULMICORT) 0.5 MG/2ML nebulizer solution Take 0.5 mg by nebulization 2 (two) times daily as needed (for wheezing/shortness of breath).     carvedilol (COREG) 6.25 MG tablet Take 6.25 mg by mouth 2 (two) times daily with a meal.    cilostazol (PLETAL) 50 MG tablet Take 50 mg by mouth 2 (two) times daily.    colchicine 0.6 MG tablet Take 1 tablet (0.6 mg total) by mouth 2 (two) times daily. Qty: 10 tablet, Refills: 0    !! loratadine (CLARITIN) 10 MG tablet Take 10 mg by mouth daily.    !! loratadine (CLARITIN) 10 MG tablet Take 10 mg by mouth daily.    magnesium oxide (MAG-OX) 400 MG tablet Take 200 mg by mouth daily.    Melatonin 3 MG TABS Take 3 mg by mouth at bedtime.    metFORMIN (GLUCOPHAGE) 1000 MG tablet Take 500 mg by mouth 2 (two) times daily with a meal.    Multiple Vitamins-Minerals (OCUVITE PRESERVISION PO) Take 1 tablet by mouth daily.    ranitidine (ZANTAC) 300 MG tablet Take 300 mg by mouth at bedtime.    simvastatin (ZOCOR) 40 MG tablet Take 40 mg by mouth every evening.     STARCH-MALTO DEXTRIN (THICK-IT) POWD 1 scoop by Other route 3 (three) times daily with meals. To make liquids nectar thick    tiotropium (SPIRIVA) 18 MCG inhalation capsule Place 18 mcg into inhaler and inhale daily.    traMADol (ULTRAM) 50 MG tablet Take 1 tablet (50 mg total) by mouth every 6 (six) hours as needed for moderate pain. Qty: 30 tablet, Refills: 0    vitamin B-12 (CYANOCOBALAMIN) 1000 MCG tablet Take 1,000 mcg by mouth daily.     !! - Potential duplicate medications found. Please discuss with provider.     No Known Allergies Follow-up Information   Follow up with [place] Nurse, Physical Therapy, Home Health Aide Services to start within 24-48 hours of discharge)    Contact information:   [address] [place] [address] [phone]        The results of significant diagnostics from this hospitalization (including imaging, microbiology, ancillary  and laboratory) are listed below for reference.    Significant Diagnostic Studies: Dg Chest 2 View  07/01/2014   CLINICAL DATA:  Acute onset of nausea, vomiting and diarrhea. Generalized weakness and difficulty swallowing.  EXAM: CHEST  2 VIEW  COMPARISON:  Chest radiograph performed 05/12/2014  FINDINGS: The lungs are well-aerated. Left basilar airspace opacification raises concern for mild pneumonia. A calcified granuloma is again noted at the right lung base. No definite pleural effusion or pneumothorax is seen.  The heart is borderline normal in size. No acute osseous abnormalities are seen.  IMPRESSION: Left basilar airspace opacification raises concern for mild pneumonia.   Electronically Signed   By: Garald Balding M.D.   On: 07/01/2014 23:31   Dg Abd 1 View  07/11/2014   CLINICAL DATA:  80 year old male in need of enteral access for feeding. Feeding tube placement.  EXAM: ABDOMEN - 1 VIEW  COMPARISON:  Abdominal radiograph 06/25/2014.  FINDINGS: Per report from the  technologist a 12 French feeding tube was placed via a nasogastric approach under fluoroscopy (2 min and 19 seconds of fluoroscopy utilized). The fluoroscopic image captured was taken after injection of 30 mL of Omnipaque 300 into the tube. This confirms placement of the tip of the tube in the proximal jejunum just distal to the ligament of Treitz. A nasogastric tube is also seen projecting over the stomach.  IMPRESSION: 1. Proper placement of feeding tube in the proximal jejunum just distal to the ligament of Treitz.   Electronically Signed   By: Vinnie Langton M.D.   On: 07/11/2014 11:23   Dg Abd 1 View  07/05/2014   CLINICAL DATA:  Abdominal pain.  EXAM: ABDOMEN - 1 VIEW  COMPARISON:  July 03, 2014.  FINDINGS: Normal bowel gas pattern is noted. Nasogastric tube tip is in proximal stomach. Moderate amount of residual contrast remains within the cecum. There is decreased amount of contrast in the remaining portions of the colon. Phlebolith is noted in the left side of the pelvis. Contrast is noted within the urinary bladder.  IMPRESSION: No evidence of bowel obstruction or ileus. Moderate amount of residual contrast remains within the cecum. Intravenous contrast is also noted within the urinary bladder.   Electronically Signed   By: Sabino Dick M.D.   On: 07/05/2014 08:30   Ct Head Wo Contrast  07/12/2014   CLINICAL DATA:  Encephalopathy.  EXAM: CT HEAD WITHOUT CONTRAST  TECHNIQUE: Contiguous axial images were obtained from the base of the skull through the vertex without intravenous contrast.  COMPARISON:  CT scan of July 07, 2013.  FINDINGS: Bony calvarium appears intact. Diffuse cortical atrophy is noted. Old lacunar infarctions are noted in the right basal ganglia. Mild chronic ischemic white matter disease is noted. No evidence of mass lesion, acute infarction or acute hemorrhage is noted. There is the suggestion of a large polyp in the right nasal cavity and nasopharynx.  IMPRESSION: Diffuse  cortical atrophy. Mild chronic ischemic white matter disease. No acute intracranial abnormality seen.  There is the suggestion of a soft tissue abnormality in the right nasal cavity and nasopharynx which may represent large nasal polyp.   Electronically Signed   By: Sabino Dick M.D.   On: 07/12/2014 14:55   Ct Abdomen W Contrast  07/15/2014   CLINICAL DATA:  Gastric mural pneumatosis. Pt poor historian. Pt denies abd pain or complaints. Dr Cristina Gong Notes:updated CT scan of the stomach to verify that his gastric mural pneumatosis has resolved, and to  verify the absence of any significant reaccumulation of fluid within the stomach lumen. It is recalled that his endoscopy, performed several days after the CT that showed pneumatosis, showed a completely normal mucosal appearance of the stomach.  EXAM: CT ABDOMEN WITH CONTRAST  TECHNIQUE: Multidetector CT imaging of the abdomen was performed using the standard protocol following bolus administration of intravenous contrast.  CONTRAST:  62mL OMNIPAQUE IOHEXOL 300 MG/ML  SOLN  COMPARISON:  07/02/2014  FINDINGS: Emphysematous changes in the lung bases. Focal consolidation in right greater than left lung base. Small bilateral pleural effusions. Calcified granulomas in the lung bases. Calcification of coronary arteries.  Previous changes of hepatic portal venous pneumatosis and a gastric wall pneumatosis have resolved in the interval. The stomach is now decompressed. An enteric feeding tube is coiled in the stomach with is tip present in the jejunum. Small bowel are decompressed. Gas and stool in the colon without distention. Contrast material present in the colon suggesting no evidence of obstruction.  Liver, spleen, pancreas, adrenal glands, inferior vena cava appears normal. Small cysts in the kidneys. No solid mass or hydronephrosis identified. Calcification of the abdominal aorta and branch vessels. Calcification in the mesenteric artery likely results in significant  stenosis although the distal vessel is patent. Retroperitoneal lymph nodes are present without pathologic enlargement. No free air is demonstrated in the abdomen.  IMPRESSION: Since the previous study, there has been interval resolution of previous changes of gastric wall pneumatosis and hepatic portal venous gas. Stomach is now decompressed.   Electronically Signed   By: Lucienne Capers M.D.   On: 07/15/2014 23:19   Ct Abdomen Pelvis W Contrast  07/02/2014   CLINICAL DATA:  Vomited multiple times since last night. Diarrhea. Initial encounter.  EXAM: CT ABDOMEN AND PELVIS WITH CONTRAST  TECHNIQUE: Multidetector CT imaging of the abdomen and pelvis was performed using the standard protocol following bolus administration of intravenous contrast.  CONTRAST:  137mL OMNIPAQUE IOHEXOL 300 MG/ML  SOLN  COMPARISON:  None.  FINDINGS: A trace left pleural effusion is noted. Mild bibasilar atelectasis is seen. Diffuse coronary artery calcifications are seen.  The stomach is significantly distended, with decompression of the pylorus and first segment of the duodenum. There appears to be focal soft tissue edema at the pylorus, of uncertain significance. The stomach is filled with fluid and air. There is diffuse gastric emphysema, of uncertain significance. In addition, portal venous gas is noted within the anterior aspect of the liver.  This could reflect infectious emphysematous gastritis, ischemia, or gastric outlet obstruction with markedly elevated pressures. Would correlate with the patient's symptoms. Though calcific atherosclerotic disease is seen at the upper abdomen, there is no obvious ancillary finding to suggest gastric ischemia.  Multiple areas of increased attenuation are noted within the liver, of uncertain significance. A 0.8 cm hypodensity within the left hepatic lobe is nonspecific but may reflect a small cyst. The spleen is unremarkable in appearance. The pancreas and adrenal glands are within normal  limits.  Scattered left renal cysts are noted. The right kidney is unremarkable in appearance. There is no evidence of hydronephrosis. No renal or ureteral stones are seen. Minimal nonspecific perinephric stranding is noted bilaterally.  No free fluid is identified. The small bowel is decompressed and grossly unremarkable. The stomach is within normal limits. No acute vascular abnormalities are seen. Diffuse calcification is seen along the abdominal aorta and its branches, including at the origin of the right renal artery. There is mild ectasia of the distal  abdominal aorta, without evidence of aneurysmal dilatation.  The appendix is not definitely seen; there is no evidence for appendicitis. The colon is decompressed and grossly unremarkable in appearance.  The bladder is mildly distended and grossly unremarkable. The prostate is enlarged, measuring 5.2 cm, with mildly heterogeneous enhancement. No inguinal lymphadenopathy is seen.  No acute osseous abnormalities are identified. Chronic bilateral pars defects are seen at L5, without significant anterolisthesis. There is suggestion of osseous fusion at L5-S1.  IMPRESSION: 1. Significantly distended stomach, filled with fluid and air. There is decompression of the pylorus and first segment of the duodenum behind the stomach. Diffuse gastric emphysema noted. Portal venous gas seen at the anterior aspect of the liver. 2. This could reflect infectious emphysematous gastritis, ischemia or gastric outlet obstruction with markedly elevated pressures. No obvious ancillary finding seen to suggest gastric ischemia, though calcific atherosclerotic disease is seen at the upper abdomen. Would correlate with the patient's symptoms, and consider carefully placing an enteric tube to decompress the stomach. 3. Diffuse calcification along the abdominal aorta and its branches, including at the origin of the right renal artery. Mild ectasia of the distal abdominal aorta. 4. Trace left  pleural effusion, with mild bibasilar atelectasis. 5. Diffuse coronary artery calcifications seen. 6. Multiple areas of increased attenuation noted in the liver, of uncertain significance. Question of small hepatic cyst. The appearance of the liver could conceivably reflect compression of the liver due to the stomach, but dynamic liver protocol MRI or CT could be considered for further evaluation on an elective nonemergent basis. 7. Enlarged prostate noted, with mildly heterogeneous enhancement. The appearance is relatively stable from 2006. 8. Chronic bilateral pars defects at L5, without significant anterolisthesis. Suggestion of underlying osseous fusion at L5-S1.  These results were called by telephone at the time of interpretation on 07/02/2014 at 2:52 am to Junius Creamer PA, who verbally acknowledged these results.   Electronically Signed   By: Garald Balding M.D.   On: 07/02/2014 03:04   Dg Abd Portable 1v  07/13/2014   CLINICAL DATA:  Panda tube placement.  EXAM: PORTABLE ABDOMEN - 1 VIEW  COMPARISON:  Radiographs 07/11/2014 and 07/05/2014.  FINDINGS: 1755 hr. The course of the feeding tube is unchanged from the placement images. The tip is in the left upper quadrant, likely in the proximal jejunum. The visualized bowel gas pattern is nonobstructive. There is grossly stable blunting of the right costophrenic angle.  IMPRESSION: The feeding tube appears unchanged with the tip in the left upper quadrant, likely in the proximal jejunum.   Electronically Signed   By: Camie Patience M.D.   On: 07/13/2014 18:13   Dg Abd Portable 1v  07/03/2014   CLINICAL DATA:  Nasogastric tube placement.  EXAM: PORTABLE ABDOMEN - 1 VIEW  COMPARISON:  CT on 07/02/2014  FINDINGS: Nasogastric tube extends into the region of the fundus of the stomach. No evidence of bowel obstruction or significant ileus. Contrast is present in the bladder. There also are faint persistent nephrograms present related to administration of IV  contrast yesterday.  IMPRESSION: Nasogastric tube extends into the proximal stomach.   Electronically Signed   By: Aletta Edouard M.D.   On: 07/03/2014 10:39   Dg Addison Bailey G Tube Plc W/fl-no Rad  07/11/2014   CLINICAL DATA:    NASO G TUBE PLACEMENT WITH FLUORO  Fluoroscopy was utilized by the requesting physician.  No radiographic  interpretation.    Dg Ugi W/water Sol Cm  07/04/2014   CLINICAL  DATA:  Gastric emphysema.  Portal venous gas.  EXAM: WATER SOLUBLE UPPER GI SERIES  TECHNIQUE: Single-column upper GI series was performed using water soluble contrast.  CONTRAST:  134mL OMNIPAQUE IOHEXOL 300 MG/ML  SOLN  COMPARISON:  07/02/2014  FLUOROSCOPY TIME:  1 min, 25 seconds  FINDINGS: Because of the gastric emphysema and portal venous gas, I elected to use water-soluble contrast medium rather than barium in case there was perforation or emphysematous gastritis from gastric wall necrosis, or in case urgent gastric surgery was to be required.  I aspirated about 150 cc of bilious material from the nasogastric tube in order to empty the stomach.  A total of 180 cc of Omnipaque 300 was injected through the nasogastric tube.  Equivocal rugal wall thickening. The gastric emphysema shown on CT is less readily apparent on fluoroscopic images but that does not mean that it has necessarily resolved.  Aside from slightly exaggerated lesser curvature of the stomach, no gastric morphologic abnormality is identified. Contrast flowed freely from the stomach into the proximal duodenum, without evidence of gastric outlet obstruction. No reflux into the esophagus was noted during the exam.  IMPRESSION: 1. The contour of the stomach is unremarkable aside from mild exaggeration of the lesser curvature. No gastric outlet obstruction is demonstrated ; this corresponds to the finding of bilious gastric material which would be unusual in the setting of gastric outlet obstruction. 2. This was not a standard upper GI examination. The  patient felt back pain, was delirious, and was not able to turn except very slight amounts, and the exam was performed in the supine in slightly oblique positions. The esophagus and pharynx were not assessed.   Electronically Signed   By: Sherryl Barters M.D.   On: 07/04/2014 14:38    Microbiology: Recent Results (from the past 240 hour(s))  CLOSTRIDIUM DIFFICILE BY PCR     Status: None   Collection Time    07/18/14  2:17 PM      Result Value Ref Range Status   C difficile by pcr NEGATIVE  NEGATIVE Final     Labs: Basic Metabolic Panel:  Recent Labs Lab 07/18/14 0820 07/19/14 0810 07/19/14 1540 07/20/14 0500 07/21/14 0856  NA 134* 135* 135* 136* 138  K 5.6* 5.5* 5.3 4.7 4.7  CL 95* 97 98 100 102  CO2 29 28 24 26 25   GLUCOSE 158* 114* 137* 129* 112*  BUN 34* 35* 32* 30* 30*  CREATININE 0.89 0.79 0.76 0.82 0.76  CALCIUM 9.0 9.1 9.3 8.9 9.0   Liver Function Tests: No results found for this basename: AST, ALT, ALKPHOS, BILITOT, PROT, ALBUMIN,  in the last 168 hours No results found for this basename: LIPASE, AMYLASE,  in the last 168 hours No results found for this basename: AMMONIA,  in the last 168 hours CBC: No results found for this basename: WBC, NEUTROABS, HGB, HCT, MCV, PLT,  in the last 168 hours Cardiac Enzymes: No results found for this basename: CKTOTAL, CKMB, CKMBINDEX, TROPONINI,  in the last 168 hours BNP: BNP (last 3 results) No results found for this basename: PROBNP,  in the last 8760 hours CBG:  Recent Labs Lab 07/21/14 0625 07/21/14 1145 07/21/14 1648 07/21/14 2119 07/22/14 0618  GLUCAP 134* 147* 145* 138* 148*       Signed:  Charlynne Cousins  [facility] 07/22/2014, 8:17 AM

## 2014-07-22 NOTE — Progress Notes (Signed)
Subjective: No complaints.  Objective: Vital signs in last 24 hours: Temp:  [97.7 F (36.5 C)-98.8 F (37.1 C)] 97.7 F (36.5 C) (10/30 0457) Pulse Rate:  [82-93] 89 (10/30 0457) Resp:  [18] 18 (10/30 0457) BP: (94-103)/(50-69) 102/69 mmHg (10/30 0457) SpO2:  [93 %-94 %] 94 % (10/30 0457) Weight:  [59.7 kg (131 lb 9.8 oz)] 59.7 kg (131 lb 9.8 oz) (10/30 0457) Weight change: 0 kg (0 lb) Last BM Date: 07/18/14 (per chart)  PE: GEN:  NAD, NJT in right nare  Lab Results: CBC    Component Value Date/Time   WBC 6.5 07/12/2014 1155   RBC 3.32* 07/12/2014 1155   RBC 2.41* 12/31/2013 2330   HGB 10.8* 07/12/2014 1155   HCT 32.8* 07/12/2014 1155   PLT PLATELETS APPEAR ADEQUATE 07/12/2014 1155   MCV 98.8 07/12/2014 1155   MCH 32.5 07/12/2014 1155   MCHC 32.9 07/12/2014 1155   RDW 14.4 07/12/2014 1155   LYMPHSABS 1.4 07/05/2014 0421   MONOABS 1.9* 07/05/2014 0421   EOSABS 0.0 07/05/2014 0421   BASOSABS 0.0 07/05/2014 0421   CMP     Component Value Date/Time   NA 138 07/21/2014 0856   K 4.7 07/21/2014 0856   CL 102 07/21/2014 0856   CO2 25 07/21/2014 0856   GLUCOSE 112* 07/21/2014 0856   BUN 30* 07/21/2014 0856   CREATININE 0.76 07/21/2014 0856   CALCIUM 9.0 07/21/2014 0856   PROT 6.6 07/05/2014 0421   ALBUMIN 2.6* 07/05/2014 0421   AST 23 07/05/2014 0421   ALT 16 07/05/2014 0421   ALKPHOS 74 07/05/2014 0421   BILITOT 0.6 07/05/2014 0421   GFRNONAA 81* 07/21/2014 0856   GFRAA >90 07/21/2014 0856   Assessment:  1.  Feeding difficulties, improving.  Plan:  1.  If patient is consuming > 60-70% of meals, I feel it's ok to remove nasojejunal feeding tube and transition to skilled nursing facility. 2.  No further inpatient GI monitoring or testing is needed at the present time. 3.  Will sign-off; happy to follow-up with patient as outpatient if desired; thank you for the consultation.  Landry Dyke 07/22/2014, 8:18 AM

## 2014-07-22 NOTE — Progress Notes (Signed)
Per MD note and order pt to have tube feeding overnight at 40cc/hr. Tube feeding not connected currently. Ordered to start at 9PM on 07/21/14. Residual checked 59ml, tube placement verified by auscultation. Tube feeding connected.

## 2014-07-22 NOTE — Progress Notes (Signed)
Called report to [place]. Spoke with Suan Halter and gave her report. SBAR used to give report. Call back number given to [place]. Ambulance will be here at 3 to pick him up

## 2014-07-25 ENCOUNTER — Other Ambulatory Visit: Payer: Self-pay | Admitting: *Deleted

## 2014-07-25 ENCOUNTER — Encounter: Payer: Self-pay | Admitting: Internal Medicine

## 2014-07-25 ENCOUNTER — Non-Acute Institutional Stay (SKILLED_NURSING_FACILITY): Payer: 59 | Admitting: Internal Medicine

## 2014-07-25 DIAGNOSIS — I482 Chronic atrial fibrillation, unspecified: Secondary | ICD-10-CM

## 2014-07-25 DIAGNOSIS — Z8719 Personal history of other diseases of the digestive system: Secondary | ICD-10-CM

## 2014-07-25 DIAGNOSIS — E785 Hyperlipidemia, unspecified: Secondary | ICD-10-CM

## 2014-07-25 DIAGNOSIS — E119 Type 2 diabetes mellitus without complications: Secondary | ICD-10-CM

## 2014-07-25 DIAGNOSIS — K529 Noninfective gastroenteritis and colitis, unspecified: Secondary | ICD-10-CM

## 2014-07-25 DIAGNOSIS — E162 Hypoglycemia, unspecified: Secondary | ICD-10-CM | POA: Insufficient documentation

## 2014-07-25 DIAGNOSIS — K6389 Other specified diseases of intestine: Secondary | ICD-10-CM

## 2014-07-25 DIAGNOSIS — K3189 Other diseases of stomach and duodenum: Secondary | ICD-10-CM

## 2014-07-25 DIAGNOSIS — F05 Delirium due to known physiological condition: Secondary | ICD-10-CM

## 2014-07-25 MED ORDER — TRAMADOL HCL 50 MG PO TABS: 50.0000 mg | ORAL_TABLET | Freq: Four times a day (QID) | ORAL | Status: DC | PRN

## 2014-07-25 NOTE — Assessment & Plan Note (Signed)
Well controlled on zocor. 

## 2014-07-25 NOTE — Assessment & Plan Note (Signed)
-   Resolved. Due to BZD'as - Avoid BZ's and/or narcotics

## 2014-07-25 NOTE — Assessment & Plan Note (Signed)
Noted, imp to remember

## 2014-07-25 NOTE — Progress Notes (Signed)
MRN: [id number] Name: [name]  Sex: male Age: 80 y.o. DOB: May 04, 1928  [place] #: Helene Kelp Facility/Room: 116 Level Of Care: SNF Provider: Inocencio Homes D Emergency Contacts: Extended Emergency Contact Information Primary Emergency Contact: Dick,Mable Address: [address]          [place], [place] [address] [place] [employer] [place] Phone: [phone] Relation: Spouse Secondary Emergency Contact: Marlyne Beards [place] Mobile Phone: [phone] Relation: Daughter    Allergies: Review of patient's allergies indicates no known allergies.  Chief Complaint  Patient presents with  . New Admit To SNF    HPI: Patient is 80 y.o. male who is admitted to SNF for generalized weakness for OT/PT after n/v/d from GI and intestinal pneumatosis.  Past Medical History  Diagnosis Date  . Dysrhythmia     atrial fibrilation  . Hypertension   . Shortness of breath   . Claudication of calf muscles 11/05/2012    right calf  . GERD (gastroesophageal reflux disease)   . TIA (transient ischemic attack) 2010  . Peripheral vascular disease   . On home oxygen therapy     "2L; w/activity" (02/24/2014)  . Neuromuscular disorder     dizziness  . Macular degeneration, bilateral     "has had shots in both eyes"  . Coronary artery disease   . COPD (chronic obstructive pulmonary disease)   . Type II diabetes mellitus   . Stroke 05/2013    "can't use left arm fully since"  . Arthritis     "legs" (02/24/2014)  . Pneumonia     "5 times back to back recently (just finished antibiiotic) " (02/24/2014)    Past Surgical History  Procedure Laterality Date  . Removal of pleural drainage catheter Left 05/03/2014    Procedure: REMOVAL OF PLEURAL DRAINAGE CATHETER;  Surgeon: Ivin Poot, MD;  Location: [place];  Service: Thoracic;  Laterality: Left;  . Foot surgery Left     due to broken foot years ago  . Esophagogastroduodenoscopy N/A 11/02/2013    Procedure:  ESOPHAGOGASTRODUODENOSCOPY (EGD);  Surgeon: Cleotis Nipper, MD;  Location: [facility] ENDOSCOPY;  Service: Endoscopy;  Laterality: N/A;  . Vascular surgery      stents to legs  . Cardiac catheterization    . Transurethral resection of prostate    . Femoral artery stent Left 10/2012  . Chest tube insertion Left 02/24/2014    Procedure: INSERTION PLEURAL DRAINAGE CATHETER;  Surgeon: Ivin Poot, MD;  Location: [place];  Service: Thoracic;  Laterality: Left;  . Esophagogastroduodenoscopy (egd) with propofol N/A 07/06/2014    Procedure: ESOPHAGOGASTRODUODENOSCOPY (EGD) WITH PROPOFOL;  Surgeon: Cleotis Nipper, MD;  Location: [place];  Service: Endoscopy;  Laterality: N/A;      Medication List       This list is accurate as of: 07/25/14  7:43 PM.  Always use your most recent med list.               albuterol (2.5 MG/3ML) 0.083% nebulizer solution  Commonly known as:  PROVENTIL  Take 2.5 mg by nebulization 4 (four) times daily.     allopurinol 100 MG tablet  Commonly known as:  ZYLOPRIM  Take 100 mg by mouth 2 (two) times daily.     budesonide 0.5 MG/2ML nebulizer solution  Commonly known as:  PULMICORT  Take 0.5 mg by nebulization 2 (two) times daily as needed (for wheezing/shortness of breath).     carvedilol 6.25 MG tablet  Commonly  known as:  COREG  Take 6.25 mg by mouth 2 (two) times daily with a meal.     cilostazol 50 MG tablet  Commonly known as:  PLETAL  Take 50 mg by mouth 2 (two) times daily.     colchicine 0.6 MG tablet  Take 1 tablet (0.6 mg total) by mouth 2 (two) times daily.     loratadine 10 MG tablet  Commonly known as:  CLARITIN  Take 10 mg by mouth daily.     loratadine 10 MG tablet  Commonly known as:  CLARITIN  Take 10 mg by mouth daily.     magnesium oxide 400 MG tablet  Commonly known as:  MAG-OX  Take 200 mg by mouth daily.     Melatonin 3 MG Tabs  Take 3 mg by mouth at bedtime.     metFORMIN 1000 MG tablet  Commonly known as:  GLUCOPHAGE   Take 500 mg by mouth 2 (two) times daily with a meal.     OCUVITE PRESERVISION PO  Take 1 tablet by mouth daily.     pantoprazole 40 MG tablet  Commonly known as:  PROTONIX  Take 1 tablet (40 mg total) by mouth 2 (two) times daily.     ranitidine 300 MG tablet  Commonly known as:  ZANTAC  Take 300 mg by mouth at bedtime.     simvastatin 40 MG tablet  Commonly known as:  ZOCOR  Take 40 mg by mouth every evening.     THICK-IT Powd  Generic drug:  STARCH-MALTO DEXTRIN  1 scoop by Other route 3 (three) times daily with meals. To make liquids nectar thick     tiotropium 18 MCG inhalation capsule  Commonly known as:  SPIRIVA  Place 18 mcg into inhaler and inhale daily.     traMADol 50 MG tablet  Commonly known as:  ULTRAM  Take 1 tablet (50 mg total) by mouth every 6 (six) hours as needed for moderate pain.     vitamin B-12 1000 MCG tablet  Commonly known as:  CYANOCOBALAMIN  Take 1,000 mcg by mouth daily.        No orders of the defined types were placed in this encounter.    Immunization History  Administered Date(s) Administered  . Influenza Split 06/23/2013  . Pneumococcal Polysaccharide-23 12/05/2013    History  Substance Use Topics  . Smoking status: Former Smoker -- 3.00 packs/day for 35 years    Types: Cigarettes    Quit date: 09/23/1981  . Smokeless tobacco: Never Used  . Alcohol Use: Yes     Comment: QUITS YEARS AGO    Family history is noncontributory    Review of Systems  DATA OBTAINED: from patient GENERAL:  no fevers, fatigue, appetite changes; no c/o SKIN: No itching, rash or wounds EYES: No eye pain, redness, discharge EARS: No earache, tinnitus, change in hearing NOSE: No congestion, drainage or bleeding  MOUTH/THROAT: No mouth or tooth pain, No sore throat RESPIRATORY: No cough, wheezing, SOB CARDIAC: No chest pain, palpitations, lower extremity edema  GI: No abdominal pain, No N/V/D or constipation, No heartburn or reflux  GU: No  dysuria, frequency or urgency, or incontinence  MUSCULOSKELETAL: No unrelieved bone/joint pain NEUROLOGIC: No headache, dizziness or focal weakness PSYCHIATRIC: No overt anxiety or sadness, No behavior issue.   Filed Vitals:   07/25/14 1302  BP: 164/58  Pulse: 80  Temp: 97.9 F (36.6 C)  Resp: 16    Physical Exam  GENERAL APPEARANCE:  Alert, minconversant,  No acute distress.  SKIN: No diaphoresis rash HEAD: Normocephalic, atraumatic  EYES: Conjunctiva/lids clear. Pupils round, reactive. EOMs intact.  EARS: External exam WNL, canals clear. Hearing grossly normal.  NOSE: No deformity or discharge.  MOUTH/THROAT: Lips w/o lesions  RESPIRATORY: Breathing is even, unlabored. Lung sounds are clear   CARDIOVASCULAR: Heart irreg no murmurs, rubs or gallops. No peripheral edema.   GASTROINTESTINAL: Abdomen is soft, non-tender, not distended w/ normal bowel sounds. GENITOURINARY: Bladder non tender, not distended  MUSCULOSKELETAL: No abnormal joints or musculature NEUROLOGIC:  Cranial nerves 2-12 grossly intact  PSYCHIATRIC: Mood and affect appropriate to situation, no behavioral issues  Patient Active Problem List   Diagnosis Date Noted  . Acute confusional state 07/25/2014  . Hypoglycemia 07/25/2014  . Gastric dysmotility 07/20/2014  . Acute kidney injury 07/05/2014  . Hypernatremia 07/05/2014  . Pneumatosis intestinalis 07/03/2014  . Hypokalemia 07/02/2014  . Dehydration 07/02/2014  . Gastroenteritis 07/02/2014  . Pleural effusion on left 02/24/2014  . Protein-calorie malnutrition, severe 01/03/2014  . UTI (lower urinary tract infection) 12/31/2013  . HCAP (healthcare-associated pneumonia) 12/31/2013  . Aspiration pneumonia 12/31/2013  . Acute blood loss anemia 12/31/2013  . Pleural effusion, left 12/21/2013  . Dysphagia, unspecified(787.20) 12/21/2013  . Pneumonia 10/31/2013  . CAP (community acquired pneumonia) 10/31/2013  . TIA (transient ischemic attack) 07/06/2013   . PAD (peripheral artery disease) 07/06/2013  . HTN (hypertension) 07/06/2013  . Diabetes mellitus, type 2 07/06/2013  . HLD (hyperlipidemia) 07/06/2013  . COPD (chronic obstructive pulmonary disease) 07/06/2013  . Atrial fibrillation 07/06/2013  . History of GI bleed 07/06/2013  . Cardiomegaly 07/06/2013  . CVA (cerebral infarction) 07/06/2013    CBC    Component Value Date/Time   WBC 6.5 07/12/2014 1155   RBC 3.32* 07/12/2014 1155   RBC 2.41* 12/31/2013 2330   HGB 10.8* 07/12/2014 1155   HCT 32.8* 07/12/2014 1155   PLT PLATELETS APPEAR ADEQUATE 07/12/2014 1155   MCV 98.8 07/12/2014 1155   LYMPHSABS 1.4 07/05/2014 0421   MONOABS 1.9* 07/05/2014 0421   EOSABS 0.0 07/05/2014 0421   BASOSABS 0.0 07/05/2014 0421    CMP     Component Value Date/Time   NA 138 07/21/2014 0856   K 4.7 07/21/2014 0856   CL 102 07/21/2014 0856   CO2 25 07/21/2014 0856   GLUCOSE 112* 07/21/2014 0856   BUN 30* 07/21/2014 0856   CREATININE 0.76 07/21/2014 0856   CALCIUM 9.0 07/21/2014 0856   PROT 6.6 07/05/2014 0421   ALBUMIN 2.6* 07/05/2014 0421   AST 23 07/05/2014 0421   ALT 16 07/05/2014 0421   ALKPHOS 74 07/05/2014 0421   BILITOT 0.6 07/05/2014 0421   GFRNONAA 81* 07/21/2014 0856   GFRAA >90 07/21/2014 0856    Assessment and Plan  Pneumatosis intestinalis Gastric dysmotility:  - started on empiric antibiotics Received 8 days of zosyn And 6 fluconazole.  - Ct abd done with results as below. Surgery consulted and recommended conservative managemtn. - GI consulted. Recommended NPO, EGD on 10.14 showed Essentially normal examination. - Repeat Ct abd showed improvement of pneumatosis intestinalis. - S/P Nasojejunal tube by IR on 10-19. Started on tube feeding 07-11-09.27.2015.  - Started on soft diet. With improvement on intake now consistently increasing.Goal > 90%.  - Continue with Protonix, GI prophylaxis.   Gastric dysmotility  - started on empiric antibiotics Received 8  days of zosyn And 6 fluconazole.  - Ct abd done with results as below. Surgery consulted and recommended conservative  managemtn. - GI consulted. Recommended NPO, EGD on 10.14 showed Essentially normal examination. - Repeat Ct abd showed improvement of pneumatosis intestinalis. - S/P Nasojejunal tube by IR on 10-19. Started on tube feeding 07-11-09.27.2015.  - Started on soft diet. With improvement on intake now consistently increasing.Goal > 90%.  - Continue with Protonix, GI prophylaxis.   Gastroenteritis Supportive care with fluids, antiemetics and pain control  - C.dif negative  Acute confusional state - Resolved. Due to BZD'as - Avoid BZ's and/or narcotics  Hypoglycemia Started on D 5 half NS with improvement.  - Hypoglycemia was likely secondary to use of insulin in setting of poor by mouth intake.   Diabetes mellitus, type 2 A1c 6,.5 on metformin, had hypoglycemia with insulin in hospital  Atrial fibrillation Rate controlled with coreg;on no prophylaxis 2/2 falls  HLD (hyperlipidemia) Well controlled on zocor  History of GI bleed Noted, imp to remember    Hennie Duos, MD

## 2014-07-25 NOTE — Assessment & Plan Note (Signed)
-   started on empiric antibiotics Received 8 days of zosyn And 6 fluconazole.  - Ct abd done with results as below. Surgery consulted and recommended conservative managemtn. - GI consulted. Recommended NPO, EGD on 10.14 showed Essentially normal examination. - Repeat Ct abd showed improvement of pneumatosis intestinalis. - S/P Nasojejunal tube by IR on 10-19. Started on tube feeding 07-11-09.27.2015.  - Started on soft diet. With improvement on intake now consistently increasing.Goal > 90%.  - Continue with Protonix, GI prophylaxis.

## 2014-07-25 NOTE — Assessment & Plan Note (Signed)
A1c 6,.5 on metformin, had hypoglycemia with insulin in hospital

## 2014-07-25 NOTE — Assessment & Plan Note (Signed)
Started on D 5 half NS with improvement.  - Hypoglycemia was likely secondary to use of insulin in setting of poor by mouth intake.

## 2014-07-25 NOTE — Assessment & Plan Note (Signed)
Supportive care with fluids, antiemetics and pain control  - C.dif negative

## 2014-07-25 NOTE — Assessment & Plan Note (Signed)
Gastric dysmotility:  - started on empiric antibiotics Received 8 days of zosyn And 6 fluconazole.  - Ct abd done with results as below. Surgery consulted and recommended conservative managemtn. - GI consulted. Recommended NPO, EGD on 10.14 showed Essentially normal examination. - Repeat Ct abd showed improvement of pneumatosis intestinalis. - S/P Nasojejunal tube by IR on 10-19. Started on tube feeding 07-11-09.27.2015.  - Started on soft diet. With improvement on intake now consistently increasing.Goal > 90%.  - Continue with Protonix, GI prophylaxis.

## 2014-07-25 NOTE — Telephone Encounter (Signed)
[facility] 

## 2014-07-25 NOTE — Assessment & Plan Note (Signed)
Rate controlled with coreg;on no prophylaxis 2/2 falls

## 2014-07-28 ENCOUNTER — Non-Acute Institutional Stay (SKILLED_NURSING_FACILITY): Payer: 59 | Admitting: Internal Medicine

## 2014-07-28 DIAGNOSIS — H6122 Impacted cerumen, left ear: Secondary | ICD-10-CM | POA: Insufficient documentation

## 2014-07-28 NOTE — Assessment & Plan Note (Signed)
Admits has had this problem prior

## 2014-07-28 NOTE — Progress Notes (Signed)
MRN: [id number] Name: [name]  Sex: male Age: 80 y.o. DOB: 06/24/1928  [place] #: Helene Kelp Facility/Room:100's Level Of Care: SNF Provider: Inocencio Homes D Emergency Contacts: Extended Emergency Contact Information Primary Emergency Contact: Dick,Mable Address: [address]          [place], [place] [address] [place] [employer] Phone: [phone] Relation: Spouse Secondary Emergency Contact: Marlyne Beards [place] Mobile Phone: [phone] Relation: Daughter  Code Status: FULL  Allergies: Review of patient's allergies indicates no known allergies.  Chief Complaint  Patient presents with  . Acute Visit    HPI: Patient is 80 y.o. male who nursing has asked me to see for decreased hearing in L ear  Past Medical History  Diagnosis Date  . Dysrhythmia     atrial fibrilation  . Hypertension   . Shortness of breath   . Claudication of calf muscles 11/05/2012    right calf  . GERD (gastroesophageal reflux disease)   . TIA (transient ischemic attack) 2010  . Peripheral vascular disease   . On home oxygen therapy     "2L; w/activity" (02/24/2014)  . Neuromuscular disorder     dizziness  . Macular degeneration, bilateral     "has had shots in both eyes"  . Coronary artery disease   . COPD (chronic obstructive pulmonary disease)   . Type II diabetes mellitus   . Stroke 05/2013    "can't use left arm fully since"  . Arthritis     "legs" (02/24/2014)  . Pneumonia     "5 times back to back recently (just finished antibiiotic) " (02/24/2014)    Past Surgical History  Procedure Laterality Date  . Removal of pleural drainage catheter Left 05/03/2014    Procedure: REMOVAL OF PLEURAL DRAINAGE CATHETER;  Surgeon: Ivin Poot, MD;  Location: [place] [place];  Service: Thoracic;  Laterality: Left;  . Foot surgery Left     due to broken foot years ago  . Esophagogastroduodenoscopy N/A 11/02/2013    Procedure: ESOPHAGOGASTRODUODENOSCOPY (EGD);   Surgeon: Cleotis Nipper, MD;  Location: [facility] ENDOSCOPY;  Service: Endoscopy;  Laterality: N/A;  . Vascular surgery      stents to legs  . Cardiac catheterization    . Transurethral resection of prostate    . Femoral artery stent Left 10/2012  . Chest tube insertion Left 02/24/2014    Procedure: INSERTION PLEURAL DRAINAGE CATHETER;  Surgeon: Ivin Poot, MD;  Location: [place];  Service: Thoracic;  Laterality: Left;  . Esophagogastroduodenoscopy (egd) with propofol N/A 07/06/2014    Procedure: ESOPHAGOGASTRODUODENOSCOPY (EGD) WITH PROPOFOL;  Surgeon: Cleotis Nipper, MD;  Location: [place];  Service: Endoscopy;  Laterality: N/A;      Medication List       This list is accurate as of: 07/28/14  9:15 PM.  Always use your most recent med list.               albuterol (2.5 MG/3ML) 0.083% nebulizer solution  Commonly known as:  PROVENTIL  Take 2.5 mg by nebulization 4 (four) times daily.     allopurinol 100 MG tablet  Commonly known as:  ZYLOPRIM  Take 100 mg by mouth 2 (two) times daily.     budesonide 0.5 MG/2ML nebulizer solution  Commonly known as:  PULMICORT  Take 0.5 mg by nebulization 2 (two) times daily as needed (for wheezing/shortness of breath).     carvedilol 6.25 MG tablet  Commonly known as:  COREG  Take 6.25 mg by mouth 2 (two) times daily with a meal.     cilostazol 50 MG tablet  Commonly known as:  PLETAL  Take 50 mg by mouth 2 (two) times daily.     colchicine 0.6 MG tablet  Take 1 tablet (0.6 mg total) by mouth 2 (two) times daily.     loratadine 10 MG tablet  Commonly known as:  CLARITIN  Take 10 mg by mouth daily.     loratadine 10 MG tablet  Commonly known as:  CLARITIN  Take 10 mg by mouth daily.     magnesium oxide 400 MG tablet  Commonly known as:  MAG-OX  Take 200 mg by mouth daily.     Melatonin 3 MG Tabs  Take 3 mg by mouth at bedtime.     metFORMIN 1000 MG tablet  Commonly known as:  GLUCOPHAGE  Take 500 mg by mouth 2 (two)  times daily with a meal.     OCUVITE PRESERVISION PO  Take 1 tablet by mouth daily.     pantoprazole 40 MG tablet  Commonly known as:  PROTONIX  Take 1 tablet (40 mg total) by mouth 2 (two) times daily.     ranitidine 300 MG tablet  Commonly known as:  ZANTAC  Take 300 mg by mouth at bedtime.     simvastatin 40 MG tablet  Commonly known as:  ZOCOR  Take 40 mg by mouth every evening.     THICK-IT Powd  Generic drug:  STARCH-MALTO DEXTRIN  1 scoop by Other route 3 (three) times daily with meals. To make liquids nectar thick     tiotropium 18 MCG inhalation capsule  Commonly known as:  SPIRIVA  Place 18 mcg into inhaler and inhale daily.     traMADol 50 MG tablet  Commonly known as:  ULTRAM  Take 1 tablet (50 mg total) by mouth every 6 (six) hours as needed for moderate pain.     vitamin B-12 1000 MCG tablet  Commonly known as:  CYANOCOBALAMIN  Take 1,000 mcg by mouth daily.        No orders of the defined types were placed in this encounter.    Immunization History  Administered Date(s) Administered  . Influenza Split 06/23/2013  . Pneumococcal Polysaccharide-23 12/05/2013    History  Substance Use Topics  . Smoking status: Former Smoker -- 3.00 packs/day for 35 years    Types: Cigarettes    Quit date: 09/23/1981  . Smokeless tobacco: Never Used  . Alcohol Use: Yes     Comment: QUITS YEARS AGO    Review of Systems  DATA OBTAINED: from patient, nurse, medical record, family member GENERAL:  no fevers, fatigue, appetite changes SKIN: No itching, rash HEENT: No complaint RESPIRATORY: No cough, wheezing, SOB CARDIAC: No chest pain, palpitations, lower extremity edema  GI: No abdominal pain, No N/V/D or constipation, No heartburn or reflux  GU: No dysuria, frequency or urgency, or incontinence  MUSCULOSKELETAL: No unrelieved bone/joint pain NEUROLOGIC: No headache, dizziness  PSYCHIATRIC: No overt anxiety or sadness  There were no vitals filed for this  visit.  Physical Exam  GENERAL APPEARANCE: Alert, conversant, No acute distress  SKIN: No diaphoresis rash, or wounds HEENT: Unremarkable RESPIRATORY: Breathing is even, unlabored. Lung sounds are clear   CARDIOVASCULAR: Heart RRR no murmurs, rubs or gallops. No peripheral edema  GASTROINTESTINAL: Abdomen is soft, non-tender, not distended w/ normal bowel sounds.  GENITOURINARY: Bladder non tender, not distended  MUSCULOSKELETAL: No  abnormal joints or musculature NEUROLOGIC: Cranial nerves 2-12 grossly intact. Moves all extremities PSYCHIATRIC: Mood and affect appropriate to situation, no behavioral issues  Patient Active Problem List   Diagnosis Date Noted  . Acute confusional state 07/25/2014  . Hypoglycemia 07/25/2014  . Gastric dysmotility 07/20/2014  . Acute kidney injury 07/05/2014  . Hypernatremia 07/05/2014  . Pneumatosis intestinalis 07/03/2014  . Hypokalemia 07/02/2014  . Dehydration 07/02/2014  . Gastroenteritis 07/02/2014  . Pleural effusion on left 02/24/2014  . Protein-calorie malnutrition, severe 01/03/2014  . UTI (lower urinary tract infection) 12/31/2013  . HCAP (healthcare-associated pneumonia) 12/31/2013  . Aspiration pneumonia 12/31/2013  . Acute blood loss anemia 12/31/2013  . Pleural effusion, left 12/21/2013  . Dysphagia, unspecified(787.20) 12/21/2013  . Pneumonia 10/31/2013  . CAP (community acquired pneumonia) 10/31/2013  . TIA (transient ischemic attack) 07/06/2013  . PAD (peripheral artery disease) 07/06/2013  . HTN (hypertension) 07/06/2013  . Diabetes mellitus, type 2 07/06/2013  . HLD (hyperlipidemia) 07/06/2013  . COPD (chronic obstructive pulmonary disease) 07/06/2013  . Atrial fibrillation 07/06/2013  . History of GI bleed 07/06/2013  . Cardiomegaly 07/06/2013  . CVA (cerebral infarction) 07/06/2013    CBC    Component Value Date/Time   WBC 6.5 07/12/2014 1155   RBC 3.32* 07/12/2014 1155   RBC 2.41* 12/31/2013 2330   HGB 10.8*  07/12/2014 1155   HCT 32.8* 07/12/2014 1155   PLT PLATELETS APPEAR ADEQUATE 07/12/2014 1155   MCV 98.8 07/12/2014 1155   LYMPHSABS 1.4 07/05/2014 0421   MONOABS 1.9* 07/05/2014 0421   EOSABS 0.0 07/05/2014 0421   BASOSABS 0.0 07/05/2014 0421    CMP     Component Value Date/Time   NA 138 07/21/2014 0856   K 4.7 07/21/2014 0856   CL 102 07/21/2014 0856   CO2 25 07/21/2014 0856   GLUCOSE 112* 07/21/2014 0856   BUN 30* 07/21/2014 0856   CREATININE 0.76 07/21/2014 0856   CALCIUM 9.0 07/21/2014 0856   PROT 6.6 07/05/2014 0421   ALBUMIN 2.6* 07/05/2014 0421   AST 23 07/05/2014 0421   ALT 16 07/05/2014 0421   ALKPHOS 74 07/05/2014 0421   BILITOT 0.6 07/05/2014 0421   GFRNONAA 81* 07/21/2014 0856   GFRAA >90 07/21/2014 0856    Assessment and Plan  No problem-specific assessment & plan notes found for this encounter.   Hennie Duos, MD

## 2014-08-01 ENCOUNTER — Encounter: Payer: Self-pay | Admitting: Internal Medicine

## 2014-08-02 ENCOUNTER — Other Ambulatory Visit (HOSPITAL_COMMUNITY): Payer: Self-pay | Admitting: Internal Medicine

## 2014-08-02 DIAGNOSIS — R1310 Dysphagia, unspecified: Secondary | ICD-10-CM

## 2014-08-05 ENCOUNTER — Ambulatory Visit (HOSPITAL_COMMUNITY)
Admission: RE | Admit: 2014-08-05 | Discharge: 2014-08-05 | Disposition: A | Discharge status: Home / Self Care | Payer: Medicare Other | Source: Ambulatory Visit | Attending: Internal Medicine | Admitting: Internal Medicine

## 2014-08-05 DIAGNOSIS — R1310 Dysphagia, unspecified: Secondary | ICD-10-CM | POA: Insufficient documentation

## 2014-08-05 DIAGNOSIS — Z8701 Personal history of pneumonia (recurrent): Secondary | ICD-10-CM | POA: Insufficient documentation

## 2014-08-05 DIAGNOSIS — J449 Chronic obstructive pulmonary disease, unspecified: Secondary | ICD-10-CM | POA: Diagnosis not present

## 2014-08-05 DIAGNOSIS — E46 Unspecified protein-calorie malnutrition: Secondary | ICD-10-CM | POA: Diagnosis not present

## 2014-08-05 DIAGNOSIS — I69391 Dysphagia following cerebral infarction: Secondary | ICD-10-CM | POA: Insufficient documentation

## 2014-08-05 DIAGNOSIS — I739 Peripheral vascular disease, unspecified: Secondary | ICD-10-CM | POA: Diagnosis not present

## 2014-08-05 DIAGNOSIS — K219 Gastro-esophageal reflux disease without esophagitis: Secondary | ICD-10-CM | POA: Insufficient documentation

## 2014-08-05 DIAGNOSIS — I4891 Unspecified atrial fibrillation: Secondary | ICD-10-CM | POA: Diagnosis not present

## 2014-08-05 NOTE — Procedures (Signed)
Objective Swallowing Evaluation: Modified Barium Swallowing Study  Patient Details  Name: [name] MRN: [id number] Date of Birth: Oct 09, 1927  Today's Date: 08/05/2014 Time: 1110-1143 SLP Time Calculation (min) (ACUTE ONLY): 33 min  Past Medical History:  Past Medical History  Diagnosis Date  . Dysrhythmia     atrial fibrilation  . Hypertension   . Shortness of breath   . Claudication of calf muscles 11/05/2012    right calf  . GERD (gastroesophageal reflux disease)   . TIA (transient ischemic attack) 2010  . Peripheral vascular disease   . On home oxygen therapy     "2L; w/activity" (02/24/2014)  . Neuromuscular disorder     dizziness  . Macular degeneration, bilateral     "has had shots in both eyes"  . Coronary artery disease   . COPD (chronic obstructive pulmonary disease)   . Type II diabetes mellitus   . Stroke 05/2013    "can't use left arm fully since"  . Arthritis     "legs" (02/24/2014)  . Pneumonia     "5 times back to back recently (just finished antibiiotic) " (02/24/2014)   Past Surgical History:  Past Surgical History  Procedure Laterality Date  . Removal of pleural drainage catheter Left 05/03/2014    Procedure: REMOVAL OF PLEURAL DRAINAGE CATHETER;  Surgeon: Ivin Poot, MD;  Location: [place];  Service: Thoracic;  Laterality: Left;  . Foot surgery Left     due to broken foot years ago  . Esophagogastroduodenoscopy N/A 11/02/2013    Procedure: ESOPHAGOGASTRODUODENOSCOPY (EGD);  Surgeon: Cleotis Nipper, MD;  Location: [facility] ENDOSCOPY;  Service: Endoscopy;  Laterality: N/A;  . Vascular surgery      stents to legs  . Cardiac catheterization    . Transurethral resection of prostate    . Femoral artery stent Left 10/2012  . Chest tube insertion Left 02/24/2014    Procedure: INSERTION PLEURAL DRAINAGE CATHETER;  Surgeon: Ivin Poot, MD;  Location: [place];  Service: Thoracic;  Laterality: Left;  . Esophagogastroduodenoscopy (egd) with propofol N/A  07/06/2014    Procedure: ESOPHAGOGASTRODUODENOSCOPY (EGD) WITH PROPOFOL;  Surgeon: Cleotis Nipper, MD;  Location: [place];  Service: Endoscopy;  Laterality: N/A;   HPI:  80 y/o male seen for OP MBS, hoping for upgrade of diet from nectar thick liquids given evidence of clinical improvement after rehab stay. Pt initially started on thickened liquids due to recurrent PNAs and OP MBS on 12/20/13 recommending regular texture diet and nectar thick liquids.  Patient with h/o CVA in September of 2014      Assessment / Plan / Recommendation Clinical Impression  Dysphagia Diagnosis: Mild oral phase dysphagia;Moderate pharyngeal phase dysphagia;Mild pharyngeal phase dysphagia Clinical impression: Pt demonstrates a mild generalized weakness of oral and oropharyngeal structures, likely improved since last MBS. Lingual cupping and propulsion of bolus is weak with immediate spillage and delay to pyriform sinuses. With small sips, pt did not penetrate or aspiration, though large consecutive sips with cup and straw result in trace silent penetration. There are mild residuals due to weakness. Chin tuck worsened function but cues to orally hold bolus, then swallow were effective in improving timing of swallow. Recommend  pt consume Dys 3 (mechanical soft) diet with thin liquids, only when fully upright and maximally alert, Discussed use of nectar thick liquids with any lethargy or medical decline with wife as pt will be at high risk of aspiration if there is any change in function. Defer f/u to  SLP at SNF.     Treatment Recommendation  Defer treatment plan to SLP at (Comment)    Diet Recommendation Dysphagia 3 (Mechanical Soft);Thin liquid   Liquid Administration via: Cup;No straw Medication Administration: Whole meds with puree Supervision: Patient able to self feed;Full supervision/cueing for compensatory strategies;Trained caregiver to feed patient Compensations: Slow rate;Small sips/bites;Multiple dry  swallows after each bite/sip Postural Changes and/or Swallow Maneuvers: Seated upright 90 degrees;Upright 30-60 min after meal;Out of bed for meals    Other  Recommendations Oral Care Recommendations: Oral care BID   Follow Up Recommendations  Skilled Nursing facility    Frequency and Duration        Pertinent Vitals/Pain NA    SLP Swallow Goals     General HPI: 80 y/o male seen for OP MBS, hoping for upgrade of diet from nectar thick liquids given evidence of clinical improvement after rehab stay. Pt initially started on thickened liquids due to recurrent PNAs and OP MBS on 12/20/13 recommending regular texture diet and nectar thick liquids.  Patient with h/o CVA in September of 2014  Type of Study: Modified Barium Swallowing Study Reason for Referral: Objectively evaluate swallowing function Previous Swallow Assessment: see HPI Diet Prior to this Study: Dysphagia 3 (soft);Nectar-thick liquids Temperature Spikes Noted: N/A Respiratory Status: Room air History of Recent Intubation: No Behavior/Cognition: Alert;Cooperative;Pleasant mood;Requires cueing Oral Motor / Sensory Function: Within functional limits Self-Feeding Abilities: Able to feed self;Needs assist Patient Positioning: Upright in chair Baseline Vocal Quality: Clear Volitional Cough: Strong Volitional Swallow: Able to elicit    Reason for Referral Objectively evaluate swallowing function   Oral Phase Oral Preparation/Oral Phase Oral Phase: Impaired Oral - Thin Oral - Thin Cup: Weak lingual manipulation;Reduced posterior propulsion Oral - Thin Straw: Weak lingual manipulation;Reduced posterior propulsion Oral - Solids Oral - Puree: Weak lingual manipulation;Reduced posterior propulsion Oral - Regular: Weak lingual manipulation;Reduced posterior propulsion Oral - Pill: Weak lingual manipulation;Reduced posterior propulsion   Pharyngeal Phase Pharyngeal Phase Pharyngeal Phase: Impaired Pharyngeal -  Thin Pharyngeal - Thin Cup: Delayed swallow initiation;Reduced epiglottic inversion;Reduced anterior laryngeal mobility;Reduced laryngeal elevation;Reduced airway/laryngeal closure;Reduced tongue base retraction;Penetration/Aspiration after swallow;Pharyngeal residue - valleculae;Pharyngeal residue - pyriform sinuses Penetration/Aspiration details (thin cup): Material enters airway, remains ABOVE vocal cords then ejected out;Material does not enter airway Pharyngeal - Thin Straw: Delayed swallow initiation;Reduced epiglottic inversion;Reduced anterior laryngeal mobility;Reduced laryngeal elevation;Reduced airway/laryngeal closure;Reduced tongue base retraction;Penetration/Aspiration after swallow;Pharyngeal residue - valleculae;Pharyngeal residue - pyriform sinuses Penetration/Aspiration details (thin straw): Material enters airway, remains ABOVE vocal cords and not ejected out;Material does not enter airway Pharyngeal - Solids Pharyngeal - Puree: Delayed swallow initiation;Reduced epiglottic inversion;Reduced anterior laryngeal mobility;Reduced laryngeal elevation;Reduced airway/laryngeal closure;Reduced tongue base retraction;Pharyngeal residue - valleculae;Pharyngeal residue - pyriform sinuses Penetration/Aspiration details (puree): Material does not enter airway Pharyngeal - Regular: Delayed swallow initiation;Reduced epiglottic inversion;Reduced anterior laryngeal mobility;Reduced laryngeal elevation;Reduced airway/laryngeal closure;Reduced tongue base retraction;Penetration/Aspiration after swallow;Pharyngeal residue - valleculae;Pharyngeal residue - pyriform sinuses Penetration/Aspiration details (regular): Material does not enter airway Pharyngeal - Pill: Delayed swallow initiation;Reduced epiglottic inversion;Reduced anterior laryngeal mobility;Reduced laryngeal elevation;Reduced airway/laryngeal closure;Reduced tongue base retraction;Pharyngeal residue - valleculae;Pharyngeal residue - pyriform  sinuses Penetration/Aspiration details (pill): Material does not enter airway  Cervical Esophageal Phase    GO    Cervical Esophageal Phase Cervical Esophageal Phase: [facility]    Functional Assessment Tool Used: clinical judgement Functional Limitations: Swallowing Swallow Current Status (O9629): At least 40 percent but less than 60 percent impaired, limited or restricted Swallow Goal Status [phone]): At least 40 percent but less  than 60 percent impaired, limited or restricted Swallow Discharge Status [phone]): At least 40 percent but less than 60 percent impaired, limited or restricted   [facility], [place] CCC-SLP [phone]  Lynann Beaver 08/05/2014, 12:34 PM

## 2014-08-08 ENCOUNTER — Non-Acute Institutional Stay (SKILLED_NURSING_FACILITY): Payer: 59 | Admitting: Internal Medicine

## 2014-08-08 ENCOUNTER — Encounter: Payer: Self-pay | Admitting: Internal Medicine

## 2014-08-08 DIAGNOSIS — I482 Chronic atrial fibrillation, unspecified: Secondary | ICD-10-CM

## 2014-08-08 DIAGNOSIS — K6389 Other specified diseases of intestine: Secondary | ICD-10-CM

## 2014-08-08 DIAGNOSIS — E1151 Type 2 diabetes mellitus with diabetic peripheral angiopathy without gangrene: Secondary | ICD-10-CM

## 2014-08-08 DIAGNOSIS — K3189 Other diseases of stomach and duodenum: Secondary | ICD-10-CM

## 2014-08-08 DIAGNOSIS — E43 Unspecified severe protein-calorie malnutrition: Secondary | ICD-10-CM

## 2014-08-08 DIAGNOSIS — J439 Emphysema, unspecified: Secondary | ICD-10-CM

## 2014-08-08 DIAGNOSIS — I739 Peripheral vascular disease, unspecified: Secondary | ICD-10-CM

## 2014-08-08 DIAGNOSIS — I10 Essential (primary) hypertension: Secondary | ICD-10-CM

## 2014-08-08 DIAGNOSIS — E785 Hyperlipidemia, unspecified: Secondary | ICD-10-CM

## 2014-08-08 DIAGNOSIS — H109 Unspecified conjunctivitis: Secondary | ICD-10-CM

## 2014-08-08 NOTE — Progress Notes (Signed)
MRN: [id number] Name: [name]  Sex: male Age: 80 y.o. DOB: 11-01-1927  [place] #: Helene Kelp Facility/Room: 224 Level Of Care: SNF Provider: Inocencio Homes D Emergency Contacts: Extended Emergency Contact Information Primary Emergency Contact: Dick,Mable Address: [address]          [place], [place] [address] [place] [employer] Phone: [phone] Relation: Spouse Secondary Emergency Contact: Marlyne Beards [place] Mobile Phone: [phone] Relation: Daughter  Code Status: FULL  Allergies: Review of patient's allergies indicates no known allergies.  Chief Complaint  Patient presents with  . Discharge Note    HPI: Patient is 80 y.o. male who was admitted to SNF for generalized weakness 2/2 n/v/d from interstinal pneumatosis who is now ready to be d/c to home.  Past Medical History  Diagnosis Date  . Dysrhythmia     atrial fibrilation  . Hypertension   . Shortness of breath   . Claudication of calf muscles 11/05/2012    right calf  . GERD (gastroesophageal reflux disease)   . TIA (transient ischemic attack) 2010  . Peripheral vascular disease   . On home oxygen therapy     "2L; w/activity" (02/24/2014)  . Neuromuscular disorder     dizziness  . Macular degeneration, bilateral     "has had shots in both eyes"  . Coronary artery disease   . COPD (chronic obstructive pulmonary disease)   . Type II diabetes mellitus   . Stroke 05/2013    "can't use left arm fully since"  . Arthritis     "legs" (02/24/2014)  . Pneumonia     "5 times back to back recently (just finished antibiiotic) " (02/24/2014)    Past Surgical History  Procedure Laterality Date  . Removal of pleural drainage catheter Left 05/03/2014    Procedure: REMOVAL OF PLEURAL DRAINAGE CATHETER;  Surgeon: Ivin Poot, MD;  Location: [place];  Service: Thoracic;  Laterality: Left;  . Foot surgery Left     due to broken foot years ago  . Esophagogastroduodenoscopy  N/A 11/02/2013    Procedure: ESOPHAGOGASTRODUODENOSCOPY (EGD);  Surgeon: Cleotis Nipper, MD;  Location: [facility] ENDOSCOPY;  Service: Endoscopy;  Laterality: N/A;  . Vascular surgery      stents to legs  . Cardiac catheterization    . Transurethral resection of prostate    . Femoral artery stent Left 10/2012  . Chest tube insertion Left 02/24/2014    Procedure: INSERTION PLEURAL DRAINAGE CATHETER;  Surgeon: Ivin Poot, MD;  Location: [place];  Service: Thoracic;  Laterality: Left;  . Esophagogastroduodenoscopy (egd) with propofol N/A 07/06/2014    Procedure: ESOPHAGOGASTRODUODENOSCOPY (EGD) WITH PROPOFOL;  Surgeon: Cleotis Nipper, MD;  Location: [place];  Service: Endoscopy;  Laterality: N/A;      Medication List       This list is accurate as of: 08/08/14  3:21 PM.  Always use your most recent med list.               albuterol (2.5 MG/3ML) 0.083% nebulizer solution  Commonly known as:  PROVENTIL  Take 2.5 mg by nebulization 4 (four) times daily.     allopurinol 100 MG tablet  Commonly known as:  ZYLOPRIM  Take 100 mg by mouth 2 (two) times daily.     budesonide 0.5 MG/2ML nebulizer solution  Commonly known as:  PULMICORT  Take 0.5 mg by nebulization 2 (two) times daily as needed (for wheezing/shortness of breath).     carvedilol  6.25 MG tablet  Commonly known as:  COREG  Take 6.25 mg by mouth 2 (two) times daily with a meal.     cilostazol 50 MG tablet  Commonly known as:  PLETAL  Take 50 mg by mouth 2 (two) times daily.     colchicine 0.6 MG tablet  Take 1 tablet (0.6 mg total) by mouth 2 (two) times daily.     loratadine 10 MG tablet  Commonly known as:  CLARITIN  Take 10 mg by mouth daily.     loratadine 10 MG tablet  Commonly known as:  CLARITIN  Take 10 mg by mouth daily.     magnesium oxide 400 MG tablet  Commonly known as:  MAG-OX  Take 200 mg by mouth daily.     Melatonin 3 MG Tabs  Take 3 mg by mouth at bedtime.     metFORMIN 1000 MG tablet   Commonly known as:  GLUCOPHAGE  Take 500 mg by mouth 2 (two) times daily with a meal.     OCUVITE PRESERVISION PO  Take 1 tablet by mouth daily.     pantoprazole 40 MG tablet  Commonly known as:  PROTONIX  Take 1 tablet (40 mg total) by mouth 2 (two) times daily.     ranitidine 300 MG tablet  Commonly known as:  ZANTAC  Take 300 mg by mouth at bedtime.     simvastatin 40 MG tablet  Commonly known as:  ZOCOR  Take 40 mg by mouth every evening.     sulfacetamide 10 % ophthalmic ointment  Commonly known as:  BLEPH-10  Place 2 drops into the left eye 3 (three) times daily.     THICK-IT Powd  Generic drug:  STARCH-MALTO DEXTRIN  1 scoop by Other route 3 (three) times daily with meals. To make liquids nectar thick     tiotropium 18 MCG inhalation capsule  Commonly known as:  SPIRIVA  Place 18 mcg into inhaler and inhale daily.     traMADol 50 MG tablet  Commonly known as:  ULTRAM  Take 1 tablet (50 mg total) by mouth every 6 (six) hours as needed for moderate pain.     vitamin B-12 1000 MCG tablet  Commonly known as:  CYANOCOBALAMIN  Take 1,000 mcg by mouth daily.        Meds ordered this encounter  Medications  . sulfacetamide (BLEPH-10) 10 % ophthalmic ointment    Sig: Place 2 drops into the left eye 3 (three) times daily.    Immunization History  Administered Date(s) Administered  . Influenza Split 06/23/2013  . Pneumococcal Polysaccharide-23 12/05/2013    History  Substance Use Topics  . Smoking status: Former Smoker -- 3.00 packs/day for 35 years    Types: Cigarettes    Quit date: 09/23/1981  . Smokeless tobacco: Never Used  . Alcohol Use: Yes     Comment: QUITS YEARS AGO    Filed Vitals:   08/08/14 1447  BP: 106/60    Physical Exam  GENERAL APPEARANCE: Alert, nonconversant. No acute distress.  HEENT: slt erythema and swelling L conjunctiva, improved RESPIRATORY: Breathing is even, unlabored. Lung sounds are clear   CARDIOVASCULAR: Heart RRR  no murmurs, rubs or gallops. No peripheral edema.  GASTROINTESTINAL: Abdomen is soft, non-tender, not distended w/ normal bowel sounds.  NEUROLOGIC: Cranial nerves 2-12 grossly intact  Patient Active Problem List   Diagnosis Date Noted  . Excessive cerumen in left ear canal 07/28/2014  . Acute confusional state 07/25/2014  .  Hypoglycemia 07/25/2014  . Gastric dysmotility 07/20/2014  . Acute kidney injury 07/05/2014  . Hypernatremia 07/05/2014  . Pneumatosis intestinalis 07/03/2014  . Hypokalemia 07/02/2014  . Dehydration 07/02/2014  . Gastroenteritis 07/02/2014  . Pleural effusion on left 02/24/2014  . Protein-calorie malnutrition, severe 01/03/2014  . UTI (lower urinary tract infection) 12/31/2013  . HCAP (healthcare-associated pneumonia) 12/31/2013  . Aspiration pneumonia 12/31/2013  . Acute blood loss anemia 12/31/2013  . Pleural effusion, left 12/21/2013  . Dysphagia, unspecified(787.20) 12/21/2013  . Pneumonia 10/31/2013  . CAP (community acquired pneumonia) 10/31/2013  . TIA (transient ischemic attack) 07/06/2013  . PAD (peripheral artery disease) 07/06/2013  . HTN (hypertension) 07/06/2013  . Diabetes mellitus, type 2 07/06/2013  . HLD (hyperlipidemia) 07/06/2013  . COPD (chronic obstructive pulmonary disease) 07/06/2013  . Atrial fibrillation 07/06/2013  . History of GI bleed 07/06/2013  . Cardiomegaly 07/06/2013  . CVA (cerebral infarction) 07/06/2013    CBC    Component Value Date/Time   WBC 6.5 07/12/2014 1155   RBC 3.32* 07/12/2014 1155   RBC 2.41* 12/31/2013 2330   HGB 10.8* 07/12/2014 1155   HCT 32.8* 07/12/2014 1155   PLT PLATELETS APPEAR ADEQUATE 07/12/2014 1155   MCV 98.8 07/12/2014 1155   LYMPHSABS 1.4 07/05/2014 0421   MONOABS 1.9* 07/05/2014 0421   EOSABS 0.0 07/05/2014 0421   BASOSABS 0.0 07/05/2014 0421    CMP     Component Value Date/Time   NA 138 07/21/2014 0856   K 4.7 07/21/2014 0856   CL 102 07/21/2014 0856   CO2 25 07/21/2014  0856   GLUCOSE 112* 07/21/2014 0856   BUN 30* 07/21/2014 0856   CREATININE 0.76 07/21/2014 0856   CALCIUM 9.0 07/21/2014 0856   PROT 6.6 07/05/2014 0421   ALBUMIN 2.6* 07/05/2014 0421   AST 23 07/05/2014 0421   ALT 16 07/05/2014 0421   ALKPHOS 74 07/05/2014 0421   BILITOT 0.6 07/05/2014 0421   GFRNONAA 81* 07/21/2014 0856   GFRAA >90 07/21/2014 0856    Assessment and Plan  Pt is stable for d/c to home with supervision and HH/OT/PT.  Hennie Duos, MD

## 2014-09-01 ENCOUNTER — Encounter (HOSPITAL_COMMUNITY): Payer: Self-pay | Admitting: Cardiology

## 2014-09-02 ENCOUNTER — Other Ambulatory Visit: Payer: Self-pay | Admitting: Internal Medicine

## 2014-09-03 ENCOUNTER — Other Ambulatory Visit: Payer: Self-pay | Admitting: Internal Medicine

## 2014-10-23 ENCOUNTER — Other Ambulatory Visit: Payer: Self-pay | Admitting: Internal Medicine

## 2014-10-25 DIAGNOSIS — J209 Acute bronchitis, unspecified: Secondary | ICD-10-CM | POA: Diagnosis not present

## 2014-10-26 DIAGNOSIS — J449 Chronic obstructive pulmonary disease, unspecified: Secondary | ICD-10-CM | POA: Diagnosis not present

## 2014-10-28 DIAGNOSIS — Z125 Encounter for screening for malignant neoplasm of prostate: Secondary | ICD-10-CM | POA: Diagnosis not present

## 2014-10-28 DIAGNOSIS — M109 Gout, unspecified: Secondary | ICD-10-CM | POA: Diagnosis not present

## 2014-10-28 DIAGNOSIS — I10 Essential (primary) hypertension: Secondary | ICD-10-CM | POA: Diagnosis not present

## 2014-10-28 DIAGNOSIS — I6789 Other cerebrovascular disease: Secondary | ICD-10-CM | POA: Diagnosis not present

## 2014-10-28 DIAGNOSIS — E118 Type 2 diabetes mellitus with unspecified complications: Secondary | ICD-10-CM | POA: Diagnosis not present

## 2014-10-31 DIAGNOSIS — E118 Type 2 diabetes mellitus with unspecified complications: Secondary | ICD-10-CM | POA: Diagnosis not present

## 2014-10-31 DIAGNOSIS — R079 Chest pain, unspecified: Secondary | ICD-10-CM | POA: Diagnosis not present

## 2014-10-31 DIAGNOSIS — I10 Essential (primary) hypertension: Secondary | ICD-10-CM | POA: Diagnosis not present

## 2014-10-31 DIAGNOSIS — Z125 Encounter for screening for malignant neoplasm of prostate: Secondary | ICD-10-CM | POA: Diagnosis not present

## 2014-10-31 DIAGNOSIS — R05 Cough: Secondary | ICD-10-CM | POA: Diagnosis not present

## 2014-11-03 DIAGNOSIS — Z7901 Long term (current) use of anticoagulants: Secondary | ICD-10-CM | POA: Diagnosis not present

## 2014-11-03 DIAGNOSIS — M6281 Muscle weakness (generalized): Secondary | ICD-10-CM | POA: Diagnosis not present

## 2014-11-03 DIAGNOSIS — F329 Major depressive disorder, single episode, unspecified: Secondary | ICD-10-CM | POA: Diagnosis not present

## 2014-11-03 DIAGNOSIS — E119 Type 2 diabetes mellitus without complications: Secondary | ICD-10-CM | POA: Diagnosis not present

## 2014-11-03 DIAGNOSIS — R269 Unspecified abnormalities of gait and mobility: Secondary | ICD-10-CM | POA: Diagnosis not present

## 2014-11-03 DIAGNOSIS — Z993 Dependence on wheelchair: Secondary | ICD-10-CM | POA: Diagnosis not present

## 2014-11-08 DIAGNOSIS — R269 Unspecified abnormalities of gait and mobility: Secondary | ICD-10-CM | POA: Diagnosis not present

## 2014-11-08 DIAGNOSIS — F329 Major depressive disorder, single episode, unspecified: Secondary | ICD-10-CM | POA: Diagnosis not present

## 2014-11-08 DIAGNOSIS — Z7901 Long term (current) use of anticoagulants: Secondary | ICD-10-CM | POA: Diagnosis not present

## 2014-11-08 DIAGNOSIS — M6281 Muscle weakness (generalized): Secondary | ICD-10-CM | POA: Diagnosis not present

## 2014-11-08 DIAGNOSIS — E119 Type 2 diabetes mellitus without complications: Secondary | ICD-10-CM | POA: Diagnosis not present

## 2014-11-08 DIAGNOSIS — Z993 Dependence on wheelchair: Secondary | ICD-10-CM | POA: Diagnosis not present

## 2014-11-11 DIAGNOSIS — Z7901 Long term (current) use of anticoagulants: Secondary | ICD-10-CM | POA: Diagnosis not present

## 2014-11-11 DIAGNOSIS — F329 Major depressive disorder, single episode, unspecified: Secondary | ICD-10-CM | POA: Diagnosis not present

## 2014-11-11 DIAGNOSIS — M6281 Muscle weakness (generalized): Secondary | ICD-10-CM | POA: Diagnosis not present

## 2014-11-11 DIAGNOSIS — Z993 Dependence on wheelchair: Secondary | ICD-10-CM | POA: Diagnosis not present

## 2014-11-11 DIAGNOSIS — R269 Unspecified abnormalities of gait and mobility: Secondary | ICD-10-CM | POA: Diagnosis not present

## 2014-11-11 DIAGNOSIS — E119 Type 2 diabetes mellitus without complications: Secondary | ICD-10-CM | POA: Diagnosis not present

## 2014-11-14 DIAGNOSIS — E119 Type 2 diabetes mellitus without complications: Secondary | ICD-10-CM | POA: Diagnosis not present

## 2014-11-14 DIAGNOSIS — Z993 Dependence on wheelchair: Secondary | ICD-10-CM | POA: Diagnosis not present

## 2014-11-14 DIAGNOSIS — Z7901 Long term (current) use of anticoagulants: Secondary | ICD-10-CM | POA: Diagnosis not present

## 2014-11-14 DIAGNOSIS — M6281 Muscle weakness (generalized): Secondary | ICD-10-CM | POA: Diagnosis not present

## 2014-11-14 DIAGNOSIS — R269 Unspecified abnormalities of gait and mobility: Secondary | ICD-10-CM | POA: Diagnosis not present

## 2014-11-14 DIAGNOSIS — F329 Major depressive disorder, single episode, unspecified: Secondary | ICD-10-CM | POA: Diagnosis not present

## 2014-11-15 DIAGNOSIS — M47816 Spondylosis without myelopathy or radiculopathy, lumbar region: Secondary | ICD-10-CM | POA: Diagnosis not present

## 2014-11-15 DIAGNOSIS — M79662 Pain in left lower leg: Secondary | ICD-10-CM | POA: Diagnosis not present

## 2014-11-15 DIAGNOSIS — I4891 Unspecified atrial fibrillation: Secondary | ICD-10-CM | POA: Diagnosis not present

## 2014-11-15 DIAGNOSIS — D510 Vitamin B12 deficiency anemia due to intrinsic factor deficiency: Secondary | ICD-10-CM | POA: Diagnosis not present

## 2014-11-15 DIAGNOSIS — M25552 Pain in left hip: Secondary | ICD-10-CM | POA: Diagnosis not present

## 2014-11-15 DIAGNOSIS — M4316 Spondylolisthesis, lumbar region: Secondary | ICD-10-CM | POA: Diagnosis not present

## 2014-11-15 DIAGNOSIS — R972 Elevated prostate specific antigen [PSA]: Secondary | ICD-10-CM | POA: Diagnosis not present

## 2014-11-15 DIAGNOSIS — M5432 Sciatica, left side: Secondary | ICD-10-CM | POA: Diagnosis not present

## 2014-11-17 DIAGNOSIS — E119 Type 2 diabetes mellitus without complications: Secondary | ICD-10-CM | POA: Diagnosis not present

## 2014-11-17 DIAGNOSIS — Z7901 Long term (current) use of anticoagulants: Secondary | ICD-10-CM | POA: Diagnosis not present

## 2014-11-17 DIAGNOSIS — F329 Major depressive disorder, single episode, unspecified: Secondary | ICD-10-CM | POA: Diagnosis not present

## 2014-11-17 DIAGNOSIS — R269 Unspecified abnormalities of gait and mobility: Secondary | ICD-10-CM | POA: Diagnosis not present

## 2014-11-17 DIAGNOSIS — Z993 Dependence on wheelchair: Secondary | ICD-10-CM | POA: Diagnosis not present

## 2014-11-17 DIAGNOSIS — M6281 Muscle weakness (generalized): Secondary | ICD-10-CM | POA: Diagnosis not present

## 2014-11-21 DIAGNOSIS — M6281 Muscle weakness (generalized): Secondary | ICD-10-CM | POA: Diagnosis not present

## 2014-11-21 DIAGNOSIS — Z7901 Long term (current) use of anticoagulants: Secondary | ICD-10-CM | POA: Diagnosis not present

## 2014-11-21 DIAGNOSIS — E119 Type 2 diabetes mellitus without complications: Secondary | ICD-10-CM | POA: Diagnosis not present

## 2014-11-21 DIAGNOSIS — F329 Major depressive disorder, single episode, unspecified: Secondary | ICD-10-CM | POA: Diagnosis not present

## 2014-11-21 DIAGNOSIS — Z993 Dependence on wheelchair: Secondary | ICD-10-CM | POA: Diagnosis not present

## 2014-11-21 DIAGNOSIS — R269 Unspecified abnormalities of gait and mobility: Secondary | ICD-10-CM | POA: Diagnosis not present

## 2014-11-22 DIAGNOSIS — J984 Other disorders of lung: Secondary | ICD-10-CM | POA: Diagnosis not present

## 2014-11-22 DIAGNOSIS — J189 Pneumonia, unspecified organism: Secondary | ICD-10-CM | POA: Diagnosis not present

## 2014-11-22 DIAGNOSIS — R911 Solitary pulmonary nodule: Secondary | ICD-10-CM | POA: Diagnosis not present

## 2014-11-24 DIAGNOSIS — F329 Major depressive disorder, single episode, unspecified: Secondary | ICD-10-CM | POA: Diagnosis not present

## 2014-11-24 DIAGNOSIS — E119 Type 2 diabetes mellitus without complications: Secondary | ICD-10-CM | POA: Diagnosis not present

## 2014-11-24 DIAGNOSIS — M6281 Muscle weakness (generalized): Secondary | ICD-10-CM | POA: Diagnosis not present

## 2014-11-24 DIAGNOSIS — R269 Unspecified abnormalities of gait and mobility: Secondary | ICD-10-CM | POA: Diagnosis not present

## 2014-11-24 DIAGNOSIS — J449 Chronic obstructive pulmonary disease, unspecified: Secondary | ICD-10-CM | POA: Diagnosis not present

## 2014-11-24 DIAGNOSIS — Z7901 Long term (current) use of anticoagulants: Secondary | ICD-10-CM | POA: Diagnosis not present

## 2014-11-24 DIAGNOSIS — Z993 Dependence on wheelchair: Secondary | ICD-10-CM | POA: Diagnosis not present

## 2014-11-26 DIAGNOSIS — I6789 Other cerebrovascular disease: Secondary | ICD-10-CM | POA: Diagnosis not present

## 2014-11-28 DIAGNOSIS — R269 Unspecified abnormalities of gait and mobility: Secondary | ICD-10-CM | POA: Diagnosis not present

## 2014-11-28 DIAGNOSIS — Z7901 Long term (current) use of anticoagulants: Secondary | ICD-10-CM | POA: Diagnosis not present

## 2014-11-28 DIAGNOSIS — F329 Major depressive disorder, single episode, unspecified: Secondary | ICD-10-CM | POA: Diagnosis not present

## 2014-11-28 DIAGNOSIS — E119 Type 2 diabetes mellitus without complications: Secondary | ICD-10-CM | POA: Diagnosis not present

## 2014-11-28 DIAGNOSIS — M6281 Muscle weakness (generalized): Secondary | ICD-10-CM | POA: Diagnosis not present

## 2014-11-28 DIAGNOSIS — Z993 Dependence on wheelchair: Secondary | ICD-10-CM | POA: Diagnosis not present

## 2014-12-01 DIAGNOSIS — F329 Major depressive disorder, single episode, unspecified: Secondary | ICD-10-CM | POA: Diagnosis not present

## 2014-12-01 DIAGNOSIS — M6281 Muscle weakness (generalized): Secondary | ICD-10-CM | POA: Diagnosis not present

## 2014-12-01 DIAGNOSIS — Z993 Dependence on wheelchair: Secondary | ICD-10-CM | POA: Diagnosis not present

## 2014-12-01 DIAGNOSIS — R269 Unspecified abnormalities of gait and mobility: Secondary | ICD-10-CM | POA: Diagnosis not present

## 2014-12-01 DIAGNOSIS — E119 Type 2 diabetes mellitus without complications: Secondary | ICD-10-CM | POA: Diagnosis not present

## 2014-12-01 DIAGNOSIS — Z7901 Long term (current) use of anticoagulants: Secondary | ICD-10-CM | POA: Diagnosis not present

## 2014-12-06 DIAGNOSIS — R269 Unspecified abnormalities of gait and mobility: Secondary | ICD-10-CM | POA: Diagnosis not present

## 2014-12-06 DIAGNOSIS — Z7901 Long term (current) use of anticoagulants: Secondary | ICD-10-CM | POA: Diagnosis not present

## 2014-12-06 DIAGNOSIS — M6281 Muscle weakness (generalized): Secondary | ICD-10-CM | POA: Diagnosis not present

## 2014-12-06 DIAGNOSIS — Z993 Dependence on wheelchair: Secondary | ICD-10-CM | POA: Diagnosis not present

## 2014-12-06 DIAGNOSIS — F329 Major depressive disorder, single episode, unspecified: Secondary | ICD-10-CM | POA: Diagnosis not present

## 2014-12-06 DIAGNOSIS — E119 Type 2 diabetes mellitus without complications: Secondary | ICD-10-CM | POA: Diagnosis not present

## 2014-12-08 DIAGNOSIS — F329 Major depressive disorder, single episode, unspecified: Secondary | ICD-10-CM | POA: Diagnosis not present

## 2014-12-08 DIAGNOSIS — M6281 Muscle weakness (generalized): Secondary | ICD-10-CM | POA: Diagnosis not present

## 2014-12-08 DIAGNOSIS — Z7901 Long term (current) use of anticoagulants: Secondary | ICD-10-CM | POA: Diagnosis not present

## 2014-12-08 DIAGNOSIS — R269 Unspecified abnormalities of gait and mobility: Secondary | ICD-10-CM | POA: Diagnosis not present

## 2014-12-08 DIAGNOSIS — Z993 Dependence on wheelchair: Secondary | ICD-10-CM | POA: Diagnosis not present

## 2014-12-08 DIAGNOSIS — E119 Type 2 diabetes mellitus without complications: Secondary | ICD-10-CM | POA: Diagnosis not present

## 2014-12-19 DIAGNOSIS — D649 Anemia, unspecified: Secondary | ICD-10-CM | POA: Diagnosis not present

## 2014-12-22 DIAGNOSIS — I10 Essential (primary) hypertension: Secondary | ICD-10-CM | POA: Diagnosis not present

## 2014-12-22 DIAGNOSIS — E780 Pure hypercholesterolemia: Secondary | ICD-10-CM | POA: Diagnosis not present

## 2014-12-22 DIAGNOSIS — I480 Paroxysmal atrial fibrillation: Secondary | ICD-10-CM | POA: Diagnosis not present

## 2014-12-22 DIAGNOSIS — E119 Type 2 diabetes mellitus without complications: Secondary | ICD-10-CM | POA: Diagnosis not present

## 2014-12-25 DIAGNOSIS — J449 Chronic obstructive pulmonary disease, unspecified: Secondary | ICD-10-CM | POA: Diagnosis not present

## 2014-12-28 DIAGNOSIS — J40 Bronchitis, not specified as acute or chronic: Secondary | ICD-10-CM | POA: Diagnosis not present

## 2015-01-02 DIAGNOSIS — N138 Other obstructive and reflux uropathy: Secondary | ICD-10-CM | POA: Diagnosis not present

## 2015-01-02 DIAGNOSIS — Z8551 Personal history of malignant neoplasm of bladder: Secondary | ICD-10-CM | POA: Diagnosis not present

## 2015-01-02 DIAGNOSIS — R972 Elevated prostate specific antigen [PSA]: Secondary | ICD-10-CM | POA: Diagnosis not present

## 2015-01-02 DIAGNOSIS — N401 Enlarged prostate with lower urinary tract symptoms: Secondary | ICD-10-CM | POA: Diagnosis not present

## 2015-01-05 DIAGNOSIS — R05 Cough: Secondary | ICD-10-CM | POA: Diagnosis not present

## 2015-01-05 DIAGNOSIS — E538 Deficiency of other specified B group vitamins: Secondary | ICD-10-CM | POA: Diagnosis not present

## 2015-01-05 DIAGNOSIS — R5383 Other fatigue: Secondary | ICD-10-CM | POA: Diagnosis not present

## 2015-01-24 DIAGNOSIS — J449 Chronic obstructive pulmonary disease, unspecified: Secondary | ICD-10-CM | POA: Diagnosis not present

## 2015-02-08 DIAGNOSIS — Z125 Encounter for screening for malignant neoplasm of prostate: Secondary | ICD-10-CM | POA: Diagnosis not present

## 2015-02-08 DIAGNOSIS — E538 Deficiency of other specified B group vitamins: Secondary | ICD-10-CM | POA: Diagnosis not present

## 2015-02-08 DIAGNOSIS — E119 Type 2 diabetes mellitus without complications: Secondary | ICD-10-CM | POA: Diagnosis not present

## 2015-02-08 DIAGNOSIS — I4891 Unspecified atrial fibrillation: Secondary | ICD-10-CM | POA: Diagnosis not present

## 2015-02-08 DIAGNOSIS — I10 Essential (primary) hypertension: Secondary | ICD-10-CM | POA: Diagnosis not present

## 2015-02-13 DIAGNOSIS — I4891 Unspecified atrial fibrillation: Secondary | ICD-10-CM | POA: Diagnosis not present

## 2015-02-13 DIAGNOSIS — E119 Type 2 diabetes mellitus without complications: Secondary | ICD-10-CM | POA: Diagnosis not present

## 2015-02-13 DIAGNOSIS — I639 Cerebral infarction, unspecified: Secondary | ICD-10-CM | POA: Diagnosis not present

## 2015-02-13 DIAGNOSIS — I10 Essential (primary) hypertension: Secondary | ICD-10-CM | POA: Diagnosis not present

## 2015-02-24 DIAGNOSIS — J449 Chronic obstructive pulmonary disease, unspecified: Secondary | ICD-10-CM | POA: Diagnosis not present

## 2015-03-25 DIAGNOSIS — R51 Headache: Secondary | ICD-10-CM | POA: Diagnosis not present

## 2015-05-11 DIAGNOSIS — E119 Type 2 diabetes mellitus without complications: Secondary | ICD-10-CM | POA: Diagnosis not present

## 2015-05-11 DIAGNOSIS — I10 Essential (primary) hypertension: Secondary | ICD-10-CM | POA: Diagnosis not present

## 2015-05-17 DIAGNOSIS — I480 Paroxysmal atrial fibrillation: Secondary | ICD-10-CM | POA: Diagnosis not present

## 2015-05-17 DIAGNOSIS — I10 Essential (primary) hypertension: Secondary | ICD-10-CM | POA: Diagnosis not present

## 2015-05-17 DIAGNOSIS — E119 Type 2 diabetes mellitus without complications: Secondary | ICD-10-CM | POA: Diagnosis not present

## 2015-05-17 DIAGNOSIS — I6930 Unspecified sequelae of cerebral infarction: Secondary | ICD-10-CM | POA: Diagnosis not present

## 2015-06-05 DIAGNOSIS — J42 Unspecified chronic bronchitis: Secondary | ICD-10-CM | POA: Diagnosis not present

## 2015-06-05 DIAGNOSIS — R0602 Shortness of breath: Secondary | ICD-10-CM | POA: Diagnosis not present

## 2015-06-05 DIAGNOSIS — R05 Cough: Secondary | ICD-10-CM | POA: Diagnosis not present

## 2015-06-05 DIAGNOSIS — E118 Type 2 diabetes mellitus with unspecified complications: Secondary | ICD-10-CM | POA: Diagnosis not present

## 2015-06-06 ENCOUNTER — Other Ambulatory Visit: Payer: Self-pay | Admitting: Internal Medicine

## 2015-06-06 DIAGNOSIS — R059 Cough, unspecified: Secondary | ICD-10-CM

## 2015-06-06 DIAGNOSIS — R05 Cough: Secondary | ICD-10-CM

## 2015-06-13 IMAGING — CT CT CHEST W/O CM
2 of 3 series · 15 of 36 positions shown, 18 images · non-contrast
Comparison: US THORACENTESIS ASP PLEURAL SPACE W/IMG GUIDE dated
12/24/2013; DG CHEST 1V dated 12/14/2013; CT CHEST W/O CM dated
10/31/2013

CLINICAL DATA: Follow up pleural effusion following left-sided
thoracentesis today.

EXAM:
CT CHEST WITHOUT CONTRAST
TECHNIQUE: Multidetector CT imaging of the chest was performed following the
standard protocol without IV contrast.

[Series 2: chest without · axial · non-contrast · 0.83mm/px · z∈[-371,-51]mm · 12 of 76 slices shown, 15 images]
[im 6/76  mediastinal]
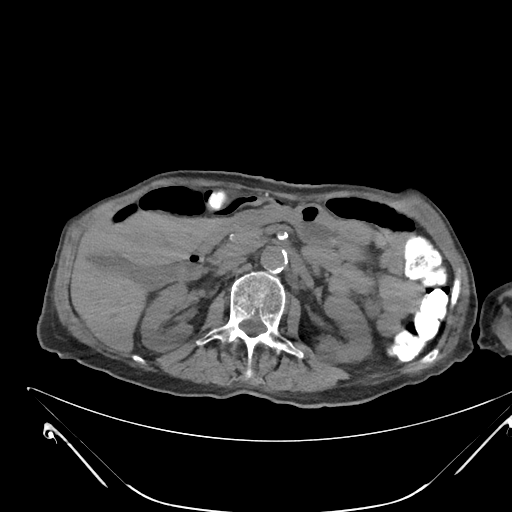
[im 6/76  lung]
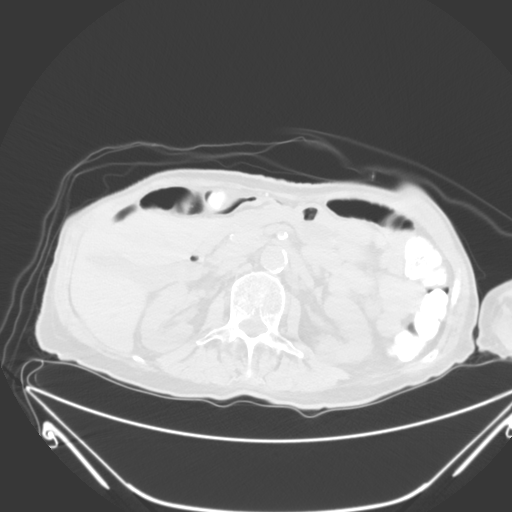
[im 12/76  lung]
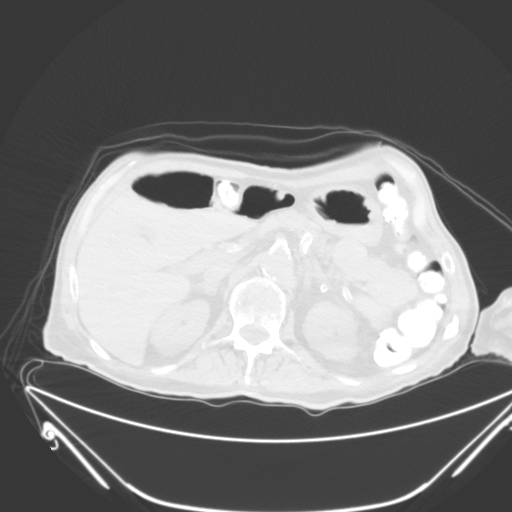
[im 17/76  lung]
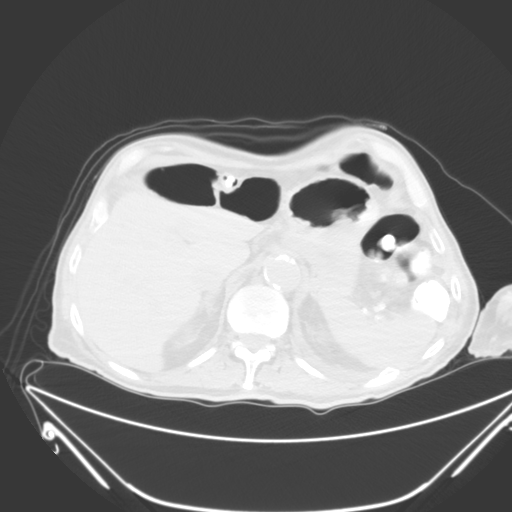
[im 23/76  lung]
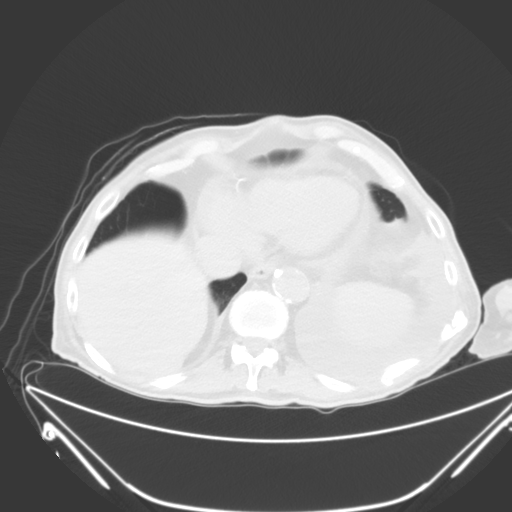
[im 28/76  mediastinal]
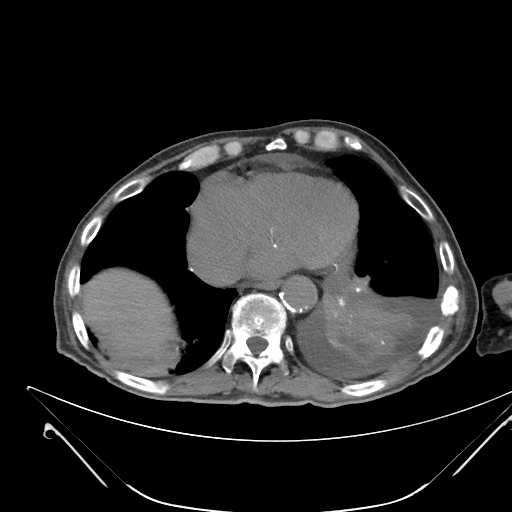
[im 28/76  lung]
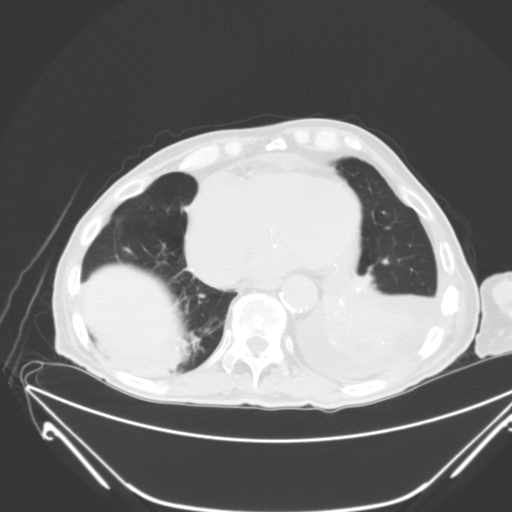
[im 34/76  lung]
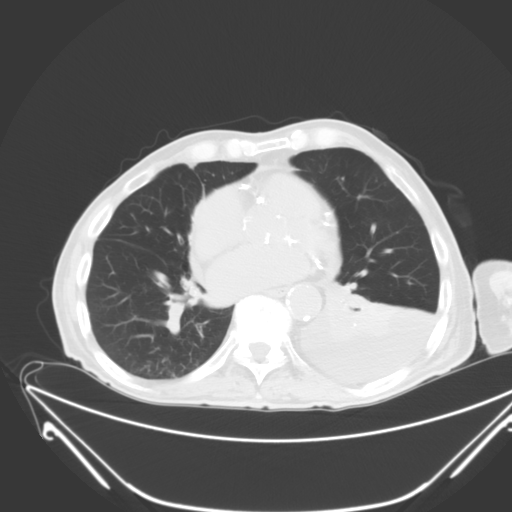
[im 42/76  lung]
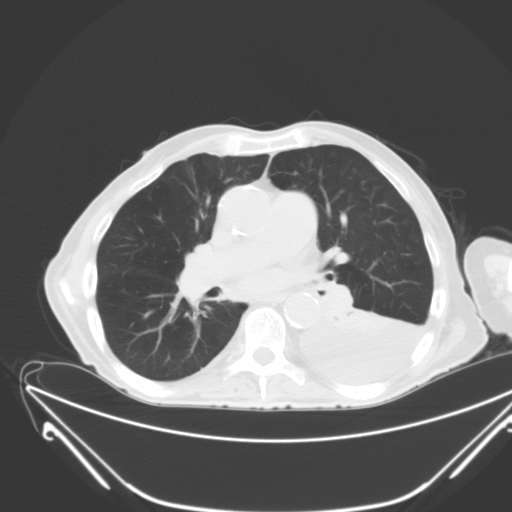
[im 48/76  lung]
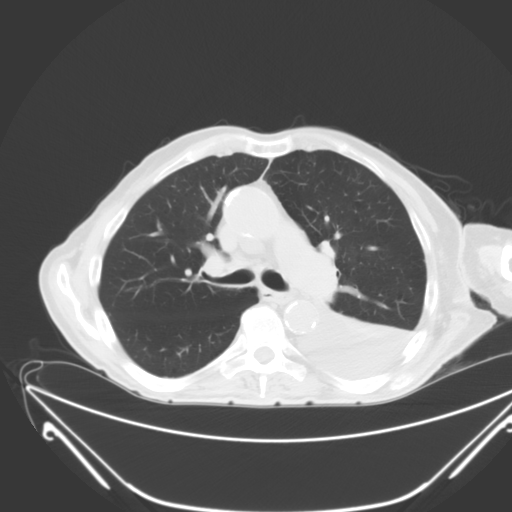
[im 53/76  mediastinal]
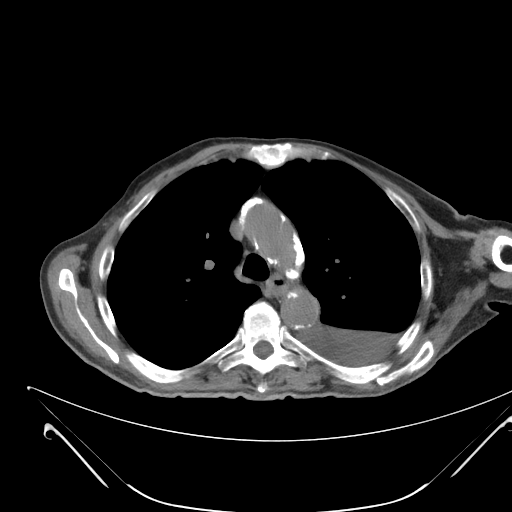
[im 53/76  lung]
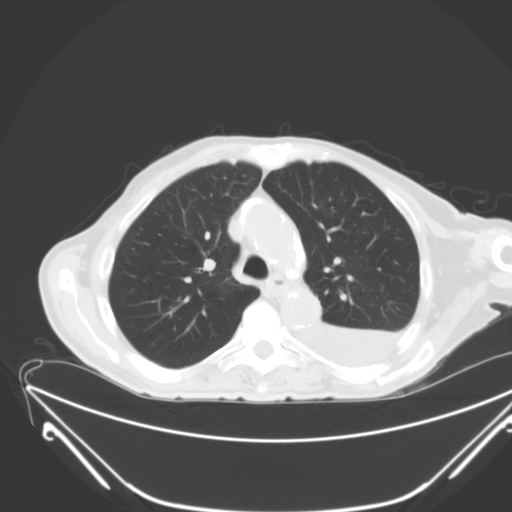
[im 59/76  lung]
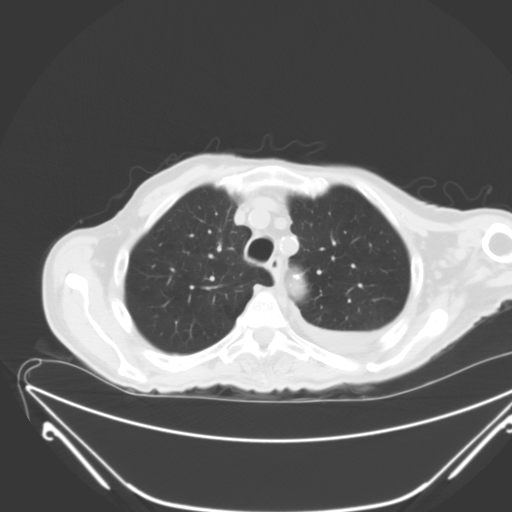
[im 64/76  lung]
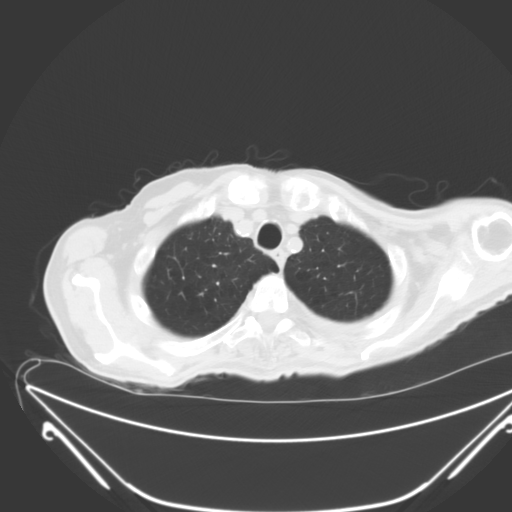
[im 70/76  lung]
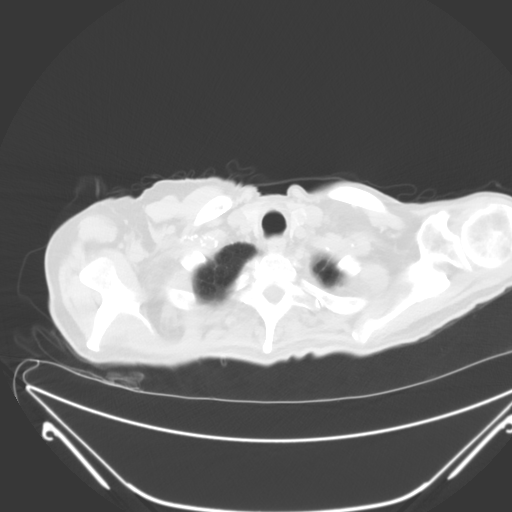

[mpr, coronal, coronal · coronal · 0.83mm/px · 3 of 96 slices shown]
[im 20/96  lung]
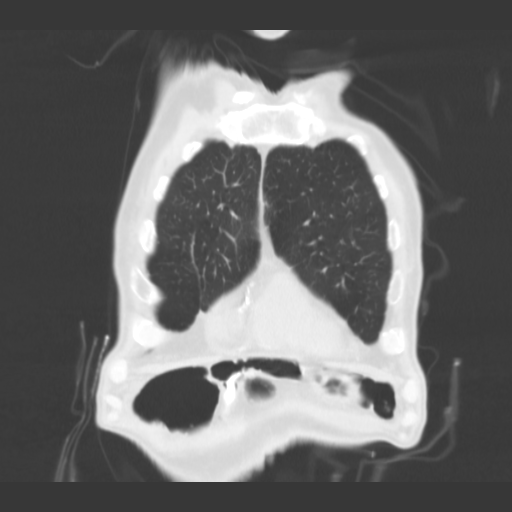
[im 39/96  lung]
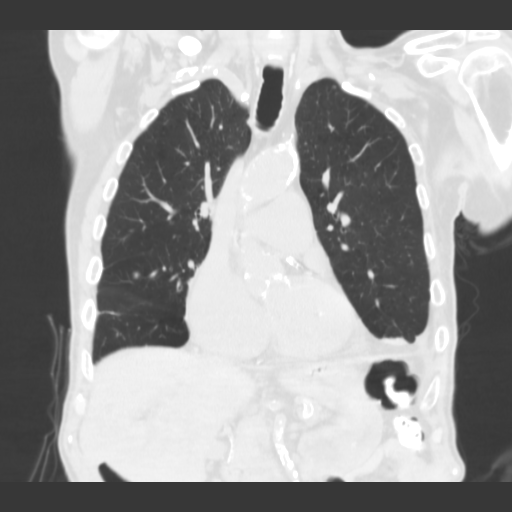
[im 58/96  lung]
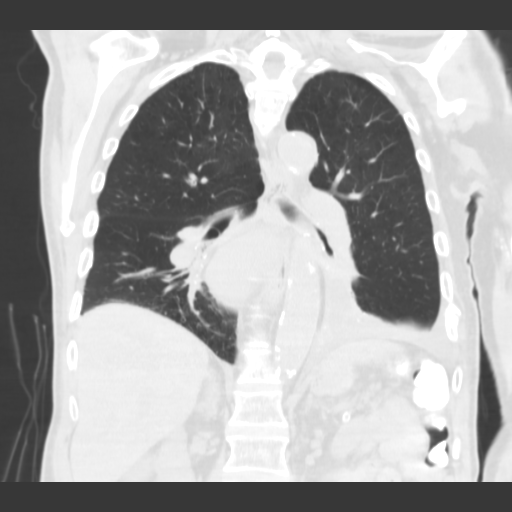

[15 of 36 positions shown; findings below may reference images not displayed]

FINDINGS: Dependent left pleural effusion is similar in volume to the prior
CT. There is a small amount of pericardial fluid. No significant
right-sided pleural effusion is present. There is no pneumothorax.

Peripherally calcified 1.6 cm left thyroid lesion on image 5 is
stable. There are no enlarged mediastinal, hilar or axillary lymph
nodes. There is extensive atherosclerosis of the aorta, great
vessels and coronary arteries. Central enlargement of the pulmonary
arteries is again noted.

There is progressive left lower lobe collapse, now complete. There
is no obvious endobronchial lesion or perihilar mass on this
noncontrast study. There is high-density within the collapse left
lower lobe, suggesting chronic aspiration. Increased patchy
peribronchial opacity in the right lower lobe may reflect
atelectasis or aspiration. Right middle lobe granuloma appears
stable. The lungs are otherwise clear with moderate emphysematous
changes.

Subacute mildly displaced fractures of the left ninth through
eleventh ribs are again noted without evidence of significant
interval healing. There are several other old rib fractures
bilaterally. Degenerative changes in the spine are stable. The
visualized upper abdomen has a stable appearance with low-density
left renal lesions and nonobstructing caliceal calculi on the right.
IMPRESSION: 1. The left pleural effusion has not significantly changed in volume
compared with the CT performed approximately 2 months ago.
2. Progressive left lower lobe collapse without clear etiology.
There is high-density particular matter within the collapsed left
lower lobe, suggesting chronic aspiration. Depending on the results
of thoracentesis, bronchoscopy may be warranted to exclude a
centrally obstructing lesion.
3. Increased patchy airspace disease at the right lung base,
possibly atelectasis or aspiration.
4. Several subacute left-sided rib fractures again noted, without
significant change.
5. Extensive atherosclerosis.

## 2015-06-14 ENCOUNTER — Other Ambulatory Visit: Payer: Self-pay

## 2015-06-14 ENCOUNTER — Ambulatory Visit
Admission: RE | Admit: 2015-06-14 | Discharge: 2015-06-14 | Disposition: A | Discharge status: Home / Self Care | Payer: Medicare Other | Source: Ambulatory Visit | Attending: Internal Medicine | Admitting: Internal Medicine

## 2015-06-14 DIAGNOSIS — R918 Other nonspecific abnormal finding of lung field: Secondary | ICD-10-CM | POA: Diagnosis not present

## 2015-06-14 DIAGNOSIS — R059 Cough, unspecified: Secondary | ICD-10-CM

## 2015-06-14 DIAGNOSIS — R05 Cough: Secondary | ICD-10-CM

## 2015-06-14 MED ORDER — IOPAMIDOL (ISOVUE-300) INJECTION 61%
75.0000 mL | Freq: Once | INTRAVENOUS | Status: AC | PRN
  Administered 2015-06-14: 75 mL via INTRAVENOUS

## 2015-06-22 ENCOUNTER — Other Ambulatory Visit: Payer: Self-pay | Admitting: Internal Medicine

## 2015-06-22 ENCOUNTER — Telehealth: Payer: Self-pay | Admitting: Internal Medicine

## 2015-06-22 DIAGNOSIS — E119 Type 2 diabetes mellitus without complications: Secondary | ICD-10-CM | POA: Diagnosis not present

## 2015-06-22 DIAGNOSIS — R0600 Dyspnea, unspecified: Secondary | ICD-10-CM | POA: Diagnosis not present

## 2015-06-22 DIAGNOSIS — I4891 Unspecified atrial fibrillation: Secondary | ICD-10-CM | POA: Diagnosis not present

## 2015-06-22 DIAGNOSIS — I10 Essential (primary) hypertension: Secondary | ICD-10-CM | POA: Diagnosis not present

## 2015-06-22 NOTE — Telephone Encounter (Signed)
Pt sched, nothing further needed

## 2015-06-22 NOTE — Telephone Encounter (Signed)
Called Pat and LMTCB x1 Can offer MR in the AM at 10:15 held spot per elise.

## 2015-06-23 ENCOUNTER — Encounter: Payer: Self-pay | Admitting: Internal Medicine

## 2015-06-23 ENCOUNTER — Ambulatory Visit (INDEPENDENT_AMBULATORY_CARE_PROVIDER_SITE_OTHER): Payer: Medicare Other | Admitting: Internal Medicine

## 2015-06-23 ENCOUNTER — Ambulatory Visit
Admission: RE | Admit: 2015-06-23 | Discharge: 2015-06-23 | Disposition: A | Discharge status: Home / Self Care | Payer: Medicare Other | Source: Ambulatory Visit | Attending: Internal Medicine | Admitting: Internal Medicine

## 2015-06-23 VITALS — BP 108/70 | HR 52 | Ht 66.0 in | Wt 132.0 lb

## 2015-06-23 DIAGNOSIS — I2699 Other pulmonary embolism without acute cor pulmonale: Secondary | ICD-10-CM | POA: Insufficient documentation

## 2015-06-23 DIAGNOSIS — J181 Lobar pneumonia, unspecified organism: Principal | ICD-10-CM

## 2015-06-23 DIAGNOSIS — J189 Pneumonia, unspecified organism: Secondary | ICD-10-CM

## 2015-06-23 DIAGNOSIS — R0600 Dyspnea, unspecified: Secondary | ICD-10-CM

## 2015-06-23 DIAGNOSIS — I2782 Chronic pulmonary embolism: Secondary | ICD-10-CM | POA: Diagnosis not present

## 2015-06-23 MED ORDER — IOPAMIDOL (ISOVUE-370) INJECTION 76%
100.0000 mL | Freq: Once | INTRAVENOUS | Status: AC | PRN
  Administered 2015-06-23: 100 mL via INTRAVENOUS

## 2015-06-23 NOTE — Progress Notes (Signed)
Subjective:    Patient ID: Ander Gaster, male    DOB: 12/27/1927, 81 y.o.   MRN: [id number]  HPI  OV 06/23/2015  Chief Complaint  Patient presents with  . Follow-up    [name] suggested pt come for f/u. Pt c/o SOB with activity and rest, prod cough with thick white mucus. Pt c/o left lateral rib pain.     This is an 81 year old debilitated gentleman who suffered a stroke 3 years ago. I had seen him a year ago for left-sided pleural effusion that initially was exudative but on repeat was transudative. It was resolved that this was likely due to aspiration pneumonia on the left side related passive pleural effusion. After that he was lost to follow-up.  Currently he presents with his significant other of 40 years. She is unsure why they're here but review of outside records from [name] and review of the radiologic images that I personally visualized so that at least back in March 2016 chest x-ray his left-sided effusion had resolved and his right-sided also looked fine. Then by June 2016 he developed small right-sided pleural effusion was confirmed in September 2016. and chest x-ray. This resulted in a CT chest on 06/14/2015 that shows right lower lobe pneumonia with passive associated small pleural effusion. Wife denies any fever but reports ongoing cough that is likely chronic with white sputum. Patient is currently on Levaquin for 10 days. Off note, on the CT does report of possible right lower lobe pulmonary embolism but is not clear-cut  Wife reports that since the stroke 3 years ago he is slowly and steadily improved. He is still physically deconditioned. He lives a chair existence. He occasionally walks to the front porch and to the dining table. She denies aspiration but when I questioned her more carefully she does admit aspiration with liquids such as coffee. In addition October 2015 swallow study showed continued dysphagia and need for dysphagia 3 diet. Patient currently is on a  full regular diet  Anti-infectives    None      Current outpatient prescriptions:  .  allopurinol (ZYLOPRIM) 100 MG tablet, Take 100 mg by mouth 2 (two) times daily. , Disp: , Rfl:  .  budesonide (PULMICORT) 0.5 MG/2ML nebulizer solution, Take 0.5 mg by nebulization 2 (two) times daily as needed (for wheezing/shortness of breath). , Disp: , Rfl:  .  carvedilol (COREG) 6.25 MG tablet, Take 6.25 mg by mouth 2 (two) times daily with a meal., Disp: , Rfl:  .  cilostazol (PLETAL) 50 MG tablet, Take 50 mg by mouth 2 (two) times daily., Disp: , Rfl:  .  colchicine 0.6 MG tablet, Take 1 tablet (0.6 mg total) by mouth 2 (two) times daily., Disp: 10 tablet, Rfl: 0 .  loratadine (CLARITIN) 10 MG tablet, Take 10 mg by mouth daily., Disp: , Rfl:  .  magnesium oxide (MAG-OX) 400 MG tablet, Take 200 mg by mouth daily., Disp: , Rfl:  .  Melatonin 3 MG TABS, Take 3 mg by mouth at bedtime as needed. , Disp: , Rfl:  .  metFORMIN (GLUCOPHAGE) 1000 MG tablet, Take 500 mg by mouth 2 (two) times daily with a meal., Disp: , Rfl:  .  Multiple Vitamins-Minerals (OCUVITE PRESERVISION PO), Take 1 tablet by mouth daily., Disp: , Rfl:  .  pantoprazole (PROTONIX) 40 MG tablet, Take 1 tablet (40 mg total) by mouth 2 (two) times daily., Disp: 30 tablet, Rfl: 0 .  simvastatin (ZOCOR) 40 MG  tablet, Take 40 mg by mouth every evening., Disp: , Rfl:  .  STARCH-MALTO DEXTRIN (THICK-IT) POWD, 1 scoop by Other route 3 (three) times daily with meals. To make liquids nectar thick, Disp: , Rfl:  .  tiotropium (SPIRIVA) 18 MCG inhalation capsule, Place 18 mcg into inhaler and inhale daily., Disp: , Rfl:  .  traMADol (ULTRAM) 50 MG tablet, Take 1 tablet (50 mg total) by mouth every 6 (six) hours as needed for moderate pain., Disp: 120 tablet, Rfl: 0 .  vitamin B-12 (CYANOCOBALAMIN) 1000 MCG tablet, Take 1,000 mcg by mouth daily., Disp: , Rfl:  .  albuterol (PROVENTIL) (2.5 MG/3ML) 0.083% nebulizer solution, Take 2.5 mg by nebulization 4  (four) times daily., Disp: , Rfl:  .  ranitidine (ZANTAC) 300 MG tablet, Take 300 mg by mouth at bedtime., Disp: , Rfl:     Review of Systems - Physically deconditioned, chronic cough, poor baseline status, - Otherwise per history of present illness - Otherwise 11 point review of systems is negative    Objective:   Physical Exam  Constitutional: He appears well-developed and well-nourished. No distress.  Debilitated male sitting in a wheelchair  HENT:  Head: Normocephalic and atraumatic.  Right Ear: External ear normal.  Left Ear: External ear normal.  Mouth/Throat: Oropharynx is clear and moist. No oropharyngeal exudate.  Eyes: Conjunctivae and EOM are normal. Pupils are equal, round, and reactive to light. Right eye exhibits no discharge. Left eye exhibits no discharge. No scleral icterus.  Neck: Normal range of motion. Neck supple. No JVD present. No tracheal deviation present. No thyromegaly present.  Cardiovascular: Normal rate, regular rhythm and intact distal pulses.  Exam reveals no gallop and no friction rub.   No murmur heard. Pulmonary/Chest: Effort normal and breath sounds normal. No respiratory distress. He has no wheezes. He has no rales. He exhibits no tenderness.  Kyphotic Basal crackles present  Abdominal: Soft. Bowel sounds are normal. He exhibits no distension and no mass. There is no tenderness. There is no rebound and no guarding.  Musculoskeletal: Normal range of motion. He exhibits no edema or tenderness.  Left that appears weak  Lymphadenopathy:    He has no cervical adenopathy.  Neurological: He is alert. No cranial nerve deficit. Coordination abnormal.   For historian Full sentences  Skin: Skin is warm and dry. No rash noted. He is not diaphoretic. No erythema. No pallor.  Psychiatric: Judgment normal.  Nursing note and vitals reviewed.   Filed Vitals:   06/23/15 1026  BP: 108/70  Pulse: 52  Height: 5\' 6"  (1.676 m)  Weight: 132 lb (59.875 kg)    SpO2: 95%          Assessment & Plan:     ICD-9-CM ICD-10-CM   1. Right lower lobe pneumonia 486 J18.9 DG Chest 2 View  2. Pulmonary embolism on right 415.19 I26.99 VAS [place] LOWER EXTREMITY VENOUS (DVT)    - I think is ongoing aspiration related pneumonia. Given his debility and previous stroke is very little we can do about it. I think for now we'll treat him with antibodies as advised by [name] his primary care physician. We will reevaluate with a chest x-ray in 4 weeks. I think you'll benefit from home physical therapy  Re:  pulmonary embolism: Clinically this to me looks like a red herring. In any event anticoagulation will be risky given falls and debility. We will get a duplex lower extremity look for DVT as a surrogate marker  I think overall focusing on his dysphagia and physical deconditioning would be best strategy to give him good quality of life   > 50% of this > 25 min visit spent in face to face counseling or coordination of care    Dr. Brand Males, M.D., [facility].C.P Pulmonary and Critical Care Medicine Staff Physician [facility] Pager: [phone], If no answer or between  15:00h - 7:00h: call [phone]  06/23/2015 11:03 AM

## 2015-06-23 NOTE — Patient Instructions (Signed)
ICD-9-CM ICD-10-CM   1. Right lower lobe pneumonia 486 J18.9   2. Pulmonary embolism on right 415.19 I26.99    Suspect you are aspirating Finish levofloxacin by Dr Maudie Mercury Do Duplex Lower extremity anytime- doubt you have real PE REpeat CXR in 4 weeks  Followup Will call wiuth duplex LE results - next few to several days After CXR in 4 weeks to see NP Tammy or me

## 2015-06-30 ENCOUNTER — Ambulatory Visit (HOSPITAL_COMMUNITY)
Admission: RE | Admit: 2015-06-30 | Discharge: 2015-06-30 | Disposition: A | Discharge status: Home / Self Care | Payer: Medicare Other | Source: Ambulatory Visit | Attending: Cardiovascular Disease | Admitting: Cardiovascular Disease

## 2015-06-30 DIAGNOSIS — I4891 Unspecified atrial fibrillation: Secondary | ICD-10-CM | POA: Insufficient documentation

## 2015-06-30 DIAGNOSIS — E119 Type 2 diabetes mellitus without complications: Secondary | ICD-10-CM | POA: Diagnosis not present

## 2015-06-30 DIAGNOSIS — Z87891 Personal history of nicotine dependence: Secondary | ICD-10-CM | POA: Diagnosis not present

## 2015-06-30 DIAGNOSIS — I2699 Other pulmonary embolism without acute cor pulmonale: Secondary | ICD-10-CM | POA: Insufficient documentation

## 2015-06-30 DIAGNOSIS — Z8673 Personal history of transient ischemic attack (TIA), and cerebral infarction without residual deficits: Secondary | ICD-10-CM | POA: Insufficient documentation

## 2015-06-30 DIAGNOSIS — I739 Peripheral vascular disease, unspecified: Secondary | ICD-10-CM | POA: Insufficient documentation

## 2015-06-30 DIAGNOSIS — E785 Hyperlipidemia, unspecified: Secondary | ICD-10-CM | POA: Insufficient documentation

## 2015-07-03 ENCOUNTER — Other Ambulatory Visit (HOSPITAL_COMMUNITY): Payer: Self-pay

## 2015-07-03 ENCOUNTER — Other Ambulatory Visit: Payer: Self-pay

## 2015-07-06 DIAGNOSIS — I2699 Other pulmonary embolism without acute cor pulmonale: Secondary | ICD-10-CM | POA: Diagnosis not present

## 2015-07-06 DIAGNOSIS — I10 Essential (primary) hypertension: Secondary | ICD-10-CM | POA: Diagnosis not present

## 2015-07-06 DIAGNOSIS — I480 Paroxysmal atrial fibrillation: Secondary | ICD-10-CM | POA: Diagnosis not present

## 2015-07-09 DIAGNOSIS — Z79891 Long term (current) use of opiate analgesic: Secondary | ICD-10-CM | POA: Diagnosis not present

## 2015-07-09 DIAGNOSIS — I272 Other secondary pulmonary hypertension: Secondary | ICD-10-CM | POA: Diagnosis not present

## 2015-07-09 DIAGNOSIS — I69354 Hemiplegia and hemiparesis following cerebral infarction affecting left non-dominant side: Secondary | ICD-10-CM | POA: Diagnosis not present

## 2015-07-09 DIAGNOSIS — M519 Unspecified thoracic, thoracolumbar and lumbosacral intervertebral disc disorder: Secondary | ICD-10-CM | POA: Diagnosis not present

## 2015-07-09 DIAGNOSIS — E785 Hyperlipidemia, unspecified: Secondary | ICD-10-CM | POA: Diagnosis not present

## 2015-07-09 DIAGNOSIS — Z7984 Long term (current) use of oral hypoglycemic drugs: Secondary | ICD-10-CM | POA: Diagnosis not present

## 2015-07-09 DIAGNOSIS — Z7901 Long term (current) use of anticoagulants: Secondary | ICD-10-CM | POA: Diagnosis not present

## 2015-07-09 DIAGNOSIS — I2699 Other pulmonary embolism without acute cor pulmonale: Secondary | ICD-10-CM | POA: Diagnosis not present

## 2015-07-09 DIAGNOSIS — I10 Essential (primary) hypertension: Secondary | ICD-10-CM | POA: Diagnosis not present

## 2015-07-09 DIAGNOSIS — J449 Chronic obstructive pulmonary disease, unspecified: Secondary | ICD-10-CM | POA: Diagnosis not present

## 2015-07-09 DIAGNOSIS — I480 Paroxysmal atrial fibrillation: Secondary | ICD-10-CM | POA: Diagnosis not present

## 2015-07-09 DIAGNOSIS — E1140 Type 2 diabetes mellitus with diabetic neuropathy, unspecified: Secondary | ICD-10-CM | POA: Diagnosis not present

## 2015-07-10 DIAGNOSIS — I2699 Other pulmonary embolism without acute cor pulmonale: Secondary | ICD-10-CM | POA: Diagnosis not present

## 2015-07-10 DIAGNOSIS — Z7901 Long term (current) use of anticoagulants: Secondary | ICD-10-CM | POA: Diagnosis not present

## 2015-07-10 DIAGNOSIS — Z5181 Encounter for therapeutic drug level monitoring: Secondary | ICD-10-CM | POA: Diagnosis not present

## 2015-07-11 DIAGNOSIS — I272 Other secondary pulmonary hypertension: Secondary | ICD-10-CM | POA: Diagnosis not present

## 2015-07-11 DIAGNOSIS — I480 Paroxysmal atrial fibrillation: Secondary | ICD-10-CM | POA: Diagnosis not present

## 2015-07-11 DIAGNOSIS — E785 Hyperlipidemia, unspecified: Secondary | ICD-10-CM | POA: Diagnosis not present

## 2015-07-11 DIAGNOSIS — Z7984 Long term (current) use of oral hypoglycemic drugs: Secondary | ICD-10-CM | POA: Diagnosis not present

## 2015-07-11 DIAGNOSIS — J449 Chronic obstructive pulmonary disease, unspecified: Secondary | ICD-10-CM | POA: Diagnosis not present

## 2015-07-11 DIAGNOSIS — E1140 Type 2 diabetes mellitus with diabetic neuropathy, unspecified: Secondary | ICD-10-CM | POA: Diagnosis not present

## 2015-07-11 DIAGNOSIS — I2699 Other pulmonary embolism without acute cor pulmonale: Secondary | ICD-10-CM | POA: Diagnosis not present

## 2015-07-11 DIAGNOSIS — I10 Essential (primary) hypertension: Secondary | ICD-10-CM | POA: Diagnosis not present

## 2015-07-11 DIAGNOSIS — I69354 Hemiplegia and hemiparesis following cerebral infarction affecting left non-dominant side: Secondary | ICD-10-CM | POA: Diagnosis not present

## 2015-07-11 DIAGNOSIS — M519 Unspecified thoracic, thoracolumbar and lumbosacral intervertebral disc disorder: Secondary | ICD-10-CM | POA: Diagnosis not present

## 2015-07-11 DIAGNOSIS — Z7901 Long term (current) use of anticoagulants: Secondary | ICD-10-CM | POA: Diagnosis not present

## 2015-07-11 DIAGNOSIS — Z79891 Long term (current) use of opiate analgesic: Secondary | ICD-10-CM | POA: Diagnosis not present

## 2015-07-13 DIAGNOSIS — E785 Hyperlipidemia, unspecified: Secondary | ICD-10-CM | POA: Diagnosis not present

## 2015-07-13 DIAGNOSIS — Z7984 Long term (current) use of oral hypoglycemic drugs: Secondary | ICD-10-CM | POA: Diagnosis not present

## 2015-07-13 DIAGNOSIS — Z7901 Long term (current) use of anticoagulants: Secondary | ICD-10-CM | POA: Diagnosis not present

## 2015-07-13 DIAGNOSIS — E1140 Type 2 diabetes mellitus with diabetic neuropathy, unspecified: Secondary | ICD-10-CM | POA: Diagnosis not present

## 2015-07-13 DIAGNOSIS — Z79891 Long term (current) use of opiate analgesic: Secondary | ICD-10-CM | POA: Diagnosis not present

## 2015-07-13 DIAGNOSIS — I2699 Other pulmonary embolism without acute cor pulmonale: Secondary | ICD-10-CM | POA: Diagnosis not present

## 2015-07-13 DIAGNOSIS — M519 Unspecified thoracic, thoracolumbar and lumbosacral intervertebral disc disorder: Secondary | ICD-10-CM | POA: Diagnosis not present

## 2015-07-13 DIAGNOSIS — I480 Paroxysmal atrial fibrillation: Secondary | ICD-10-CM | POA: Diagnosis not present

## 2015-07-13 DIAGNOSIS — I272 Other secondary pulmonary hypertension: Secondary | ICD-10-CM | POA: Diagnosis not present

## 2015-07-13 DIAGNOSIS — J449 Chronic obstructive pulmonary disease, unspecified: Secondary | ICD-10-CM | POA: Diagnosis not present

## 2015-07-13 DIAGNOSIS — I10 Essential (primary) hypertension: Secondary | ICD-10-CM | POA: Diagnosis not present

## 2015-07-13 DIAGNOSIS — I69354 Hemiplegia and hemiparesis following cerebral infarction affecting left non-dominant side: Secondary | ICD-10-CM | POA: Diagnosis not present

## 2015-07-14 DIAGNOSIS — I10 Essential (primary) hypertension: Secondary | ICD-10-CM | POA: Diagnosis not present

## 2015-07-14 DIAGNOSIS — E1140 Type 2 diabetes mellitus with diabetic neuropathy, unspecified: Secondary | ICD-10-CM | POA: Diagnosis not present

## 2015-07-14 DIAGNOSIS — M519 Unspecified thoracic, thoracolumbar and lumbosacral intervertebral disc disorder: Secondary | ICD-10-CM | POA: Diagnosis not present

## 2015-07-14 DIAGNOSIS — Z79891 Long term (current) use of opiate analgesic: Secondary | ICD-10-CM | POA: Diagnosis not present

## 2015-07-14 DIAGNOSIS — E785 Hyperlipidemia, unspecified: Secondary | ICD-10-CM | POA: Diagnosis not present

## 2015-07-14 DIAGNOSIS — I2699 Other pulmonary embolism without acute cor pulmonale: Secondary | ICD-10-CM | POA: Diagnosis not present

## 2015-07-14 DIAGNOSIS — Z7984 Long term (current) use of oral hypoglycemic drugs: Secondary | ICD-10-CM | POA: Diagnosis not present

## 2015-07-14 DIAGNOSIS — Z7901 Long term (current) use of anticoagulants: Secondary | ICD-10-CM | POA: Diagnosis not present

## 2015-07-14 DIAGNOSIS — I69354 Hemiplegia and hemiparesis following cerebral infarction affecting left non-dominant side: Secondary | ICD-10-CM | POA: Diagnosis not present

## 2015-07-14 DIAGNOSIS — I480 Paroxysmal atrial fibrillation: Secondary | ICD-10-CM | POA: Diagnosis not present

## 2015-07-14 DIAGNOSIS — J449 Chronic obstructive pulmonary disease, unspecified: Secondary | ICD-10-CM | POA: Diagnosis not present

## 2015-07-14 DIAGNOSIS — I272 Other secondary pulmonary hypertension: Secondary | ICD-10-CM | POA: Diagnosis not present

## 2015-07-17 DIAGNOSIS — Z7984 Long term (current) use of oral hypoglycemic drugs: Secondary | ICD-10-CM | POA: Diagnosis not present

## 2015-07-17 DIAGNOSIS — I480 Paroxysmal atrial fibrillation: Secondary | ICD-10-CM | POA: Diagnosis not present

## 2015-07-17 DIAGNOSIS — I69354 Hemiplegia and hemiparesis following cerebral infarction affecting left non-dominant side: Secondary | ICD-10-CM | POA: Diagnosis not present

## 2015-07-17 DIAGNOSIS — I2699 Other pulmonary embolism without acute cor pulmonale: Secondary | ICD-10-CM | POA: Diagnosis not present

## 2015-07-17 DIAGNOSIS — E1140 Type 2 diabetes mellitus with diabetic neuropathy, unspecified: Secondary | ICD-10-CM | POA: Diagnosis not present

## 2015-07-17 DIAGNOSIS — I10 Essential (primary) hypertension: Secondary | ICD-10-CM | POA: Diagnosis not present

## 2015-07-17 DIAGNOSIS — Z7901 Long term (current) use of anticoagulants: Secondary | ICD-10-CM | POA: Diagnosis not present

## 2015-07-17 DIAGNOSIS — I272 Other secondary pulmonary hypertension: Secondary | ICD-10-CM | POA: Diagnosis not present

## 2015-07-17 DIAGNOSIS — J449 Chronic obstructive pulmonary disease, unspecified: Secondary | ICD-10-CM | POA: Diagnosis not present

## 2015-07-17 DIAGNOSIS — M519 Unspecified thoracic, thoracolumbar and lumbosacral intervertebral disc disorder: Secondary | ICD-10-CM | POA: Diagnosis not present

## 2015-07-17 DIAGNOSIS — E785 Hyperlipidemia, unspecified: Secondary | ICD-10-CM | POA: Diagnosis not present

## 2015-07-17 DIAGNOSIS — Z79891 Long term (current) use of opiate analgesic: Secondary | ICD-10-CM | POA: Diagnosis not present

## 2015-07-19 DIAGNOSIS — E785 Hyperlipidemia, unspecified: Secondary | ICD-10-CM | POA: Diagnosis not present

## 2015-07-19 DIAGNOSIS — Z7901 Long term (current) use of anticoagulants: Secondary | ICD-10-CM | POA: Diagnosis not present

## 2015-07-19 DIAGNOSIS — I272 Other secondary pulmonary hypertension: Secondary | ICD-10-CM | POA: Diagnosis not present

## 2015-07-19 DIAGNOSIS — Z79891 Long term (current) use of opiate analgesic: Secondary | ICD-10-CM | POA: Diagnosis not present

## 2015-07-19 DIAGNOSIS — I10 Essential (primary) hypertension: Secondary | ICD-10-CM | POA: Diagnosis not present

## 2015-07-19 DIAGNOSIS — I2699 Other pulmonary embolism without acute cor pulmonale: Secondary | ICD-10-CM | POA: Diagnosis not present

## 2015-07-19 DIAGNOSIS — M519 Unspecified thoracic, thoracolumbar and lumbosacral intervertebral disc disorder: Secondary | ICD-10-CM | POA: Diagnosis not present

## 2015-07-19 DIAGNOSIS — J449 Chronic obstructive pulmonary disease, unspecified: Secondary | ICD-10-CM | POA: Diagnosis not present

## 2015-07-19 DIAGNOSIS — I480 Paroxysmal atrial fibrillation: Secondary | ICD-10-CM | POA: Diagnosis not present

## 2015-07-19 DIAGNOSIS — E1140 Type 2 diabetes mellitus with diabetic neuropathy, unspecified: Secondary | ICD-10-CM | POA: Diagnosis not present

## 2015-07-19 DIAGNOSIS — I69354 Hemiplegia and hemiparesis following cerebral infarction affecting left non-dominant side: Secondary | ICD-10-CM | POA: Diagnosis not present

## 2015-07-19 DIAGNOSIS — Z7984 Long term (current) use of oral hypoglycemic drugs: Secondary | ICD-10-CM | POA: Diagnosis not present

## 2015-07-21 ENCOUNTER — Encounter: Payer: Self-pay | Admitting: Adult Health

## 2015-07-21 ENCOUNTER — Ambulatory Visit (INDEPENDENT_AMBULATORY_CARE_PROVIDER_SITE_OTHER): Payer: Medicare Other | Admitting: Adult Health

## 2015-07-21 ENCOUNTER — Ambulatory Visit (INDEPENDENT_AMBULATORY_CARE_PROVIDER_SITE_OTHER)
Admission: RE | Admit: 2015-07-21 | Discharge: 2015-07-21 | Disposition: A | Discharge status: Home / Self Care | Payer: Medicare Other | Source: Ambulatory Visit | Attending: Internal Medicine | Admitting: Internal Medicine

## 2015-07-21 VITALS — BP 102/62 | HR 73 | Temp 97.4°F | Ht 66.0 in | Wt 134.6 lb

## 2015-07-21 DIAGNOSIS — J439 Emphysema, unspecified: Secondary | ICD-10-CM

## 2015-07-21 DIAGNOSIS — J189 Pneumonia, unspecified organism: Secondary | ICD-10-CM

## 2015-07-21 DIAGNOSIS — I2699 Other pulmonary embolism without acute cor pulmonale: Secondary | ICD-10-CM

## 2015-07-21 DIAGNOSIS — Z7901 Long term (current) use of anticoagulants: Secondary | ICD-10-CM | POA: Diagnosis not present

## 2015-07-21 DIAGNOSIS — R918 Other nonspecific abnormal finding of lung field: Secondary | ICD-10-CM | POA: Diagnosis not present

## 2015-07-21 DIAGNOSIS — J181 Lobar pneumonia, unspecified organism: Principal | ICD-10-CM

## 2015-07-21 NOTE — Progress Notes (Signed)
   Subjective:    Patient ID: [name], male    DOB: [birth year], 81 y.o.   MRN: [id number] 81 yo mlae with COPD and PE  with previous hx of stroke with left sided weakness.   07/21/2015 Follow up : PNA ? Aspiration  Pt returns with his wife. For 1 month follow up  He has recently been dx with PNA and PE . He had a CT chest 06/14/15 that showed subacute to chronic right PE . Bibasilar opacities . He was started on coumadin and treated with abx.  He returns today for follow up . CXR shows LLL  Persistent masslike opacity .  He has had a increaesd wet cough with yellow mucus, wheezing and SOB. He was seen by PCP  And started on levaquin ,he is on day 5/7 .  He is starting to feel better.  No hemoptysis, chest pain, orthopnea, fever, edema.        Ros Constitutional:   No  weight loss, night sweats,  Fevers, chills,  +fatigue, or  lassitude.  HEENT:   No headaches,  Difficulty swallowing,  Tooth/dental problems, or  Sore throat,                No sneezing, itching, ear ache,  +nasal congestion, post nasal drip,   CV:  No chest pain,  Orthopnea, PND, swelling in lower extremities, anasarca, dizziness, palpitations, syncope.   GI  No heartburn, indigestion, abdominal pain, nausea, vomiting, diarrhea, change in bowel habits, loss of appetite, bloody stools.   Resp:    No chest wall deformity  Skin: no rash or lesions.  GU: no dysuria, change in color of urine, no urgency or frequency.  No flank pain, no hematuria   MS:  No joint pain or swelling.  No decreased range of motion.  No back pain.  Psych:  No change in mood or affect. No depression or anxiety.  No memory loss.        Objective:   Physical Exam  Constitutional: He appears well-developed and well-nourished. No distress.  Debilitated male sitting in a wheelchair  HENT:  Head: Normocephalic and atraumatic.  Right Ear: External ear normal.  Left Ear: External ear normal.  Mouth/Throat: Oropharynx is clear and  moist. No oropharyngeal exudate.  Eyes: Conjunctivae and EOM are normal. Pupils are equal, round, and reactive to light. Right eye exhibits no discharge. Left eye exhibits no discharge. No scleral icterus.  Neck: Normal range of motion. Neck supple. No JVD present. No tracheal deviation present. No thyromegaly present.  Cardiovascular: Normal rate, regular rhythm and intact distal pulses.  Exam reveals no gallop and no friction rub.   No murmur heard. Pulmonary/Chest: Effort normal and breath sounds normal. No respiratory distress. He has no wheezes. He has no rales. He exhibits no tenderness.  Kyphotic Basal crackles few scattered rhonchi  Abdominal: Soft. Bowel sounds are normal. He exhibits no distension and no mass. There is no tenderness. There is no rebound and no guarding.  Musculoskeletal: Normal range of motion. He exhibits no edema or tenderness.  Left that appears weak  Lymphadenopathy:    He has no cervical adenopathy.  Neurological: He is alert. No cranial nerve deficit. Coordination abnormal.  Skin: Skin is warm and dry. No rash noted. He is not diaphoretic. No erythema. No pallor.  Psychiatric: Judgment normal.  Nursing note and vitals reviewed.          Assessment & Plan:

## 2015-07-21 NOTE — Assessment & Plan Note (Signed)
Cont on current regimen   Plan  Finish levaquin .  Continue on Pulmicort neb Twice daily   Continue on Spiriva daily .  follow up Dr. Chase Caller in 4-6 weeks with chest xray  Please contact office for sooner follow up if symptoms do not improve or worsen or seek emergency care

## 2015-07-21 NOTE — Patient Instructions (Addendum)
Finish levaquin .  Continue on Pulmicort neb Twice daily   Continue on Spiriva daily .  follow up Dr. Chase Caller in 4-6 weeks with chest xray  Please contact office for sooner follow up if symptoms do not improve or worsen or seek emergency care

## 2015-07-21 NOTE — Assessment & Plan Note (Signed)
Cont on coumadin

## 2015-07-21 NOTE — Assessment & Plan Note (Signed)
Persistent /Recurrent PNA  Will need further follow up .  May need repeat CT if cxr is not improved, pt is a former smoker -could represent underlying malignancy as with recent PE .   Plan  Finish levaquin .  Continue on Pulmicort neb Twice daily   Continue on Spiriva daily .  follow up Dr. Chase Caller in 4-6 weeks with chest xray  Please contact office for sooner follow up if symptoms do not improve or worsen or seek emergency care

## 2015-07-25 DIAGNOSIS — Z7984 Long term (current) use of oral hypoglycemic drugs: Secondary | ICD-10-CM | POA: Diagnosis not present

## 2015-07-25 DIAGNOSIS — I480 Paroxysmal atrial fibrillation: Secondary | ICD-10-CM | POA: Diagnosis not present

## 2015-07-25 DIAGNOSIS — E785 Hyperlipidemia, unspecified: Secondary | ICD-10-CM | POA: Diagnosis not present

## 2015-07-25 DIAGNOSIS — E1140 Type 2 diabetes mellitus with diabetic neuropathy, unspecified: Secondary | ICD-10-CM | POA: Diagnosis not present

## 2015-07-25 DIAGNOSIS — I10 Essential (primary) hypertension: Secondary | ICD-10-CM | POA: Diagnosis not present

## 2015-07-25 DIAGNOSIS — I272 Other secondary pulmonary hypertension: Secondary | ICD-10-CM | POA: Diagnosis not present

## 2015-07-25 DIAGNOSIS — M519 Unspecified thoracic, thoracolumbar and lumbosacral intervertebral disc disorder: Secondary | ICD-10-CM | POA: Diagnosis not present

## 2015-07-25 DIAGNOSIS — I2699 Other pulmonary embolism without acute cor pulmonale: Secondary | ICD-10-CM | POA: Diagnosis not present

## 2015-07-25 DIAGNOSIS — Z79891 Long term (current) use of opiate analgesic: Secondary | ICD-10-CM | POA: Diagnosis not present

## 2015-07-25 DIAGNOSIS — Z7901 Long term (current) use of anticoagulants: Secondary | ICD-10-CM | POA: Diagnosis not present

## 2015-07-25 DIAGNOSIS — J449 Chronic obstructive pulmonary disease, unspecified: Secondary | ICD-10-CM | POA: Diagnosis not present

## 2015-07-25 DIAGNOSIS — I69354 Hemiplegia and hemiparesis following cerebral infarction affecting left non-dominant side: Secondary | ICD-10-CM | POA: Diagnosis not present

## 2015-07-27 DIAGNOSIS — Z79891 Long term (current) use of opiate analgesic: Secondary | ICD-10-CM | POA: Diagnosis not present

## 2015-07-27 DIAGNOSIS — Z7901 Long term (current) use of anticoagulants: Secondary | ICD-10-CM | POA: Diagnosis not present

## 2015-07-27 DIAGNOSIS — E785 Hyperlipidemia, unspecified: Secondary | ICD-10-CM | POA: Diagnosis not present

## 2015-07-27 DIAGNOSIS — Z7984 Long term (current) use of oral hypoglycemic drugs: Secondary | ICD-10-CM | POA: Diagnosis not present

## 2015-07-27 DIAGNOSIS — I10 Essential (primary) hypertension: Secondary | ICD-10-CM | POA: Diagnosis not present

## 2015-07-27 DIAGNOSIS — E1140 Type 2 diabetes mellitus with diabetic neuropathy, unspecified: Secondary | ICD-10-CM | POA: Diagnosis not present

## 2015-07-27 DIAGNOSIS — J449 Chronic obstructive pulmonary disease, unspecified: Secondary | ICD-10-CM | POA: Diagnosis not present

## 2015-07-27 DIAGNOSIS — I272 Other secondary pulmonary hypertension: Secondary | ICD-10-CM | POA: Diagnosis not present

## 2015-07-27 DIAGNOSIS — M519 Unspecified thoracic, thoracolumbar and lumbosacral intervertebral disc disorder: Secondary | ICD-10-CM | POA: Diagnosis not present

## 2015-07-27 DIAGNOSIS — I69354 Hemiplegia and hemiparesis following cerebral infarction affecting left non-dominant side: Secondary | ICD-10-CM | POA: Diagnosis not present

## 2015-07-27 DIAGNOSIS — I2699 Other pulmonary embolism without acute cor pulmonale: Secondary | ICD-10-CM | POA: Diagnosis not present

## 2015-07-27 DIAGNOSIS — I480 Paroxysmal atrial fibrillation: Secondary | ICD-10-CM | POA: Diagnosis not present

## 2015-07-28 DIAGNOSIS — J40 Bronchitis, not specified as acute or chronic: Secondary | ICD-10-CM | POA: Diagnosis not present

## 2015-07-31 DIAGNOSIS — Z7901 Long term (current) use of anticoagulants: Secondary | ICD-10-CM | POA: Diagnosis not present

## 2015-07-31 DIAGNOSIS — I2699 Other pulmonary embolism without acute cor pulmonale: Secondary | ICD-10-CM | POA: Diagnosis not present

## 2015-08-01 DIAGNOSIS — I10 Essential (primary) hypertension: Secondary | ICD-10-CM | POA: Diagnosis not present

## 2015-08-01 DIAGNOSIS — M519 Unspecified thoracic, thoracolumbar and lumbosacral intervertebral disc disorder: Secondary | ICD-10-CM | POA: Diagnosis not present

## 2015-08-01 DIAGNOSIS — Z7984 Long term (current) use of oral hypoglycemic drugs: Secondary | ICD-10-CM | POA: Diagnosis not present

## 2015-08-01 DIAGNOSIS — I69354 Hemiplegia and hemiparesis following cerebral infarction affecting left non-dominant side: Secondary | ICD-10-CM | POA: Diagnosis not present

## 2015-08-01 DIAGNOSIS — I2699 Other pulmonary embolism without acute cor pulmonale: Secondary | ICD-10-CM | POA: Diagnosis not present

## 2015-08-01 DIAGNOSIS — I272 Other secondary pulmonary hypertension: Secondary | ICD-10-CM | POA: Diagnosis not present

## 2015-08-01 DIAGNOSIS — I480 Paroxysmal atrial fibrillation: Secondary | ICD-10-CM | POA: Diagnosis not present

## 2015-08-01 DIAGNOSIS — E1140 Type 2 diabetes mellitus with diabetic neuropathy, unspecified: Secondary | ICD-10-CM | POA: Diagnosis not present

## 2015-08-01 DIAGNOSIS — E785 Hyperlipidemia, unspecified: Secondary | ICD-10-CM | POA: Diagnosis not present

## 2015-08-01 DIAGNOSIS — Z7901 Long term (current) use of anticoagulants: Secondary | ICD-10-CM | POA: Diagnosis not present

## 2015-08-01 DIAGNOSIS — J449 Chronic obstructive pulmonary disease, unspecified: Secondary | ICD-10-CM | POA: Diagnosis not present

## 2015-08-01 DIAGNOSIS — Z79891 Long term (current) use of opiate analgesic: Secondary | ICD-10-CM | POA: Diagnosis not present

## 2015-08-02 DIAGNOSIS — I2699 Other pulmonary embolism without acute cor pulmonale: Secondary | ICD-10-CM | POA: Diagnosis not present

## 2015-08-03 DIAGNOSIS — I272 Other secondary pulmonary hypertension: Secondary | ICD-10-CM | POA: Diagnosis not present

## 2015-08-03 DIAGNOSIS — I10 Essential (primary) hypertension: Secondary | ICD-10-CM | POA: Diagnosis not present

## 2015-08-03 DIAGNOSIS — E1140 Type 2 diabetes mellitus with diabetic neuropathy, unspecified: Secondary | ICD-10-CM | POA: Diagnosis not present

## 2015-08-03 DIAGNOSIS — I69354 Hemiplegia and hemiparesis following cerebral infarction affecting left non-dominant side: Secondary | ICD-10-CM | POA: Diagnosis not present

## 2015-08-03 DIAGNOSIS — I480 Paroxysmal atrial fibrillation: Secondary | ICD-10-CM | POA: Diagnosis not present

## 2015-08-03 DIAGNOSIS — E785 Hyperlipidemia, unspecified: Secondary | ICD-10-CM | POA: Diagnosis not present

## 2015-08-03 DIAGNOSIS — M519 Unspecified thoracic, thoracolumbar and lumbosacral intervertebral disc disorder: Secondary | ICD-10-CM | POA: Diagnosis not present

## 2015-08-03 DIAGNOSIS — Z79891 Long term (current) use of opiate analgesic: Secondary | ICD-10-CM | POA: Diagnosis not present

## 2015-08-03 DIAGNOSIS — Z7901 Long term (current) use of anticoagulants: Secondary | ICD-10-CM | POA: Diagnosis not present

## 2015-08-03 DIAGNOSIS — J449 Chronic obstructive pulmonary disease, unspecified: Secondary | ICD-10-CM | POA: Diagnosis not present

## 2015-08-03 DIAGNOSIS — I2699 Other pulmonary embolism without acute cor pulmonale: Secondary | ICD-10-CM | POA: Diagnosis not present

## 2015-08-03 DIAGNOSIS — Z7984 Long term (current) use of oral hypoglycemic drugs: Secondary | ICD-10-CM | POA: Diagnosis not present

## 2015-08-08 DIAGNOSIS — I272 Other secondary pulmonary hypertension: Secondary | ICD-10-CM | POA: Diagnosis not present

## 2015-08-08 DIAGNOSIS — I10 Essential (primary) hypertension: Secondary | ICD-10-CM | POA: Diagnosis not present

## 2015-08-08 DIAGNOSIS — I480 Paroxysmal atrial fibrillation: Secondary | ICD-10-CM | POA: Diagnosis not present

## 2015-08-08 DIAGNOSIS — I2699 Other pulmonary embolism without acute cor pulmonale: Secondary | ICD-10-CM | POA: Diagnosis not present

## 2015-08-08 DIAGNOSIS — E1140 Type 2 diabetes mellitus with diabetic neuropathy, unspecified: Secondary | ICD-10-CM | POA: Diagnosis not present

## 2015-08-08 DIAGNOSIS — M519 Unspecified thoracic, thoracolumbar and lumbosacral intervertebral disc disorder: Secondary | ICD-10-CM | POA: Diagnosis not present

## 2015-08-08 DIAGNOSIS — E785 Hyperlipidemia, unspecified: Secondary | ICD-10-CM | POA: Diagnosis not present

## 2015-08-08 DIAGNOSIS — I69354 Hemiplegia and hemiparesis following cerebral infarction affecting left non-dominant side: Secondary | ICD-10-CM | POA: Diagnosis not present

## 2015-08-08 DIAGNOSIS — Z7984 Long term (current) use of oral hypoglycemic drugs: Secondary | ICD-10-CM | POA: Diagnosis not present

## 2015-08-08 DIAGNOSIS — Z79891 Long term (current) use of opiate analgesic: Secondary | ICD-10-CM | POA: Diagnosis not present

## 2015-08-08 DIAGNOSIS — Z7901 Long term (current) use of anticoagulants: Secondary | ICD-10-CM | POA: Diagnosis not present

## 2015-08-08 DIAGNOSIS — J449 Chronic obstructive pulmonary disease, unspecified: Secondary | ICD-10-CM | POA: Diagnosis not present

## 2015-08-09 DIAGNOSIS — E119 Type 2 diabetes mellitus without complications: Secondary | ICD-10-CM | POA: Diagnosis not present

## 2015-08-09 DIAGNOSIS — I480 Paroxysmal atrial fibrillation: Secondary | ICD-10-CM | POA: Diagnosis not present

## 2015-08-09 DIAGNOSIS — I10 Essential (primary) hypertension: Secondary | ICD-10-CM | POA: Diagnosis not present

## 2015-08-10 DIAGNOSIS — I272 Other secondary pulmonary hypertension: Secondary | ICD-10-CM | POA: Diagnosis not present

## 2015-08-10 DIAGNOSIS — E1140 Type 2 diabetes mellitus with diabetic neuropathy, unspecified: Secondary | ICD-10-CM | POA: Diagnosis not present

## 2015-08-10 DIAGNOSIS — E785 Hyperlipidemia, unspecified: Secondary | ICD-10-CM | POA: Diagnosis not present

## 2015-08-10 DIAGNOSIS — J449 Chronic obstructive pulmonary disease, unspecified: Secondary | ICD-10-CM | POA: Diagnosis not present

## 2015-08-10 DIAGNOSIS — Z7901 Long term (current) use of anticoagulants: Secondary | ICD-10-CM | POA: Diagnosis not present

## 2015-08-10 DIAGNOSIS — M519 Unspecified thoracic, thoracolumbar and lumbosacral intervertebral disc disorder: Secondary | ICD-10-CM | POA: Diagnosis not present

## 2015-08-10 DIAGNOSIS — I69354 Hemiplegia and hemiparesis following cerebral infarction affecting left non-dominant side: Secondary | ICD-10-CM | POA: Diagnosis not present

## 2015-08-10 DIAGNOSIS — Z79891 Long term (current) use of opiate analgesic: Secondary | ICD-10-CM | POA: Diagnosis not present

## 2015-08-10 DIAGNOSIS — I10 Essential (primary) hypertension: Secondary | ICD-10-CM | POA: Diagnosis not present

## 2015-08-10 DIAGNOSIS — I2699 Other pulmonary embolism without acute cor pulmonale: Secondary | ICD-10-CM | POA: Diagnosis not present

## 2015-08-10 DIAGNOSIS — Z7984 Long term (current) use of oral hypoglycemic drugs: Secondary | ICD-10-CM | POA: Diagnosis not present

## 2015-08-10 DIAGNOSIS — I480 Paroxysmal atrial fibrillation: Secondary | ICD-10-CM | POA: Diagnosis not present

## 2015-08-14 DIAGNOSIS — I10 Essential (primary) hypertension: Secondary | ICD-10-CM | POA: Diagnosis not present

## 2015-08-14 DIAGNOSIS — M519 Unspecified thoracic, thoracolumbar and lumbosacral intervertebral disc disorder: Secondary | ICD-10-CM | POA: Diagnosis not present

## 2015-08-14 DIAGNOSIS — Z7984 Long term (current) use of oral hypoglycemic drugs: Secondary | ICD-10-CM | POA: Diagnosis not present

## 2015-08-14 DIAGNOSIS — E785 Hyperlipidemia, unspecified: Secondary | ICD-10-CM | POA: Diagnosis not present

## 2015-08-14 DIAGNOSIS — E1140 Type 2 diabetes mellitus with diabetic neuropathy, unspecified: Secondary | ICD-10-CM | POA: Diagnosis not present

## 2015-08-14 DIAGNOSIS — I69354 Hemiplegia and hemiparesis following cerebral infarction affecting left non-dominant side: Secondary | ICD-10-CM | POA: Diagnosis not present

## 2015-08-14 DIAGNOSIS — J449 Chronic obstructive pulmonary disease, unspecified: Secondary | ICD-10-CM | POA: Diagnosis not present

## 2015-08-14 DIAGNOSIS — Z7901 Long term (current) use of anticoagulants: Secondary | ICD-10-CM | POA: Diagnosis not present

## 2015-08-14 DIAGNOSIS — Z79891 Long term (current) use of opiate analgesic: Secondary | ICD-10-CM | POA: Diagnosis not present

## 2015-08-14 DIAGNOSIS — I272 Other secondary pulmonary hypertension: Secondary | ICD-10-CM | POA: Diagnosis not present

## 2015-08-14 DIAGNOSIS — I480 Paroxysmal atrial fibrillation: Secondary | ICD-10-CM | POA: Diagnosis not present

## 2015-08-14 DIAGNOSIS — I2699 Other pulmonary embolism without acute cor pulmonale: Secondary | ICD-10-CM | POA: Diagnosis not present

## 2015-08-15 DIAGNOSIS — Z7984 Long term (current) use of oral hypoglycemic drugs: Secondary | ICD-10-CM | POA: Diagnosis not present

## 2015-08-15 DIAGNOSIS — I69354 Hemiplegia and hemiparesis following cerebral infarction affecting left non-dominant side: Secondary | ICD-10-CM | POA: Diagnosis not present

## 2015-08-15 DIAGNOSIS — E1140 Type 2 diabetes mellitus with diabetic neuropathy, unspecified: Secondary | ICD-10-CM | POA: Diagnosis not present

## 2015-08-15 DIAGNOSIS — I272 Other secondary pulmonary hypertension: Secondary | ICD-10-CM | POA: Diagnosis not present

## 2015-08-15 DIAGNOSIS — I2699 Other pulmonary embolism without acute cor pulmonale: Secondary | ICD-10-CM | POA: Diagnosis not present

## 2015-08-15 DIAGNOSIS — I10 Essential (primary) hypertension: Secondary | ICD-10-CM | POA: Diagnosis not present

## 2015-08-15 DIAGNOSIS — I480 Paroxysmal atrial fibrillation: Secondary | ICD-10-CM | POA: Diagnosis not present

## 2015-08-15 DIAGNOSIS — Z7901 Long term (current) use of anticoagulants: Secondary | ICD-10-CM | POA: Diagnosis not present

## 2015-08-15 DIAGNOSIS — M519 Unspecified thoracic, thoracolumbar and lumbosacral intervertebral disc disorder: Secondary | ICD-10-CM | POA: Diagnosis not present

## 2015-08-15 DIAGNOSIS — Z79891 Long term (current) use of opiate analgesic: Secondary | ICD-10-CM | POA: Diagnosis not present

## 2015-08-15 DIAGNOSIS — J449 Chronic obstructive pulmonary disease, unspecified: Secondary | ICD-10-CM | POA: Diagnosis not present

## 2015-08-15 DIAGNOSIS — E785 Hyperlipidemia, unspecified: Secondary | ICD-10-CM | POA: Diagnosis not present

## 2015-08-22 DIAGNOSIS — Z7984 Long term (current) use of oral hypoglycemic drugs: Secondary | ICD-10-CM | POA: Diagnosis not present

## 2015-08-22 DIAGNOSIS — I10 Essential (primary) hypertension: Secondary | ICD-10-CM | POA: Diagnosis not present

## 2015-08-22 DIAGNOSIS — J449 Chronic obstructive pulmonary disease, unspecified: Secondary | ICD-10-CM | POA: Diagnosis not present

## 2015-08-22 DIAGNOSIS — Z79891 Long term (current) use of opiate analgesic: Secondary | ICD-10-CM | POA: Diagnosis not present

## 2015-08-22 DIAGNOSIS — Z7901 Long term (current) use of anticoagulants: Secondary | ICD-10-CM | POA: Diagnosis not present

## 2015-08-22 DIAGNOSIS — I2699 Other pulmonary embolism without acute cor pulmonale: Secondary | ICD-10-CM | POA: Diagnosis not present

## 2015-08-22 DIAGNOSIS — I480 Paroxysmal atrial fibrillation: Secondary | ICD-10-CM | POA: Diagnosis not present

## 2015-08-22 DIAGNOSIS — E1140 Type 2 diabetes mellitus with diabetic neuropathy, unspecified: Secondary | ICD-10-CM | POA: Diagnosis not present

## 2015-08-22 DIAGNOSIS — M519 Unspecified thoracic, thoracolumbar and lumbosacral intervertebral disc disorder: Secondary | ICD-10-CM | POA: Diagnosis not present

## 2015-08-22 DIAGNOSIS — E785 Hyperlipidemia, unspecified: Secondary | ICD-10-CM | POA: Diagnosis not present

## 2015-08-22 DIAGNOSIS — I272 Other secondary pulmonary hypertension: Secondary | ICD-10-CM | POA: Diagnosis not present

## 2015-08-22 DIAGNOSIS — I69354 Hemiplegia and hemiparesis following cerebral infarction affecting left non-dominant side: Secondary | ICD-10-CM | POA: Diagnosis not present

## 2015-08-23 DIAGNOSIS — M79609 Pain in unspecified limb: Secondary | ICD-10-CM | POA: Diagnosis not present

## 2015-08-23 DIAGNOSIS — L600 Ingrowing nail: Secondary | ICD-10-CM | POA: Diagnosis not present

## 2015-08-24 DIAGNOSIS — L02619 Cutaneous abscess of unspecified foot: Secondary | ICD-10-CM

## 2015-08-24 DIAGNOSIS — L03039 Cellulitis of unspecified toe: Secondary | ICD-10-CM

## 2015-08-24 HISTORY — DX: Cellulitis of unspecified toe: L03.039

## 2015-08-24 HISTORY — DX: Cellulitis of unspecified toe: L02.619

## 2015-08-28 DIAGNOSIS — I4891 Unspecified atrial fibrillation: Secondary | ICD-10-CM | POA: Diagnosis not present

## 2015-08-28 DIAGNOSIS — I10 Essential (primary) hypertension: Secondary | ICD-10-CM | POA: Diagnosis not present

## 2015-08-28 DIAGNOSIS — Z7984 Long term (current) use of oral hypoglycemic drugs: Secondary | ICD-10-CM | POA: Diagnosis not present

## 2015-08-28 DIAGNOSIS — M519 Unspecified thoracic, thoracolumbar and lumbosacral intervertebral disc disorder: Secondary | ICD-10-CM | POA: Diagnosis not present

## 2015-08-28 DIAGNOSIS — I480 Paroxysmal atrial fibrillation: Secondary | ICD-10-CM | POA: Diagnosis not present

## 2015-08-28 DIAGNOSIS — J449 Chronic obstructive pulmonary disease, unspecified: Secondary | ICD-10-CM | POA: Diagnosis not present

## 2015-08-28 DIAGNOSIS — Z7901 Long term (current) use of anticoagulants: Secondary | ICD-10-CM | POA: Diagnosis not present

## 2015-08-28 DIAGNOSIS — I2699 Other pulmonary embolism without acute cor pulmonale: Secondary | ICD-10-CM | POA: Diagnosis not present

## 2015-08-28 DIAGNOSIS — I272 Other secondary pulmonary hypertension: Secondary | ICD-10-CM | POA: Diagnosis not present

## 2015-08-28 DIAGNOSIS — E1140 Type 2 diabetes mellitus with diabetic neuropathy, unspecified: Secondary | ICD-10-CM | POA: Diagnosis not present

## 2015-08-28 DIAGNOSIS — Z79891 Long term (current) use of opiate analgesic: Secondary | ICD-10-CM | POA: Diagnosis not present

## 2015-08-28 DIAGNOSIS — L0390 Cellulitis, unspecified: Secondary | ICD-10-CM | POA: Diagnosis not present

## 2015-08-28 DIAGNOSIS — I69354 Hemiplegia and hemiparesis following cerebral infarction affecting left non-dominant side: Secondary | ICD-10-CM | POA: Diagnosis not present

## 2015-08-28 DIAGNOSIS — E785 Hyperlipidemia, unspecified: Secondary | ICD-10-CM | POA: Diagnosis not present

## 2015-08-30 DIAGNOSIS — T8189XA Other complications of procedures, not elsewhere classified, initial encounter: Secondary | ICD-10-CM | POA: Diagnosis not present

## 2015-08-31 DIAGNOSIS — E1140 Type 2 diabetes mellitus with diabetic neuropathy, unspecified: Secondary | ICD-10-CM | POA: Diagnosis not present

## 2015-08-31 DIAGNOSIS — Z7901 Long term (current) use of anticoagulants: Secondary | ICD-10-CM | POA: Diagnosis not present

## 2015-08-31 DIAGNOSIS — M519 Unspecified thoracic, thoracolumbar and lumbosacral intervertebral disc disorder: Secondary | ICD-10-CM | POA: Diagnosis not present

## 2015-08-31 DIAGNOSIS — I272 Other secondary pulmonary hypertension: Secondary | ICD-10-CM | POA: Diagnosis not present

## 2015-08-31 DIAGNOSIS — Z7984 Long term (current) use of oral hypoglycemic drugs: Secondary | ICD-10-CM | POA: Diagnosis not present

## 2015-08-31 DIAGNOSIS — I10 Essential (primary) hypertension: Secondary | ICD-10-CM | POA: Diagnosis not present

## 2015-08-31 DIAGNOSIS — I480 Paroxysmal atrial fibrillation: Secondary | ICD-10-CM | POA: Diagnosis not present

## 2015-08-31 DIAGNOSIS — I69354 Hemiplegia and hemiparesis following cerebral infarction affecting left non-dominant side: Secondary | ICD-10-CM | POA: Diagnosis not present

## 2015-08-31 DIAGNOSIS — I2699 Other pulmonary embolism without acute cor pulmonale: Secondary | ICD-10-CM | POA: Diagnosis not present

## 2015-08-31 DIAGNOSIS — Z79891 Long term (current) use of opiate analgesic: Secondary | ICD-10-CM | POA: Diagnosis not present

## 2015-08-31 DIAGNOSIS — J449 Chronic obstructive pulmonary disease, unspecified: Secondary | ICD-10-CM | POA: Diagnosis not present

## 2015-08-31 DIAGNOSIS — E785 Hyperlipidemia, unspecified: Secondary | ICD-10-CM | POA: Diagnosis not present

## 2015-09-04 ENCOUNTER — Telehealth: Payer: Self-pay | Admitting: Internal Medicine

## 2015-09-04 DIAGNOSIS — S99922A Unspecified injury of left foot, initial encounter: Secondary | ICD-10-CM | POA: Diagnosis not present

## 2015-09-04 DIAGNOSIS — L0390 Cellulitis, unspecified: Secondary | ICD-10-CM | POA: Diagnosis not present

## 2015-09-04 NOTE — Telephone Encounter (Signed)
ATC, NA - WCB 

## 2015-09-04 NOTE — Telephone Encounter (Signed)
He saw TP 07/21/15 and cxr showed persistent opacity suggestive of mass. He was supposed to fu with me per her OV but he never did. His last CT was 06/23/15. Pls give him appt for CT chest wo contrast end dec 2016 / early Jan 2017 and have huim see TP again; my schedule full  Thanks  Dr. Brand Males, M.D., [facility].C.P Pulmonary and Critical Care Medicine Staff Physician [facility] Pager: [phone], If no answer or between  15:00h - 7:00h: call [phone]  09/04/2015 5:40 AM

## 2015-09-05 DIAGNOSIS — I10 Essential (primary) hypertension: Secondary | ICD-10-CM | POA: Diagnosis not present

## 2015-09-05 DIAGNOSIS — Z7984 Long term (current) use of oral hypoglycemic drugs: Secondary | ICD-10-CM | POA: Diagnosis not present

## 2015-09-05 DIAGNOSIS — Z7901 Long term (current) use of anticoagulants: Secondary | ICD-10-CM | POA: Diagnosis not present

## 2015-09-05 DIAGNOSIS — I69354 Hemiplegia and hemiparesis following cerebral infarction affecting left non-dominant side: Secondary | ICD-10-CM | POA: Diagnosis not present

## 2015-09-05 DIAGNOSIS — E1140 Type 2 diabetes mellitus with diabetic neuropathy, unspecified: Secondary | ICD-10-CM | POA: Diagnosis not present

## 2015-09-05 DIAGNOSIS — I2699 Other pulmonary embolism without acute cor pulmonale: Secondary | ICD-10-CM | POA: Diagnosis not present

## 2015-09-05 DIAGNOSIS — E785 Hyperlipidemia, unspecified: Secondary | ICD-10-CM | POA: Diagnosis not present

## 2015-09-05 DIAGNOSIS — I272 Other secondary pulmonary hypertension: Secondary | ICD-10-CM | POA: Diagnosis not present

## 2015-09-05 DIAGNOSIS — J449 Chronic obstructive pulmonary disease, unspecified: Secondary | ICD-10-CM | POA: Diagnosis not present

## 2015-09-05 DIAGNOSIS — M519 Unspecified thoracic, thoracolumbar and lumbosacral intervertebral disc disorder: Secondary | ICD-10-CM | POA: Diagnosis not present

## 2015-09-05 DIAGNOSIS — I480 Paroxysmal atrial fibrillation: Secondary | ICD-10-CM | POA: Diagnosis not present

## 2015-09-05 DIAGNOSIS — Z79891 Long term (current) use of opiate analgesic: Secondary | ICD-10-CM | POA: Diagnosis not present

## 2015-09-05 NOTE — Telephone Encounter (Signed)
atc pt X2, no answer, no vm.  wcb

## 2015-09-06 DIAGNOSIS — L0390 Cellulitis, unspecified: Secondary | ICD-10-CM | POA: Diagnosis not present

## 2015-09-06 DIAGNOSIS — D229 Melanocytic nevi, unspecified: Secondary | ICD-10-CM | POA: Diagnosis not present

## 2015-09-07 NOTE — Telephone Encounter (Signed)
ATC pt, na, no vm wcb

## 2015-09-08 DIAGNOSIS — D2339 Other benign neoplasm of skin of other parts of face: Secondary | ICD-10-CM | POA: Diagnosis not present

## 2015-09-08 DIAGNOSIS — Q859 Phakomatosis, unspecified: Secondary | ICD-10-CM | POA: Diagnosis not present

## 2015-09-08 NOTE — Telephone Encounter (Signed)
Attempted to call. No answer.

## 2015-09-11 ENCOUNTER — Inpatient Hospital Stay (HOSPITAL_COMMUNITY)
Admission: EM | Admit: 2015-09-11 | Discharge: 2015-09-14 | DRG: 907 | Disposition: A | Discharge status: Home Health | Payer: Medicare Other | Attending: Internal Medicine | Admitting: Internal Medicine

## 2015-09-11 ENCOUNTER — Inpatient Hospital Stay (HOSPITAL_COMMUNITY): Payer: Medicare Other

## 2015-09-11 ENCOUNTER — Encounter (HOSPITAL_COMMUNITY): Payer: Self-pay | Admitting: Family Medicine

## 2015-09-11 ENCOUNTER — Emergency Department (HOSPITAL_COMMUNITY): Payer: Medicare Other

## 2015-09-11 DIAGNOSIS — M109 Gout, unspecified: Secondary | ICD-10-CM | POA: Diagnosis present

## 2015-09-11 DIAGNOSIS — D229 Melanocytic nevi, unspecified: Secondary | ICD-10-CM | POA: Diagnosis not present

## 2015-09-11 DIAGNOSIS — I2782 Chronic pulmonary embolism: Secondary | ICD-10-CM | POA: Diagnosis present

## 2015-09-11 DIAGNOSIS — L03116 Cellulitis of left lower limb: Secondary | ICD-10-CM | POA: Diagnosis not present

## 2015-09-11 DIAGNOSIS — K219 Gastro-esophageal reflux disease without esophagitis: Secondary | ICD-10-CM | POA: Diagnosis not present

## 2015-09-11 DIAGNOSIS — M869 Osteomyelitis, unspecified: Secondary | ICD-10-CM | POA: Diagnosis not present

## 2015-09-11 DIAGNOSIS — Z87891 Personal history of nicotine dependence: Secondary | ICD-10-CM | POA: Diagnosis not present

## 2015-09-11 DIAGNOSIS — M79672 Pain in left foot: Secondary | ICD-10-CM | POA: Diagnosis not present

## 2015-09-11 DIAGNOSIS — Y838 Other surgical procedures as the cause of abnormal reaction of the patient, or of later complication, without mention of misadventure at the time of the procedure: Secondary | ICD-10-CM | POA: Diagnosis present

## 2015-09-11 DIAGNOSIS — R0902 Hypoxemia: Secondary | ICD-10-CM | POA: Insufficient documentation

## 2015-09-11 DIAGNOSIS — Z7984 Long term (current) use of oral hypoglycemic drugs: Secondary | ICD-10-CM

## 2015-09-11 DIAGNOSIS — L089 Local infection of the skin and subcutaneous tissue, unspecified: Secondary | ICD-10-CM | POA: Diagnosis present

## 2015-09-11 DIAGNOSIS — Z7951 Long term (current) use of inhaled steroids: Secondary | ICD-10-CM

## 2015-09-11 DIAGNOSIS — I2699 Other pulmonary embolism without acute cor pulmonale: Secondary | ICD-10-CM | POA: Diagnosis present

## 2015-09-11 DIAGNOSIS — Z8673 Personal history of transient ischemic attack (TIA), and cerebral infarction without residual deficits: Secondary | ICD-10-CM | POA: Diagnosis not present

## 2015-09-11 DIAGNOSIS — L0390 Cellulitis, unspecified: Secondary | ICD-10-CM | POA: Diagnosis not present

## 2015-09-11 DIAGNOSIS — H3530 Unspecified macular degeneration: Secondary | ICD-10-CM | POA: Diagnosis not present

## 2015-09-11 DIAGNOSIS — Z8249 Family history of ischemic heart disease and other diseases of the circulatory system: Secondary | ICD-10-CM

## 2015-09-11 DIAGNOSIS — E1152 Type 2 diabetes mellitus with diabetic peripheral angiopathy with gangrene: Secondary | ICD-10-CM | POA: Diagnosis present

## 2015-09-11 DIAGNOSIS — J449 Chronic obstructive pulmonary disease, unspecified: Secondary | ICD-10-CM | POA: Diagnosis not present

## 2015-09-11 DIAGNOSIS — E785 Hyperlipidemia, unspecified: Secondary | ICD-10-CM | POA: Diagnosis not present

## 2015-09-11 DIAGNOSIS — F0150 Vascular dementia without behavioral disturbance: Secondary | ICD-10-CM | POA: Diagnosis not present

## 2015-09-11 DIAGNOSIS — R791 Abnormal coagulation profile: Secondary | ICD-10-CM | POA: Diagnosis present

## 2015-09-11 DIAGNOSIS — J9621 Acute and chronic respiratory failure with hypoxia: Secondary | ICD-10-CM | POA: Diagnosis not present

## 2015-09-11 DIAGNOSIS — R609 Edema, unspecified: Secondary | ICD-10-CM

## 2015-09-11 DIAGNOSIS — I10 Essential (primary) hypertension: Secondary | ICD-10-CM | POA: Diagnosis present

## 2015-09-11 DIAGNOSIS — D649 Anemia, unspecified: Secondary | ICD-10-CM | POA: Diagnosis not present

## 2015-09-11 DIAGNOSIS — I739 Peripheral vascular disease, unspecified: Secondary | ICD-10-CM | POA: Diagnosis present

## 2015-09-11 DIAGNOSIS — L03032 Cellulitis of left toe: Secondary | ICD-10-CM | POA: Diagnosis present

## 2015-09-11 DIAGNOSIS — M9689 Other intraoperative and postprocedural complications and disorders of the musculoskeletal system: Principal | ICD-10-CM | POA: Diagnosis present

## 2015-09-11 DIAGNOSIS — E1169 Type 2 diabetes mellitus with other specified complication: Secondary | ICD-10-CM | POA: Diagnosis present

## 2015-09-11 DIAGNOSIS — M7989 Other specified soft tissue disorders: Secondary | ICD-10-CM | POA: Diagnosis not present

## 2015-09-11 DIAGNOSIS — Z9981 Dependence on supplemental oxygen: Secondary | ICD-10-CM | POA: Diagnosis not present

## 2015-09-11 DIAGNOSIS — R0602 Shortness of breath: Secondary | ICD-10-CM

## 2015-09-11 DIAGNOSIS — M86172 Other acute osteomyelitis, left ankle and foot: Secondary | ICD-10-CM | POA: Diagnosis not present

## 2015-09-11 DIAGNOSIS — I4891 Unspecified atrial fibrillation: Secondary | ICD-10-CM | POA: Diagnosis not present

## 2015-09-11 DIAGNOSIS — Z79899 Other long term (current) drug therapy: Secondary | ICD-10-CM

## 2015-09-11 DIAGNOSIS — J9601 Acute respiratory failure with hypoxia: Secondary | ICD-10-CM | POA: Diagnosis not present

## 2015-09-11 DIAGNOSIS — I2510 Atherosclerotic heart disease of native coronary artery without angina pectoris: Secondary | ICD-10-CM | POA: Diagnosis not present

## 2015-09-11 DIAGNOSIS — I482 Chronic atrial fibrillation: Secondary | ICD-10-CM | POA: Diagnosis not present

## 2015-09-11 DIAGNOSIS — M79609 Pain in unspecified limb: Secondary | ICD-10-CM | POA: Diagnosis not present

## 2015-09-11 DIAGNOSIS — Z7901 Long term (current) use of anticoagulants: Secondary | ICD-10-CM

## 2015-09-11 DIAGNOSIS — L97529 Non-pressure chronic ulcer of other part of left foot with unspecified severity: Secondary | ICD-10-CM | POA: Diagnosis not present

## 2015-09-11 HISTORY — DX: Cellulitis of unspecified toe: L03.039

## 2015-09-11 HISTORY — DX: Cutaneous abscess of unspecified foot: L02.619

## 2015-09-11 LAB — COMPREHENSIVE METABOLIC PANEL
ALBUMIN: 3.5 g/dL (ref 3.5–5.0)
ALK PHOS: 73 U/L (ref 38–126)
ALT: 14 U/L — AB (ref 17–63)
AST: 32 U/L (ref 15–41)
Anion gap: 9 (ref 5–15)
BILIRUBIN TOTAL: 1.2 mg/dL (ref 0.3–1.2)
BUN: 34 mg/dL — AB (ref 6–20)
CALCIUM: 9.4 mg/dL (ref 8.9–10.3)
CO2: 26 mmol/L (ref 22–32)
CREATININE: 0.89 mg/dL (ref 0.61–1.24)
Chloride: 103 mmol/L (ref 101–111)
GFR calc Af Amer: >60 mL/min (ref >60)
GLUCOSE: 101 mg/dL — AB (ref 65–99)
Potassium: 5.2 mmol/L — ABNORMAL HIGH (ref 3.5–5.1)
Sodium: 138 mmol/L (ref 135–145)
TOTAL PROTEIN: 7.9 g/dL (ref 6.5–8.1)

## 2015-09-11 LAB — PROTIME-INR
INR: 1.75 — ABNORMAL HIGH (ref 0.00–1.49)
Prothrombin Time: 20.4 seconds — ABNORMAL HIGH (ref 11.6–15.2)

## 2015-09-11 LAB — CBC WITH DIFFERENTIAL/PLATELET
BASOS ABS: 0 10*3/uL (ref 0.0–0.1)
BASOS PCT: 0 %
EOS PCT: 3 %
Eosinophils Absolute: 0.2 10*3/uL (ref 0.0–0.7)
HEMATOCRIT: 38.4 % — AB (ref 39.0–52.0)
HEMOGLOBIN: 13 g/dL (ref 13.0–17.0)
LYMPHS ABS: 1.5 10*3/uL (ref 0.7–4.0)
LYMPHS PCT: 23 %
MCH: 33.5 pg (ref 26.0–34.0)
MCHC: 33.9 g/dL (ref 30.0–36.0)
MCV: 99 fL (ref 78.0–100.0)
MONO ABS: 1.2 10*3/uL — AB (ref 0.1–1.0)
MONOS PCT: 18 %
NEUTROS ABS: 3.8 10*3/uL (ref 1.7–7.7)
Neutrophils Relative %: 56 %
Platelets: 239 10*3/uL (ref 150–400)
RBC: 3.88 MIL/uL — ABNORMAL LOW (ref 4.22–5.81)
RDW: 13.5 % (ref 11.5–15.5)
WBC: 6.7 10*3/uL (ref 4.0–10.5)

## 2015-09-11 LAB — LACTIC ACID, PLASMA: Lactic Acid, Venous: 2.1 mmol/L (ref 0.5–2.0)

## 2015-09-11 LAB — TROPONIN I

## 2015-09-11 LAB — I-STAT CG4 LACTIC ACID, ED: LACTIC ACID, VENOUS: 1.82 mmol/L (ref 0.5–2.0)

## 2015-09-11 MED ORDER — PANTOPRAZOLE SODIUM 40 MG PO TBEC
40.0000 mg | DELAYED_RELEASE_TABLET | Freq: Two times a day (BID) | ORAL | Status: DC
  Administered 2015-09-11 – 2015-09-14 (×5): 40 mg via ORAL
  Filled 2015-09-11 (×5): qty 1

## 2015-09-11 MED ORDER — VANCOMYCIN HCL 10 G IV SOLR
1250.0000 mg | INTRAVENOUS | Status: DC
  Administered 2015-09-12: 1250 mg via INTRAVENOUS
  Filled 2015-09-11 (×2): qty 1250

## 2015-09-11 MED ORDER — ALBUTEROL SULFATE (2.5 MG/3ML) 0.083% IN NEBU
2.5000 mg | INHALATION_SOLUTION | Freq: Four times a day (QID) | RESPIRATORY_TRACT | Status: DC
  Administered 2015-09-12 – 2015-09-14 (×9): 2.5 mg via RESPIRATORY_TRACT
  Filled 2015-09-11 (×10): qty 3

## 2015-09-11 MED ORDER — IOHEXOL 350 MG/ML SOLN
100.0000 mL | Freq: Once | INTRAVENOUS | Status: AC | PRN
  Administered 2015-09-11: 80 mL via INTRAVENOUS

## 2015-09-11 MED ORDER — FAMOTIDINE 20 MG PO TABS
20.0000 mg | ORAL_TABLET | Freq: Two times a day (BID) | ORAL | Status: DC
  Administered 2015-09-11 – 2015-09-14 (×5): 20 mg via ORAL
  Filled 2015-09-11 (×5): qty 1

## 2015-09-11 MED ORDER — HEPARIN (PORCINE) IN NACL 100-0.45 UNIT/ML-% IJ SOLN
1100.0000 [IU]/h | INTRAMUSCULAR | Status: DC
  Administered 2015-09-12: 850 [IU]/h via INTRAVENOUS
  Filled 2015-09-11: qty 250

## 2015-09-11 MED ORDER — TRAMADOL HCL 50 MG PO TABS
50.0000 mg | ORAL_TABLET | Freq: Four times a day (QID) | ORAL | Status: DC | PRN
  Administered 2015-09-13 – 2015-09-14 (×3): 50 mg via ORAL
  Filled 2015-09-11 (×4): qty 1

## 2015-09-11 MED ORDER — SODIUM CHLORIDE 0.9 % IJ SOLN
3.0000 mL | Freq: Two times a day (BID) | INTRAMUSCULAR | Status: DC
  Administered 2015-09-12 – 2015-09-13 (×3): 3 mL via INTRAVENOUS

## 2015-09-11 MED ORDER — ALLOPURINOL 100 MG PO TABS
100.0000 mg | ORAL_TABLET | Freq: Two times a day (BID) | ORAL | Status: DC
  Administered 2015-09-11 – 2015-09-14 (×5): 100 mg via ORAL
  Filled 2015-09-11 (×5): qty 1

## 2015-09-11 MED ORDER — PIPERACILLIN-TAZOBACTAM 3.375 G IVPB
3.3750 g | Freq: Three times a day (TID) | INTRAVENOUS | Status: DC
  Administered 2015-09-12 – 2015-09-13 (×5): 3.375 g via INTRAVENOUS
  Filled 2015-09-11 (×8): qty 50

## 2015-09-11 MED ORDER — INSULIN ASPART 100 UNIT/ML ~~LOC~~ SOLN
0.0000 [IU] | Freq: Three times a day (TID) | SUBCUTANEOUS | Status: DC
  Administered 2015-09-13 (×2): 1 [IU] via SUBCUTANEOUS

## 2015-09-11 MED ORDER — SIMVASTATIN 40 MG PO TABS
40.0000 mg | ORAL_TABLET | Freq: Every evening | ORAL | Status: DC
  Administered 2015-09-12 – 2015-09-13 (×2): 40 mg via ORAL
  Filled 2015-09-11 (×2): qty 1

## 2015-09-11 MED ORDER — MAGNESIUM OXIDE 400 (241.3 MG) MG PO TABS
200.0000 mg | ORAL_TABLET | Freq: Every day | ORAL | Status: DC
  Administered 2015-09-12 – 2015-09-14 (×3): 200 mg via ORAL
  Filled 2015-09-11 (×3): qty 1

## 2015-09-11 MED ORDER — ACETAMINOPHEN 650 MG RE SUPP: 650.0000 mg | Freq: Four times a day (QID) | RECTAL | Status: DC | PRN

## 2015-09-11 MED ORDER — ONDANSETRON HCL 4 MG PO TABS: 4.0000 mg | ORAL_TABLET | Freq: Four times a day (QID) | ORAL | Status: DC | PRN

## 2015-09-11 MED ORDER — MELATONIN 3 MG PO TABS: 3.0000 mg | ORAL_TABLET | Freq: Every evening | ORAL | Status: DC | PRN

## 2015-09-11 MED ORDER — PIPERACILLIN-TAZOBACTAM 4.5 G IVPB: 4.5000 g | Freq: Once | INTRAVENOUS | Status: DC

## 2015-09-11 MED ORDER — MORPHINE SULFATE (PF) 2 MG/ML IV SOLN
0.5000 mg | INTRAVENOUS | Status: DC | PRN
  Administered 2015-09-13 – 2015-09-14 (×2): 0.5 mg via INTRAVENOUS
  Filled 2015-09-11 (×2): qty 1

## 2015-09-11 MED ORDER — VITAMIN B-12 1000 MCG PO TABS
1000.0000 ug | ORAL_TABLET | Freq: Every day | ORAL | Status: DC
  Administered 2015-09-12 – 2015-09-14 (×3): 1000 ug via ORAL
  Filled 2015-09-11 (×3): qty 1

## 2015-09-11 MED ORDER — VANCOMYCIN HCL IN DEXTROSE 1-5 GM/200ML-% IV SOLN
1000.0000 mg | Freq: Once | INTRAVENOUS | Status: AC
  Administered 2015-09-11: 1000 mg via INTRAVENOUS
  Filled 2015-09-11: qty 200

## 2015-09-11 MED ORDER — MIRTAZAPINE 15 MG PO TABS
15.0000 mg | ORAL_TABLET | Freq: Every day | ORAL | Status: DC
  Administered 2015-09-11 – 2015-09-13 (×2): 15 mg via ORAL
  Filled 2015-09-11 (×2): qty 1

## 2015-09-11 MED ORDER — BUDESONIDE 0.5 MG/2ML IN SUSP: 0.5000 mg | Freq: Two times a day (BID) | RESPIRATORY_TRACT | Status: DC | PRN

## 2015-09-11 MED ORDER — CILOSTAZOL 50 MG PO TABS
50.0000 mg | ORAL_TABLET | Freq: Two times a day (BID) | ORAL | Status: DC
  Administered 2015-09-11 – 2015-09-14 (×5): 50 mg via ORAL
  Filled 2015-09-11 (×7): qty 1

## 2015-09-11 MED ORDER — ONDANSETRON HCL 4 MG/2ML IJ SOLN: 4.0000 mg | Freq: Four times a day (QID) | INTRAMUSCULAR | Status: DC | PRN

## 2015-09-11 MED ORDER — TIOTROPIUM BROMIDE MONOHYDRATE 18 MCG IN CAPS
18.0000 ug | ORAL_CAPSULE | Freq: Every day | RESPIRATORY_TRACT | Status: DC
  Administered 2015-09-12 – 2015-09-14 (×3): 18 ug via RESPIRATORY_TRACT
  Filled 2015-09-11: qty 5

## 2015-09-11 MED ORDER — PIPERACILLIN-TAZOBACTAM 3.375 G IVPB 30 MIN
3.3750 g | Freq: Once | INTRAVENOUS | Status: AC
  Administered 2015-09-11: 3.375 g via INTRAVENOUS
  Filled 2015-09-11: qty 50

## 2015-09-11 MED ORDER — CARVEDILOL 6.25 MG PO TABS
6.2500 mg | ORAL_TABLET | Freq: Two times a day (BID) | ORAL | Status: DC
  Administered 2015-09-12 – 2015-09-14 (×5): 6.25 mg via ORAL
  Filled 2015-09-11 (×5): qty 1

## 2015-09-11 MED ORDER — ACETAMINOPHEN 325 MG PO TABS: 650.0000 mg | ORAL_TABLET | Freq: Four times a day (QID) | ORAL | Status: DC | PRN

## 2015-09-11 MED ORDER — COLCHICINE 0.6 MG PO TABS
0.6000 mg | ORAL_TABLET | Freq: Two times a day (BID) | ORAL | Status: DC
  Administered 2015-09-12 – 2015-09-14 (×4): 0.6 mg via ORAL
  Filled 2015-09-11 (×4): qty 1

## 2015-09-11 NOTE — ED Notes (Signed)
WIFE PHONE # [phone]

## 2015-09-11 NOTE — ED Provider Notes (Signed)
CSN: [id number]     Arrival date & time 09/11/15  1511 History   First MD Initiated Contact with Patient 09/11/15 1639     Chief Complaint  Patient presents with  . Wound Infection     (Consider location/radiation/quality/duration/timing/severity/associated sxs/prior Treatment) HPI Comments:  Patient sent from PCPs office with drainage, redness and erythema to his left great toe for the past 2 days. States he had a toenail removed by a podiatrist about 3 weeks ago. States he was doing well until yesterday. He's been taking Augmentin for the past one week or so. He denies fever, chills, nausea or vomiting. He has a chronic cough at baseline from his COPD. He denies any difficulty breathing worse than baseline. No chest pain. He is on Coumadin for history of atrial fibrillation. He states the toe is sore and painful to walk on. There has been some serous drainage today.  The history is provided by the patient.    Past Medical History  Diagnosis Date  . Dysrhythmia     atrial fibrilation  . Hypertension   . Shortness of breath   . Claudication of calf muscles ([place]) 11/05/2012    right calf  . GERD (gastroesophageal reflux disease)   . TIA (transient ischemic attack) 2010  . Peripheral vascular disease ([place])   . On home oxygen therapy     "2L; w/activity" (02/24/2014)  . Neuromuscular disorder (HCC)     dizziness  . Macular degeneration, bilateral     "has had shots in both eyes"  . Coronary artery disease   . COPD (chronic obstructive pulmonary disease) ([place])   . Type II diabetes mellitus ([place])   . Stroke ([place]) 05/2013    "can't use left arm fully since"  . Arthritis     "legs" (02/24/2014)  . Pneumonia     "5 times back to back recently (just finished antibiiotic) " (02/24/2014)   Past Surgical History  Procedure Laterality Date  . Removal of pleural drainage catheter Left 05/03/2014    Procedure: REMOVAL OF PLEURAL DRAINAGE CATHETER;  Surgeon: Ivin Poot, MD;  Location: [facility];  Service: Thoracic;  Laterality: Left;  . Foot surgery Left     due to broken foot years ago  . Esophagogastroduodenoscopy N/A 11/02/2013    Procedure: ESOPHAGOGASTRODUODENOSCOPY (EGD);  Surgeon: Cleotis Nipper, MD;  Location: [facility] ENDOSCOPY;  Service: Endoscopy;  Laterality: N/A;  . Vascular surgery      stents to legs  . Cardiac catheterization    . Transurethral resection of prostate    . Femoral artery stent Left 10/2012  . Chest tube insertion Left 02/24/2014    Procedure: INSERTION PLEURAL DRAINAGE CATHETER;  Surgeon: Ivin Poot, MD;  Location: [facility];  Service: Thoracic;  Laterality: Left;  . Esophagogastroduodenoscopy (egd) with propofol N/A 07/06/2014    Procedure: ESOPHAGOGASTRODUODENOSCOPY (EGD) WITH PROPOFOL;  Surgeon: Cleotis Nipper, MD;  Location: [facility];  Service: Endoscopy;  Laterality: N/A;  . Lower extremity angiogram N/A 11/06/2012    Procedure: LOWER EXTREMITY ANGIOGRAM;  Surgeon: Laverda Page, MD;  Location: [facility] CATH LAB;  Service: Cardiovascular;  Laterality: N/A;   Family History  Problem Relation Age of Onset  . Cancer Neg Hx   . CAD Neg Hx   . Hypertension Other    Social History  Substance Use Topics  . Smoking status: Former Smoker -- 3.00 packs/day for 35 years    Types: Cigarettes    Quit date: 09/23/1981  .  Smokeless tobacco: Never Used  . Alcohol Use: Yes     Comment: QUITS YEARS AGO    Review of Systems  Constitutional: Positive for activity change, appetite change and fatigue. Negative for fever.  HENT: Negative for congestion.   Eyes: Negative for visual disturbance.  Respiratory: Positive for cough and shortness of breath. Negative for chest tightness.   Cardiovascular: Negative for chest pain.  Gastrointestinal: Negative for nausea, vomiting and abdominal pain.  Genitourinary: Negative for dysuria, hematuria and testicular pain.  Musculoskeletal: Negative for myalgias, back pain and arthralgias.  Skin: Positive for rash and  wound.  Neurological: Negative for dizziness, syncope, speech difficulty, weakness and headaches.  A complete 10 system review of systems was obtained and all systems are negative except as noted in the HPI and PMH.      Allergies  Review of patient's allergies indicates no known allergies.  Home Medications   Prior to Admission medications   Medication Sig Start Date End Date Taking? Authorizing Provider  albuterol (PROVENTIL) (2.5 MG/3ML) 0.083% nebulizer solution Take 2.5 mg by nebulization 4 (four) times daily.   Yes Historical Provider, MD  allopurinol (ZYLOPRIM) 100 MG tablet Take 100 mg by mouth 2 (two) times daily.  06/28/14  Yes Historical Provider, MD  amoxicillin-clavulanate (AUGMENTIN) 875-125 MG tablet Take 1 tablet by mouth 2 (two) times daily. 09/04/15  Yes Historical Provider, MD  budesonide (PULMICORT) 0.5 MG/2ML nebulizer solution Take 0.5 mg by nebulization 2 (two) times daily as needed (for wheezing/shortness of breath).    Yes Historical Provider, MD  carvedilol (COREG) 6.25 MG tablet Take 6.25 mg by mouth 2 (two) times daily with a meal.   Yes Historical Provider, MD  cilostazol (PLETAL) 50 MG tablet Take 50 mg by mouth 2 (two) times daily.   Yes Historical Provider, MD  colchicine 0.6 MG tablet Take 1 tablet (0.6 mg total) by mouth 2 (two) times daily. 04/21/14  Yes Junius Creamer, NP  magnesium oxide (MAG-OX) 400 MG tablet Take 200 mg by mouth daily.   Yes Historical Provider, MD  Melatonin 3 MG TABS Take 3 mg by mouth at bedtime as needed (sleep).    Yes Historical Provider, MD  metFORMIN (GLUCOPHAGE) 1000 MG tablet Take 500 mg by mouth 2 (two) times daily with a meal.   Yes Historical Provider, MD  mirtazapine (REMERON) 15 MG tablet Take 15 mg by mouth at bedtime.   Yes Historical Provider, MD  Multiple Vitamins-Minerals (OCUVITE PRESERVISION PO) Take 1 tablet by mouth daily.   Yes Historical Provider, MD  pantoprazole (PROTONIX) 40 MG tablet Take 1 tablet (40 mg total)  by mouth 2 (two) times daily. 07/22/14  Yes Charlynne Cousins, MD  ranitidine (ZANTAC) 300 MG tablet Take 300 mg by mouth at bedtime.   Yes Historical Provider, MD  simvastatin (ZOCOR) 40 MG tablet Take 40 mg by mouth every evening.   Yes Historical Provider, MD  tiotropium (SPIRIVA) 18 MCG inhalation capsule Place 18 mcg into inhaler and inhale daily.   Yes Historical Provider, MD  traMADol (ULTRAM) 50 MG tablet Take 1 tablet (50 mg total) by mouth every 6 (six) hours as needed for moderate pain. 07/25/14  Yes Tiffany L Reed, DO  vitamin B-12 (CYANOCOBALAMIN) 1000 MCG tablet Take 1,000 mcg by mouth daily.   Yes Historical Provider, MD  warfarin (COUMADIN) 5 MG tablet Take 1 tablet by mouth daily. 07/06/15  Yes Historical Provider, MD   BP 116/70 mmHg  Pulse 86  Temp(Src) 97.5  F (36.4 C) (Oral)  Resp 18  Ht 5' 0.13" (1.527 m)  Wt 134 lb 11.2 oz (61.1 kg)  BMI 26.20 kg/m2  SpO2 92% Physical Exam  Constitutional: He is oriented to person, place, and time. He appears well-developed and well-nourished. No distress.  HENT:  Head: Normocephalic and atraumatic.  Mouth/Throat: Oropharynx is clear and moist. No oropharyngeal exudate.  Scabbed lesion to tip of nose (recent skin procedure per family)  Eyes: Conjunctivae and EOM are normal. Pupils are equal, round, and reactive to light.  Neck: Normal range of motion. Neck supple.  No meningismus.  Cardiovascular: Normal rate, regular rhythm, normal heart sounds and intact distal pulses.   No murmur heard. Pulmonary/Chest: Effort normal and breath sounds normal. No respiratory distress. He exhibits no tenderness.  Abdominal: Soft. There is no tenderness. There is no rebound and no guarding.  Musculoskeletal: Normal range of motion. He exhibits edema and tenderness.   Left great toe is erythematous and edematous with clear drainage  Intact DP pulse with doppler  Neurological: He is alert and oriented to person, place, and time. No cranial nerve  deficit. He exhibits normal muscle tone. Coordination normal.  L sided weakness at baseline from previous stroke  Skin: Skin is warm.  Psychiatric: He has a normal mood and affect. His behavior is normal.  Nursing note and vitals reviewed.     ED Course  Procedures (including critical care time) Labs Review Labs Reviewed  CBC WITH DIFFERENTIAL/PLATELET - Abnormal; Notable for the following:    RBC 3.88 (*)    HCT 38.4 (*)    Monocytes Absolute 1.2 (*)    All other components within normal limits  COMPREHENSIVE METABOLIC PANEL - Abnormal; Notable for the following:    Potassium 5.2 (*)    Glucose, Bld 101 (*)    BUN 34 (*)    ALT 14 (*)    All other components within normal limits  PROTIME-INR - Abnormal; Notable for the following:    Prothrombin Time 20.4 (*)    INR 1.75 (*)    All other components within normal limits  LACTIC ACID, PLASMA - Abnormal; Notable for the following:    Lactic Acid, Venous 2.1 (*)    All other components within normal limits  PROTIME-INR - Abnormal; Notable for the following:    Prothrombin Time 22.8 (*)    INR 2.03 (*)    All other components within normal limits  COMPREHENSIVE METABOLIC PANEL - Abnormal; Notable for the following:    Glucose, Bld 100 (*)    BUN 31 (*)    Albumin 2.9 (*)    ALT 13 (*)    Anion gap 4 (*)    All other components within normal limits  CBC WITH DIFFERENTIAL/PLATELET - Abnormal; Notable for the following:    RBC 3.28 (*)    Hemoglobin 10.8 (*)    HCT 32.9 (*)    MCV 100.3 (*)    Monocytes Absolute 1.2 (*)    All other components within normal limits  HEPARIN LEVEL (UNFRACTIONATED) - Abnormal; Notable for the following:    Heparin Unfractionated <0.10 (*)    All other components within normal limits  CULTURE, BLOOD (ROUTINE X 2)  TROPONIN I  GLUCOSE, CAPILLARY  LACTIC ACID, PLASMA  URIC ACID  GLUCOSE, CAPILLARY  HEPARIN LEVEL (UNFRACTIONATED)  I-STAT CG4 LACTIC ACID, ED    Imaging Review Dg Chest 2  View  09/11/2015  CLINICAL DATA:  Hypertension EXAM: CHEST  2 VIEW  COMPARISON:  July 21, 2015 FINDINGS: There is scarring in the lung bases bilaterally. The somewhat nodular appearance in the left base on the prior study is no longer appreciable. There is a calcified granuloma the right base. Lungs are hyperexpanded. There is no frank edema or consolidation. The heart is upper normal in size. The pulmonary vascular is within normal limits. No adenopathy. No bone lesions. IMPRESSION: Bibasilar lung scarring. Calcified granuloma right base. Lungs somewhat hyperexpanded. No edema or consolidation. Electronically Signed   By: Lowella Grip III M.D.   On: 09/11/2015 18:05   Ct Angio Chest Pe W/cm &/or Wo Cm  09/12/2015  CLINICAL DATA:  Hypoxia.  Foot swelling. EXAM: CT ANGIOGRAPHY CHEST WITH CONTRAST TECHNIQUE: Multidetector CT imaging of the chest was performed using the standard protocol during bolus administration of intravenous contrast. Multiplanar CT image reconstructions and MIPs were obtained to evaluate the vascular anatomy. CONTRAST:  14mL OMNIPAQUE IOHEXOL 350 MG/ML SOLN COMPARISON:  06/23/2015 FINDINGS: THORACIC INLET/BODY WALL: No acute abnormality. MEDIASTINUM: Chronic cardiomegaly without pericardial effusion. Essentially non-opacified systemic arterial tree without indication of acute disease. Extensive atherosclerosis, including the coronary arteries. There is chronic poor opacification of bilateral lower lobe segmental and subsegmental branches with eccentric filling defects consistent with chronic emboli. Poorly aerated and scarred lower lobes may contribute to poor opacification of these vessels. There is also chronic underfilling and collapse of medial segment right middle lobe pulmonary arterial branches. Pulmonary hypertension with main pulmonary artery diameter of 4 cm. No acute pulmonary embolism is identified. LUNG WINDOWS: Emphysema and chronic bronchitis. There is extensive airway  debris within the right lower airways and patchy mucoid impaction within left lower lobe bronchi. Bilateral lower lobe atelectasis or scarring, improved on the left since prior. No edema, effusion, or pneumothorax. UPPER ABDOMEN: No acute findings. OSSEOUS: No acute finding.  Remote left-sided rib fractures. Review of the MIP images confirms the above findings. IMPRESSION: 1. Extensive debris with the right lower lobe airways, suspect recurrent aspiration given similar lower lobe findings on September 2016 chest CTs. 2. Remote bilateral pulmonary embolism with poor lower lobe blood flow and pulmonary hypertension. No acute pulmonary embolism identified. 3. Emphysema. Electronically Signed   By: Monte Fantasia M.D.   On: 09/12/2015 02:54   Mr Foot Left Wo Contrast  09/12/2015  CLINICAL DATA:  Left great toe infection following removal of toenail 3 weeks ago. Increasing pain and discoloration despite antibiotics. EXAM: MRI OF THE LEFT FOREFOOT WITHOUT CONTRAST TECHNIQUE: Multiplanar, multisequence MR imaging was performed. No intravenous contrast was administered. COMPARISON:  Radiographs 09/11/2015. FINDINGS: Decreased T1 and increased T2 marrow signal is present throughout the distal phalanx of the great toe. The cortex appears mildly thinned and ill-defined on the coronal T1 weighted images. There is no frank cortical destruction. There is no interphalangeal joint effusion. The proximal phalanx and first metatarsal appear normal. The additional toes and metatarsals demonstrate no significant findings. There is no evidence of soft tissue abscess. There is mild dorsal subcutaneous edema. There is also mild T2 hyperintensity throughout the forefoot musculature, likely secondary to underlying diabetes. IMPRESSION: 1. Marrow changes within the distal phalanx of the great toe suspicious for osteomyelitis. There are possible early cortical changes in the tuft. 2. No evidence of soft tissue abscess or septic joint.  Electronically Signed   By: Richardean Sale M.D.   On: 09/12/2015 10:54   Dg Chest Port 1 View  09/11/2015  CLINICAL DATA:  Decreasing oxygen sats for 1 hour EXAM: PORTABLE CHEST  1 VIEW COMPARISON:  09/11/2015 FINDINGS: Cardiac silhouette within normal limits. Calcified 5 mm granuloma right lower lobe stable increased interstitial markings left lower lobe stable from film performed 1731 hours today. IMPRESSION: Stable appearance from film performed earlier today, in turn stable from 07/21/2015. Electronically Signed   By: Skipper Cliche M.D.   On: 09/11/2015 21:21   Dg Foot 2 Views Left  09/11/2015  CLINICAL DATA:  81 year old with recent removal of the toe nail from the left great toe, presenting with pain and swelling involving the great toe, the lateral foot, extending into the ankle. EXAM: LEFT FOOT - 2 VIEW COMPARISON:  09/04/2015, 12/16/2007. FINDINGS: No evidence of acute or subacute fracture or dislocation. Hallux valgus. Soft tissue swelling involving the great toe and the lateral foot, not significantly changed since the examination 1 week ago. No evidence of underlying osteomyelitis. Generalized osseous demineralization. Degenerative changes involving the midfoot and the IP joints of the toes. No new abnormalities. IMPRESSION: No acute or subacute osseous abnormality. Electronically Signed   By: Evangeline Dakin M.D.   On: 09/11/2015 18:05   I have personally reviewed and evaluated these images and lab results as part of my medical decision-making.   EKG Interpretation   Date/Time:  Monday September 11 2015 17:22:29 EST Ventricular Rate:  83 PR Interval:    QRS Duration: 119 QT Interval:  363 QTC Calculation: 426 R Axis:   48 Text Interpretation:  Atrial fibrillation Nonspecific intraventricular  conduction delay Anteroseptal infarct, age indeterminate Lateral leads are  also involved Atrial fibrillation Confirmed by Carlyann Placide  MD, Donna Silverman  [phone]) on 09/11/2015 5:45:31 PM       MDM   Final diagnoses:  Cellulitis of left foot   patient with cellulitis of left great toe and forefoot. Has been antibiotics for the past one week without improvement. No fever or vomiting.   X-ray does not show any evidence of osteomyelitis. Patient will be started on IV antibiotics for cellulitis with failure of outpatient treatment. dopplerable pulse in left foot present.  INR subtherapeutic. Hx recently diagnosed PE and COPD, wears O2 at night.  Respiratory status stable in the ED on [place].  CXR stable.   Admission d/w Dr. Hal Hope.      Ezequiel Essex, MD 09/12/15 [phone]

## 2015-09-11 NOTE — ED Notes (Addendum)
Pt here for swelling to left big toe radiating into left ankle. sts recently had toenail removed and has been taking abx. Sent her by PCP.

## 2015-09-11 NOTE — Progress Notes (Addendum)
ANTIBIOTIC CONSULT NOTE - INITIAL  Pharmacy Consult for vanc/zosyn Indication: wound infection  L great toe  No Known Allergies  Patient Measurements: Height: 5' 0.13" (152.7 cm) Weight: 137 lb 2 oz (62.2 kg) IBW/kg (Calculated) : 50.3   Vital Signs: Temp: 97.6 F (36.4 C) (12/19 2041) Temp Source: Oral (12/19 2041) BP: 126/70 mmHg (12/19 2041) Pulse Rate: 79 (12/19 2041) Intake/Output from previous day:   Intake/Output from this shift: Total I/O In: -  Out: 200 [Urine:200]  Labs:  Recent Labs  09/11/15 1727  WBC 6.7  HGB 13.0  PLT 239  CREATININE 0.89   Estimated Creatinine Clearance: 47.3 mL/min (by C-G formula based on Cr of 0.89). No results for input(s): VANCOTROUGH, VANCOPEAK, VANCORANDOM, GENTTROUGH, GENTPEAK, GENTRANDOM, TOBRATROUGH, TOBRAPEAK, TOBRARND, AMIKACINPEAK, AMIKACINTROU, AMIKACIN in the last 72 hours.   Microbiology: No results found for this or any previous visit (from the past 720 hour(s)).  Medical History: Past Medical History  Diagnosis Date  . Dysrhythmia     atrial fibrilation  . Hypertension   . Shortness of breath   . Claudication of calf muscles ([place]) 11/05/2012    right calf  . GERD (gastroesophageal reflux disease)   . TIA (transient ischemic attack) 2010  . Peripheral vascular disease ([place])   . On home oxygen therapy     "2L; w/activity" (02/24/2014)  . Neuromuscular disorder (HCC)     dizziness  . Macular degeneration, bilateral     "has had shots in both eyes"  . Coronary artery disease   . COPD (chronic obstructive pulmonary disease) ([place])   . Type II diabetes mellitus ([place])   . Stroke ([place]) 05/2013    "can't use left arm fully since"  . Arthritis     "legs" (02/24/2014)  . Pneumonia     "5 times back to back recently (just finished antibiiotic) " (02/24/2014)    Medications:  Prescriptions prior to admission  Medication Sig Dispense Refill Last Dose  . albuterol (PROVENTIL) (2.5 MG/3ML) 0.083% nebulizer solution  Take 2.5 mg by nebulization 4 (four) times daily.   09/10/2015  . allopurinol (ZYLOPRIM) 100 MG tablet Take 100 mg by mouth 2 (two) times daily.    09/11/2015 at Unknown time  . amoxicillin-clavulanate (AUGMENTIN) 875-125 MG tablet Take 1 tablet by mouth 2 (two) times daily.  0 09/11/2015 at 0800  . budesonide (PULMICORT) 0.5 MG/2ML nebulizer solution Take 0.5 mg by nebulization 2 (two) times daily as needed (for wheezing/shortness of breath).    09/10/2015  . carvedilol (COREG) 6.25 MG tablet Take 6.25 mg by mouth 2 (two) times daily with a meal.   09/11/2015 at 0800  . cilostazol (PLETAL) 50 MG tablet Take 50 mg by mouth 2 (two) times daily.   09/11/2015 at Unknown time  . colchicine 0.6 MG tablet Take 1 tablet (0.6 mg total) by mouth 2 (two) times daily. 10 tablet 0 09/11/2015 at Unknown time  . magnesium oxide (MAG-OX) 400 MG tablet Take 200 mg by mouth daily.   09/11/2015 at Unknown time  . Melatonin 3 MG TABS Take 3 mg by mouth at bedtime as needed (sleep).    4-5 days  . metFORMIN (GLUCOPHAGE) 1000 MG tablet Take 500 mg by mouth 2 (two) times daily with a meal.   09/11/2015 at Unknown time  . mirtazapine (REMERON) 15 MG tablet Take 15 mg by mouth at bedtime.   09/10/2015 at Unknown time  . Multiple Vitamins-Minerals (OCUVITE PRESERVISION PO) Take 1 tablet by mouth daily.  09/11/2015 at Unknown time  . pantoprazole (PROTONIX) 40 MG tablet Take 1 tablet (40 mg total) by mouth 2 (two) times daily. 30 tablet 0 09/11/2015 at Unknown time  . ranitidine (ZANTAC) 300 MG tablet Take 300 mg by mouth at bedtime.   09/10/2015 at Unknown time  . simvastatin (ZOCOR) 40 MG tablet Take 40 mg by mouth every evening.   09/11/2015 at Unknown time  . tiotropium (SPIRIVA) 18 MCG inhalation capsule Place 18 mcg into inhaler and inhale daily.   09/11/2015 at Unknown time  . traMADol (ULTRAM) 50 MG tablet Take 1 tablet (50 mg total) by mouth every 6 (six) hours as needed for moderate pain. 120 tablet 0 09/10/2015   . vitamin B-12 (CYANOCOBALAMIN) 1000 MCG tablet Take 1,000 mcg by mouth daily.   09/11/2015 at Unknown time  . warfarin (COUMADIN) 5 MG tablet Take 1 tablet by mouth daily.  0 09/11/2015 at Unknown time   Assessment: 81 yo M sent from PCP to [facility] with infected L great toe with drainage, redness, erythema, s/p toenail removal 3 weeks ago.  He has been on augmentin ~ 1 week. Wt 62.3, creat 0.89, creat cl ~ 61 ml;min.  WBC WNL, Lactic acid WNL.  Afebrile.   Vancomycin 12/19>> Zosyn 12/19>>  12/19 BC x2>>  Goal of Therapy:  Vancomycin trough level 10-15 mcg/ml  Plan:  - vanc 1 gm given at [place], followed by vanc 1250 q24 to start 12/20 at 1800 - zosyn 3.375 gm IV q8h, infuse each dose over 4 hours - f/u renal fxn, wbc, temp - vanc levels as needed - f/u to see if coumadin ordered - may need to be held for intervention  - I ordered daily INRs  Eudelia Bunch, Pharm.D. [phone] 09/11/2015 9:22 PM

## 2015-09-11 NOTE — ED Notes (Signed)
Report attempted 

## 2015-09-11 NOTE — Progress Notes (Signed)
ANTICOAGULATION CONSULT NOTE - Initial Consult  Pharmacy Consult for heparin Indication: pulmonary embolus and hx Afib  No Known Allergies  Patient Measurements: Height: 5' 0.13" (152.7 cm) Weight: 137 lb 2 oz (62.2 kg) IBW/kg (Calculated) : 50.3 Heparin Dosing Weight: 62 kg  Vital Signs: Temp: 97.6 F (36.4 C) (12/19 2041) Temp Source: Oral (12/19 2041) BP: 126/70 mmHg (12/19 2041) Pulse Rate: 79 (12/19 2041)  Labs:  Recent Labs  09/11/15 1727  HGB 13.0  HCT 38.4*  PLT 239  LABPROT 20.4*  INR 1.75*  CREATININE 0.89  TROPONINI <0.03    Estimated Creatinine Clearance: 47.3 mL/min (by C-G formula based on Cr of 0.89).   Medical History: Past Medical History  Diagnosis Date  . Dysrhythmia     atrial fibrilation  . Hypertension   . Shortness of breath   . Claudication of calf muscles ([place]) 11/05/2012    right calf  . GERD (gastroesophageal reflux disease)   . TIA (transient ischemic attack) 2010  . Peripheral vascular disease ([place])   . On home oxygen therapy     "2L; w/activity" (02/24/2014)  . Neuromuscular disorder (HCC)     dizziness  . Macular degeneration, bilateral     "has had shots in both eyes"  . Coronary artery disease   . COPD (chronic obstructive pulmonary disease) ([place])   . Type II diabetes mellitus ([place])   . Stroke ([place]) 05/2013    "can't use left arm fully since"  . Arthritis     "legs" (02/24/2014)  . Pneumonia     "5 times back to back recently (just finished antibiiotic) " (02/24/2014)     Assessment: 81 yo man admitted with toe infection. Pharmacy consulted to dose heparin for PE.  He is on coumadin PTA for afib and 06/23/15 CT angio: subacute to chronic RML and LL PE Home dose 5 mg daily, last dose 12/19; Admit INR 1.75 CBC WNL, Wt 62 kg.     Goal of Therapy:  Heparin level 0.3-0.7 units/ml Monitor platelets by anticoagulation protocol: Yes   Plan:  - start heparin drip with no bolus at 850 units/hr and check HL with AM labs ~ 7  hrs - daily HL, CBC and INR  Eudelia Bunch, Pharm.D. [id number] 09/11/2015 9:52 PM

## 2015-09-11 NOTE — H&P (Signed)
[facility] History and Physical  [name] [id number] DOB: 02-21-1928 DOA: 09/11/2015  Referring physician: Dr. Wyvonnia Dusky. PCP: [name], MD  Specialists: Dr. Chase Caller. Pulmonologist.  Chief Complaint: Left foot great toe infection.  History obtained from patient's partner as patient has some difficulty recalling things.  HPI: [name] is a 81 y.o. male with history of paroxysmal atrial fibrillation, recently diagnosed PE, history of COPD, hypertension gout was referred to the ER as patient's foot infection was getting worse. Patient had infected left great toe nail which was removed 3 weeks ago by patient's podiatrist. Following which patient was found to have increasing infection of the left great toe and was given oral antibiotics. Despite taking which patient's symptoms progress. Patient was given another course by patient's primary care physician. Despite taking which patient's does worsen with increasing pain and was referred to the ER. On exam patient has darkened left great toe concerning for a skin anemia. Per patient's dorsalis artery is dopplerable. There is also mild swelling of the left foot area. By the time I examined the patient patient also has become hypoxic requiring 5 L oxygen. Patient denies any chest pain or shortness of breath. And is not in distress on exam.    Review of Systems: As presented in the history of presenting illness, rest negative.  Past Medical History  Diagnosis Date  . Dysrhythmia     atrial fibrilation  . Hypertension   . Shortness of breath   . Claudication of calf muscles ([place]) 11/05/2012    right calf  . GERD (gastroesophageal reflux disease)   . TIA (transient ischemic attack) 2010  . Peripheral vascular disease ([place])   . On home oxygen therapy     "2L; w/activity" (02/24/2014)  . Neuromuscular disorder (HCC)     dizziness  . Macular degeneration, bilateral     "has had shots in both eyes"  . Coronary artery  disease   . COPD (chronic obstructive pulmonary disease) ([place])   . Type II diabetes mellitus ([place])   . Stroke ([place]) 05/2013    "can't use left arm fully since"  . Arthritis     "legs" (02/24/2014)  . Pneumonia     "5 times back to back recently (just finished antibiiotic) " (02/24/2014)   Past Surgical History  Procedure Laterality Date  . Removal of pleural drainage catheter Left 05/03/2014    Procedure: REMOVAL OF PLEURAL DRAINAGE CATHETER;  Surgeon: Ivin Poot, MD;  Location: [facility];  Service: Thoracic;  Laterality: Left;  . Foot surgery Left     due to broken foot years ago  . Esophagogastroduodenoscopy N/A 11/02/2013    Procedure: ESOPHAGOGASTRODUODENOSCOPY (EGD);  Surgeon: Cleotis Nipper, MD;  Location: [facility] ENDOSCOPY;  Service: Endoscopy;  Laterality: N/A;  . Vascular surgery      stents to legs  . Cardiac catheterization    . Transurethral resection of prostate    . Femoral artery stent Left 10/2012  . Chest tube insertion Left 02/24/2014    Procedure: INSERTION PLEURAL DRAINAGE CATHETER;  Surgeon: Ivin Poot, MD;  Location: [facility];  Service: Thoracic;  Laterality: Left;  . Esophagogastroduodenoscopy (egd) with propofol N/A 07/06/2014    Procedure: ESOPHAGOGASTRODUODENOSCOPY (EGD) WITH PROPOFOL;  Surgeon: Cleotis Nipper, MD;  Location: [facility];  Service: Endoscopy;  Laterality: N/A;  . Lower extremity angiogram N/A 11/06/2012    Procedure: LOWER EXTREMITY ANGIOGRAM;  Surgeon: Laverda Page, MD;  Location[facility] CATH LAB;  Service: Cardiovascular;  Laterality: N/A;   Social History:  reports that he quit smoking about 33 years ago. His smoking use included Cigarettes. He has a 105 pack-year smoking history. He has never used smokeless tobacco. He reports that he drinks alcohol. He reports that he does not use illicit drugs. Where does patient live home. Can patient participate in ADLs? Yes.  No Known Allergies  Family History:  Family History  Problem Relation Age of  Onset  . Cancer Neg Hx   . CAD Neg Hx   . Hypertension Other       Prior to Admission medications   Medication Sig Start Date End Date Taking? Authorizing Provider  albuterol (PROVENTIL) (2.5 MG/3ML) 0.083% nebulizer solution Take 2.5 mg by nebulization 4 (four) times daily.   Yes Historical Provider, MD  allopurinol (ZYLOPRIM) 100 MG tablet Take 100 mg by mouth 2 (two) times daily.  06/28/14  Yes Historical Provider, MD  amoxicillin-clavulanate (AUGMENTIN) 875-125 MG tablet Take 1 tablet by mouth 2 (two) times daily. 09/04/15  Yes Historical Provider, MD  budesonide (PULMICORT) 0.5 MG/2ML nebulizer solution Take 0.5 mg by nebulization 2 (two) times daily as needed (for wheezing/shortness of breath).    Yes Historical Provider, MD  carvedilol (COREG) 6.25 MG tablet Take 6.25 mg by mouth 2 (two) times daily with a meal.   Yes Historical Provider, MD  cilostazol (PLETAL) 50 MG tablet Take 50 mg by mouth 2 (two) times daily.   Yes Historical Provider, MD  colchicine 0.6 MG tablet Take 1 tablet (0.6 mg total) by mouth 2 (two) times daily. 04/21/14  Yes Junius Creamer, NP  magnesium oxide (MAG-OX) 400 MG tablet Take 200 mg by mouth daily.   Yes Historical Provider, MD  Melatonin 3 MG TABS Take 3 mg by mouth at bedtime as needed (sleep).    Yes Historical Provider, MD  metFORMIN (GLUCOPHAGE) 1000 MG tablet Take 500 mg by mouth 2 (two) times daily with a meal.   Yes Historical Provider, MD  mirtazapine (REMERON) 15 MG tablet Take 15 mg by mouth at bedtime.   Yes Historical Provider, MD  Multiple Vitamins-Minerals (OCUVITE PRESERVISION PO) Take 1 tablet by mouth daily.   Yes Historical Provider, MD  pantoprazole (PROTONIX) 40 MG tablet Take 1 tablet (40 mg total) by mouth 2 (two) times daily. 07/22/14  Yes Charlynne Cousins, MD  ranitidine (ZANTAC) 300 MG tablet Take 300 mg by mouth at bedtime.   Yes Historical Provider, MD  simvastatin (ZOCOR) 40 MG tablet Take 40 mg by mouth every evening.   Yes  Historical Provider, MD  tiotropium (SPIRIVA) 18 MCG inhalation capsule Place 18 mcg into inhaler and inhale daily.   Yes Historical Provider, MD  traMADol (ULTRAM) 50 MG tablet Take 1 tablet (50 mg total) by mouth every 6 (six) hours as needed for moderate pain. 07/25/14  Yes Tiffany L Reed, DO  vitamin B-12 (CYANOCOBALAMIN) 1000 MCG tablet Take 1,000 mcg by mouth daily.   Yes Historical Provider, MD  warfarin (COUMADIN) 5 MG tablet Take 1 tablet by mouth daily. 07/06/15  Yes Historical Provider, MD    Physical Exam: Filed Vitals:   09/11/15 1915 09/11/15 1945 09/11/15 2026 09/11/15 2041  BP: 136/81 117/76  126/70  Pulse: 91 90  79  Temp:    97.6 F (36.4 C)  TempSrc:    Oral  Resp: 18 19  16   Height:   5' 0.13" (1.527 m)   Weight:   62.2 kg (137 lb 2 oz)  SpO2: 98% 98%  79%     General:  Moderately well-developed nourished.  Eyes: Anicteric. No pallor.  ENT: No discharge from the ears eyes nose and mouth.  Neck: No mass felt. No neck rigidity.  Cardiovascular: S1-S2 heard.  Respiratory: No rhonchi or crepitations.  Abdomen: Soft nontender bowel sounds present.  Skin: Left foot great toe skin looks darkened and ischemic. Has mild erythema of the distal foot.  Musculoskeletal: Mild edema of the left foot.  Psychiatric: Appears normal.  Neurologic: Alert awake oriented to time place and person. Moves all extremities.  Labs on Admission:  Basic Metabolic Panel:  Recent Labs Lab 09/11/15 1727  NA 138  K 5.2*  CL 103  CO2 26  GLUCOSE 101*  BUN 34*  CREATININE 0.89  CALCIUM 9.4   Liver Function Tests:  Recent Labs Lab 09/11/15 1727  AST 32  ALT 14*  ALKPHOS 73  BILITOT 1.2  PROT 7.9  ALBUMIN 3.5   No results for input(s): LIPASE, AMYLASE in the last 168 hours. No results for input(s): AMMONIA in the last 168 hours. CBC:  Recent Labs Lab 09/11/15 1727  WBC 6.7  NEUTROABS 3.8  HGB 13.0  HCT 38.4*  MCV 99.0  PLT 239   Cardiac  Enzymes:  Recent Labs Lab 09/11/15 1727  TROPONINI <0.03    BNP (last 3 results) No results for input(s): BNP in the last 8760 hours.  ProBNP (last 3 results) No results for input(s): PROBNP in the last 8760 hours.  CBG: No results for input(s): GLUCAP in the last 168 hours.  Radiological Exams on Admission: Dg Chest 2 View  09/11/2015  CLINICAL DATA:  Hypertension EXAM: CHEST  2 VIEW COMPARISON:  July 21, 2015 FINDINGS: There is scarring in the lung bases bilaterally. The somewhat nodular appearance in the left base on the prior study is no longer appreciable. There is a calcified granuloma the right base. Lungs are hyperexpanded. There is no frank edema or consolidation. The heart is upper normal in size. The pulmonary vascular is within normal limits. No adenopathy. No bone lesions. IMPRESSION: Bibasilar lung scarring. Calcified granuloma right base. Lungs somewhat hyperexpanded. No edema or consolidation. Electronically Signed   By: Lowella Grip III M.D.   On: 09/11/2015 18:05   Dg Chest Port 1 View  09/11/2015  CLINICAL DATA:  Decreasing oxygen sats for 1 hour EXAM: PORTABLE CHEST 1 VIEW COMPARISON:  09/11/2015 FINDINGS: Cardiac silhouette within normal limits. Calcified 5 mm granuloma right lower lobe stable increased interstitial markings left lower lobe stable from film performed 1731 hours today. IMPRESSION: Stable appearance from film performed earlier today, in turn stable from 07/21/2015. Electronically Signed   By: Skipper Cliche M.D.   On: 09/11/2015 21:21   Dg Foot 2 Views Left  09/11/2015  CLINICAL DATA:  81 year old with recent removal of the toe nail from the left great toe, presenting with pain and swelling involving the great toe, the lateral foot, extending into the ankle. EXAM: LEFT FOOT - 2 VIEW COMPARISON:  09/04/2015, 12/16/2007. FINDINGS: No evidence of acute or subacute fracture or dislocation. Hallux valgus. Soft tissue swelling involving the great toe  and the lateral foot, not significantly changed since the examination 1 week ago. No evidence of underlying osteomyelitis. Generalized osseous demineralization. Degenerative changes involving the midfoot and the IP joints of the toes. No new abnormalities. IMPRESSION: No acute or subacute osseous abnormality. Electronically Signed   By: Evangeline Dakin M.D.   On: 09/11/2015 18:05  EKG: Independently reviewed. A. fib with rate control.  Assessment/Plan Principal Problem:   Cellulitis of toe of left foot Active Problems:   COPD (chronic obstructive pulmonary disease) (HCC)   Pulmonary embolism on right (HCC)   Cellulitis   Acute respiratory failure with hypoxia (HCC)   1. Cellulitis of the left foot and great toe - patient's left foot great toe looks dark and ischemic. But patient has dopplerable dorsalis pedis artery. I have ordered MRI of the left foot. We will check ABI and Dopplers to rule out DVT since patient has edema and iron Aricept therapy. I have placed patient on empiric antibiotics for now. Consult orthopedics in a.m. 2. Acute respiratory failure hypoxia - patient is hypoxic requiring 5 L of oxygen. Patient does use oxygen at home but this is increased from baseline. Since patient's INR is subtherapeutic I have ordered a CT angiogram to rule out PE. Patient will be on IV heparin in anticipation of possible procedure and hold Coumadin. 3. Chronic atrial fibrillation - rate controlled. Patient is on heparin. Chest 2 vasc score is more than 2. 4. Hypertension - continue present medications. 5. COPD [place] and wheezing. 6. Gout - check uric acid levels. 7. Chronic anemia - follow CBC. 8. Diabetes mellitus type 2 - on sliding scale coverage. Hold metformin while inpatient.  I have reviewed patient's old charts of labs.   DVT Prophylaxis heparin.  Code Status: Full code.  Family Communication: Discussed with patient and his partner.  Disposition Plan: Admit to inpatient.     Kairie Vangieson N. [facility] Pager [phone].  If 7PM-7AM, please contact night-coverage [email or web address] Password [facility] 09/11/2015, 9:38 PM

## 2015-09-11 NOTE — Progress Notes (Signed)
Pt arrived to unit o2 Sat 79% on RA admitting MD. Placed on telemetry with continuous O2 monitoring new orders received awiting MD evaluation and will continueto monitor. Lungs were diminished.

## 2015-09-12 ENCOUNTER — Encounter (HOSPITAL_COMMUNITY): Payer: Self-pay | Admitting: General Practice

## 2015-09-12 ENCOUNTER — Inpatient Hospital Stay (HOSPITAL_COMMUNITY): Payer: Medicare Other | Admitting: Certified Registered Nurse Anesthetist

## 2015-09-12 ENCOUNTER — Encounter (HOSPITAL_COMMUNITY)
Admission: EM | Disposition: A | Discharge status: Home Health | Payer: Self-pay | Source: Home / Self Care | Attending: Internal Medicine

## 2015-09-12 ENCOUNTER — Inpatient Hospital Stay (HOSPITAL_COMMUNITY): Payer: Medicare Other

## 2015-09-12 DIAGNOSIS — J9621 Acute and chronic respiratory failure with hypoxia: Secondary | ICD-10-CM

## 2015-09-12 DIAGNOSIS — R609 Edema, unspecified: Secondary | ICD-10-CM

## 2015-09-12 DIAGNOSIS — I2699 Other pulmonary embolism without acute cor pulmonale: Secondary | ICD-10-CM

## 2015-09-12 DIAGNOSIS — M79609 Pain in unspecified limb: Secondary | ICD-10-CM

## 2015-09-12 DIAGNOSIS — F0390 Unspecified dementia without behavioral disturbance: Secondary | ICD-10-CM

## 2015-09-12 DIAGNOSIS — M86172 Other acute osteomyelitis, left ankle and foot: Secondary | ICD-10-CM

## 2015-09-12 HISTORY — PX: AMPUTATION: SHX166

## 2015-09-12 LAB — CBC WITH DIFFERENTIAL/PLATELET
BASOS PCT: 1 %
Basophils Absolute: 0 10*3/uL (ref 0.0–0.1)
Eosinophils Absolute: 0.2 10*3/uL (ref 0.0–0.7)
Eosinophils Relative: 4 %
HEMATOCRIT: 32.9 % — AB (ref 39.0–52.0)
HEMOGLOBIN: 10.8 g/dL — AB (ref 13.0–17.0)
LYMPHS ABS: 1.9 10*3/uL (ref 0.7–4.0)
Lymphocytes Relative: 36 %
MCH: 32.9 pg (ref 26.0–34.0)
MCHC: 32.8 g/dL (ref 30.0–36.0)
MCV: 100.3 fL — ABNORMAL HIGH (ref 78.0–100.0)
MONOS PCT: 24 %
Monocytes Absolute: 1.2 10*3/uL — ABNORMAL HIGH (ref 0.1–1.0)
NEUTROS ABS: 1.8 10*3/uL (ref 1.7–7.7)
NEUTROS PCT: 35 %
Platelets: 202 10*3/uL (ref 150–400)
RBC: 3.28 MIL/uL — ABNORMAL LOW (ref 4.22–5.81)
RDW: 13.4 % (ref 11.5–15.5)
WBC: 5.1 10*3/uL (ref 4.0–10.5)

## 2015-09-12 LAB — HEPARIN LEVEL (UNFRACTIONATED)
HEPARIN UNFRACTIONATED: 0.34 [IU]/mL (ref 0.30–0.70)
Heparin Unfractionated: <0.1 IU/mL — ABNORMAL LOW (ref 0.30–0.70)

## 2015-09-12 LAB — COMPREHENSIVE METABOLIC PANEL
ALT: 13 U/L — ABNORMAL LOW (ref 17–63)
ANION GAP: 4 — AB (ref 5–15)
AST: 17 U/L (ref 15–41)
Albumin: 2.9 g/dL — ABNORMAL LOW (ref 3.5–5.0)
Alkaline Phosphatase: 62 U/L (ref 38–126)
BUN: 31 mg/dL — ABNORMAL HIGH (ref 6–20)
CALCIUM: 9 mg/dL (ref 8.9–10.3)
CHLORIDE: 106 mmol/L (ref 101–111)
CO2: 28 mmol/L (ref 22–32)
Creatinine, Ser: 0.93 mg/dL (ref 0.61–1.24)
GFR calc non Af Amer: >60 mL/min (ref >60)
Glucose, Bld: 100 mg/dL — ABNORMAL HIGH (ref 65–99)
POTASSIUM: 4.4 mmol/L (ref 3.5–5.1)
SODIUM: 138 mmol/L (ref 135–145)
Total Bilirubin: 0.4 mg/dL (ref 0.3–1.2)
Total Protein: 6.6 g/dL (ref 6.5–8.1)

## 2015-09-12 LAB — PROTIME-INR
INR: 2.03 — ABNORMAL HIGH (ref 0.00–1.49)
Prothrombin Time: 22.8 seconds — ABNORMAL HIGH (ref 11.6–15.2)

## 2015-09-12 LAB — SURGICAL PCR SCREEN
MRSA, PCR: NEGATIVE
Staphylococcus aureus: NEGATIVE

## 2015-09-12 LAB — GLUCOSE, CAPILLARY
GLUCOSE-CAPILLARY: 83 mg/dL (ref 65–99)
GLUCOSE-CAPILLARY: 76 mg/dL (ref 65–99)
GLUCOSE-CAPILLARY: 112 mg/dL — AB (ref 65–99)
Glucose-Capillary: 92 mg/dL (ref 65–99)
Glucose-Capillary: 85 mg/dL (ref 65–99)

## 2015-09-12 LAB — URIC ACID: URIC ACID, SERUM: 4.4 mg/dL (ref 4.4–7.6)

## 2015-09-12 LAB — LACTIC ACID, PLASMA: LACTIC ACID, VENOUS: 0.7 mmol/L (ref 0.5–2.0)

## 2015-09-12 SURGERY — AMPUTATION, FOOT, RAY
Anesthesia: Monitor Anesthesia Care | Site: Foot | Laterality: Left

## 2015-09-12 MED ORDER — CETYLPYRIDINIUM CHLORIDE 0.05 % MT LIQD
7.0000 mL | Freq: Two times a day (BID) | OROMUCOSAL | Status: DC
  Administered 2015-09-12 – 2015-09-14 (×4): 7 mL via OROMUCOSAL

## 2015-09-12 MED ORDER — FENTANYL CITRATE (PF) 250 MCG/5ML IJ SOLN
INTRAMUSCULAR | Status: AC
  Filled 2015-09-12: qty 5

## 2015-09-12 MED ORDER — FENTANYL CITRATE (PF) 100 MCG/2ML IJ SOLN
25.0000 ug | INTRAMUSCULAR | Status: DC | PRN
Start: 2015-09-12 — End: 2015-09-12

## 2015-09-12 MED ORDER — LIDOCAINE HCL (CARDIAC) 20 MG/ML IV SOLN
INTRAVENOUS | Status: DC | PRN
  Administered 2015-09-12: 50 mg via INTRATRACHEAL

## 2015-09-12 MED ORDER — SODIUM CHLORIDE 0.9 % IR SOLN
Status: DC | PRN
  Administered 2015-09-12: 1000 mL

## 2015-09-12 MED ORDER — PROPOFOL 10 MG/ML IV BOLUS
INTRAVENOUS | Status: DC | PRN
  Administered 2015-09-12: 30 mg via INTRAVENOUS
  Administered 2015-09-12: 20 mg via INTRAVENOUS

## 2015-09-12 MED ORDER — SODIUM CHLORIDE 0.9 % IV SOLN
INTRAVENOUS | Status: DC | PRN
  Administered 2015-09-12: 22:00:00 via INTRAVENOUS

## 2015-09-12 MED ORDER — LIDOCAINE HCL (PF) 1 % IJ SOLN
INTRAMUSCULAR | Status: AC
  Filled 2015-09-12: qty 30

## 2015-09-12 SURGICAL SUPPLY — 48 items
BANDAGE ELASTIC 4 VELCRO ST LF (GAUZE/BANDAGES/DRESSINGS) ×2 IMPLANT
BLADE AVERAGE 25MMX9MM (BLADE) ×1
BLADE AVERAGE 25X9 (BLADE) ×1 IMPLANT
BLADE SURG 10 STRL SS (BLADE) IMPLANT
BNDG CMPR 9X4 STRL LF SNTH (GAUZE/BANDAGES/DRESSINGS) ×1
BNDG COHESIVE 4X5 TAN STRL (GAUZE/BANDAGES/DRESSINGS) ×3 IMPLANT
BNDG ESMARK 4X9 LF (GAUZE/BANDAGES/DRESSINGS) ×3 IMPLANT
BNDG GAUZE ELAST 4 BULKY (GAUZE/BANDAGES/DRESSINGS) ×3 IMPLANT
COVER MAYO STAND STRL (DRAPES) ×3 IMPLANT
CUFF TOURNIQUET SINGLE 34IN LL (TOURNIQUET CUFF) IMPLANT
CUFF TOURNIQUET SINGLE 44IN (TOURNIQUET CUFF) IMPLANT
DRAPE SURG 17X23 STRL (DRAPES) ×3 IMPLANT
DRAPE U-SHAPE 47X51 STRL (DRAPES) ×3 IMPLANT
DRSG ADAPTIC 3X8 NADH LF (GAUZE/BANDAGES/DRESSINGS) ×3 IMPLANT
DURAPREP 26ML APPLICATOR (WOUND CARE) ×3 IMPLANT
ELECT CAUTERY BLADE 6.4 (BLADE) ×2 IMPLANT
ELECT REM PT RETURN 9FT ADLT (ELECTROSURGICAL) ×3
ELECTRODE REM PT RTRN 9FT ADLT (ELECTROSURGICAL) ×1 IMPLANT
GAUZE SPONGE 4X4 12PLY STRL (GAUZE/BANDAGES/DRESSINGS) ×3 IMPLANT
GAUZE SPONGE 4X4 16PLY XRAY LF (GAUZE/BANDAGES/DRESSINGS) ×3 IMPLANT
GLOVE BIOGEL PI IND STRL 8 (GLOVE) ×1 IMPLANT
GLOVE BIOGEL PI INDICATOR 8 (GLOVE) ×2
GLOVE SURG ORTHO 8.0 STRL STRW (GLOVE) ×3 IMPLANT
GOWN STRL REUS W/ TWL LRG LVL3 (GOWN DISPOSABLE) ×2 IMPLANT
GOWN STRL REUS W/ TWL XL LVL3 (GOWN DISPOSABLE) ×1 IMPLANT
GOWN STRL REUS W/TWL LRG LVL3 (GOWN DISPOSABLE) ×6
GOWN STRL REUS W/TWL XL LVL3 (GOWN DISPOSABLE) ×3
HANDPIECE INTERPULSE COAX TIP (DISPOSABLE)
KIT BASIN OR (CUSTOM PROCEDURE TRAY) ×3 IMPLANT
KIT ROOM TURNOVER OR (KITS) ×3 IMPLANT
MANIFOLD NEPTUNE II (INSTRUMENTS) ×3 IMPLANT
NS IRRIG 1000ML POUR BTL (IV SOLUTION) ×3 IMPLANT
PACK ORTHO EXTREMITY (CUSTOM PROCEDURE TRAY) ×3 IMPLANT
PAD ARMBOARD 7.5X6 YLW CONV (MISCELLANEOUS) ×6 IMPLANT
PAD CAST 4YDX4 CTTN HI CHSV (CAST SUPPLIES) ×1 IMPLANT
PADDING CAST COTTON 4X4 STRL (CAST SUPPLIES) ×3
SET HNDPC FAN SPRY TIP SCT (DISPOSABLE) IMPLANT
SPONGE LAP 18X18 X RAY DECT (DISPOSABLE) ×3 IMPLANT
SUCTION FRAZIER TIP 10 FR DISP (SUCTIONS) ×3 IMPLANT
SUT ETHILON 3 0 PS 1 (SUTURE) ×3 IMPLANT
SUT VIC AB 3-0 FS2 27 (SUTURE) ×3 IMPLANT
SYR CONTROL 10ML LL (SYRINGE) IMPLANT
TOWEL OR 17X24 6PK STRL BLUE (TOWEL DISPOSABLE) ×3 IMPLANT
TOWEL OR 17X26 10 PK STRL BLUE (TOWEL DISPOSABLE) ×3 IMPLANT
TUBE CONNECTING 12'X1/4 (SUCTIONS) ×1
TUBE CONNECTING 12X1/4 (SUCTIONS) ×2 IMPLANT
WATER STERILE IRR 1000ML POUR (IV SOLUTION) ×3 IMPLANT
YANKAUER SUCT BULB TIP NO VENT (SUCTIONS) ×3 IMPLANT

## 2015-09-12 NOTE — Telephone Encounter (Signed)
Atc, no answer, no vm. Letter sent to pt.  Will close message per triage protocol.

## 2015-09-12 NOTE — Transfer of Care (Signed)
Immediate Anesthesia Transfer of Care Note  Patient: [name]  Procedure(s) Performed: Procedure(s): LEFT GREAT TOE AMPUTATION (Left)  Patient Location: PACU  Anesthesia Type:MAC combined with regional for post-op pain  Level of Consciousness: awake, alert  and oriented  Airway & Oxygen Therapy: Patient connected to nasal cannula oxygen  Post-op Assessment: Report given to RN and Post -op Vital signs reviewed and stable  Post vital signs: Reviewed and stable  Last Vitals:  Filed Vitals:   09/12/15 0527 09/12/15 1551  BP: 116/70 105/60  Pulse: 86 80  Temp: 36.4 C 36.3 C  Resp: 18 15    Complications: No apparent anesthesia complications

## 2015-09-12 NOTE — Progress Notes (Signed)
Consult to to see [name] about left great toe infection Records review shows great toe osteomyelitis Patient is on heparin he has a history of pulmonary embolism Plan to hold heparin and perform surgery tonight which will be great toe amputation restart heparin soon thereafter

## 2015-09-12 NOTE — Anesthesia Preprocedure Evaluation (Addendum)
Anesthesia Evaluation  Patient identified by MRN, date of birth, ID band Patient awake    Reviewed: Allergy & Precautions, NPO status   Airway Mallampati: II  TM Distance: >3 FB     Dental   Pulmonary shortness of breath, pneumonia, COPD, former smoker,    breath sounds clear to auscultation       Cardiovascular hypertension, + CAD and + Peripheral Vascular Disease  + dysrhythmias  Rhythm:Regular Rate:Normal     Neuro/Psych    GI/Hepatic Neg liver ROS, GERD  ,  Endo/Other  diabetes  Renal/GU Renal disease     Musculoskeletal  (+) Arthritis ,   Abdominal   Peds  Hematology  (+) anemia ,   Anesthesia Other Findings   Reproductive/Obstetrics                           Anesthesia Physical Anesthesia Plan  ASA: III  Anesthesia Plan: MAC and Regional   Post-op Pain Management:    Induction: Intravenous  Airway Management Planned:   Additional Equipment:   Intra-op Plan:   Post-operative Plan:   Informed Consent: I have reviewed the patients History and Physical, chart, labs and discussed the procedure including the risks, benefits and alternatives for the proposed anesthesia with the patient or authorized representative who has indicated his/her understanding and acceptance.   Dental advisory given  Plan Discussed with: CRNA and Anesthesiologist  Anesthesia Plan Comments:         Anesthesia Quick Evaluation

## 2015-09-12 NOTE — Progress Notes (Signed)
Attempted to call report to OR RN. No pickup

## 2015-09-12 NOTE — Anesthesia Procedure Notes (Signed)
Anesthesia Regional Block:  Ankle block  Pre-Anesthetic Checklist: ,, timeout performed, Correct Patient, Correct Site, Correct Laterality, Correct Procedure, Correct Position, site marked, Risks and benefits discussed,  Surgical consent,  Pre-op evaluation,  At surgeon's request and post-op pain management  Laterality: Left  Prep: chloraprep       Needles:       Needle Gauge: 22 and 22 G    Additional Needles: Ankle block Narrative:   Performed by: Personally  Anesthesiologist: EDWARDS, Bertie Mcconathy

## 2015-09-12 NOTE — Progress Notes (Signed)
Utilization review completed. Lesslie Mckeehan, RN, BSN. 

## 2015-09-12 NOTE — Progress Notes (Signed)
CT advised not to give patient Metformin for 2 days.

## 2015-09-12 NOTE — Op Note (Signed)
NAME:  SHAYLON, MANGAL NO.:  [id number]  MEDICAL RECORD NO.:  [id number]  LOCATION:  [facility]                         FACILITY:  [facility]  PHYSICIAN:  Anderson Malta, M.D.    DATE OF BIRTH:  1927-09-24  DATE OF PROCEDURE: DATE OF DISCHARGE:                              OPERATIVE REPORT   PREOPERATIVE DIAGNOSIS:  Left infected great toe.  POSTOPERATIVE DIAGNOSIS:  Left infected great toe.  PROCEDURE:  Left great toe MTP amputation.  SURGEON:  Anderson Malta, M.D.  ASSISTANT:  None.  ANESTHESIA:  Regional.  INDICATIONS:  [name] is a patient with infected left great toe, presents for surgical management after explanation of risks and benefits.  PROCEDURE IN DETAIL:  The patient was brought to operating room, where regional anesthetic was induced.  Time-out was called.  Left leg was prepped with ChloraPrep solution and draped in a sterile manner.  Ankle block was utilized.  An elliptical incision was made around the proximal aspect of the proximal phalanx where the skin was potentially viable. This kept the nonviable skin and gangrenous toe portion away from the more healthy-appearing tissue.  Elliptical incision was made, MTP joint was ellipsed, and the toe was removed at the MTP joint.  Some bleeding was present.  Thorough irrigation was performed with a liter of irrigating solution.  Then, the toe amputation was closed in layers using 3-0 Vicryl and 3-0 nylon.  Bulky dressing was applied.  The patient tolerated the procedure well without immediate complications. Transferred to recovery room in stable condition.     Anderson Malta, M.D.     GSD/MEDQ  D:  09/12/2015  T:  09/12/2015  Job:  [id number]

## 2015-09-12 NOTE — Brief Op Note (Signed)
09/11/2015 - 09/12/2015  11:00 PM  PATIENT:  [name]  81 y.o. male  PRE-OPERATIVE DIAGNOSIS:  INFECTED LEFT GREAT TOE  POST-OPERATIVE DIAGNOSIS:  INFECTED LEFT GREAT TOE  PROCEDURE:  Procedure(s): LEFT GREAT TOE AMPUTATION  SURGEON:  Surgeon(s): Meredith Pel, MD  ASSISTANT: none  ANESTHESIA:   regional  EBL: 2 ml       BLOOD ADMINISTERED: none  DRAINS: none   LOCAL MEDICATIONS USED:  none  SPECIMEN:  Toe to path  COUNTS:  YES  TOURNIQUET:    DICTATION: .Other Dictation: Dictation Number [id number]  PLAN OF CARE: Admit to inpatient   PATIENT DISPOSITION:  PACU - hemodynamically stable

## 2015-09-12 NOTE — Progress Notes (Signed)
ANTICOAGULATION CONSULT NOTE - Follow-up Consult  Pharmacy Consult for heparin Indication: pulmonary embolus and hx Afib  No Known Allergies  Patient Measurements: Height: 5' 0.13" (152.7 cm) Weight: 134 lb 11.2 oz (61.1 kg) IBW/kg (Calculated) : 50.3 Heparin Dosing Weight: 62 kg  Vital Signs: Temp: 97.5 F (36.4 C) (12/20 0527) Temp Source: Oral (12/20 0527) BP: 116/70 mmHg (12/20 0527) Pulse Rate: 86 (12/20 0527)  Labs:  Recent Labs  09/11/15 1727 09/12/15 0406  HGB 13.0 10.8*  HCT 38.4* 32.9*  PLT 239 202  LABPROT 20.4* 22.8*  INR 1.75* 2.03*  HEPARINUNFRC  --  <0.10*  CREATININE 0.89 0.93  TROPONINI <0.03  --     Estimated Creatinine Clearance: 44.8 mL/min (by C-G formula based on Cr of 0.93).   Assessment: 81 yo man admitted with toe infection. Pharmacy consulted to dose heparin for PE. 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 Home dose 5 mg daily, last dose 12/19; Admit INR 1.75. INR now up to 2.03. Heparin level undetectable on 850 units/hr. Hgb down a bit, plt ok. No issues with line or bleeding reported per RN.    Goal of Therapy:  Heparin level 0.3-0.7 units/ml Monitor platelets by anticoagulation protocol: Yes   Plan:  - Increase heparin gtt to 1100 units/hr - F/u 8 hr heparin level - Consider d/c heparin if coumadin able to be restarted - Daily HL, CBC and INR  Sherlon Handing, PharmD, BCPS Clinical pharmacist, pager [phone] 09/12/2015 6:05 AM

## 2015-09-12 NOTE — Evaluation (Signed)
Clinical/Bedside Swallow Evaluation Patient Details  Name: CHRISHUN KIMURA MRN: [id number] Date of Birth: [birth year]  Today's Date: 09/12/2015 Time: SLP Start Time (ACUTE ONLY): [id number] SLP Stop Time (ACUTE ONLY): 1400 SLP Time Calculation (min) (ACUTE ONLY): 15 min  Past Medical History:  Past Medical History  Diagnosis Date  . Dysrhythmia     atrial fibrilation  . Hypertension   . Shortness of breath   . Claudication of calf muscles ([place]) 11/05/2012    right calf  . GERD (gastroesophageal reflux disease)   . TIA (transient ischemic attack) 2010  . Peripheral vascular disease ([place])   . On home oxygen therapy     "2L; w/activity" (02/24/2014)  . Neuromuscular disorder ([facility])     dizziness  . Macular degeneration, bilateral     "has had shots in both eyes"  . Coronary artery disease   . COPD (chronic obstructive pulmonary disease) ([place])   . Type II diabetes mellitus ([place])   . Stroke ([place]) 05/2013    "can't use left arm fully since"  . Arthritis     "legs" (02/24/2014)  . Pneumonia     "5 times back to back recently (just finished antibiiotic) " (02/24/2014)  . Cellulitis and abscess of toe 08/2015    LEFT FOOT    Past Surgical History:  Past Surgical History  Procedure Laterality Date  . Removal of pleural drainage catheter Left 05/03/2014    Procedure: REMOVAL OF PLEURAL DRAINAGE CATHETER;  Surgeon: Ivin Poot, MD;  Location: [facility];  Service: Thoracic;  Laterality: Left;  . Foot surgery Left     due to broken foot years ago  . Esophagogastroduodenoscopy N/A 11/02/2013    Procedure: ESOPHAGOGASTRODUODENOSCOPY (EGD);  Surgeon: Cleotis Nipper, MD;  Location: [facility] ENDOSCOPY;  Service: Endoscopy;  Laterality: N/A;  . Vascular surgery      stents to legs  . Cardiac catheterization    . Transurethral resection of prostate    . Femoral artery stent Left 10/2012  . Chest tube insertion Left 02/24/2014    Procedure: INSERTION PLEURAL DRAINAGE CATHETER;  Surgeon: Ivin Poot, MD;   Location: [facility];  Service: Thoracic;  Laterality: Left;  . Esophagogastroduodenoscopy (egd) with propofol N/A 07/06/2014    Procedure: ESOPHAGOGASTRODUODENOSCOPY (EGD) WITH PROPOFOL;  Surgeon: Cleotis Nipper, MD;  Location: [facility];  Service: Endoscopy;  Laterality: N/A;  . Lower extremity angiogram N/A 11/06/2012    Procedure: LOWER EXTREMITY ANGIOGRAM;  Surgeon: Laverda Page, MD;  Location: [facility] CATH LAB;  Service: Cardiovascular;  Laterality: N/A;   HPI:  81 y.o. male with hx of CVA admitted with cellulitis of great toe on left foot, acute hypoxic respiratory failure (requires home 02). Hx dysphagia.   Assessment / Plan / Recommendation Clinical Impression  Pt has remote hx of dysphagia, with last MBS Nov 2015 revealing silent penetration of thin liquids.  Today, pt is alert with adequate mastication, brisk swallow response, and no overt s/s of aspiration during assessment.  After completion of eval, pt presented with increased congested cough.  CT chest results per radiologist read "extensive debris with the right lower lobe airways, suspect recurrent aspiration given similar lower lobe findings on September 2016."  Recommend maintaining regular diet with thin liquids; meds whole in puree. SLP will follow briefly given CT chest results and equivocal clinical eval findings.     Aspiration Risk  Mild aspiration risk    Diet Recommendation     Medication Administration: Whole meds with puree  Other  Recommendations Oral Care Recommendations: Oral care BID   Follow up Recommendations  None    Frequency and Duration min 1 x/week  1 week       Prognosis        Swallow Study   General Date of Onset: 09/11/15 HPI: 81 y.o. male with hx of CVA admitted with cellulitis of great toe on left foot, acute hypoxic respiratory failure (requires home 02). Type of Study: Bedside Swallow Evaluation Previous Swallow Assessment: 08/05/14: OPMBS with recs for dys 3/thins; aspiration  risk  Diet Prior to this Study: Regular;Thin liquids Temperature Spikes Noted: No Respiratory Status: Nasal cannula History of Recent Intubation: No Behavior/Cognition: Alert;Cooperative Oral Cavity Assessment: Within Functional Limits Oral Care Completed by SLP: No Oral Cavity - Dentition: Adequate natural dentition Vision: Functional for self-feeding Self-Feeding Abilities: Able to feed self (with right hand; left hemiparesis after CVA) Patient Positioning: Upright in bed Baseline Vocal Quality: Normal Volitional Cough: Strong Volitional Swallow: Able to elicit    Oral/Motor/Sensory Function Overall Oral Motor/Sensory Function: Within functional limits   Ice Chips Ice chips: Within functional limits Presentation: Self Fed   Thin Liquid Thin Liquid: Within functional limits Presentation: Straw    Nectar Thick Nectar Thick Liquid: Not tested   Honey Thick Honey Thick Liquid: Not tested   Puree Puree: Within functional limits Presentation: Self Fed;Spoon   Solid Solid: Within functional limits      Jacki Couse L. Tivis Ringer, [place] CCC/SLP Pager [phone]  Juan Quam Laurice 09/12/2015,2:07 PM

## 2015-09-12 NOTE — Progress Notes (Signed)
ANTICOAGULATION CONSULT NOTE - Follow-up Consult  Pharmacy Consult for heparin Indication: pulmonary embolus and hx Afib  No Known Allergies  Patient Measurements: Height: 5' 0.13" (152.7 cm) Weight: 134 lb 11.2 oz (61.1 kg) IBW/kg (Calculated) : 50.3 Heparin Dosing Weight: 62 kg  Vital Signs: Temp: 97.4 F (36.3 C) (12/20 1551) Temp Source: Oral (12/20 1551) BP: 105/60 mmHg (12/20 1551) Pulse Rate: 80 (12/20 1551)  Labs:  Recent Labs  09/11/15 1727 09/12/15 0406 09/12/15 1630  HGB 13.0 10.8*  --   HCT 38.4* 32.9*  --   PLT 239 202  --   LABPROT 20.4* 22.8*  --   INR 1.75* 2.03*  --   HEPARINUNFRC  --  <0.10* 0.34  CREATININE 0.89 0.93  --   TROPONINI <0.03  --   --     Estimated Creatinine Clearance: 44.8 mL/min (by C-G formula based on Cr of 0.93).   Assessment: 81 yo man admitted with toe infection. Pharmacy consulted to dose heparin for PE. 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 Home dose 5 mg daily, last dose 12/19; Admit INR 1.75. INR now up to 2.03.  Heparin level therapeutic at 0.34 after rate increased to 1100 units/hr. Heparin turned off around 1630 for OR at 2230 for great toe amputation.  Heparin to be resumed post-op.  Goal of Therapy:  Heparin level 0.3-0.7 units/ml Monitor platelets by anticoagulation protocol: Yes   Plan:  -heparin turned off around 1630 for OR at 2230 - heparin to be resumed post-op, exact timing per surgeon - rec to resume at rate of 1100 units/hr - f/u to resume coumadin - daily HL/CBC/INR  Eudelia Bunch, Pharm.D. [phone] 09/12/2015 5:47 PM

## 2015-09-12 NOTE — Care Management Note (Signed)
Case Management Note  Patient Details  Name: [name] MRN: [id number] Date of Birth: [birth year]  Subjective/Objective:                  Date-09/12/15 Initial Assessment Spoke with patient at the bedside along with [place] [phone].  Introduced self as Tourist information centre manager and explained role in discharge planning and how to be reached.  Verified patient lives at home in [facility] with wife.  Verified patient anticipates to go home with spouse, at time of discharge and will have full-time supervision by wife at this time to best of their knowledge.  Patient has DME walker  wheelchair. Expressed potential need for no other DME.  Patient denied   needing help with their medication.  Patient is driven by wife to MD appointments.  Verified patient has PCP [name]. Patient states they currently receive [facility] services through no one. Just finished with Legrand Pitts for [facility] PT Patient was provided choice and selected [place] for home health needs if they arise.  Plan: CM will continue to follow for discharge planning and [facility] resources.   Carles Collet RN BSN CM [phone]   Action/Plan:   Expected Discharge Date:                  Expected Discharge Plan:  [place]  In-House Referral:     Discharge planning Services  CM Consult  Post Acute Care Choice:    Choice offered to:     DME Arranged:    DME Agency:     HH Arranged:    HH Agency:     Status of Service:  In process, will continue to follow  Medicare Important Message Given:    Date Medicare IM Given:    Medicare IM give by:    Date Additional Medicare IM Given:    Additional Medicare Important Message give by:     If discussed at [place] of Stay Meetings, dates discussed:    Additional Comments:  Carles Collet, RN 09/12/2015, 11:12 AM

## 2015-09-12 NOTE — Consult Note (Signed)
Reason for Consult:left toe infection Referring Physician: Dr Sonda Primes is an 81 y.o. male.  HPI: [name] is an 81 year old patient with left great toe infection and spin there for several weeks. Reviewed pictures with his caregiver. He had no surgery yesterday. He is on heparin currently because of history of DVT and pulmonary embolism. The toes have been draining but it is turning black. MRI scan shows ostium myelitis of the distal tuft.  Past Medical History  Diagnosis Date  . Dysrhythmia     atrial fibrilation  . Hypertension   . Shortness of breath   . Claudication of calf muscles ([place]) 11/05/2012    right calf  . GERD (gastroesophageal reflux disease)   . TIA (transient ischemic attack) 2010  . Peripheral vascular disease ([place])   . On home oxygen therapy     "2L; w/activity" (02/24/2014)  . Neuromuscular disorder ([facility])     dizziness  . Macular degeneration, bilateral     "has had shots in both eyes"  . Coronary artery disease   . COPD (chronic obstructive pulmonary disease) ([place])   . Type II diabetes mellitus ([place])   . Stroke ([place]) 05/2013    "can't use left arm fully since"  . Arthritis     "legs" (02/24/2014)  . Pneumonia     "5 times back to back recently (just finished antibiiotic) " (02/24/2014)  . Cellulitis and abscess of toe 08/2015    LEFT FOOT     Past Surgical History  Procedure Laterality Date  . Removal of pleural drainage catheter Left 05/03/2014    Procedure: REMOVAL OF PLEURAL DRAINAGE CATHETER;  Surgeon: Ivin Poot, MD;  Location: [place];  Service: Thoracic;  Laterality: Left;  . Foot surgery Left     due to broken foot years ago  . Esophagogastroduodenoscopy N/A 11/02/2013    Procedure: ESOPHAGOGASTRODUODENOSCOPY (EGD);  Surgeon: Cleotis Nipper, MD;  Location: [facility] ENDOSCOPY;  Service: Endoscopy;  Laterality: N/A;  . Vascular surgery      stents to legs  . Cardiac catheterization    . Transurethral resection of prostate    . Femoral artery  stent Left 10/2012  . Chest tube insertion Left 02/24/2014    Procedure: INSERTION PLEURAL DRAINAGE CATHETER;  Surgeon: Ivin Poot, MD;  Location: [place];  Service: Thoracic;  Laterality: Left;  . Esophagogastroduodenoscopy (egd) with propofol N/A 07/06/2014    Procedure: ESOPHAGOGASTRODUODENOSCOPY (EGD) WITH PROPOFOL;  Surgeon: Cleotis Nipper, MD;  Location: [place];  Service: Endoscopy;  Laterality: N/A;  . Lower extremity angiogram N/A 11/06/2012    Procedure: LOWER EXTREMITY ANGIOGRAM;  Surgeon: Laverda Page, MD;  Location: [facility] CATH LAB;  Service: Cardiovascular;  Laterality: N/A;    Family History  Problem Relation Age of Onset  . Cancer Neg Hx   . CAD Neg Hx   . Hypertension Other     Social History:  reports that he quit smoking about 33 years ago. His smoking use included Cigarettes. He has a 105 pack-year smoking history. He has never used smokeless tobacco. He reports that he drinks alcohol. He reports that he does not use illicit drugs.  Allergies: No Known Allergies  Medications: I have reviewed the patient's current medications.  Results for orders placed or performed during the hospital encounter of 09/11/15 (from the past 48 hour(s))  CBC with Differential/Platelet     Status: Abnormal   Collection Time: 09/11/15  5:27 PM  Result Value Ref Range  WBC 6.7 4.0 - 10.5 K/uL   RBC 3.88 (L) 4.22 - 5.81 MIL/uL   Hemoglobin 13.0 13.0 - 17.0 g/dL   HCT 38.4 (L) 39.0 - 52.0 %   MCV 99.0 78.0 - 100.0 fL   MCH 33.5 26.0 - 34.0 pg   MCHC 33.9 30.0 - 36.0 g/dL   RDW 13.5 11.5 - 15.5 %   Platelets 239 150 - 400 K/uL   Neutrophils Relative % 56 %   Neutro Abs 3.8 1.7 - 7.7 K/uL   Lymphocytes Relative 23 %   Lymphs Abs 1.5 0.7 - 4.0 K/uL   Monocytes Relative 18 %   Monocytes Absolute 1.2 (H) 0.1 - 1.0 K/uL   Eosinophils Relative 3 %   Eosinophils Absolute 0.2 0.0 - 0.7 K/uL   Basophils Relative 0 %   Basophils Absolute 0.0 0.0 - 0.1 K/uL  Comprehensive metabolic  panel     Status: Abnormal   Collection Time: 09/11/15  5:27 PM  Result Value Ref Range   Sodium 138 135 - 145 mmol/L   Potassium 5.2 (H) 3.5 - 5.1 mmol/L   Chloride 103 101 - 111 mmol/L   CO2 26 22 - 32 mmol/L   Glucose, Bld 101 (H) 65 - 99 mg/dL   BUN 34 (H) 6 - 20 mg/dL   Creatinine, Ser 0.89 0.61 - 1.24 mg/dL   Calcium 9.4 8.9 - 10.3 mg/dL   Total Protein 7.9 6.5 - 8.1 g/dL   Albumin 3.5 3.5 - 5.0 g/dL   AST 32 15 - 41 U/L   ALT 14 (L) 17 - 63 U/L   Alkaline Phosphatase 73 38 - 126 U/L   Total Bilirubin 1.2 0.3 - 1.2 mg/dL   GFR calc non Af Amer >60 >60 mL/min   GFR calc Af Amer >60 >60 mL/min    Comment: (NOTE) The eGFR has been calculated using the CKD EPI equation. This calculation has not been validated in all clinical situations. eGFR's persistently <60 mL/min signify possible Chronic Kidney Disease.    Anion gap 9 5 - 15  Troponin I     Status: None   Collection Time: 09/11/15  5:27 PM  Result Value Ref Range   Troponin I <0.03 <0.031 ng/mL    Comment:        NO INDICATION OF MYOCARDIAL INJURY.   Protime-INR     Status: Abnormal   Collection Time: 09/11/15  5:27 PM  Result Value Ref Range   Prothrombin Time 20.4 (H) 11.6 - 15.2 seconds   INR 1.75 (H) 0.00 - 1.49  Blood culture (routine x 2)     Status: None (Preliminary result)   Collection Time: 09/11/15  5:27 PM  Result Value Ref Range   Specimen Description BLOOD RIGHT ARM    Special Requests IN PEDIATRIC BOTTLE 4CC    Culture NO GROWTH < 24 HOURS    Report Status PENDING   I-Stat CG4 Lactic Acid, ED     Status: None   Collection Time: 09/11/15  5:53 PM  Result Value Ref Range   Lactic Acid, Venous 1.82 0.5 - 2.0 mmol/L  Lactic acid, plasma     Status: Abnormal   Collection Time: 09/11/15  9:05 PM  Result Value Ref Range   Lactic Acid, Venous 2.1 (HH) 0.5 - 2.0 mmol/L    Comment: CRITICAL RESULT CALLED TO, READ BACK BY AND VERIFIED WITHKalman Drape RN [phone] R   Glucose, capillary      Status:  None   Collection Time: 09/11/15 11:22 PM  Result Value Ref Range   Glucose-Capillary 83 65 - 99 mg/dL   Comment 1 Notify RN    Comment 2 Document in Chart   Protime-INR     Status: Abnormal   Collection Time: 09/12/15  4:06 AM  Result Value Ref Range   Prothrombin Time 22.8 (H) 11.6 - 15.2 seconds   INR 2.03 (H) 0.00 - 1.49  Comprehensive metabolic panel     Status: Abnormal   Collection Time: 09/12/15  4:06 AM  Result Value Ref Range   Sodium 138 135 - 145 mmol/L   Potassium 4.4 3.5 - 5.1 mmol/L   Chloride 106 101 - 111 mmol/L   CO2 28 22 - 32 mmol/L   Glucose, Bld 100 (H) 65 - 99 mg/dL   BUN 31 (H) 6 - 20 mg/dL   Creatinine, Ser 0.93 0.61 - 1.24 mg/dL   Calcium 9.0 8.9 - 10.3 mg/dL   Total Protein 6.6 6.5 - 8.1 g/dL   Albumin 2.9 (L) 3.5 - 5.0 g/dL   AST 17 15 - 41 U/L   ALT 13 (L) 17 - 63 U/L   Alkaline Phosphatase 62 38 - 126 U/L   Total Bilirubin 0.4 0.3 - 1.2 mg/dL   GFR calc non Af Amer >60 >60 mL/min   GFR calc Af Amer >60 >60 mL/min    Comment: (NOTE) The eGFR has been calculated using the CKD EPI equation. This calculation has not been validated in all clinical situations. eGFR's persistently <60 mL/min signify possible Chronic Kidney Disease.    Anion gap 4 (L) 5 - 15  CBC WITH DIFFERENTIAL     Status: Abnormal   Collection Time: 09/12/15  4:06 AM  Result Value Ref Range   WBC 5.1 4.0 - 10.5 K/uL   RBC 3.28 (L) 4.22 - 5.81 MIL/uL   Hemoglobin 10.8 (L) 13.0 - 17.0 g/dL   HCT 32.9 (L) 39.0 - 52.0 %   MCV 100.3 (H) 78.0 - 100.0 fL   MCH 32.9 26.0 - 34.0 pg   MCHC 32.8 30.0 - 36.0 g/dL   RDW 13.4 11.5 - 15.5 %   Platelets 202 150 - 400 K/uL   Neutrophils Relative % 35 %   Neutro Abs 1.8 1.7 - 7.7 K/uL   Lymphocytes Relative 36 %   Lymphs Abs 1.9 0.7 - 4.0 K/uL   Monocytes Relative 24 %   Monocytes Absolute 1.2 (H) 0.1 - 1.0 K/uL   Eosinophils Relative 4 %   Eosinophils Absolute 0.2 0.0 - 0.7 K/uL   Basophils Relative 1 %   Basophils Absolute  0.0 0.0 - 0.1 K/uL  Heparin level (unfractionated)     Status: Abnormal   Collection Time: 09/12/15  4:06 AM  Result Value Ref Range   Heparin Unfractionated <0.10 (L) 0.30 - 0.70 IU/mL    Comment:        IF HEPARIN RESULTS ARE BELOW EXPECTED VALUES, AND PATIENT DOSAGE HAS BEEN CONFIRMED, SUGGEST FOLLOW UP TESTING OF ANTITHROMBIN III LEVELS.   Lactic acid, plasma     Status: None   Collection Time: 09/12/15  4:06 AM  Result Value Ref Range   Lactic Acid, Venous 0.7 0.5 - 2.0 mmol/L  Uric acid     Status: None   Collection Time: 09/12/15  4:06 AM  Result Value Ref Range   Uric Acid, Serum 4.4 4.4 - 7.6 mg/dL  Glucose, capillary     Status: None  Collection Time: 09/12/15  8:19 AM  Result Value Ref Range   Glucose-Capillary 76 65 - 99 mg/dL  Glucose, capillary     Status: None   Collection Time: 09/12/15 12:01 PM  Result Value Ref Range   Glucose-Capillary 92 65 - 99 mg/dL  Heparin level (unfractionated)     Status: None   Collection Time: 09/12/15  4:30 PM  Result Value Ref Range   Heparin Unfractionated 0.34 0.30 - 0.70 IU/mL    Comment:        IF HEPARIN RESULTS ARE BELOW EXPECTED VALUES, AND PATIENT DOSAGE HAS BEEN CONFIRMED, SUGGEST FOLLOW UP TESTING OF ANTITHROMBIN III LEVELS.   Glucose, capillary     Status: Abnormal   Collection Time: 09/12/15  5:03 PM  Result Value Ref Range   Glucose-Capillary 112 (H) 65 - 99 mg/dL    Dg Chest 2 View  09/11/2015  CLINICAL DATA:  Hypertension EXAM: CHEST  2 VIEW COMPARISON:  July 21, 2015 FINDINGS: There is scarring in the lung bases bilaterally. The somewhat nodular appearance in the left base on the prior study is no longer appreciable. There is a calcified granuloma the right base. Lungs are hyperexpanded. There is no frank edema or consolidation. The heart is upper normal in size. The pulmonary vascular is within normal limits. No adenopathy. No bone lesions. IMPRESSION: Bibasilar lung scarring. Calcified granuloma right  base. Lungs somewhat hyperexpanded. No edema or consolidation. Electronically Signed   By: Lowella Grip III M.D.   On: 09/11/2015 18:05   Ct Angio Chest Pe W/cm &/or Wo Cm  09/12/2015  CLINICAL DATA:  Hypoxia.  Foot swelling. EXAM: CT ANGIOGRAPHY CHEST WITH CONTRAST TECHNIQUE: Multidetector CT imaging of the chest was performed using the standard protocol during bolus administration of intravenous contrast. Multiplanar CT image reconstructions and MIPs were obtained to evaluate the vascular anatomy. CONTRAST:  30m OMNIPAQUE IOHEXOL 350 MG/ML SOLN COMPARISON:  06/23/2015 FINDINGS: THORACIC INLET/BODY WALL: No acute abnormality. MEDIASTINUM: Chronic cardiomegaly without pericardial effusion. Essentially non-opacified systemic arterial tree without indication of acute disease. Extensive atherosclerosis, including the coronary arteries. There is chronic poor opacification of bilateral lower lobe segmental and subsegmental branches with eccentric filling defects consistent with chronic emboli. Poorly aerated and scarred lower lobes may contribute to poor opacification of these vessels. There is also chronic underfilling and collapse of medial segment right middle lobe pulmonary arterial branches. Pulmonary hypertension with main pulmonary artery diameter of 4 cm. No acute pulmonary embolism is identified. LUNG WINDOWS: Emphysema and chronic bronchitis. There is extensive airway debris within the right lower airways and patchy mucoid impaction within left lower lobe bronchi. Bilateral lower lobe atelectasis or scarring, improved on the left since prior. No edema, effusion, or pneumothorax. UPPER ABDOMEN: No acute findings. OSSEOUS: No acute finding.  Remote left-sided rib fractures. Review of the MIP images confirms the above findings. IMPRESSION: 1. Extensive debris with the right lower lobe airways, suspect recurrent aspiration given similar lower lobe findings on September 2016 chest CTs. 2. Remote bilateral  pulmonary embolism with poor lower lobe blood flow and pulmonary hypertension. No acute pulmonary embolism identified. 3. Emphysema. Electronically Signed   By: JMonte FantasiaM.D.   On: 09/12/2015 02:54   Mr Foot Left Wo Contrast  09/12/2015  CLINICAL DATA:  Left great toe infection following removal of toenail 3 weeks ago. Increasing pain and discoloration despite antibiotics. EXAM: MRI OF THE LEFT FOREFOOT WITHOUT CONTRAST TECHNIQUE: Multiplanar, multisequence MR imaging was performed. No intravenous contrast was administered.  COMPARISON:  Radiographs 09/11/2015. FINDINGS: Decreased T1 and increased T2 marrow signal is present throughout the distal phalanx of the great toe. The cortex appears mildly thinned and ill-defined on the coronal T1 weighted images. There is no frank cortical destruction. There is no interphalangeal joint effusion. The proximal phalanx and first metatarsal appear normal. The additional toes and metatarsals demonstrate no significant findings. There is no evidence of soft tissue abscess. There is mild dorsal subcutaneous edema. There is also mild T2 hyperintensity throughout the forefoot musculature, likely secondary to underlying diabetes. IMPRESSION: 1. Marrow changes within the distal phalanx of the great toe suspicious for osteomyelitis. There are possible early cortical changes in the tuft. 2. No evidence of soft tissue abscess or septic joint. Electronically Signed   By: Richardean Sale M.D.   On: 09/12/2015 10:54   Dg Chest Port 1 View  09/11/2015  CLINICAL DATA:  Decreasing oxygen sats for 1 hour EXAM: PORTABLE CHEST 1 VIEW COMPARISON:  09/11/2015 FINDINGS: Cardiac silhouette within normal limits. Calcified 5 mm granuloma right lower lobe stable increased interstitial markings left lower lobe stable from film performed 1731 hours today. IMPRESSION: Stable appearance from film performed earlier today, in turn stable from 07/21/2015. Electronically Signed   By: Skipper Cliche M.D.   On: 09/11/2015 21:21   Dg Foot 2 Views Left  09/11/2015  CLINICAL DATA:  81 year old with recent removal of the toe nail from the left great toe, presenting with pain and swelling involving the great toe, the lateral foot, extending into the ankle. EXAM: LEFT FOOT - 2 VIEW COMPARISON:  09/04/2015, 12/16/2007. FINDINGS: No evidence of acute or subacute fracture or dislocation. Hallux valgus. Soft tissue swelling involving the great toe and the lateral foot, not significantly changed since the examination 1 week ago. No evidence of underlying osteomyelitis. Generalized osseous demineralization. Degenerative changes involving the midfoot and the IP joints of the toes. No new abnormalities. IMPRESSION: No acute or subacute osseous abnormality. Electronically Signed   By: Evangeline Dakin M.D.   On: 09/11/2015 18:05    Review of Systems  Constitutional: Negative.   HENT: Negative.   Eyes: Negative.   Respiratory: Negative.   Cardiovascular: Negative.   Gastrointestinal: Negative.   Genitourinary: Negative.   Musculoskeletal: Negative.   Skin: Negative.   Neurological: Negative.   Endo/Heme/Allergies: Negative.   Psychiatric/Behavioral: Negative.    Blood pressure 105/60, pulse 80, temperature 97.4 F (36.3 C), temperature source Oral, resp. rate 15, height 5' 0.13" (1.527 m), weight 61.1 kg (134 lb 11.2 oz), SpO2 99 %. Physical Exam  Constitutional: He appears well-developed.  HENT:  Head: Normocephalic.  Eyes: Pupils are equal, round, and reactive to light.  Neck: Normal range of motion.  Cardiovascular: Normal rate.   Respiratory: Effort normal.  Neurological: He is alert.  Skin: Skin is warm.  Psychiatric: He has a normal mood and affect.  left toe dry gangrene to mid toe - dp 1/4 - no drainage or fluctuance - ankle df/pf ok  Assessment/Plan: Left great toe osteo - plan amputation - hold heparin - risks/benefits discussed - all ? answered. MRI scan is consistent with  great toe osteomyelitis on the left-hand side plan great toe amputation likely through the MTP joint in order to have enough soft tissue to cover the amputation stump. He does have erythema and tissue involvement involving about half of the toe. Patient understands risks and benefits all questions answered continue with her preoperative IV antibiotics postsurgery  [name] 09/12/2015, 5:45  PM      

## 2015-09-12 NOTE — Progress Notes (Signed)
CRITICAL VALUE ALERT  Critical value received:  Lactic acid 2.1  Date of notification:  09/11/2015  Time of notification:  unsure  Critical value read back:Yes.    Nurse who received alert:  Arthor Captain, LPN  Unsure when Verdene Lennert, LPN notified MD the first time.  MD notified (2nd page): by Stephanie Coup, RN at 12:10 am  Responding MD:  Dr. Hal Hope  Time MD responded:  12:15 am

## 2015-09-12 NOTE — Progress Notes (Signed)
PROGRESS NOTE  [name] [id number] DOB: Mar 02, 1928 DOA: 09/11/2015 PCP: [name], MD  HPI/Recap of past 24 hours:  Demented elderly male , NAD, denies pain, on oxygen supplement, not in respiratory distress, denies chest pain, RN reported patient does has occasionally nonproductive cough.  Assessment/Plan: Principal Problem:   Cellulitis of toe of left foot Active Problems:   COPD (chronic obstructive pulmonary disease) (HCC)   Pulmonary embolism on right (HCC)   Cellulitis   Acute respiratory failure with hypoxia (HCC)  Left great toe osteomyelitis: on iv abx, orthopedic Dr. Marlou Sa plan to take patient to OR at 10:30pm tonight, hold heparin drip 6hrs prior to surgery, restart post op. Continue on vanc/zosyn.  Decreased Left pedal pulse: ABI pending  Acute on chronic Hypoxic respiratory failure ( on home o2 prn) currently on 3liter oxygen supplement: cta with background emphysema, chronic PE though no acute PE, CTA also showed right lower lobe debris concerns for aspiration.currently patient is not in respiratory distress, on abx, speech eval ordered, continue on abx.  Sub therapeutic INR, currently coumadin held due to planned surgery, he was started on heparin drip since admission, heparin drip held for planned surgery, restart post op.  Chronic afib: rate controlled on current regimen. On chronic anticoagulation.  H/o PE: cta with chronic pe. On chronic anticoagualtion.  Non insulin dependent diabetes: metformin held, a1c pending, on ssi here.  Copd: stable at baseline, no wheezing.  Dementia: likely vascular dementia, per wife, patient has been having memory issues since his stroke. Per wife he is at baseline.   Code Status: full, verified with patient's wife  Family Communication: patient in room and patient's wife over the phone  Disposition Plan: remain in the hospital   Consultants:  orthopedics Dr.  Marlou Sa  Procedures:  pending  Antibiotics:  vanc/zosyn   Objective: BP 105/60 mmHg  Pulse 80  Temp(Src) 97.4 F (36.3 C) (Oral)  Resp 15  Ht 5' 0.13" (1.527 m)  Wt 134 lb 11.2 oz (61.1 kg)  BMI 26.20 kg/m2  SpO2 99%  Intake/Output Summary (Last 24 hours) at 09/12/15 1707 Last data filed at 09/12/15 1703  Gross per 24 hour  Intake  207.4 ml  Output   1075 ml  Net -867.6 ml   Filed Weights   09/11/15 2026 09/12/15 0552  Weight: 137 lb 2 oz (62.2 kg) 134 lb 11.2 oz (61.1 kg)    Exam:   General:  Frail elderly male, NAD, follow command.  Cardiovascular: IRRR  Respiratory: diminished right basis, otherwise , no wheezing, no rales, no rhonchi  Abdomen: Soft/ND/NT, positive BS  Musculoskeletal: left great toe and second toe erythema, nontender, decreased sensation, decreased pedal pulse.  Neuro: oriented to person and place, not to time, impaired memory, per wife this is baseline  Data Reviewed: Basic Metabolic Panel:  Recent Labs Lab 09/11/15 1727 09/12/15 0406  NA 138 138  K 5.2* 4.4  CL 103 106  CO2 26 28  GLUCOSE 101* 100*  BUN 34* 31*  CREATININE 0.89 0.93  CALCIUM 9.4 9.0   Liver Function Tests:  Recent Labs Lab 09/11/15 1727 09/12/15 0406  AST 32 17  ALT 14* 13*  ALKPHOS 73 62  BILITOT 1.2 0.4  PROT 7.9 6.6  ALBUMIN 3.5 2.9*   No results for input(s): LIPASE, AMYLASE in the last 168 hours. No results for input(s): AMMONIA in the last 168 hours. CBC:  Recent Labs Lab 09/11/15 1727 09/12/15 0406  WBC 6.7 5.1  NEUTROABS  3.8 1.8  HGB 13.0 10.8*  HCT 38.4* 32.9*  MCV 99.0 100.3*  PLT 239 202   Cardiac Enzymes:    Recent Labs Lab 09/11/15 1727  TROPONINI <0.03   BNP (last 3 results) No results for input(s): BNP in the last 8760 hours.  ProBNP (last 3 results) No results for input(s): PROBNP in the last 8760 hours.  CBG:  Recent Labs Lab 09/11/15 2322 09/12/15 0819 09/12/15 1201  GLUCAP 83 76 92    Recent  Results (from the past 240 hour(s))  Blood culture (routine x 2)     Status: None (Preliminary result)   Collection Time: 09/11/15  5:27 PM  Result Value Ref Range Status   Specimen Description BLOOD RIGHT ARM  Final   Special Requests IN PEDIATRIC BOTTLE 4CC  Final   Culture NO GROWTH < 24 HOURS  Final   Report Status PENDING  Incomplete     Studies: Dg Chest 2 View  09/11/2015  CLINICAL DATA:  Hypertension EXAM: CHEST  2 VIEW COMPARISON:  July 21, 2015 FINDINGS: There is scarring in the lung bases bilaterally. The somewhat nodular appearance in the left base on the prior study is no longer appreciable. There is a calcified granuloma the right base. Lungs are hyperexpanded. There is no frank edema or consolidation. The heart is upper normal in size. The pulmonary vascular is within normal limits. No adenopathy. No bone lesions. IMPRESSION: Bibasilar lung scarring. Calcified granuloma right base. Lungs somewhat hyperexpanded. No edema or consolidation. Electronically Signed   By: Lowella Grip III M.D.   On: 09/11/2015 18:05   Ct Angio Chest Pe W/cm &/or Wo Cm  09/12/2015  CLINICAL DATA:  Hypoxia.  Foot swelling. EXAM: CT ANGIOGRAPHY CHEST WITH CONTRAST TECHNIQUE: Multidetector CT imaging of the chest was performed using the standard protocol during bolus administration of intravenous contrast. Multiplanar CT image reconstructions and MIPs were obtained to evaluate the vascular anatomy. CONTRAST:  35mL OMNIPAQUE IOHEXOL 350 MG/ML SOLN COMPARISON:  06/23/2015 FINDINGS: THORACIC INLET/BODY WALL: No acute abnormality. MEDIASTINUM: Chronic cardiomegaly without pericardial effusion. Essentially non-opacified systemic arterial tree without indication of acute disease. Extensive atherosclerosis, including the coronary arteries. There is chronic poor opacification of bilateral lower lobe segmental and subsegmental branches with eccentric filling defects consistent with chronic emboli. Poorly aerated  and scarred lower lobes may contribute to poor opacification of these vessels. There is also chronic underfilling and collapse of medial segment right middle lobe pulmonary arterial branches. Pulmonary hypertension with main pulmonary artery diameter of 4 cm. No acute pulmonary embolism is identified. LUNG WINDOWS: Emphysema and chronic bronchitis. There is extensive airway debris within the right lower airways and patchy mucoid impaction within left lower lobe bronchi. Bilateral lower lobe atelectasis or scarring, improved on the left since prior. No edema, effusion, or pneumothorax. UPPER ABDOMEN: No acute findings. OSSEOUS: No acute finding.  Remote left-sided rib fractures. Review of the MIP images confirms the above findings. IMPRESSION: 1. Extensive debris with the right lower lobe airways, suspect recurrent aspiration given similar lower lobe findings on September 2016 chest CTs. 2. Remote bilateral pulmonary embolism with poor lower lobe blood flow and pulmonary hypertension. No acute pulmonary embolism identified. 3. Emphysema. Electronically Signed   By: Monte Fantasia M.D.   On: 09/12/2015 02:54   Mr Foot Left Wo Contrast  09/12/2015  CLINICAL DATA:  Left great toe infection following removal of toenail 3 weeks ago. Increasing pain and discoloration despite antibiotics. EXAM: MRI OF THE LEFT FOREFOOT WITHOUT  CONTRAST TECHNIQUE: Multiplanar, multisequence MR imaging was performed. No intravenous contrast was administered. COMPARISON:  Radiographs 09/11/2015. FINDINGS: Decreased T1 and increased T2 marrow signal is present throughout the distal phalanx of the great toe. The cortex appears mildly thinned and ill-defined on the coronal T1 weighted images. There is no frank cortical destruction. There is no interphalangeal joint effusion. The proximal phalanx and first metatarsal appear normal. The additional toes and metatarsals demonstrate no significant findings. There is no evidence of soft tissue  abscess. There is mild dorsal subcutaneous edema. There is also mild T2 hyperintensity throughout the forefoot musculature, likely secondary to underlying diabetes. IMPRESSION: 1. Marrow changes within the distal phalanx of the great toe suspicious for osteomyelitis. There are possible early cortical changes in the tuft. 2. No evidence of soft tissue abscess or septic joint. Electronically Signed   By: Richardean Sale M.D.   On: 09/12/2015 10:54   Dg Chest Port 1 View  09/11/2015  CLINICAL DATA:  Decreasing oxygen sats for 1 hour EXAM: PORTABLE CHEST 1 VIEW COMPARISON:  09/11/2015 FINDINGS: Cardiac silhouette within normal limits. Calcified 5 mm granuloma right lower lobe stable increased interstitial markings left lower lobe stable from film performed 1731 hours today. IMPRESSION: Stable appearance from film performed earlier today, in turn stable from 07/21/2015. Electronically Signed   By: Skipper Cliche M.D.   On: 09/11/2015 21:21   Dg Foot 2 Views Left  09/11/2015  CLINICAL DATA:  81 year old with recent removal of the toe nail from the left great toe, presenting with pain and swelling involving the great toe, the lateral foot, extending into the ankle. EXAM: LEFT FOOT - 2 VIEW COMPARISON:  09/04/2015, 12/16/2007. FINDINGS: No evidence of acute or subacute fracture or dislocation. Hallux valgus. Soft tissue swelling involving the great toe and the lateral foot, not significantly changed since the examination 1 week ago. No evidence of underlying osteomyelitis. Generalized osseous demineralization. Degenerative changes involving the midfoot and the IP joints of the toes. No new abnormalities. IMPRESSION: No acute or subacute osseous abnormality. Electronically Signed   By: Evangeline Dakin M.D.   On: 09/11/2015 18:05    Scheduled Meds: . albuterol  2.5 mg Nebulization QID  . allopurinol  100 mg Oral BID  . antiseptic oral rinse  7 mL Mouth Rinse BID  . carvedilol  6.25 mg Oral BID WC  .  cilostazol  50 mg Oral BID  . colchicine  0.6 mg Oral BID  . famotidine  20 mg Oral BID  . insulin aspart  0-9 Units Subcutaneous TID WC  . magnesium oxide  200 mg Oral Daily  . mirtazapine  15 mg Oral QHS  . pantoprazole  40 mg Oral BID  . piperacillin-tazobactam (ZOSYN)  IV  3.375 g Intravenous Q8H  . simvastatin  40 mg Oral QPM  . sodium chloride  3 mL Intravenous Q12H  . tiotropium  18 mcg Inhalation Daily  . vancomycin  1,250 mg Intravenous Q24H  . vitamin B-12  1,000 mcg Oral Daily    Continuous Infusions:     Time spent: 53mins  Roswell Ndiaye MD, PhD  [facility] Pager [phone]. If 7PM-7AM, please contact night-coverage at [email or web address], password [facility] 09/12/2015, 5:07 PM  LOS: 1 day

## 2015-09-12 NOTE — Progress Notes (Addendum)
VASCULAR LAB PRELIMINARY  Left lower extremity venous duplex = No evidence of acute DVT or baker's cyst. Focal calcific thrombus is noted in the left popliteal vein.    Right brachial pressure is significantly lower (34 mmHg) compared to Left.  ABI completed RIGHT    LEFT    PRESSURE WAVEFORM  PRESSURE WAVEFORM  BRACHIAL 111 Bi BRACHIAL 146 Bi  DP   DP    AT 254 mono AT 244 mono  PT 122 Damp mono PT 78 Damp mono  PER   PER    GREAT TOE 40 NA GREAT TOE Unable to obtain NA    RIGHT LEFT  TBI 0.27 Unable to obtain due to severely dampened PPG waveform  ABI Non compressible Non compressible     Landry Mellow, RDMS, RVT  09/12/2015, 2:55 PM

## 2015-09-12 NOTE — Anesthesia Postprocedure Evaluation (Signed)
Anesthesia Post Note  Patient: [name]  Procedure(s) Performed: Procedure(s) (LRB): LEFT GREAT TOE AMPUTATION (Left)  Patient location during evaluation: PACU Anesthesia Type: MAC Level of consciousness: awake Pain management: pain level controlled Respiratory status: spontaneous breathing Cardiovascular status: blood pressure returned to baseline Anesthetic complications: no    Last Vitals:  Filed Vitals:   09/12/15 2320 09/12/15 2334  BP: 108/72 124/63  Pulse: 70   Temp:  36.4 C  Resp: 14     Last Pain:  Filed Vitals:   09/12/15 2335  PainSc: 0-No pain    LLE Motor Response: Purposeful movement;Responds to commands (09/12/15 2334) LLE Sensation: No pain;No numbness;No tingling (09/12/15 2334)          EDWARDS,Arend Bahl

## 2015-09-13 ENCOUNTER — Encounter (HOSPITAL_COMMUNITY): Payer: Self-pay | Admitting: Orthopedic Surgery

## 2015-09-13 DIAGNOSIS — L089 Local infection of the skin and subcutaneous tissue, unspecified: Secondary | ICD-10-CM | POA: Diagnosis present

## 2015-09-13 DIAGNOSIS — M869 Osteomyelitis, unspecified: Secondary | ICD-10-CM

## 2015-09-13 DIAGNOSIS — L03032 Cellulitis of left toe: Secondary | ICD-10-CM

## 2015-09-13 LAB — TSH: TSH: 1.847 u[IU]/mL (ref 0.350–4.500)

## 2015-09-13 LAB — MAGNESIUM: MAGNESIUM: 1.9 mg/dL (ref 1.7–2.4)

## 2015-09-13 LAB — GLUCOSE, CAPILLARY
GLUCOSE-CAPILLARY: 113 mg/dL — AB (ref 65–99)
GLUCOSE-CAPILLARY: 141 mg/dL — AB (ref 65–99)
GLUCOSE-CAPILLARY: 84 mg/dL (ref 65–99)
Glucose-Capillary: 73 mg/dL (ref 65–99)
Glucose-Capillary: 133 mg/dL — ABNORMAL HIGH (ref 65–99)

## 2015-09-13 LAB — CBC
HCT: 33 % — ABNORMAL LOW (ref 39.0–52.0)
HEMOGLOBIN: 10.8 g/dL — AB (ref 13.0–17.0)
MCH: 32.7 pg (ref 26.0–34.0)
MCHC: 32.7 g/dL (ref 30.0–36.0)
MCV: 100 fL (ref 78.0–100.0)
Platelets: 191 10*3/uL (ref 150–400)
RBC: 3.3 MIL/uL — AB (ref 4.22–5.81)
RDW: 13.4 % (ref 11.5–15.5)
WBC: 4.8 10*3/uL (ref 4.0–10.5)

## 2015-09-13 LAB — BASIC METABOLIC PANEL
Anion gap: 8 (ref 5–15)
BUN: 27 mg/dL — AB (ref 6–20)
CHLORIDE: 106 mmol/L (ref 101–111)
CO2: 24 mmol/L (ref 22–32)
Calcium: 8.6 mg/dL — ABNORMAL LOW (ref 8.9–10.3)
Creatinine, Ser: 1.06 mg/dL (ref 0.61–1.24)
GFR calc Af Amer: >60 mL/min (ref >60)
GLUCOSE: 81 mg/dL (ref 65–99)
POTASSIUM: 3.9 mmol/L (ref 3.5–5.1)
Sodium: 138 mmol/L (ref 135–145)

## 2015-09-13 LAB — PROTIME-INR
INR: 1.88 — ABNORMAL HIGH (ref 0.00–1.49)
Prothrombin Time: 21.5 seconds — ABNORMAL HIGH (ref 11.6–15.2)

## 2015-09-13 MED ORDER — ONDANSETRON HCL 4 MG PO TABS: 4.0000 mg | ORAL_TABLET | Freq: Four times a day (QID) | ORAL | Status: DC | PRN

## 2015-09-13 MED ORDER — METOCLOPRAMIDE HCL 5 MG/ML IJ SOLN: 5.0000 mg | Freq: Three times a day (TID) | INTRAMUSCULAR | Status: DC | PRN

## 2015-09-13 MED ORDER — METFORMIN HCL 500 MG PO TABS
500.0000 mg | ORAL_TABLET | Freq: Two times a day (BID) | ORAL | Status: DC
  Administered 2015-09-13 – 2015-09-14 (×2): 500 mg via ORAL
  Filled 2015-09-13 (×2): qty 1

## 2015-09-13 MED ORDER — ACETAMINOPHEN 325 MG PO TABS: 650.0000 mg | ORAL_TABLET | Freq: Four times a day (QID) | ORAL | Status: DC | PRN

## 2015-09-13 MED ORDER — METOCLOPRAMIDE HCL 10 MG PO TABS: 5.0000 mg | ORAL_TABLET | Freq: Three times a day (TID) | ORAL | Status: DC | PRN

## 2015-09-13 MED ORDER — WARFARIN SODIUM 5 MG PO TABS
5.0000 mg | ORAL_TABLET | Freq: Every day | ORAL | Status: DC
  Administered 2015-09-13: 5 mg via ORAL
  Filled 2015-09-13: qty 1

## 2015-09-13 MED ORDER — ACETAMINOPHEN 650 MG RE SUPP: 650.0000 mg | Freq: Four times a day (QID) | RECTAL | Status: DC | PRN

## 2015-09-13 MED ORDER — ONDANSETRON HCL 4 MG/2ML IJ SOLN: 4.0000 mg | Freq: Four times a day (QID) | INTRAMUSCULAR | Status: DC | PRN

## 2015-09-13 MED ORDER — HEPARIN (PORCINE) IN NACL 100-0.45 UNIT/ML-% IJ SOLN
1100.0000 [IU]/h | INTRAMUSCULAR | Status: DC
  Administered 2015-09-13 (×2): 1100 [IU]/h via INTRAVENOUS
  Filled 2015-09-13: qty 250

## 2015-09-13 MED ORDER — WARFARIN - PHARMACIST DOSING INPATIENT: Freq: Every day | Status: DC

## 2015-09-13 MED ORDER — DOXYCYCLINE HYCLATE 100 MG PO TABS
100.0000 mg | ORAL_TABLET | Freq: Two times a day (BID) | ORAL | Status: DC
  Administered 2015-09-13 – 2015-09-14 (×3): 100 mg via ORAL
  Filled 2015-09-13 (×3): qty 1

## 2015-09-13 NOTE — Progress Notes (Signed)
[facility] PROGRESS NOTE  [name] [id number] DOB: May 20, 1928 DOA: 09/11/2015 PCP: [name], MD  Assessment/Plan: 1. Left great toe osteomyelitis. -Mr. [place] presenting with complaints of left great to pain, further workup with MRI of foot on 09/12/2015 that revealed marrow changes within the distal phalanx of great toe suspicious for osteomyelitis. -He was seen and evaluated by orthopedic surgery, taken to the operating room on 09/12/2015 undergoing amputation of left great toe. Procedure was performed by Dr.Dean of orthopedic surgery.  -Will discontinue broad-spectrum IV antibiotic therapy, start doxycycline 100 mg by mouth twice a day  2.  Chronic atrial fibrillation -CHADSVasc score of 3 -Patient's Coumadin was held for anticipated surgery and placed on IV heparin. -Will restart Coumadin, with discontinuation of IV heparin, pharmacy consulted for transition. -Continued carvedilol 6.25 mg by mouth twice a day  3. History of pulmonary embolism -Continue anticoagulation, stopping IV heparin and transitioning to Coumadin today.  4.  Diabetes mellitus   -Blood sugars are stable, continue sling scale coverage -Dye advanced to car modified diet. -Will restart metformin 500 mg by mouth twice a day  5.  Dyslipidemia -Continue simvastatin   Code Status: Full Code Family Communication: I spoke with family at bedside Disposition Plan: Transitoning to oral anticoagulation    Consultants:  Orthopedic Surgery  Procedures:  Left great toe amputation, procedure performed on 09/12/1999 610  Antibiotics:  Vancomycin discontinued on 09/13/2015  Zosyn discontinued on 09/13/2015  Doxycycline started until 21 2016  HPI/Subjective: Patient complaining of pain at amputation site  Objective: Filed Vitals:   09/13/15 0534 09/13/15 1023  BP: 121/53 118/65  Pulse: 60 70  Temp: 98.4 F (36.9 C) 97.3 F (36.3 C)  Resp: 16 18    Intake/Output Summary (Last  24 hours) at 09/13/15 1446 Last data filed at 09/13/15 1143  Gross per 24 hour  Intake    350 ml  Output    340 ml  Net     10 ml   Filed Weights   09/11/15 2026 09/12/15 0552 09/13/15 0529  Weight: 62.2 kg (137 lb 2 oz) 61.1 kg (134 lb 11.2 oz) 62.596 kg (138 lb)    Exam:   General:  Patient is awake alert, no acute distress  Cardiovascular: Regular rate and rhythm normal S1-S2  Respiratory: Normal respiratory effort  Abdomen: Soft nontender nondistended  Musculoskeletal: Left foot is bandaged, status post" of left great toe  Data Reviewed: Basic Metabolic Panel:  Recent Labs Lab 09/11/15 1727 09/12/15 0406 09/13/15 0630  NA 138 138 138  K 5.2* 4.4 3.9  CL 103 106 106  CO2 26 28 24   GLUCOSE 101* 100* 81  BUN 34* 31* 27*  CREATININE 0.89 0.93 1.06  CALCIUM 9.4 9.0 8.6*  MG  --   --  1.9   Liver Function Tests:  Recent Labs Lab 09/11/15 1727 09/12/15 0406  AST 32 17  ALT 14* 13*  ALKPHOS 73 62  BILITOT 1.2 0.4  PROT 7.9 6.6  ALBUMIN 3.5 2.9*   No results for input(s): LIPASE, AMYLASE in the last 168 hours. No results for input(s): AMMONIA in the last 168 hours. CBC:  Recent Labs Lab 09/11/15 1727 09/12/15 0406 09/13/15 0630  WBC 6.7 5.1 4.8  NEUTROABS 3.8 1.8  --   HGB 13.0 10.8* 10.8*  HCT 38.4* 32.9* 33.0*  MCV 99.0 100.3* 100.0  PLT 239 202 191   Cardiac Enzymes:  Recent Labs Lab 09/11/15 1727  TROPONINI <0.03   BNP (last  3 results) No results for input(s): BNP in the last 8760 hours.  ProBNP (last 3 results) No results for input(s): PROBNP in the last 8760 hours.  CBG:  Recent Labs Lab 09/12/15 1703 09/12/15 2306 09/13/15 0011 09/13/15 0746 09/13/15 1143  GLUCAP 112* 85 84 73 133*    Recent Results (from the past 240 hour(s))  Blood culture (routine x 2)     Status: None (Preliminary result)   Collection Time: 09/11/15  5:27 PM  Result Value Ref Range Status   Specimen Description BLOOD RIGHT ARM  Final   Special  Requests IN PEDIATRIC BOTTLE 4CC  Final   Culture NO GROWTH 2 DAYS  Final   Report Status PENDING  Incomplete  Surgical pcr screen     Status: None   Collection Time: 09/12/15  8:55 PM  Result Value Ref Range Status   MRSA, PCR NEGATIVE NEGATIVE Final   Staphylococcus aureus NEGATIVE NEGATIVE Final    Comment:        The Xpert SA Assay (FDA approved for NASAL specimens in patients over 96 years of age), is one component of a comprehensive surveillance program.  Test performance has been validated by [facility] for patients greater than or equal to 33 year old. It is not intended to diagnose infection nor to guide or monitor treatment.      Studies: Dg Chest 2 View  09/11/2015  CLINICAL DATA:  Hypertension EXAM: CHEST  2 VIEW COMPARISON:  July 21, 2015 FINDINGS: There is scarring in the lung bases bilaterally. The somewhat nodular appearance in the left base on the prior study is no longer appreciable. There is a calcified granuloma the right base. Lungs are hyperexpanded. There is no frank edema or consolidation. The heart is upper normal in size. The pulmonary vascular is within normal limits. No adenopathy. No bone lesions. IMPRESSION: Bibasilar lung scarring. Calcified granuloma right base. Lungs somewhat hyperexpanded. No edema or consolidation. Electronically Signed   By: Lowella Grip III M.D.   On: 09/11/2015 18:05   Ct Angio Chest Pe W/cm &/or Wo Cm  09/12/2015  CLINICAL DATA:  Hypoxia.  Foot swelling. EXAM: CT ANGIOGRAPHY CHEST WITH CONTRAST TECHNIQUE: Multidetector CT imaging of the chest was performed using the standard protocol during bolus administration of intravenous contrast. Multiplanar CT image reconstructions and MIPs were obtained to evaluate the vascular anatomy. CONTRAST:  65mL OMNIPAQUE IOHEXOL 350 MG/ML SOLN COMPARISON:  06/23/2015 FINDINGS: THORACIC INLET/BODY WALL: No acute abnormality. MEDIASTINUM: Chronic cardiomegaly without pericardial effusion.  Essentially non-opacified systemic arterial tree without indication of acute disease. Extensive atherosclerosis, including the coronary arteries. There is chronic poor opacification of bilateral lower lobe segmental and subsegmental branches with eccentric filling defects consistent with chronic emboli. Poorly aerated and scarred lower lobes may contribute to poor opacification of these vessels. There is also chronic underfilling and collapse of medial segment right middle lobe pulmonary arterial branches. Pulmonary hypertension with main pulmonary artery diameter of 4 cm. No acute pulmonary embolism is identified. LUNG WINDOWS: Emphysema and chronic bronchitis. There is extensive airway debris within the right lower airways and patchy mucoid impaction within left lower lobe bronchi. Bilateral lower lobe atelectasis or scarring, improved on the left since prior. No edema, effusion, or pneumothorax. UPPER ABDOMEN: No acute findings. OSSEOUS: No acute finding.  Remote left-sided rib fractures. Review of the MIP images confirms the above findings. IMPRESSION: 1. Extensive debris with the right lower lobe airways, suspect recurrent aspiration given similar lower lobe findings on September 2016  chest CTs. 2. Remote bilateral pulmonary embolism with poor lower lobe blood flow and pulmonary hypertension. No acute pulmonary embolism identified. 3. Emphysema. Electronically Signed   By: Monte Fantasia M.D.   On: 09/12/2015 02:54   Mr Foot Left Wo Contrast  09/12/2015  CLINICAL DATA:  Left great toe infection following removal of toenail 3 weeks ago. Increasing pain and discoloration despite antibiotics. EXAM: MRI OF THE LEFT FOREFOOT WITHOUT CONTRAST TECHNIQUE: Multiplanar, multisequence MR imaging was performed. No intravenous contrast was administered. COMPARISON:  Radiographs 09/11/2015. FINDINGS: Decreased T1 and increased T2 marrow signal is present throughout the distal phalanx of the great toe. The cortex appears  mildly thinned and ill-defined on the coronal T1 weighted images. There is no frank cortical destruction. There is no interphalangeal joint effusion. The proximal phalanx and first metatarsal appear normal. The additional toes and metatarsals demonstrate no significant findings. There is no evidence of soft tissue abscess. There is mild dorsal subcutaneous edema. There is also mild T2 hyperintensity throughout the forefoot musculature, likely secondary to underlying diabetes. IMPRESSION: 1. Marrow changes within the distal phalanx of the great toe suspicious for osteomyelitis. There are possible early cortical changes in the tuft. 2. No evidence of soft tissue abscess or septic joint. Electronically Signed   By: Richardean Sale M.D.   On: 09/12/2015 10:54   Dg Chest Port 1 View  09/11/2015  CLINICAL DATA:  Decreasing oxygen sats for 1 hour EXAM: PORTABLE CHEST 1 VIEW COMPARISON:  09/11/2015 FINDINGS: Cardiac silhouette within normal limits. Calcified 5 mm granuloma right lower lobe stable increased interstitial markings left lower lobe stable from film performed 1731 hours today. IMPRESSION: Stable appearance from film performed earlier today, in turn stable from 07/21/2015. Electronically Signed   By: Skipper Cliche M.D.   On: 09/11/2015 21:21   Dg Foot 2 Views Left  09/11/2015  CLINICAL DATA:  81 year old with recent removal of the toe nail from the left great toe, presenting with pain and swelling involving the great toe, the lateral foot, extending into the ankle. EXAM: LEFT FOOT - 2 VIEW COMPARISON:  09/04/2015, 12/16/2007. FINDINGS: No evidence of acute or subacute fracture or dislocation. Hallux valgus. Soft tissue swelling involving the great toe and the lateral foot, not significantly changed since the examination 1 week ago. No evidence of underlying osteomyelitis. Generalized osseous demineralization. Degenerative changes involving the midfoot and the IP joints of the toes. No new abnormalities.  IMPRESSION: No acute or subacute osseous abnormality. Electronically Signed   By: Evangeline Dakin M.D.   On: 09/11/2015 18:05    Scheduled Meds: . albuterol  2.5 mg Nebulization QID  . allopurinol  100 mg Oral BID  . antiseptic oral rinse  7 mL Mouth Rinse BID  . carvedilol  6.25 mg Oral BID WC  . cilostazol  50 mg Oral BID  . colchicine  0.6 mg Oral BID  . famotidine  20 mg Oral BID  . insulin aspart  0-9 Units Subcutaneous TID WC  . magnesium oxide  200 mg Oral Daily  . mirtazapine  15 mg Oral QHS  . pantoprazole  40 mg Oral BID  . piperacillin-tazobactam (ZOSYN)  IV  3.375 g Intravenous Q8H  . simvastatin  40 mg Oral QPM  . sodium chloride  3 mL Intravenous Q12H  . tiotropium  18 mcg Inhalation Daily  . vancomycin  1,250 mg Intravenous Q24H  . vitamin B-12  1,000 mcg Oral Daily  . warfarin  5 mg Oral q1800  .  Warfarin - Pharmacist Dosing Inpatient   Does not apply q1800   Continuous Infusions:   Principal Problem:   Cellulitis of toe of left foot Active Problems:   COPD (chronic obstructive pulmonary disease) (HCC)   Pulmonary embolism on right (HCC)   Cellulitis   Acute respiratory failure with hypoxia (HCC)   Toe infection    Time spent: 25 min    Kelvin Cellar  [facility] Pager [phone]. If 7PM-7AM, please contact night-coverage at [email or web address], password [facility] 09/13/2015, 2:46 PM  LOS: 2 days

## 2015-09-13 NOTE — Progress Notes (Signed)
Notified patient's Daughter Lenna Sciara) in regards to her father returning on the floor. No concerns at this time

## 2015-09-13 NOTE — Progress Notes (Signed)
Left foot dressing dry F/u 13 days for suture removal

## 2015-09-13 NOTE — Plan of Care (Signed)
Problem: Acute Rehab PT Goals(only PT should resolve) Goal: Pt Will Ambulate And WBing as ordered for LLE

## 2015-09-13 NOTE — Evaluation (Signed)
Physical Therapy Evaluation Patient Details Name: [name] MRN: [id number] DOB: 1927/10/19 Today's Date: 09/13/2015   History of Present Illness  81 yo male with onset of L great toe amputation from cellulitis and compromised circulation, now has order for PT to stand and walk    Clinical Impression  Pt was seen for evaluation of his mobility and will need appropriate footwear for LLE as he cannot use sock and other shoe won't go over bandage.  His plan is SNF as he is limited for LLE use and was requiring the assist of 2 family members prior to this procedure for short walks.  Will follow in pt until dc with PT.    Follow Up Recommendations SNF    Equipment Recommendations  None recommended by PT    Recommendations for Other Services Rehab consult     Precautions / Restrictions Precautions Precautions: Fall;Other (comment) (NWB on anterior L foot) Restrictions Weight Bearing Restrictions: Yes Other Position/Activity Restrictions: WBAT on heel in hard shoe sole      Mobility  Bed Mobility Overal bed mobility: Needs Assistance Bed Mobility: Supine to Sit;Sit to Supine     Supine to sit: Mod assist Sit to supine: Min assist;Mod assist   General bed mobility comments: HOB elevated and has limited ability to use L side  Transfers Overall transfer level: Needs assistance Equipment used: 1 person hand held assist Transfers: Sit to/from Stand Sit to Stand: Total assist;From elevated surface (Attempted with R UE and LE as LLE has no shoe to attempt)         General transfer comment: unable to stand on his RLE only and no foot wear for LLE  Ambulation/Gait             General Gait Details: unable  Stairs            Wheelchair Mobility    Modified Rankin (Stroke Patients Only)       Balance Overall balance assessment: Needs assistance Sitting-balance support: Single extremity supported Sitting balance-Leahy Scale: Fair   Postural control:  Posterior lean Standing balance support:  (unable to stand)                                 Pertinent Vitals/Pain Pain Assessment: 0-10 Pain Score: 6  Pain Location: L foot Pain Descriptors / Indicators: Operative site guarding Pain Intervention(s): Limited activity within patient's tolerance;Monitored during session;Premedicated before session;Repositioned    Home Living Family/patient expects to be discharged to:: Private residence Living Arrangements: Spouse/significant other;Children Available Help at Discharge: Family;Available 24 hours/day Type of Home: Mobile home Home Access: Ramped entrance     Home Layout: One level Home Equipment: Other (comment) (hemiwalker) Additional Comments: Pt is describing hemiwalker but not able to name it    Prior Function Level of Independence: Needs assistance   Gait / Transfers Assistance Needed: has help from 2 family members to walk  ADL's / Homemaking Assistance Needed: wife cares for home        Hand Dominance        Extremity/Trunk Assessment   Upper Extremity Assessment: LUE deficits/detail       LUE Deficits / Details: L arm in flexion posture from previous stroke   Lower Extremity Assessment: LLE deficits/detail   LLE Deficits / Details: LLE is 3+ to 4- strength from stroke  Cervical / Trunk Assessment: Kyphotic  Communication   Communication: Expressive difficulties;Other (comment) (Speech is  difficult to understand and word finding issues.)  Cognition Arousal/Alertness: Awake/alert Behavior During Therapy: WFL for tasks assessed/performed Overall Cognitive Status: No family/caregiver present to determine baseline cognitive functioning       Memory: Decreased short-term memory;Decreased recall of precautions              General Comments General comments (skin integrity, edema, etc.): Pt is getting up to bedside but cannot do the standing due to lack of use of LLE with no shoe and R side  being unable, LUE not wbing from previous stroke changes.    Exercises        Assessment/Plan    PT Assessment Patient needs continued PT services  PT Diagnosis Generalized weakness;Hemiplegia non-dominant side   PT Problem List Decreased strength;Decreased range of motion;Decreased activity tolerance;Decreased balance;Decreased mobility;Decreased coordination;Decreased cognition;Decreased safety awareness;Decreased knowledge of precautions;Cardiopulmonary status limiting activity;Decreased skin integrity;Pain  PT Treatment Interventions DME instruction;Gait training;Functional mobility training;Therapeutic activities;Therapeutic exercise;Balance training;Neuromuscular re-education;Patient/family education   PT Goals (Current goals can be found in the Care Plan section) Acute Rehab PT Goals Patient Stated Goal: to get home PT Goal Formulation: With patient Time For Goal Achievement: 09/27/15 Potential to Achieve Goals: Good    Frequency Min 3X/week   Barriers to discharge Decreased caregiver support;Other (comment) (Pt was already 2 person gait assist prior to sugery) dependent to mobilize to stand    Co-evaluation               End of Session Equipment Utilized During Treatment: Oxygen Activity Tolerance: Patient limited by fatigue;Patient limited by lethargy;Other (comment) (Need footwear for LLE to stand on it.) Patient left: in bed;with call bell/phone within reach;with bed alarm set Nurse Communication: Mobility status         Time: 1112-1140 PT Time Calculation (min) (ACUTE ONLY): 28 min   Charges:   PT Evaluation $Initial PT Evaluation Tier I: 1 Procedure PT Treatments $Therapeutic Activity: 8-22 mins   PT G Codes:        Ramond Dial 10-09-15, 12:54 PM   Mee Hives, PT MS Acute Rehab Dept. Number: [phone] [phone] and [place] [phone]

## 2015-09-13 NOTE — Discharge Instructions (Signed)
Weight bearing as tolerated in hard sole shoes

## 2015-09-13 NOTE — Progress Notes (Signed)
ANTICOAGULATION CONSULT NOTE - Follow Up Consult  Pharmacy Consult for Heparin re-start s/p OR  Indication: atrial fibrillation and pulmonary embolus  No Known Allergies  Patient Measurements: Height: 5' 0.13" (152.7 cm) Weight: 134 lb 11.2 oz (61.1 kg) IBW/kg (Calculated) : 50.3  Vital Signs: Temp: 97.4 F (36.3 C) (12/20 2359) Temp Source: Oral (12/20 2359) BP: 97/60 mmHg (12/20 2359) Pulse Rate: 95 (12/20 2359)  Labs:  Recent Labs  09/11/15 1727 09/12/15 0406 09/12/15 1630  HGB 13.0 10.8*  --   HCT 38.4* 32.9*  --   PLT 239 202  --   LABPROT 20.4* 22.8*  --   INR 1.75* 2.03*  --   HEPARINUNFRC  --  <0.10* 0.34  CREATININE 0.89 0.93  --   TROPONINI <0.03  --   --     Estimated Creatinine Clearance: 44.8 mL/min (by C-G formula based on Cr of 0.93).   Assessment: Patient is now s/p OR for left great toe amputation. Will re-start heparin at 0300 per discussion with MD. Minimal bleeding in OR.   Goal of Therapy:  Heparin level 0.3-0.7 units/ml Monitor platelets by anticoagulation protocol: Yes   Plan -Re-start heparin at 1100 units/hr at 0300 -1100 HL -Daily CBC/HL -Monitor for bleeding  Narda Bonds 09/13/2015,12:16 AM

## 2015-09-13 NOTE — Progress Notes (Signed)
ANTICOAGULATION CONSULT NOTE - Follow Up Consult  Pharmacy Consult for Warfarin Indication: PE and hx of afib  No Known Allergies   Labs:  Recent Labs  09/11/15 1727 09/12/15 0406 09/12/15 1630 09/13/15 0630  HGB 13.0 10.8*  --  10.8*  HCT 38.4* 32.9*  --  33.0*  PLT 239 202  --  191  LABPROT 20.4* 22.8*  --  21.5*  INR 1.75* 2.03*  --  1.88*  HEPARINUNFRC  --  <0.10* 0.34  --   CREATININE 0.89 0.93  --  1.06  TROPONINI <0.03  --   --   --    Assessment: 87 yoM on coumadin 5 mg daily PTA with last dose on 12/19 that was admitted for infected L great toe infection that is s/p amputation on 12/20. Warfarin held on admission due to plan for sx and pt was started on heparin. Per MD to stop heparin and resume warfarin as no further procedure planned. INR 1.88, H/H stable with no sxs of bleeding.   Goal of Therapy:  INR 2-3 Monitor platelets by anticoagulation protocol: Yes   Plan:  1. Discontinue heparin and labs associated with it 2. Resume coumadin at home dose of 5 mg daily 3. Daily INR  Vincenza Hews, PharmD, BCPS 09/13/2015, 10:05 AM Pager: [phone]

## 2015-09-14 ENCOUNTER — Inpatient Hospital Stay (HOSPITAL_COMMUNITY): Payer: Medicare Other

## 2015-09-14 DIAGNOSIS — I482 Chronic atrial fibrillation: Secondary | ICD-10-CM

## 2015-09-14 LAB — CBC
HEMATOCRIT: 30.5 % — AB (ref 39.0–52.0)
HEMOGLOBIN: 10.3 g/dL — AB (ref 13.0–17.0)
MCH: 32.7 pg (ref 26.0–34.0)
MCHC: 33.8 g/dL (ref 30.0–36.0)
MCV: 96.8 fL (ref 78.0–100.0)
Platelets: 186 10*3/uL (ref 150–400)
RBC: 3.15 MIL/uL — ABNORMAL LOW (ref 4.22–5.81)
RDW: 13.2 % (ref 11.5–15.5)
WBC: 5.5 10*3/uL (ref 4.0–10.5)

## 2015-09-14 LAB — PROTIME-INR
INR: 1.67 — AB (ref 0.00–1.49)
Prothrombin Time: 19.7 seconds — ABNORMAL HIGH (ref 11.6–15.2)

## 2015-09-14 LAB — GLUCOSE, CAPILLARY
GLUCOSE-CAPILLARY: 102 mg/dL — AB (ref 65–99)
Glucose-Capillary: 96 mg/dL (ref 65–99)

## 2015-09-14 LAB — HEMOGLOBIN A1C
Hgb A1c MFr Bld: 5.8 % — ABNORMAL HIGH (ref 4.8–5.6)
MEAN PLASMA GLUCOSE: 120 mg/dL

## 2015-09-14 MED ORDER — OXYCODONE HCL 5 MG PO TABS: 5.0000 mg | ORAL_TABLET | Freq: Four times a day (QID) | ORAL | Status: DC | PRN

## 2015-09-14 MED ORDER — DOXYCYCLINE HYCLATE 100 MG PO TABS: 100.0000 mg | ORAL_TABLET | Freq: Two times a day (BID) | ORAL | Status: DC

## 2015-09-14 NOTE — Procedures (Addendum)
Objective Swallowing Evaluation: MBS-Modified Barium Swallow Study  Patient Details  Name: [name] MRN: [id number] Date of Birth: 10-08-1927  Today's Date: 09/14/2015 Time: SLP Start Time (ACUTE ONLY): 1330-SLP Stop Time (ACUTE ONLY): 1350 SLP Time Calculation (min) (ACUTE ONLY): 20 min  Past Medical History:  Past Medical History  Diagnosis Date  . Dysrhythmia     atrial fibrilation  . Hypertension   . Shortness of breath   . Claudication of calf muscles ([place]) 11/05/2012    right calf  . GERD (gastroesophageal reflux disease)   . TIA (transient ischemic attack) 2010  . Peripheral vascular disease ([place])   . On home oxygen therapy     "2L; w/activity" (02/24/2014)  . Neuromuscular disorder ([facility])     dizziness  . Macular degeneration, bilateral     "has had shots in both eyes"  . Coronary artery disease   . COPD (chronic obstructive pulmonary disease) ([place])   . Type II diabetes mellitus ([place])   . Stroke ([place]) 05/2013    "can't use left arm fully since"  . Arthritis     "legs" (02/24/2014)  . Pneumonia     "5 times back to back recently (just finished antibiiotic) " (02/24/2014)  . Cellulitis and abscess of toe 08/2015    LEFT FOOT    Past Surgical History:  Past Surgical History  Procedure Laterality Date  . Removal of pleural drainage catheter Left 05/03/2014    Procedure: REMOVAL OF PLEURAL DRAINAGE CATHETER;  Surgeon: Ivin Poot, MD;  Location: [facility];  Service: Thoracic;  Laterality: Left;  . Foot surgery Left     due to broken foot years ago  . Esophagogastroduodenoscopy N/A 11/02/2013    Procedure: ESOPHAGOGASTRODUODENOSCOPY (EGD);  Surgeon: Cleotis Nipper, MD;  Location: [facility] ENDOSCOPY;  Service: Endoscopy;  Laterality: N/A;  . Vascular surgery      stents to legs  . Cardiac catheterization    . Transurethral resection of prostate    . Femoral artery stent Left 10/2012  . Chest tube insertion Left 02/24/2014    Procedure: INSERTION PLEURAL DRAINAGE  CATHETER;  Surgeon: Ivin Poot, MD;  Location: [facility];  Service: Thoracic;  Laterality: Left;  . Esophagogastroduodenoscopy (egd) with propofol N/A 07/06/2014    Procedure: ESOPHAGOGASTRODUODENOSCOPY (EGD) WITH PROPOFOL;  Surgeon: Cleotis Nipper, MD;  Location: [facility];  Service: Endoscopy;  Laterality: N/A;  . Lower extremity angiogram N/A 11/06/2012    Procedure: LOWER EXTREMITY ANGIOGRAM;  Surgeon: Laverda Page, MD;  Location[facility] C[facility] LAB;  Service: Cardiovascular;  Laterality: N/A;  . Amputation Left 09/12/2015    Procedure: LEFT GREAT TOE AMPUTATION;  Surgeon: Meredith Pel, MD;  Location: [facility];  Service: Orthopedics;  Laterality: Left;   HPI: 81 y.o. male with hx of CVA admitted with cellulitis of great toe on left foot, acute hypoxic respiratory failure (requires home 02).  Subjective: pleasant, communicative  Assessment / Plan / Recommendation  CHL IP CLINICAL IMPRESSIONS 09/14/2015  Therapy Diagnosis Moderate pharyngeal phase dysphagia  Clinical Impression Pt demonstrates a moderate oropharyngeal dysphagia consistent with prior MBS. Swallow is significantly delayed and with moderate to large swallows pt silently aspirates before the swallow. There is reduced mobility of the hyolaryngeal complex with moderate residuals in the valleculae and pyriforms with all textures that are likely penetrated post swallow given frequent wet vocal quality after meals. Discussed situation with patient and used visual feedback to reinforce findings. Pt verbalized understanding of the importance of small  sips, which he has not been following of late. Recommend pt continue current diet with added precaution of no straws, small sips and a second swallow with f/u to reinforce. Hopeful that pt can follow precautions as no texture eliminates risk in his situation.   Impact on safety and function Moderate aspiration risk      CHL IP TREATMENT RECOMMENDATION 09/14/2015  Treatment  Recommendations Therapy as outlined in treatment plan below     Prognosis 01/01/2014  Prognosis for Safe Diet Advancement Fair  Barriers to Reach Goals --  Barriers/Prognosis Comment --    CHL IP DIET RECOMMENDATION 09/14/2015  SLP Diet Recommendations Regular solids;Thin liquid  Liquid Administration via Cup;No straw  Medication Administration Whole meds with puree  Compensations Slow rate;Small sips/bites;Multiple dry swallows after each bite/sip  Postural Changes Remain semi-upright after after feeds/meals (Comment)      CHL IP OTHER RECOMMENDATIONS 09/14/2015  Recommended Consults --  Oral Care Recommendations Oral care BID  Other Recommendations --      CHL IP FOLLOW UP RECOMMENDATIONS 09/14/2015  Follow up Recommendations 24 hour supervision/assistance      CHL IP FREQUENCY AND DURATION 09/14/2015  Speech Therapy Frequency (ACUTE ONLY) min 2x/week  Treatment Duration 1 week           CHL IP ORAL PHASE 09/14/2015  Oral Phase WFL  Oral - Pudding Teaspoon --  Oral - Pudding Cup --  Oral - Honey Teaspoon --  Oral - Honey Cup --  Oral - Nectar Teaspoon --  Oral - Nectar Cup --  Oral - Nectar Straw --  Oral - Thin Teaspoon --  Oral - Thin Cup --  Oral - Thin Straw --  Oral - Puree --  Oral - Mech Soft --  Oral - Regular --  Oral - Multi-Consistency --  Oral - Pill --  Oral Phase - Comment --    CHL IP PHARYNGEAL PHASE 09/14/2015  Pharyngeal Phase Impaired  Pharyngeal- Pudding Teaspoon --  Pharyngeal --  Pharyngeal- Pudding Cup --  Pharyngeal --  Pharyngeal- Honey Teaspoon --  Pharyngeal --  Pharyngeal- Honey Cup --  Pharyngeal --  Pharyngeal- Nectar Teaspoon --  Pharyngeal --  Pharyngeal- Nectar Cup Delayed swallow initiation-pyriform sinuses;Reduced pharyngeal peristalsis;Reduced anterior laryngeal mobility;Reduced laryngeal elevation;Pharyngeal residue - valleculae;Pharyngeal residue - pyriform  Pharyngeal --  Pharyngeal- Nectar Straw --  Pharyngeal  --  Pharyngeal- Thin Teaspoon --  Pharyngeal --  Pharyngeal- Thin Cup Delayed swallow initiation-pyriform sinuses;Reduced pharyngeal peristalsis;Reduced anterior laryngeal mobility;Reduced laryngeal elevation;Pharyngeal residue - valleculae;Pharyngeal residue - pyriform;Penetration/Aspiration before swallow;Trace aspiration  Pharyngeal Material enters airway, passes BELOW cords without attempt by patient to eject out (silent aspiration)  Pharyngeal- Thin Straw --  Pharyngeal --  Pharyngeal- Puree Reduced pharyngeal peristalsis;Reduced anterior laryngeal mobility;Reduced laryngeal elevation;Pharyngeal residue - valleculae;Pharyngeal residue - pyriform;Delayed swallow initiation-vallecula  Pharyngeal --  Pharyngeal- Mechanical Soft --  Pharyngeal --  Pharyngeal- Regular Reduced pharyngeal peristalsis;Reduced anterior laryngeal mobility;Reduced laryngeal elevation;Pharyngeal residue - valleculae;Pharyngeal residue - pyriform  Pharyngeal --  Pharyngeal- Multi-consistency --  Pharyngeal --  Pharyngeal- Pill --  Pharyngeal --  Pharyngeal Comment --     No flowsheet data found.  Herbie [place], [place] CCC-SLP [phone]  Lynann Beaver 09/14/2015, 2:05 PM

## 2015-09-14 NOTE — Progress Notes (Signed)
[name] to be D/C'd Home per MD order.  Discussed with the patient and all questions fully answered.  VSS, Skin clean, dry and intact without evidence of skin break down, no evidence of skin tears noted. IV catheter discontinued intact. Site without signs and symptoms of complications. Dressing and pressure applied.  An After Visit Summary was printed and given to the patient. Patient received prescription.  D/c education completed with patient/family including follow up instructions, medication list, d/c activities limitations if indicated, with other d/c instructions as indicated by MD - patient able to verbalize understanding, all questions fully answered.   Patient instructed to return to ED, call 911, or call MD for any changes in condition.   Patient escorted via [place], and D/C home via private auto.  [name] 09/14/2015 4:35 PM

## 2015-09-14 NOTE — Care Management Important Message (Signed)
Important Message  Patient Details  Name: [name] MRN: [id number] Date of Birth: [birth year]   Medicare Important Message Given:  Yes    [name] May 09/14/2015, 2:29 PM

## 2015-09-14 NOTE — Discharge Summary (Signed)
Physician Discharge Summary  [name] [facility] [id number] DOB: 16-Feb-1928 DOA: 09/11/2015  PCP: [name], MD  Admit date: 09/11/2015 Discharge date: 09/14/2015  Time spent: 35 minutes  Recommendations for Outpatient Follow-up:  1. Please follow-up on PT/INR, patient anticoagulated with Coumadin. He had an INR of 1.67 on day of discharge. 2. Follow-up on CBC and BMP on hospital follow-up visit 3. Prior to discharge he was set up with home health services for PT and RN.   Discharge Diagnoses:  Principal Problem:   Cellulitis of toe of left foot Active Problems:   COPD (chronic obstructive pulmonary disease) (HCC)   Pulmonary embolism on right (HCC)   Cellulitis   Acute respiratory failure with hypoxia (HCC)   Toe infection   Discharge Condition: Stable  Diet recommendation: Carb Modified Diet  Filed Weights   09/12/15 0552 09/13/15 0529 09/14/15 0500  Weight: 61.1 kg (134 lb 11.2 oz) 62.596 kg (138 lb) 67.994 kg (149 lb 14.4 oz)    History of present illness:  [name] is a 81 y.o. male with history of paroxysmal atrial fibrillation, recently diagnosed PE, history of COPD, hypertension gout was referred to the ER as patient's foot infection was getting worse. Patient had infected left great toe nail which was removed 3 weeks ago by patient's podiatrist. Following which patient was found to have increasing infection of the left great toe and was given oral antibiotics. Despite taking which patient's symptoms progress. Patient was given another course by patient's primary care physician. Despite taking which patient's does worsen with increasing pain and was referred to the ER. On exam patient has darkened left great toe concerning for a skin anemia. Per patient's dorsalis artery is dopplerable. There is also mild swelling of the left foot area. By the time I examined the patient patient also has become hypoxic requiring 5 L oxygen. Patient denies any chest pain or  shortness of breath. And is not in distress on exam.   Hospital Course:    [name] is a pleasant 81 year old gentleman with a history of paroxysmal atrial fibrillation, history of pulmonary embolism, chronic obstructive pulmonary disease admitted to the medicine service on 09/11/2015 presented with left foot pain. He was found to have left foot great toe infection which had progressed despite receiving oral antibiotics in the outpatient setting. MRI revealed findings suggestive of osteomyelitis. Orthopedic surgery was consulted. He was taken to the operating room on 09/12/2015 where he underwent left great toe amputation. Postoperatively he did well. He was discharged home with home health services on 09/12/2015.   Left great toe osteomyelitis. -[name] presenting with complaints of left great to pain, further workup with MRI of foot on 09/12/2015 that revealed marrow changes within the distal phalanx of great toe suspicious for osteomyelitis. -He was seen and evaluated by orthopedic surgery, taken to the operating room on 09/12/2015 undergoing amputation of left great toe. Procedure was performed by Dr.Dean of orthopedic surgery.  -I discontinued broad-spectrum IV antibiotic therapy and started doxycycline 100 mg by mouth twice a day on 09/13/2015  2. Chronic atrial fibrillation -CHADSVasc score of 3 -Patient's Coumadin was held for anticipated surgery and placed on IV heparin. -He was discharged on Coumadin. Follow-up on PT/INR   3. History of pulmonary embolism -repeat CT scan with IV contrast performed on 09/12/2015 showing remote pulmonary emboli, without evidence of acute PE.  -Will be discharged on Coumadin.   4. Diabetes mellitus  -Blood sugars are stable, continue sling scale coverage -Will  discharge on his metformin 500 mg by mouth twice a day  5. Dyslipidemia -Continue simvastatin    Consultations:  Orthopedic surgery   Discharge Exam: Filed Vitals:    09/14/15 1033 09/14/15 1151  BP:    Pulse: 75 55  Temp:    Resp: 18 16     General: Patient is awake alert, no acute distress  Cardiovascular: Regular rate and rhythm normal S1-S2  Respiratory: Normal respiratory effort  Abdomen: Soft nontender nondistended  Musculoskeletal: Left foot is bandaged, status post amputation of left great toe   Discharge Instructions   Discharge Instructions    Call MD for:  difficulty breathing, headache or visual disturbances    Complete by:  As directed      Call MD for:  extreme fatigue    Complete by:  As directed      Call MD for:  hives    Complete by:  As directed      Call MD for:  persistant dizziness or light-headedness    Complete by:  As directed      Call MD for:  persistant nausea and vomiting    Complete by:  As directed      Call MD for:  redness, tenderness, or signs of infection (pain, swelling, redness, odor or green/yellow discharge around incision site)    Complete by:  As directed      Call MD for:  severe uncontrolled pain    Complete by:  As directed      Call MD for:  temperature >100.4    Complete by:  As directed      Call MD for:    Complete by:  As directed      Diet - low sodium heart healthy    Complete by:  As directed      Increase activity slowly    Complete by:  As directed           Current Discharge Medication List    START taking these medications   Details  doxycycline (VIBRA-TABS) 100 MG tablet Take 1 tablet (100 mg total) by mouth every 12 (twelve) hours. Qty: 14 tablet, Refills: 0    oxyCODONE (ROXICODONE) 5 MG immediate release tablet Take 1 tablet (5 mg total) by mouth every 6 (six) hours as needed for severe pain. Qty: 20 tablet, Refills: 0      CONTINUE these medications which have NOT CHANGED   Details  albuterol (PROVENTIL) (2.5 MG/3ML) 0.083% nebulizer solution Take 2.5 mg by nebulization 4 (four) times daily.    allopurinol (ZYLOPRIM) 100 MG tablet Take 100 mg by mouth 2  (two) times daily.     budesonide (PULMICORT) 0.5 MG/2ML nebulizer solution Take 0.5 mg by nebulization 2 (two) times daily as needed (for wheezing/shortness of breath).     carvedilol (COREG) 6.25 MG tablet Take 6.25 mg by mouth 2 (two) times daily with a meal.    cilostazol (PLETAL) 50 MG tablet Take 50 mg by mouth 2 (two) times daily.    colchicine 0.6 MG tablet Take 1 tablet (0.6 mg total) by mouth 2 (two) times daily. Qty: 10 tablet, Refills: 0    magnesium oxide (MAG-OX) 400 MG tablet Take 200 mg by mouth daily.    Melatonin 3 MG TABS Take 3 mg by mouth at bedtime as needed (sleep).     metFORMIN (GLUCOPHAGE) 1000 MG tablet Take 500 mg by mouth 2 (two) times daily with a meal.    mirtazapine (REMERON)  15 MG tablet Take 15 mg by mouth at bedtime.    Multiple Vitamins-Minerals (OCUVITE PRESERVISION PO) Take 1 tablet by mouth daily.    pantoprazole (PROTONIX) 40 MG tablet Take 1 tablet (40 mg total) by mouth 2 (two) times daily. Qty: 30 tablet, Refills: 0    ranitidine (ZANTAC) 300 MG tablet Take 300 mg by mouth at bedtime.    simvastatin (ZOCOR) 40 MG tablet Take 40 mg by mouth every evening.    tiotropium (SPIRIVA) 18 MCG inhalation capsule Place 18 mcg into inhaler and inhale daily.    traMADol (ULTRAM) 50 MG tablet Take 1 tablet (50 mg total) by mouth every 6 (six) hours as needed for moderate pain. Qty: 120 tablet, Refills: 0    vitamin B-12 (CYANOCOBALAMIN) 1000 MCG tablet Take 1,000 mcg by mouth daily.    warfarin (COUMADIN) 5 MG tablet Take 1 tablet by mouth daily. Refills: 0      STOP taking these medications     amoxicillin-clavulanate (AUGMENTIN) 875-125 MG tablet        No Known Allergies Follow-up Information    Follow up with Meredith Pel, MD In 13 days.   Specialty:  Orthopedic Surgery   Contact information:   [place] [address] [phone]       Follow up with [name], MD In 2 weeks.   Specialty:  Internal  Medicine   Contact information:   [address] [place] [address] [phone]        The results of significant diagnostics from this hospitalization (including imaging, microbiology, ancillary and laboratory) are listed below for reference.    Significant Diagnostic Studies: Dg Chest 2 View  09/11/2015  CLINICAL DATA:  Hypertension EXAM: CHEST  2 VIEW COMPARISON:  July 21, 2015 FINDINGS: There is scarring in the lung bases bilaterally. The somewhat nodular appearance in the left base on the prior study is no longer appreciable. There is a calcified granuloma the right base. Lungs are hyperexpanded. There is no frank edema or consolidation. The heart is upper normal in size. The pulmonary vascular is within normal limits. No adenopathy. No bone lesions. IMPRESSION: Bibasilar lung scarring. Calcified granuloma right base. Lungs somewhat hyperexpanded. No edema or consolidation. Electronically Signed   By: Lowella Grip III M.D.   On: 09/11/2015 18:05   Ct Angio Chest Pe W/cm &/or Wo Cm  09/12/2015  CLINICAL DATA:  Hypoxia.  Foot swelling. EXAM: CT ANGIOGRAPHY CHEST WITH CONTRAST TECHNIQUE: Multidetector CT imaging of the chest was performed using the standard protocol during bolus administration of intravenous contrast. Multiplanar CT image reconstructions and MIPs were obtained to evaluate the vascular anatomy. CONTRAST:  52mL OMNIPAQUE IOHEXOL 350 MG/ML SOLN COMPARISON:  06/23/2015 FINDINGS: THORACIC INLET/BODY WALL: No acute abnormality. MEDIASTINUM: Chronic cardiomegaly without pericardial effusion. Essentially non-opacified systemic arterial tree without indication of acute disease. Extensive atherosclerosis, including the coronary arteries. There is chronic poor opacification of bilateral lower lobe segmental and subsegmental branches with eccentric filling defects consistent with chronic emboli. Poorly aerated and scarred lower lobes may contribute to poor  opacification of these vessels. There is also chronic underfilling and collapse of medial segment right middle lobe pulmonary arterial branches. Pulmonary hypertension with main pulmonary artery diameter of 4 cm. No acute pulmonary embolism is identified. LUNG WINDOWS: Emphysema and chronic bronchitis. There is extensive airway debris within the right lower airways and patchy mucoid impaction within left lower lobe bronchi. Bilateral lower lobe atelectasis or scarring, improved on  the left since prior. No edema, effusion, or pneumothorax. UPPER ABDOMEN: No acute findings. OSSEOUS: No acute finding.  Remote left-sided rib fractures. Review of the MIP images confirms the above findings. IMPRESSION: 1. Extensive debris with the right lower lobe airways, suspect recurrent aspiration given similar lower lobe findings on September 2016 chest CTs. 2. Remote bilateral pulmonary embolism with poor lower lobe blood flow and pulmonary hypertension. No acute pulmonary embolism identified. 3. Emphysema. Electronically Signed   By: Monte Fantasia M.D.   On: 09/12/2015 02:54   Mr Foot Left Wo Contrast  09/12/2015  CLINICAL DATA:  Left great toe infection following removal of toenail 3 weeks ago. Increasing pain and discoloration despite antibiotics. EXAM: MRI OF THE LEFT FOREFOOT WITHOUT CONTRAST TECHNIQUE: Multiplanar, multisequence MR imaging was performed. No intravenous contrast was administered. COMPARISON:  Radiographs 09/11/2015. FINDINGS: Decreased T1 and increased T2 marrow signal is present throughout the distal phalanx of the great toe. The cortex appears mildly thinned and ill-defined on the coronal T1 weighted images. There is no frank cortical destruction. There is no interphalangeal joint effusion. The proximal phalanx and first metatarsal appear normal. The additional toes and metatarsals demonstrate no significant findings. There is no evidence of soft tissue abscess. There is mild dorsal subcutaneous edema.  There is also mild T2 hyperintensity throughout the forefoot musculature, likely secondary to underlying diabetes. IMPRESSION: 1. Marrow changes within the distal phalanx of the great toe suspicious for osteomyelitis. There are possible early cortical changes in the tuft. 2. No evidence of soft tissue abscess or septic joint. Electronically Signed   By: Richardean Sale M.D.   On: 09/12/2015 10:54   Dg Chest Port 1 View  09/11/2015  CLINICAL DATA:  Decreasing oxygen sats for 1 hour EXAM: PORTABLE CHEST 1 VIEW COMPARISON:  09/11/2015 FINDINGS: Cardiac silhouette within normal limits. Calcified 5 mm granuloma right lower lobe stable increased interstitial markings left lower lobe stable from film performed 1731 hours today. IMPRESSION: Stable appearance from film performed earlier today, in turn stable from 07/21/2015. Electronically Signed   By: Skipper Cliche M.D.   On: 09/11/2015 21:21   Dg Foot 2 Views Left  09/11/2015  CLINICAL DATA:  81 year old with recent removal of the toe nail from the left great toe, presenting with pain and swelling involving the great toe, the lateral foot, extending into the ankle. EXAM: LEFT FOOT - 2 VIEW COMPARISON:  09/04/2015, 12/16/2007. FINDINGS: No evidence of acute or subacute fracture or dislocation. Hallux valgus. Soft tissue swelling involving the great toe and the lateral foot, not significantly changed since the examination 1 week ago. No evidence of underlying osteomyelitis. Generalized osseous demineralization. Degenerative changes involving the midfoot and the IP joints of the toes. No new abnormalities. IMPRESSION: No acute or subacute osseous abnormality. Electronically Signed   By: Evangeline Dakin M.D.   On: 09/11/2015 18:05    Microbiology: Recent Results (from the past 240 hour(s))  Blood culture (routine x 2)     Status: None (Preliminary result)   Collection Time: 09/11/15  5:27 PM  Result Value Ref Range Status   Specimen Description BLOOD RIGHT  ARM  Final   Special Requests IN PEDIATRIC BOTTLE 4CC  Final   Culture NO GROWTH 2 DAYS  Final   Report Status PENDING  Incomplete  Surgical pcr screen     Status: None   Collection Time: 09/12/15  8:55 PM  Result Value Ref Range Status   MRSA, PCR NEGATIVE NEGATIVE Final   Staphylococcus  aureus NEGATIVE NEGATIVE Final    Comment:        The Xpert SA Assay (FDA approved for NASAL specimens in patients over 21 years of age), is one component of a comprehensive surveillance program.  Test performance has been validated by [facility] for patients greater than or equal to 41 year old. It is not intended to diagnose infection nor to guide or monitor treatment.      Labs: Basic Metabolic Panel:  Recent Labs Lab 09/11/15 1727 09/12/15 0406 09/13/15 0630  NA 138 138 138  K 5.2* 4.4 3.9  CL 103 106 106  CO2 26 28 24   GLUCOSE 101* 100* 81  BUN 34* 31* 27*  CREATININE 0.89 0.93 1.06  CALCIUM 9.4 9.0 8.6*  MG  --   --  1.9   Liver Function Tests:  Recent Labs Lab 09/11/15 1727 09/12/15 0406  AST 32 17  ALT 14* 13*  ALKPHOS 73 62  BILITOT 1.2 0.4  PROT 7.9 6.6  ALBUMIN 3.5 2.9*   No results for input(s): LIPASE, AMYLASE in the last 168 hours. No results for input(s): AMMONIA in the last 168 hours. CBC:  Recent Labs Lab 09/11/15 1727 09/12/15 0406 09/13/15 0630 09/14/15 0612  WBC 6.7 5.1 4.8 5.5  NEUTROABS 3.8 1.8  --   --   HGB 13.0 10.8* 10.8* 10.3*  HCT 38.4* 32.9* 33.0* 30.5*  MCV 99.0 100.3* 100.0 96.8  PLT 239 202 191 186   Cardiac Enzymes:  Recent Labs Lab 09/11/15 1727  TROPONINI <0.03   BNP: BNP (last 3 results) No results for input(s): BNP in the last 8760 hours.  ProBNP (last 3 results) No results for input(s): PROBNP in the last 8760 hours.  CBG:  Recent Labs Lab 09/13/15 1143 09/13/15 1648 09/13/15 2203 09/14/15 0740 09/14/15 1211  GLUCAP 133* 141* 113* 96 102*       Signed:  Kelvin Cellar MD  FACP  [facility] 09/14/2015, 1:10 PM

## 2015-09-14 NOTE — Care Management Note (Signed)
Case Management Note  Patient Details  Name: [name] MRN: [id number] Date of Birth: 20-Jul-1928  Subjective/Objective:    81 y.o. M to be discharged home today with HHPT provided by [facility] notified). Spouse states they have all DME.                 Action/Plan: Anticipate discharge home today. No further CM needs but will be available should additional discharge needs arise.   Expected Discharge Date:                  Expected Discharge Plan:  [place]  In-House Referral:     Discharge planning Services  CM Consult  Post Acute Care Choice:    Choice offered to:  Patient, Spouse  DME Arranged:   (has all DME) DME Agency:     HH Arranged:  PT HH Agency:  [place]  Status of Service:  Completed, signed off  Medicare Important Message Given:  Yes Date Medicare IM Given:    Medicare IM give by:    Date Additional Medicare IM Given:    Additional Medicare Important Message give by:     If discussed at [place] of Stay Meetings, dates discussed:    Additional Comments:  Delrae Sawyers, RN 09/14/2015, 2:55 PM

## 2015-09-14 NOTE — Progress Notes (Signed)
CSW received consult for SNF placement at discharge.  CSW spoke w/ patient, patient's daughter, and granddaughter at bedside. Pt lives w/ large family and has plenty of support at home.  Pt refusing SNF and wants to go home w/ home health.  CSW signing off.  [name] LCSWA [phone]

## 2015-09-14 NOTE — Progress Notes (Signed)
Speech Language Pathology Treatment: Dysphagia  Patient Details Name: [name] MRN: [id number] DOB: [birth year] Today's Date: 09/14/2015 Time: [id number] SLP Time Calculation (min) (ACUTE ONLY): 13 min  Assessment / Plan / Recommendation Clinical Impression  Pt demonstrates subtle signs of dysphagia and possible aspiration with water (audible swallow indicative of delay, voice change and delayed throat clearing). Pt does not independently take small sips as he has been suggested in the past, confirms occasional choking and CT concerning for debris in lungs. Given these results suggested MBS to pt and explained that it could lead to a recommendation for thickened liquids. Thee pt reports he has taken thickened liquids in the past and did not mind them and would take them again if needed. Will proceed with MBS today for objective assessment of pts swallow.    HPI HPI: 82 y.o. male with hx of CVA admitted with cellulitis of great toe on left foot, acute hypoxic respiratory failure (requires home 02).      SLP Plan  MBS     Recommendations  Diet recommendations: Regular;Thin liquid Liquids provided via: Cup Medication Administration: Whole meds with puree Supervision: Patient able to self feed Compensations: Slow rate;Small sips/bites Postural Changes and/or Swallow Maneuvers: Seated upright 90 degrees              Oral Care Recommendations: Oral care BID Plan: MBS  Herbie Baltimore, MA CCC-SLP [phone]  Lynann Beaver 09/14/2015, 11:48 AM

## 2015-09-14 NOTE — Progress Notes (Signed)
RT note - Pt assessment completed. Left medication as ordered sine they are his home regime.

## 2015-09-14 NOTE — Progress Notes (Signed)
ANTICOAGULATION CONSULT NOTE - Follow Up Consult  Pharmacy Consult for coumadin Indication: hx of afib and PE  No Known Allergies  Patient Measurements: Height: 5' 0.13" (152.7 cm) Weight: 149 lb 14.4 oz (67.994 kg) IBW/kg (Calculated) : 50.3 Heparin Dosing Weight:   Vital Signs: Temp: 98.1 F (36.7 C) (12/22 0407) Temp Source: Oral (12/22 0407) BP: 109/53 mmHg (12/22 0407) Pulse Rate: 74 (12/22 0751)  Labs:  Recent Labs  09/11/15 1727 09/12/15 0406 09/12/15 1630 09/13/15 0630 09/14/15 0612  HGB 13.0 10.8*  --  10.8* 10.3*  HCT 38.4* 32.9*  --  33.0* 30.5*  PLT 239 202  --  191 186  LABPROT 20.4* 22.8*  --  21.5* 19.7*  INR 1.75* 2.03*  --  1.88* 1.67*  HEPARINUNFRC  --  <0.10* 0.34  --   --   CREATININE 0.89 0.93  --  1.06  --   TROPONINI <0.03  --   --   --   --     Estimated Creatinine Clearance: 41.4 mL/min (by C-G formula based on Cr of 1.06).   Medications:  Scheduled:  . albuterol  2.5 mg Nebulization QID  . allopurinol  100 mg Oral BID  . antiseptic oral rinse  7 mL Mouth Rinse BID  . carvedilol  6.25 mg Oral BID WC  . cilostazol  50 mg Oral BID  . colchicine  0.6 mg Oral BID  . doxycycline  100 mg Oral Q12H  . famotidine  20 mg Oral BID  . insulin aspart  0-9 Units Subcutaneous TID WC  . magnesium oxide  200 mg Oral Daily  . metFORMIN  500 mg Oral BID WC  . mirtazapine  15 mg Oral QHS  . pantoprazole  40 mg Oral BID  . simvastatin  40 mg Oral QPM  . sodium chloride  3 mL Intravenous Q12H  . tiotropium  18 mcg Inhalation Daily  . vitamin B-12  1,000 mcg Oral Daily  . warfarin  5 mg Oral q1800  . Warfarin - Pharmacist Dosing Inpatient   Does not apply q1800   Infusions:    Assessment: 81 yo male with hx of PE and afib is currently on subtherapeutic coumadin.  INR is 1.67.  Patient was previously therapeutic on coumadin 5 mg po daily.  Goal of Therapy:  INR 2-3 Monitor platelets by anticoagulation protocol: Yes   Plan:  - continue  coumadin 5 mg po daily - INR in am  [name], Tsz-Yin 09/14/2015,8:13 AM

## 2015-09-16 LAB — CULTURE, BLOOD (ROUTINE X 2): Culture: NO GROWTH

## 2015-09-19 DIAGNOSIS — E1151 Type 2 diabetes mellitus with diabetic peripheral angiopathy without gangrene: Secondary | ICD-10-CM | POA: Diagnosis not present

## 2015-09-19 DIAGNOSIS — I119 Hypertensive heart disease without heart failure: Secondary | ICD-10-CM | POA: Diagnosis not present

## 2015-09-19 DIAGNOSIS — I69354 Hemiplegia and hemiparesis following cerebral infarction affecting left non-dominant side: Secondary | ICD-10-CM | POA: Diagnosis not present

## 2015-09-19 DIAGNOSIS — Z4781 Encounter for orthopedic aftercare following surgical amputation: Secondary | ICD-10-CM | POA: Diagnosis not present

## 2015-09-20 DIAGNOSIS — I10 Essential (primary) hypertension: Secondary | ICD-10-CM | POA: Diagnosis not present

## 2015-09-20 DIAGNOSIS — Z89422 Acquired absence of other left toe(s): Secondary | ICD-10-CM | POA: Diagnosis not present

## 2015-09-20 DIAGNOSIS — E1151 Type 2 diabetes mellitus with diabetic peripheral angiopathy without gangrene: Secondary | ICD-10-CM | POA: Diagnosis not present

## 2015-09-20 DIAGNOSIS — E118 Type 2 diabetes mellitus with unspecified complications: Secondary | ICD-10-CM | POA: Diagnosis not present

## 2015-09-20 DIAGNOSIS — I4891 Unspecified atrial fibrillation: Secondary | ICD-10-CM | POA: Diagnosis not present

## 2015-09-20 DIAGNOSIS — Z23 Encounter for immunization: Secondary | ICD-10-CM | POA: Diagnosis not present

## 2015-09-20 DIAGNOSIS — Z4781 Encounter for orthopedic aftercare following surgical amputation: Secondary | ICD-10-CM | POA: Diagnosis not present

## 2015-09-20 DIAGNOSIS — I69354 Hemiplegia and hemiparesis following cerebral infarction affecting left non-dominant side: Secondary | ICD-10-CM | POA: Diagnosis not present

## 2015-09-20 DIAGNOSIS — I119 Hypertensive heart disease without heart failure: Secondary | ICD-10-CM | POA: Diagnosis not present

## 2015-09-22 DIAGNOSIS — E1151 Type 2 diabetes mellitus with diabetic peripheral angiopathy without gangrene: Secondary | ICD-10-CM | POA: Diagnosis not present

## 2015-09-22 DIAGNOSIS — I119 Hypertensive heart disease without heart failure: Secondary | ICD-10-CM | POA: Diagnosis not present

## 2015-09-22 DIAGNOSIS — I69354 Hemiplegia and hemiparesis following cerebral infarction affecting left non-dominant side: Secondary | ICD-10-CM | POA: Diagnosis not present

## 2015-09-22 DIAGNOSIS — Z4781 Encounter for orthopedic aftercare following surgical amputation: Secondary | ICD-10-CM | POA: Diagnosis not present

## 2015-09-26 DIAGNOSIS — M869 Osteomyelitis, unspecified: Secondary | ICD-10-CM | POA: Diagnosis not present

## 2015-09-26 DIAGNOSIS — I69354 Hemiplegia and hemiparesis following cerebral infarction affecting left non-dominant side: Secondary | ICD-10-CM | POA: Diagnosis not present

## 2015-09-26 DIAGNOSIS — I119 Hypertensive heart disease without heart failure: Secondary | ICD-10-CM | POA: Diagnosis not present

## 2015-09-26 DIAGNOSIS — Z4781 Encounter for orthopedic aftercare following surgical amputation: Secondary | ICD-10-CM | POA: Diagnosis not present

## 2015-09-26 DIAGNOSIS — E1151 Type 2 diabetes mellitus with diabetic peripheral angiopathy without gangrene: Secondary | ICD-10-CM | POA: Diagnosis not present

## 2015-09-26 DIAGNOSIS — I4891 Unspecified atrial fibrillation: Secondary | ICD-10-CM | POA: Diagnosis not present

## 2015-09-26 DIAGNOSIS — Z7901 Long term (current) use of anticoagulants: Secondary | ICD-10-CM | POA: Diagnosis not present

## 2015-09-28 DIAGNOSIS — Z4781 Encounter for orthopedic aftercare following surgical amputation: Secondary | ICD-10-CM | POA: Diagnosis not present

## 2015-09-28 DIAGNOSIS — I119 Hypertensive heart disease without heart failure: Secondary | ICD-10-CM | POA: Diagnosis not present

## 2015-09-28 DIAGNOSIS — E1151 Type 2 diabetes mellitus with diabetic peripheral angiopathy without gangrene: Secondary | ICD-10-CM | POA: Diagnosis not present

## 2015-09-28 DIAGNOSIS — I69354 Hemiplegia and hemiparesis following cerebral infarction affecting left non-dominant side: Secondary | ICD-10-CM | POA: Diagnosis not present

## 2015-10-04 DIAGNOSIS — E1151 Type 2 diabetes mellitus with diabetic peripheral angiopathy without gangrene: Secondary | ICD-10-CM | POA: Diagnosis not present

## 2015-10-04 DIAGNOSIS — I69354 Hemiplegia and hemiparesis following cerebral infarction affecting left non-dominant side: Secondary | ICD-10-CM | POA: Diagnosis not present

## 2015-10-04 DIAGNOSIS — Z4781 Encounter for orthopedic aftercare following surgical amputation: Secondary | ICD-10-CM | POA: Diagnosis not present

## 2015-10-04 DIAGNOSIS — I119 Hypertensive heart disease without heart failure: Secondary | ICD-10-CM | POA: Diagnosis not present

## 2015-10-05 DIAGNOSIS — E1151 Type 2 diabetes mellitus with diabetic peripheral angiopathy without gangrene: Secondary | ICD-10-CM | POA: Diagnosis not present

## 2015-10-05 DIAGNOSIS — I119 Hypertensive heart disease without heart failure: Secondary | ICD-10-CM | POA: Diagnosis not present

## 2015-10-05 DIAGNOSIS — E119 Type 2 diabetes mellitus without complications: Secondary | ICD-10-CM | POA: Diagnosis not present

## 2015-10-05 DIAGNOSIS — I69354 Hemiplegia and hemiparesis following cerebral infarction affecting left non-dominant side: Secondary | ICD-10-CM | POA: Diagnosis not present

## 2015-10-05 DIAGNOSIS — E7800 Pure hypercholesterolemia, unspecified: Secondary | ICD-10-CM | POA: Diagnosis not present

## 2015-10-05 DIAGNOSIS — Z4781 Encounter for orthopedic aftercare following surgical amputation: Secondary | ICD-10-CM | POA: Diagnosis not present

## 2015-10-05 DIAGNOSIS — I10 Essential (primary) hypertension: Secondary | ICD-10-CM | POA: Diagnosis not present

## 2015-10-10 DIAGNOSIS — J189 Pneumonia, unspecified organism: Secondary | ICD-10-CM | POA: Diagnosis not present

## 2015-10-10 DIAGNOSIS — R05 Cough: Secondary | ICD-10-CM | POA: Diagnosis not present

## 2015-10-10 DIAGNOSIS — I119 Hypertensive heart disease without heart failure: Secondary | ICD-10-CM | POA: Diagnosis not present

## 2015-10-10 DIAGNOSIS — Z4781 Encounter for orthopedic aftercare following surgical amputation: Secondary | ICD-10-CM | POA: Diagnosis not present

## 2015-10-10 DIAGNOSIS — I69354 Hemiplegia and hemiparesis following cerebral infarction affecting left non-dominant side: Secondary | ICD-10-CM | POA: Diagnosis not present

## 2015-10-10 DIAGNOSIS — Z1389 Encounter for screening for other disorder: Secondary | ICD-10-CM | POA: Diagnosis not present

## 2015-10-10 DIAGNOSIS — E1151 Type 2 diabetes mellitus with diabetic peripheral angiopathy without gangrene: Secondary | ICD-10-CM | POA: Diagnosis not present

## 2015-10-10 DIAGNOSIS — I10 Essential (primary) hypertension: Secondary | ICD-10-CM | POA: Diagnosis not present

## 2015-10-12 DIAGNOSIS — I69354 Hemiplegia and hemiparesis following cerebral infarction affecting left non-dominant side: Secondary | ICD-10-CM | POA: Diagnosis not present

## 2015-10-12 DIAGNOSIS — E1151 Type 2 diabetes mellitus with diabetic peripheral angiopathy without gangrene: Secondary | ICD-10-CM | POA: Diagnosis not present

## 2015-10-12 DIAGNOSIS — Z4781 Encounter for orthopedic aftercare following surgical amputation: Secondary | ICD-10-CM | POA: Diagnosis not present

## 2015-10-12 DIAGNOSIS — I119 Hypertensive heart disease without heart failure: Secondary | ICD-10-CM | POA: Diagnosis not present

## 2015-10-17 DIAGNOSIS — E1151 Type 2 diabetes mellitus with diabetic peripheral angiopathy without gangrene: Secondary | ICD-10-CM | POA: Diagnosis not present

## 2015-10-17 DIAGNOSIS — I69354 Hemiplegia and hemiparesis following cerebral infarction affecting left non-dominant side: Secondary | ICD-10-CM | POA: Diagnosis not present

## 2015-10-17 DIAGNOSIS — I119 Hypertensive heart disease without heart failure: Secondary | ICD-10-CM | POA: Diagnosis not present

## 2015-10-17 DIAGNOSIS — Z4781 Encounter for orthopedic aftercare following surgical amputation: Secondary | ICD-10-CM | POA: Diagnosis not present

## 2015-10-19 DIAGNOSIS — Z4781 Encounter for orthopedic aftercare following surgical amputation: Secondary | ICD-10-CM | POA: Diagnosis not present

## 2015-10-19 DIAGNOSIS — I69354 Hemiplegia and hemiparesis following cerebral infarction affecting left non-dominant side: Secondary | ICD-10-CM | POA: Diagnosis not present

## 2015-10-19 DIAGNOSIS — I119 Hypertensive heart disease without heart failure: Secondary | ICD-10-CM | POA: Diagnosis not present

## 2015-10-19 DIAGNOSIS — E1151 Type 2 diabetes mellitus with diabetic peripheral angiopathy without gangrene: Secondary | ICD-10-CM | POA: Diagnosis not present

## 2015-10-24 DIAGNOSIS — I4891 Unspecified atrial fibrillation: Secondary | ICD-10-CM | POA: Diagnosis not present

## 2015-10-24 DIAGNOSIS — Z7901 Long term (current) use of anticoagulants: Secondary | ICD-10-CM | POA: Diagnosis not present

## 2015-10-24 DIAGNOSIS — K5900 Constipation, unspecified: Secondary | ICD-10-CM | POA: Diagnosis not present

## 2015-11-12 ENCOUNTER — Emergency Department (HOSPITAL_COMMUNITY)
Admission: EM | Admit: 2015-11-12 | Discharge: 2015-11-12 | Disposition: A | Discharge status: Home / Self Care | Payer: Medicare Other | Attending: Emergency Medicine | Admitting: Emergency Medicine

## 2015-11-12 ENCOUNTER — Emergency Department (HOSPITAL_COMMUNITY): Payer: Medicare Other

## 2015-11-12 ENCOUNTER — Encounter (HOSPITAL_COMMUNITY): Payer: Self-pay | Admitting: *Deleted

## 2015-11-12 DIAGNOSIS — Z87891 Personal history of nicotine dependence: Secondary | ICD-10-CM | POA: Insufficient documentation

## 2015-11-12 DIAGNOSIS — I482 Chronic atrial fibrillation, unspecified: Secondary | ICD-10-CM

## 2015-11-12 DIAGNOSIS — K219 Gastro-esophageal reflux disease without esophagitis: Secondary | ICD-10-CM | POA: Insufficient documentation

## 2015-11-12 DIAGNOSIS — Z7901 Long term (current) use of anticoagulants: Secondary | ICD-10-CM | POA: Diagnosis not present

## 2015-11-12 DIAGNOSIS — Z7951 Long term (current) use of inhaled steroids: Secondary | ICD-10-CM | POA: Diagnosis not present

## 2015-11-12 DIAGNOSIS — Z8701 Personal history of pneumonia (recurrent): Secondary | ICD-10-CM | POA: Insufficient documentation

## 2015-11-12 DIAGNOSIS — Z8669 Personal history of other diseases of the nervous system and sense organs: Secondary | ICD-10-CM | POA: Diagnosis not present

## 2015-11-12 DIAGNOSIS — L089 Local infection of the skin and subcutaneous tissue, unspecified: Secondary | ICD-10-CM | POA: Diagnosis not present

## 2015-11-12 DIAGNOSIS — I493 Ventricular premature depolarization: Secondary | ICD-10-CM | POA: Insufficient documentation

## 2015-11-12 DIAGNOSIS — J449 Chronic obstructive pulmonary disease, unspecified: Secondary | ICD-10-CM | POA: Insufficient documentation

## 2015-11-12 DIAGNOSIS — M7989 Other specified soft tissue disorders: Secondary | ICD-10-CM | POA: Diagnosis not present

## 2015-11-12 DIAGNOSIS — I2510 Atherosclerotic heart disease of native coronary artery without angina pectoris: Secondary | ICD-10-CM | POA: Insufficient documentation

## 2015-11-12 DIAGNOSIS — M79675 Pain in left toe(s): Secondary | ICD-10-CM | POA: Diagnosis not present

## 2015-11-12 DIAGNOSIS — Z79899 Other long term (current) drug therapy: Secondary | ICD-10-CM | POA: Diagnosis not present

## 2015-11-12 DIAGNOSIS — R222 Localized swelling, mass and lump, trunk: Secondary | ICD-10-CM | POA: Diagnosis not present

## 2015-11-12 DIAGNOSIS — M170 Bilateral primary osteoarthritis of knee: Secondary | ICD-10-CM | POA: Insufficient documentation

## 2015-11-12 DIAGNOSIS — Z9889 Other specified postprocedural states: Secondary | ICD-10-CM | POA: Diagnosis not present

## 2015-11-12 DIAGNOSIS — Z8673 Personal history of transient ischemic attack (TIA), and cerebral infarction without residual deficits: Secondary | ICD-10-CM | POA: Diagnosis not present

## 2015-11-12 DIAGNOSIS — Z7984 Long term (current) use of oral hypoglycemic drugs: Secondary | ICD-10-CM | POA: Diagnosis not present

## 2015-11-12 LAB — CBC WITH DIFFERENTIAL/PLATELET
BASOS PCT: 0 %
Basophils Absolute: 0 10*3/uL (ref 0.0–0.1)
EOS ABS: 0 10*3/uL (ref 0.0–0.7)
Eosinophils Relative: 0 %
HCT: 37.2 % — ABNORMAL LOW (ref 39.0–52.0)
Hemoglobin: 12.3 g/dL — ABNORMAL LOW (ref 13.0–17.0)
Lymphocytes Relative: 27 %
Lymphs Abs: 2.2 10*3/uL (ref 0.7–4.0)
MCH: 33.3 pg (ref 26.0–34.0)
MCHC: 33.1 g/dL (ref 30.0–36.0)
MCV: 100.8 fL — ABNORMAL HIGH (ref 78.0–100.0)
MONO ABS: 1.2 10*3/uL — AB (ref 0.1–1.0)
Monocytes Relative: 15 %
NEUTROS ABS: 4.8 10*3/uL (ref 1.7–7.7)
Neutrophils Relative %: 58 %
PLATELETS: 146 10*3/uL — AB (ref 150–400)
RBC: 3.69 MIL/uL — ABNORMAL LOW (ref 4.22–5.81)
RDW: 13.9 % (ref 11.5–15.5)
WBC: 8.2 10*3/uL (ref 4.0–10.5)

## 2015-11-12 LAB — COMPREHENSIVE METABOLIC PANEL
ALBUMIN: 3.4 g/dL — AB (ref 3.5–5.0)
ALK PHOS: 83 U/L (ref 38–126)
ALT: 22 U/L (ref 17–63)
AST: 28 U/L (ref 15–41)
Anion gap: 10 (ref 5–15)
BILIRUBIN TOTAL: 0.4 mg/dL (ref 0.3–1.2)
BUN: 41 mg/dL — ABNORMAL HIGH (ref 6–20)
CALCIUM: 9.4 mg/dL (ref 8.9–10.3)
CO2: 23 mmol/L (ref 22–32)
CREATININE: 1.06 mg/dL (ref 0.61–1.24)
Chloride: 107 mmol/L (ref 101–111)
GFR calc non Af Amer: >60 mL/min (ref >60)
GLUCOSE: 116 mg/dL — AB (ref 65–99)
Potassium: 4.7 mmol/L (ref 3.5–5.1)
Sodium: 140 mmol/L (ref 135–145)
TOTAL PROTEIN: 7 g/dL (ref 6.5–8.1)

## 2015-11-12 MED ORDER — AMOXICILLIN-POT CLAVULANATE 875-125 MG PO TABS: 1.0000 | ORAL_TABLET | Freq: Two times a day (BID) | ORAL | Status: DC

## 2015-11-12 MED ORDER — AMOXICILLIN-POT CLAVULANATE 875-125 MG PO TABS
1.0000 | ORAL_TABLET | Freq: Once | ORAL | Status: AC
  Administered 2015-11-12: 1 via ORAL
  Filled 2015-11-12: qty 1

## 2015-11-12 NOTE — ED Notes (Signed)
Pt had great toe amputated approx 1 month ago. Second toe suspected infection.

## 2015-11-12 NOTE — ED Provider Notes (Signed)
CSN: [id number]     Arrival date & time 11/12/15  1009 History   First MD Initiated Contact with Patient 11/12/15 1128     Chief Complaint  Patient presents with  . Toe Pain     (Consider location/radiation/quality/duration/timing/severity/associated sxs/prior Treatment) HPI   [name] is a 82 y.o. male here for evaluation of infection of toe of the left foot. He had his great toe amputated, to months ago, for ischemia. He has had peripheral vascular stents, of the left leg. He has chronic atrial fibrillation and is on warfarin treatment. He has not had weakness, syncope, nausea, vomiting, fever or chills. He is taking his usual medications. There are no other known modifying factors.   Past Medical History  Diagnosis Date  . Dysrhythmia     atrial fibrilation  . Hypertension   . Shortness of breath   . Claudication of calf muscles ([place]) 11/05/2012    right calf  . GERD (gastroesophageal reflux disease)   . TIA (transient ischemic attack) 2010  . Peripheral vascular disease ([place])   . On home oxygen therapy     "2L; w/activity" (02/24/2014)  . Neuromuscular disorder (H[facility])     dizziness  . Macular degeneration, bilateral     "has had shots in both eyes"  . Coronary artery disease   . COPD (chronic obstructive pulmonary disease) ([place])   . Type II diabetes mellitus ([place])   . Stroke ([place]) 05/2013    "can't use left arm fully since"  . Arthritis     "legs" (02/24/2014)  . Pneumonia     "5 times back to back recently (just finished antibiiotic) " (02/24/2014)  . Cellulitis and abscess of toe 08/2015    LEFT FOOT    Past Surgical History  Procedure Laterality Date  . Removal of pleural drainage catheter Left 05/03/2014    Procedure: REMOVAL OF PLEURAL DRAINAGE CATHETER;  Surgeon: Ivin Poot, MD;  Location: [place];  Service: Thoracic;  Laterality: Left;  . Foot surgery Left     due to broken foot years ago  . Esophagogastroduodenoscopy N/A 11/02/2013    Procedure:  ESOPHAGOGASTRODUODENOSCOPY (EGD);  Surgeon: Cleotis Nipper, MD;  Location: [facility] ENDOSCOPY;  Service: Endoscopy;  Laterality: N/A;  . Vascular surgery      stents to legs  . Cardiac catheterization    . Transurethral resection of prostate    . Femoral artery stent Left 10/2012  . Chest tube insertion Left 02/24/2014    Procedure: INSERTION PLEURAL DRAINAGE CATHETER;  Surgeon: Ivin Poot, MD;  Location: [place];  Service: Thoracic;  Laterality: Left;  . Esophagogastroduodenoscopy (egd) with propofol N/A 07/06/2014    Procedure: ESOPHAGOGASTRODUODENOSCOPY (EGD) WITH PROPOFOL;  Surgeon: Cleotis Nipper, MD;  Location: [place];  Service: Endoscopy;  Laterality: N/A;  . Lower extremity angiogram N/A 11/06/2012    Procedure: LOWER EXTREMITY ANGIOGRAM;  Surgeon: Laverda Page, MD;  Location: [facility] CATH LAB;  Service: Cardiovascular;  Laterality: N/A;  . Amputation Left 09/12/2015    Procedure: LEFT GREAT TOE AMPUTATION;  Surgeon: Meredith Pel, MD;  Location: [place];  Service: Orthopedics;  Laterality: Left;   Family History  Problem Relation Age of Onset  . Cancer Neg Hx   . CAD Neg Hx   . Hypertension Other    Social History  Substance Use Topics  . Smoking status: Former Smoker -- 3.00 packs/day for 35 years    Types: Cigarettes    Quit date: 09/23/1981  .  Smokeless tobacco: Never Used  . Alcohol Use: Yes     Comment: QUITS YEARS AGO    Review of Systems  All other systems reviewed and are negative.     Allergies  Review of patient's allergies indicates no known allergies.  Home Medications   Prior to Admission medications   Medication Sig Start Date End Date Taking? Authorizing Provider  albuterol (PROVENTIL) (2.5 MG/3ML) 0.083% nebulizer solution Take 2.5 mg by nebulization 4 (four) times daily.   Yes Historical Provider, MD  allopurinol (ZYLOPRIM) 100 MG tablet Take 100 mg by mouth 2 (two) times daily.  06/28/14  Yes Historical Provider, MD  budesonide (PULMICORT)  0.5 MG/2ML nebulizer solution Take 0.5 mg by nebulization 2 (two) times daily as needed (for wheezing/shortness of breath).    Yes Historical Provider, MD  carvedilol (COREG) 6.25 MG tablet Take 6.25 mg by mouth 2 (two) times daily with a meal.   Yes Historical Provider, MD  cilostazol (PLETAL) 50 MG tablet Take 50 mg by mouth 2 (two) times daily.   Yes Historical Provider, MD  colchicine 0.6 MG tablet Take 1 tablet (0.6 mg total) by mouth 2 (two) times daily. 04/21/14  Yes Junius Creamer, NP  magnesium oxide (MAG-OX) 400 MG tablet Take 200 mg by mouth daily.   Yes Historical Provider, MD  Melatonin 3 MG TABS Take 3 mg by mouth at bedtime as needed (sleep).    Yes Historical Provider, MD  metFORMIN (GLUCOPHAGE) 1000 MG tablet Take 500 mg by mouth 2 (two) times daily with a meal.   Yes Historical Provider, MD  mirtazapine (REMERON) 15 MG tablet Take 15 mg by mouth at bedtime.   Yes Historical Provider, MD  Multiple Vitamins-Minerals (OCUVITE PRESERVISION PO) Take 1 tablet by mouth daily.   Yes Historical Provider, MD  pantoprazole (PROTONIX) 40 MG tablet Take 1 tablet (40 mg total) by mouth 2 (two) times daily. 07/22/14  Yes Charlynne Cousins, MD  ranitidine (ZANTAC) 300 MG tablet Take 300 mg by mouth at bedtime.   Yes Historical Provider, MD  simvastatin (ZOCOR) 40 MG tablet Take 40 mg by mouth every evening.   Yes Historical Provider, MD  tiotropium (SPIRIVA) 18 MCG inhalation capsule Place 18 mcg into inhaler and inhale daily.   Yes Historical Provider, MD  vitamin B-12 (CYANOCOBALAMIN) 1000 MCG tablet Take 1,000 mcg by mouth daily.   Yes Historical Provider, MD  warfarin (COUMADIN) 5 MG tablet Take 1 tablet by mouth daily. 07/06/15  Yes Historical Provider, MD   BP 124/90 mmHg  Pulse 43  Temp(Src) 98.6 F (37 C) (Oral)  Resp 22  SpO2 92% Physical Exam  Constitutional: He is oriented to person, place, and time. He appears well-developed and well-nourished.  HENT:  Head: Normocephalic and  atraumatic.  Right Ear: External ear normal.  Left Ear: External ear normal.  Eyes: Conjunctivae and EOM are normal. Pupils are equal, round, and reactive to light.  Neck: Normal range of motion and phonation normal. Neck supple.  Cardiovascular: Regular rhythm and normal heart sounds.   Bradycardic with irregular rhythm. Palpable pulse right radial artery 50-60 beats per minutes.  Pulmonary/Chest: Effort normal and breath sounds normal. He exhibits no bony tenderness.  Abdominal: Soft. There is no tenderness.  Musculoskeletal: Normal range of motion.  Left foot, recent amputation bed of the great toe, appears normal. Second toe is somewhat swollen, pink in color, nontender to palpation. Left foot is mildly swollen. There is no ascending erythema or palpable fluctuance  or drainage.  Neurological: He is alert and oriented to person, place, and time. No cranial nerve deficit or sensory deficit. He exhibits normal muscle tone. Coordination normal.  Skin: Skin is warm, dry and intact.  Psychiatric: He has a normal mood and affect. His behavior is normal. Judgment and thought content normal.  Nursing note and vitals reviewed.   ED Course  Procedures (including critical care time) Medications  amoxicillin-clavulanate (AUGMENTIN) 875-125 MG per tablet 1 tablet (1 tablet Oral Given 11/12/15 1253)    Patient Vitals for the past 24 hrs:  BP Temp Temp src Pulse Resp SpO2  11/12/15 1330 124/90 mmHg - - - 22 -  11/12/15 1300 137/71 mmHg - - - - -  11/12/15 1200 124/70 mmHg - - (!) 43 - 92 %  11/12/15 1145 126/75 mmHg - - (!) 33 - 93 %  11/12/15 1027 110/61 mmHg 98.6 F (37 C) Oral 63 16 93 %    1:44 PM Reevaluation with update and discussion. After initial assessment and treatment, an updated evaluation reveals heart rate is now 95-110, irregular. EKG done, consistent with atrial fibrillation and PVCs. Otherwise, clinical evaluation is unchanged. Findings discussed with patient, and wife, all  questions were answered. Nathalya Wolanski L     Labs Review Labs Reviewed  COMPREHENSIVE METABOLIC PANEL - Abnormal; Notable for the following:    Glucose, Bld 116 (*)    BUN 41 (*)    Albumin 3.4 (*)    All other components within normal limits  CBC WITH DIFFERENTIAL/PLATELET - Abnormal; Notable for the following:    RBC 3.69 (*)    Hemoglobin 12.3 (*)    HCT 37.2 (*)    MCV 100.8 (*)    Platelets 146 (*)    Monocytes Absolute 1.2 (*)    All other components within normal limits    Imaging Review Dg Chest 2 View  11/12/2015  CLINICAL DATA:  Swelling for 2 months EXAM: CHEST  2 VIEW COMPARISON:  09/11/2015 FINDINGS: Calcified granuloma at the right base. Mild cardiomegaly. Bibasilar atelectasis. Right pleural changes are chronic. IMPRESSION: No active cardiopulmonary disease. Electronically Signed   By: Marybelle Killings M.D.   On: 11/12/2015 12:47   Dg Toe 2nd Left  11/12/2015  CLINICAL DATA:  Pt c/o L second toe and foot pain and swelling x 2 months. Pt had his great toe removed in January 2017. Pt also c/o SOB and has a productive cough. Hx of HTN, DM, COPD, CAD, and PNA. Pt is a former smoker. EXAM: LEFT SECOND TOE COMPARISON:  09/04/2015 FINDINGS: Since the prior study, the great toes been resected. There is soft tissue swelling surrounding the second toe. There is no bone resorption to suggest osteomyelitis. There is no fracture. There is a small chronic marginal erosion along the lateral margin of the fifth metatarsal head, stable from the prior exam. Joints are normally aligned. Bones are demineralized. There are vascular calcifications noted along the digital arteries. IMPRESSION: 1. Left second toe soft tissue swelling, but no fracture or evidence of osteomyelitis. Electronically Signed   By: Lajean Manes M.D.   On: 11/12/2015 12:48   I have personally reviewed and evaluated these images and lab results as part of my medical decision-making.   EKG Interpretation   Date/Time:   Sunday November 12 2015 13:29:11 EST Ventricular Rate:  125 PR Interval:    QRS Duration: 77 QT Interval:  308 QTC Calculation: 444 R Axis:   98 Text Interpretation:  Atrial fibrillation  Paired ventricular premature  complexes Probable anterior infarct, age indeterminate Since last tracing  Premature ventricular complexes have now recurred Confirmed by [facility]  MD,  Jacaden Forbush [phone]) on 11/12/2015 1:42:58 PM      MDM   Final diagnoses:  Toe infection  Chronic atrial fibrillation (HCC)  PVC (premature ventricular contraction)    Infected toe with peripheral vascular disease. No frank ischemia of the extremities. Likely local ischemia, left second toe versus infection left toe. He is hemodynamically stable, with low heart rate and atrial fibrillation which is chronic. Heart rate is somewhat low, with apparent nonperfused PVCs, which are frequent,  but he is not having weakness or syncope. No intervention is necessary for cardiac abnormality. Doubt sepsis, metabolic instability or impending vascular collapse.  Nursing Notes Reviewed/ Care Coordinated Applicable Imaging Reviewed Interpretation of Laboratory Data incorporated into ED treatment  The patient appears reasonably screened and/or stabilized for discharge and I doubt any other medical condition or other [facility] requiring further screening, evaluation, or treatment in the ED at this time prior to discharge.  Plan: Home Medications- Augmentin; Home Treatments- warm compresses to left second toe; return here if the recommended treatment, does not improve the symptoms; Recommended follow up- follow up with orthopedics, next week as scheduled. Consider following up with cardiology regarding the atrial fibrillation and PVCs.    Daleen Bo, MD 11/12/15 [phone]

## 2015-11-12 NOTE — ED Notes (Signed)
EDP aware of pt persistent HR in the 30s. Verbal order obtained for EKG.

## 2015-11-12 NOTE — ED Notes (Signed)
Pt reports redeness and swelling to left toe and foot. Pt noted to have reddness, swelling and heat to area. Pt has already had his great toe amputated on the same foot. Pt has <3second cap refill and sensation intact.

## 2015-11-12 NOTE — Discharge Instructions (Signed)
Use warm compress on the left second toe for infection, 3 or 4 times a day. Consider seeing your cardiologist for the atrial fibrillation with premature ventricular contractions. Return here if needed, for problems.    Atrial Fibrillation Atrial fibrillation is a type of irregular or rapid heartbeat (arrhythmia). In atrial fibrillation, the heart quivers continuously in a chaotic pattern. This occurs when parts of the heart receive disorganized signals that make the heart unable to pump blood normally. This can increase the risk for stroke, heart failure, and other heart-related conditions. There are different types of atrial fibrillation, including:  Paroxysmal atrial fibrillation. This type starts suddenly, and it usually stops on its own shortly after it starts.  Persistent atrial fibrillation. This type often lasts longer than a week. It may stop on its own or with treatment.  Long-lasting persistent atrial fibrillation. This type lasts longer than 12 months.  Permanent atrial fibrillation. This type does not go away. Talk with your health care provider to learn about the type of atrial fibrillation that you have. CAUSES This condition is caused by some heart-related conditions or procedures, including:  A heart attack.  Coronary artery disease.  Heart failure.  Heart valve conditions.  High blood pressure.  Inflammation of the sac that surrounds the heart (pericarditis).  Heart surgery.  Certain heart rhythm disorders, such as Wolf-Parkinson-White syndrome. Other causes include:  Pneumonia.  Obstructive sleep apnea.  Blockage of an artery in the lungs (pulmonary embolism, or PE).  Lung cancer.  Chronic lung disease.  Thyroid problems, especially if the thyroid is overactive (hyperthyroidism).  Caffeine.  Excessive alcohol use or illegal drug use.  Use of some medicines, including certain decongestants and diet pills. Sometimes, the cause cannot be  found. RISK FACTORS This condition is more likely to develop in:  People who are older in age.  People who smoke.  People who have diabetes mellitus.  People who are overweight (obese).  Athletes who exercise vigorously. SYMPTOMS Symptoms of this condition include:  A feeling that your heart is beating rapidly or irregularly.  A feeling of discomfort or pain in your chest.  Shortness of breath.  Sudden light-headedness or weakness.  Getting tired easily during exercise. In some cases, there are no symptoms. DIAGNOSIS Your health care provider may be able to detect atrial fibrillation when taking your pulse. If detected, this condition may be diagnosed with:  An electrocardiogram (ECG).  A Holter monitor test that records your heartbeat patterns over a 24-hour period.  Transthoracic echocardiogram (TTE) to evaluate how blood flows through your heart.  Transesophageal echocardiogram (TEE) to view more detailed images of your heart.  A stress test.  Imaging tests, such as a CT scan or chest X-ray.  Blood tests. TREATMENT The main goals of treatment are to prevent blood clots from forming and to keep your heart beating at a normal rate and rhythm. The type of treatment that you receive depends on many factors, such as your underlying medical conditions and how you feel when you are experiencing atrial fibrillation. This condition may be treated with:  Medicine to slow down the heart rate, bring the heart's rhythm back to normal, or prevent clots from forming.  Electrical cardioversion. This is a procedure that resets your heart's rhythm by delivering a controlled, low-energy shock to the heart through your skin.  Different types of ablation, such as catheter ablation, catheter ablation with pacemaker, or surgical ablation. These procedures destroy the heart tissues that send abnormal signals. When the  pacemaker is used, it is placed under your skin to help your heart  beat in a regular rhythm. HOME CARE INSTRUCTIONS  Take over-the counter and prescription medicines only as told by your health care provider.  If your health care provider prescribed a blood-thinning medicine (anticoagulant), take it exactly as told. Taking too much blood-thinning medicine can cause bleeding. If you do not take enough blood-thinning medicine, you will not have the protection that you need against stroke and other problems.  Do not use tobacco products, including cigarettes, chewing tobacco, and e-cigarettes. If you need help quitting, ask your health care provider.  If you have obstructive sleep apnea, manage your condition as told by your health care provider.  Do not drink alcohol.  Do not drink beverages that contain caffeine, such as coffee, soda, and tea.  Maintain a healthy weight. Do not use diet pills unless your health care provider approves. Diet pills may make heart problems worse.  Follow diet instructions as told by your health care provider.  Exercise regularly as told by your health care provider.  Keep all follow-up visits as told by your health care provider. This is important. PREVENTION  Avoid drinking beverages that contain caffeine or alcohol.  Avoid certain medicines, especially medicines that are used for breathing problems.  Avoid certain herbs and herbal medicines, such as those that contain ephedra or ginseng.  Do not use illegal drugs, such as cocaine and amphetamines.  Do not smoke.  Manage your high blood pressure. SEEK MEDICAL CARE IF:  You notice a change in the rate, rhythm, or strength of your heartbeat.  You are taking an anticoagulant and you notice increased bruising.  You tire more easily when you exercise or exert yourself. SEEK IMMEDIATE MEDICAL CARE IF:  You have chest pain, abdominal pain, sweating, or weakness.  You feel nauseous.  You notice blood in your vomit, bowel movement, or urine.  You have shortness  of breath.  You suddenly have swollen feet and ankles.  You feel dizzy.  You have sudden weakness or numbness of the face, arm, or leg, especially on one side of the body.  You have trouble speaking, trouble understanding, or both (aphasia).  Your face or your eyelid droops on one side. These symptoms may represent a serious problem that is an emergency. Do not wait to see if the symptoms will go away. Get medical help right away. Call your local emergency services (911 in the [place] Do not drive yourself to the hospital.   This information is not intended to replace advice given to you by your health care provider. Make sure you discuss any questions you have with your health care provider.   Document Released: 09/09/2005 Document Revised: 05/31/2015 Document Reviewed: 01/04/2015 Elsevier Interactive Patient Education [employer].

## 2015-11-15 DIAGNOSIS — S91209A Unspecified open wound of unspecified toe(s) with damage to nail, initial encounter: Secondary | ICD-10-CM | POA: Diagnosis not present

## 2015-11-16 DIAGNOSIS — I739 Peripheral vascular disease, unspecified: Secondary | ICD-10-CM | POA: Diagnosis not present

## 2015-11-16 DIAGNOSIS — I482 Chronic atrial fibrillation: Secondary | ICD-10-CM | POA: Diagnosis not present

## 2015-11-16 DIAGNOSIS — I998 Other disorder of circulatory system: Secondary | ICD-10-CM | POA: Diagnosis not present

## 2015-11-16 DIAGNOSIS — E1151 Type 2 diabetes mellitus with diabetic peripheral angiopathy without gangrene: Secondary | ICD-10-CM | POA: Diagnosis not present

## 2015-11-19 ENCOUNTER — Other Ambulatory Visit: Payer: Self-pay | Admitting: Cardiology

## 2015-11-19 DIAGNOSIS — I998 Other disorder of circulatory system: Secondary | ICD-10-CM | POA: Diagnosis present

## 2015-11-19 DIAGNOSIS — I70229 Atherosclerosis of native arteries of extremities with rest pain, unspecified extremity: Secondary | ICD-10-CM | POA: Diagnosis present

## 2015-11-19 NOTE — H&P (Signed)
OFFICE VISIT NOTES COPIED TO EPIC FOR DOCUMENTATION  . [facility] November 17, 2015 3:16 PM Location: [facility] Patient #: [id number] DOB: 03-14-1928 Divorced / Language: Cleophus Molt / Race: White Male   History of Present Illness Neldon Labella AGNP-C; 11-17-15 4:44 PM) Patient words: Dr Maudie Mercury manages his INR but he ask if we could check it today. INR today is 3.2.  The patient is a 82 year old male who presents for a Follow-up for Peripheral vascular disease. He has a history of CVA on 07/06/2013, confirmed by MRI, after presenting with gait ataxia and left upper extremity weakness, which persists, chronic atrial fibrillation on chronic warfarin therapy, hypertension, diabetes, and hyperlipidemia with a significant history of PAD. On 11/06/2012, he underwent PTA and stenting of the right SFA. He was last seen in the office on 06/03/2013.  He underwent amputation of his left great toe on 09/12/2015 due to cellulitis. He saw Dr. Sharol Given to yesterday for reevaluation due to ischemic changes of 2nd digit of left foot. His caregiver with him reports that his toenail came off last week and he developed an ulcer to the top of the toe around the same time. He was given antibiotic and nitroglycerin patches to place on his foot to attempt to increase perfusion and was referred here on a STAT basis for evaluation of possible revascularization options. He denies any pain in either leg. Although he can ambulate short distances, he is primarily wheelchair-bound.   Problem List/Past Medical (April Garrison; 11-17-2015 3:32 PM) Atrial fibrillation (I48.91) EKG 06/03/2013: Atrial fibrillation with ventricular response rate of 76 bpm, anterior wall infarct, old nonspecific T-wave flattening. No change from EKG 10/20/2012 Echo 10/29/12 - Mild conc. LVH,EF-55%, Mild Biatrial enlargement,Trace AR,Mild MR,TR.Trace aortic regurgitation. Mild aortic valve leaflet calcification. Aortic valve leaflet mobility is  mildly restricted. Tricuspid valve structurally normal. Mild tricuspid regurgitation. Mild pulmonary hypertension. Nulcear stress 10/30/12: Resting EKG A. Fibrillation, Poor R wave progression, non specific ST-T change. Stress EKG was non diagnostic for ischemia. Perfusion imaging study demonstrated mild soft tissue attenuation consistent with diaphragmatic attenuation. There was no e/o ischemia or scar. The left ventricular systolic function was 75%. This is a low risk study. Benign essential HTN (I10) Diabetes mellitus with peripheral autonomic neuropathy (E11.43) Diabetes mellitus with peripheral artery disease (E11.51) Labs done on 09/29/2012: Hemoglobin A1c was 5.9%. Hyperlipidemia (E78.5) Labs done on 09/29/2012 revealed normal urinalysis. CBC, CMP was normal. Hemoglobin A1c was 5.9%. Total cholesterol 96, triglycerides 69, HDL 38, LDL 44. PSA was normal. Claudication of calf muscles (I73.9) Pulsatile abdominal mass (R19.00) Acid reflux (K21.9) Emphysema/COPD (J43.9)  Allergies (April Garrison; 2015-11-17 3:18 PM) No Known Drug Allergies01/28/2014  Family History (April Garrison; 17-Nov-2015 3:30 PM) Mother Deceased. at age 54 from old age (no known heart conditions) Father Deceased. in his 70's from old age (no known heart conditions) Siblings 7 (1- living)-no known heart conditions.  Social History (April Garrison; 17-Nov-2015 3:18 PM) Current tobacco use Never smoker. quit in 1985 Non Drinker/No Alcohol Use Marital status Divorced. Living Situation Lives with girlfriend. Number of Children 7.  Past Surgical History (April Garrison; November 17, 2015 3:18 PM) Prostate Surgery2008 unblock gland  Medication History (April Garrison; 2015-11-17 3:49 PM) Coreg (6.'25MG'$  Tablet, 1 (one) Tablet Tablet Oral two times daily, Taken starting 03/28/2014) Active. Pletal ('50MG'$  Tablet, 1 (one) Tablet Tablet Oral two times daily, Taken starting 03/28/2014) Active. Warfarin Sodium ('5MG'$  Tablet,  Oral, Taken starting 2009) Active. (Managed at Coumadin clinic Dr. [name].) Benicar ('40MG'$  Tablet, 1 Oral daily)  Discontinued: by PCP. Atorvastatin Calcium ('20MG'$  Tablet, 1 Oral daily) Discontinued: switched to Simvastatin. Omeprazole ('20MG'$  Capsule DR, 1 Oral daily) Discontinued: switched to Pantoprazole. AmLODIPine Besylate ('5MG'$  Tablet, 1 1/2 tablets Oral daily) Discontinued: by PCP. MetFORMIN HCl ('1000MG'$  Tablet, 1/2 Oral two times daily) Active. Simvastatin ('40MG'$  Tablet, 1 Oral daily) Active. GlipiZIDE ER ('5MG'$  Tablet ER 24HR, 1 Oral daily) Discontinued: by PCP. Spiriva HandiHaler (18MCG Capsule, 1 capsule Inhalation daily) Active. Allergy Relief ('10MG'$  Tablet, Oral as needed) Discontinued: Condition Improvement. PreserVision AREDS (Oral as directed) Active. Amoxicillin-Pot Clavulanate (875-'125MG'$  Tablet, 1 Oral two times daily for 7 days, Taken starting 11/12/2015) Active. (for infected toe) Mirtazapine ('15MG'$  Tablet, 1/2 - 1 Oral daily) Active. MAGnesium-Oxide (400 (241.3 Mg)MG Tablet, 1/2 Oral daily) Active. Pantoprazole Sodium ('40MG'$  Tablet DR, 1 Oral daily) Active. Nitroglycerin (0.'2MG'$ /HR Patch 24HR, 1 Transdermal daily) Active. Melatonin ('3MG'$  Capsule, 1 Oral as needed) Active. Stool Softener ('100MG'$  Capsule, 1 Oral as needed) Active. Medications Reconciled  Diagnostic Studies History Laverda Page, MD; 11/16/2015 5:00 PM) Treadmill stress test over 10 years ago Echocardiogram02/02/2013 Nuclear stress test02/03/2013 Colonoscopy2012 Normal. Labwork Labs 11/12/2015: Serum glucose 116 mg, nonfasting. BUN 41, serum creatine 1.06, eGFR greater than 60 mL. Albumin 3.4. Hemoglobin 12.3/hematocrit 7.2, white count 8.2, platelet count 146. Labs 09/13/2015: TSH normal at 1.847, HbA1c 5.8%. ABI12/20/2016 Bilateral ABI noncompressible. TBI abnormal right, unobtainable left. Monophasic waveforms. Blood pressure differential, right brachial significantly lower compared to  left by 34 mmHg.  Other Problems (April Louretta Shorten; 11/16/2015 3:32 PM) H/O Doppler ultrasound (Z92.89) Abdominal aortic duplex 11/16/12: Mild sclerosis noted in the mid and distal aorta. No AAA observed. normal iliac artery velocity. Abnormal ultrasound of lower extremity (R93.6) TIA 2012    Review of Systems Laverda Page, MD; 11/16/2015 8:30 PM) General Present- Feeling well. Not Present- Tiredness and Unable to Sleep Lying Flat. HEENT Not Present- Blurred Vision. Respiratory Present- Difficulty Breathing on Exertion. Not Present- Bloody sputum and Wakes up from Sleep Wheezing or Short of Breath. Cardiovascular Not Present- Edema, Palpitations and Paroxysmal Nocturnal Dyspnea. Gastrointestinal Not Present- Black, Tarry Stool, Bloody Stool and Heartburn. Musculoskeletal Not Present- Joint Pain. Neurological Not Present- Focal Neurological Symptoms. Psychiatric Not Present- Personality Changes and Suicidal Ideation. Hematology Not Present- Blood Clots, Easy Bruising and Nose Bleed.  Vitals (April Garrison; 11/16/2015 3:35 PM) 11/16/2015 3:21 PM Weight: 139.19 lb Height: 71in Body Surface Area: 1.81 m Body Mass Index: 19.41 kg/m  Pulse: 63 (Regular)  P.OX: 91% (Room air) BP: 115/64 (Sitting, Left Arm, Standard)       Physical Exam (Bridgette Ebony Hail AGNP-C; 11/16/2015 4:28 PM) The physical exam findings are as follows: Note:Exam performed in wheelchair  General Mental Status-Alert. General Appearance-Cooperative, Appears stated age, Not in acute distress. Build & Nutrition-Cachectic.  Head and Neck Thyroid Gland Characteristics - no palpable nodules, no palpable enlargement.  Chest and Lung Exam Palpation Tender - No chest wall tenderness. Auscultation Breath sounds - Clear.  Cardiovascular Inspection Jugular vein - Right - No Distention. Auscultation Rhythm - Irregularly irregular. Heart Sounds - S1 is variable, S2 WNL and No gallop present.  Murmurs & Other Heart Sounds - Murmur - No murmur.  Abdomen Palpation/Percussion Palpation and Percussion of the abdomen reveal - Non Tender and No hepatosplenomegaly. Auscultation Auscultation of the abdomen reveals - Bowel sounds normal. Abdominal Bruit - Periumbilical.  Peripheral Vascular Lower Extremity Inspection - Left - Digital ulcers(2nd toe), Erythematous, Ischemic, Loss of hair and Thick rigid nails. Right - Ischemic and Loss of hair. Bilateral - Inspection  Normal. Palpation - Edema - Left - Trace edema. Right - No edema. Femoral pulse - Left - Absent. Right - 1+. Popliteal pulse - Bilateral - Absent. Dorsalis pedis pulse - Bilateral - Absent. Posterior tibial pulse - Bilateral - Absent. Carotid arteries - Bilateral-No Carotid bruit. Abdomen-No prominent abdominal aortic pulsation, No epigastric bruit.  Neurologic Neurologic evaluation reveals -alert and oriented x 3 with no impairment of recent or remote memory. Motor-Grossly intact without any focal deficits.  Musculoskeletal Global Assessment Left Lower Extremity - normal range of motion without pain. Right Lower Extremity - normal range of motion without pain.    Assessment & Plan Laverda Page MD; 11/16/2015 8:30 PM) Critical lower limb ischemia (I99.8) Story: Peripheral angiography 11/06/2012: PTA and stenting of the right SFA with a 6.0 x 40 mm self-expanding Smart stent. Three-vessel runoff below the knee. The anterior and posterior tibial vessels with severe duffuse disease with tandem 80-90% stensis. Peroneal artery dominant and large. Left lower extremity no significant high-grade stenosis, mild to moderate diffuse disease.  Chronic atrial fibrillation (I48.2) Story: CHA2DS2-VASc Score is 7 with yearly risk of stroke of 9.6%. HAS-Bled score is 2 and estimated major bleeding in one year is 1.88-3.2% Impression: EKG 11/15/2014: Normal sinus rhythm at the rate of 66 bpm, normal axis. Poor R-wave  progression, pulmonary disease pattern. Low-voltage complexes. No evidence of ischemia.  EKG 06/03/2013: Atrial fibrillation with ventricular response rate of 76 bpm, anterior wall infarct, old nonspecific T-wave flattening. No change from EKG 10/20/2012 Current Plans Complete electrocardiogram (93000)  Peripheral artery disease (I73.9) Story: Peripheral angiography 11/06/2012: PTA and stenting of the right SFA with a 6.0 x 40 mm self-expanding Smart stent. Three-vessel runoff below the knee. The anterior and posterior tibial vessels with severe duffuse disease with tandem 80-90% stensis. Peroneal artery dominant and large. Left lower extremity no significant high-grade stenosis, mild to moderate diffuse disease. Impression: ABI [facility]) 09/12/2015: Bilateral ABI noncompressible. TBI abnormal right, unobtainable left. Monophasic waveforms. Blood pressure differential, right brachial significantly lower compared to left by 34 mmHg.  Diabetes mellitus with peripheral artery disease (E11.51) Labwork Story: 11/12/2015: Serum glucose 116 mg, nonfasting. BUN 41, serum creatine 1.06, eGFR greater than 60 mL. Albumin 3.4. Hemoglobin 12.3/hematocrit 7.2, white count 8.2, platelet count 146.  09/13/2015: TSH normal at 1.847, HbA1c 5.8%.  09/29/2012: HbA1c 5.9%, normal urinalysis. CBC, CMP normal. Total cholesterol 96, triglycerides 69, HDL 38, LDL 44. PSA normal.  Hyperlipidemia, group A (E78.00)  Benign essential HTN (I10)  Current Plans  Mechanism of underlying disease process and action of medications discussed with the patient. I have reviewed the results of the recently performed ABI in the hospital along with labs. Patient has limb threatening ischemia/critical limb ischemia involving the left lower extremity and absent axis in the right lower extremity. Patient needs urgent but not emergent peripheral arteriogram which I'll set up in 4 days unless he has any other significant changes in his symptoms  with worsening pain or worsening ulceration is advised to go to the emergency room. I discussed with the patient and his wife regarding risk of limb loss as he probably has severe diffuse small vessel disease during peripheral intervention. Patient understands the risks and benefits. He will hold his Coumadin for now and I have advised him to start aspirin until his procedures complete and he will be placed on anticoagulation. Extremely complex patient with multiple medical comorbidities. Wife present at bedside. This was a greater than 50 minute office visit with greater than  50% of the time spent with face-to-face encounter with patient and evaluation of complex medical issues.  CC Dr. [name]. Meridee Score, MD    Signed by Laverda Page, MD (11/16/2015 8:31 PM)

## 2015-11-20 ENCOUNTER — Encounter (HOSPITAL_COMMUNITY)
Admission: RE | Disposition: A | Discharge status: Home / Self Care | Payer: Self-pay | Source: Ambulatory Visit | Attending: Cardiology

## 2015-11-20 ENCOUNTER — Observation Stay (HOSPITAL_COMMUNITY)
Admission: RE | Admit: 2015-11-20 | Discharge: 2015-11-21 | Disposition: A | Discharge status: Home / Self Care | Payer: Medicare Other | Source: Ambulatory Visit | Attending: Cardiology | Admitting: Cardiology

## 2015-11-20 DIAGNOSIS — Z993 Dependence on wheelchair: Secondary | ICD-10-CM | POA: Diagnosis not present

## 2015-11-20 DIAGNOSIS — I70245 Atherosclerosis of native arteries of left leg with ulceration of other part of foot: Secondary | ICD-10-CM | POA: Diagnosis not present

## 2015-11-20 DIAGNOSIS — Z7901 Long term (current) use of anticoagulants: Secondary | ICD-10-CM | POA: Diagnosis not present

## 2015-11-20 DIAGNOSIS — Z8673 Personal history of transient ischemic attack (TIA), and cerebral infarction without residual deficits: Secondary | ICD-10-CM | POA: Insufficient documentation

## 2015-11-20 DIAGNOSIS — L97529 Non-pressure chronic ulcer of other part of left foot with unspecified severity: Secondary | ICD-10-CM | POA: Insufficient documentation

## 2015-11-20 DIAGNOSIS — E7800 Pure hypercholesterolemia, unspecified: Secondary | ICD-10-CM | POA: Insufficient documentation

## 2015-11-20 DIAGNOSIS — I482 Chronic atrial fibrillation: Secondary | ICD-10-CM | POA: Insufficient documentation

## 2015-11-20 DIAGNOSIS — I10 Essential (primary) hypertension: Secondary | ICD-10-CM | POA: Diagnosis not present

## 2015-11-20 DIAGNOSIS — I70229 Atherosclerosis of native arteries of extremities with rest pain, unspecified extremity: Secondary | ICD-10-CM | POA: Diagnosis present

## 2015-11-20 DIAGNOSIS — E11621 Type 2 diabetes mellitus with foot ulcer: Secondary | ICD-10-CM | POA: Diagnosis not present

## 2015-11-20 DIAGNOSIS — E1143 Type 2 diabetes mellitus with diabetic autonomic (poly)neuropathy: Secondary | ICD-10-CM | POA: Insufficient documentation

## 2015-11-20 DIAGNOSIS — I739 Peripheral vascular disease, unspecified: Secondary | ICD-10-CM

## 2015-11-20 DIAGNOSIS — E1151 Type 2 diabetes mellitus with diabetic peripheral angiopathy without gangrene: Principal | ICD-10-CM | POA: Insufficient documentation

## 2015-11-20 DIAGNOSIS — J439 Emphysema, unspecified: Secondary | ICD-10-CM | POA: Diagnosis not present

## 2015-11-20 DIAGNOSIS — I998 Other disorder of circulatory system: Secondary | ICD-10-CM | POA: Diagnosis present

## 2015-11-20 HISTORY — PX: PERIPHERAL VASCULAR CATHETERIZATION: SHX172C

## 2015-11-20 LAB — POCT ACTIVATED CLOTTING TIME
ACTIVATED CLOTTING TIME: 307 s
ACTIVATED CLOTTING TIME: 291 s
ACTIVATED CLOTTING TIME: 209 s
ACTIVATED CLOTTING TIME: 229 s
ACTIVATED CLOTTING TIME: 193 s

## 2015-11-20 LAB — BASIC METABOLIC PANEL
Anion gap: 9 (ref 5–15)
BUN: 27 mg/dL — AB (ref 6–20)
CHLORIDE: 108 mmol/L (ref 101–111)
CO2: 26 mmol/L (ref 22–32)
Calcium: 8.5 mg/dL — ABNORMAL LOW (ref 8.9–10.3)
Creatinine, Ser: 0.93 mg/dL (ref 0.61–1.24)
GFR calc Af Amer: >60 mL/min (ref >60)
GFR calc non Af Amer: >60 mL/min (ref >60)
GLUCOSE: 117 mg/dL — AB (ref 65–99)
POTASSIUM: 4.7 mmol/L (ref 3.5–5.1)
Sodium: 143 mmol/L (ref 135–145)

## 2015-11-20 LAB — GLUCOSE, CAPILLARY
GLUCOSE-CAPILLARY: 102 mg/dL — AB (ref 65–99)
GLUCOSE-CAPILLARY: 109 mg/dL — AB (ref 65–99)
GLUCOSE-CAPILLARY: 83 mg/dL (ref 65–99)
Glucose-Capillary: 99 mg/dL (ref 65–99)
Glucose-Capillary: 92 mg/dL (ref 65–99)

## 2015-11-20 LAB — CBC
HEMATOCRIT: 37.4 % — AB (ref 39.0–52.0)
Hemoglobin: 11.9 g/dL — ABNORMAL LOW (ref 13.0–17.0)
MCH: 31.9 pg (ref 26.0–34.0)
MCHC: 31.8 g/dL (ref 30.0–36.0)
MCV: 100.3 fL — AB (ref 78.0–100.0)
PLATELETS: 112 10*3/uL — AB (ref 150–400)
RBC: 3.73 MIL/uL — ABNORMAL LOW (ref 4.22–5.81)
RDW: 13.8 % (ref 11.5–15.5)
WBC: 3.8 10*3/uL — AB (ref 4.0–10.5)

## 2015-11-20 LAB — PROTIME-INR
INR: 1.67 — AB (ref 0.00–1.49)
INR: 1.62 — ABNORMAL HIGH (ref 0.00–1.49)
Prothrombin Time: 19.7 seconds — ABNORMAL HIGH (ref 11.6–15.2)
Prothrombin Time: 19.2 seconds — ABNORMAL HIGH (ref 11.6–15.2)

## 2015-11-20 LAB — HEMOGLOBIN AND HEMATOCRIT, BLOOD
HEMATOCRIT: 29.9 % — AB (ref 39.0–52.0)
HEMATOCRIT: 29.9 % — AB (ref 39.0–52.0)
HEMOGLOBIN: 9.5 g/dL — AB (ref 13.0–17.0)
HEMOGLOBIN: 9.6 g/dL — AB (ref 13.0–17.0)

## 2015-11-20 SURGERY — LOWER EXTREMITY ANGIOGRAPHY
Anesthesia: LOCAL

## 2015-11-20 MED ORDER — SODIUM CHLORIDE 0.9 % IV SOLN: 250.0000 mL | INTRAVENOUS | Status: DC | PRN

## 2015-11-20 MED ORDER — NITROGLYCERIN IN D5W 200-5 MCG/ML-% IV SOLN
INTRAVENOUS | Status: AC
  Filled 2015-11-20: qty 250

## 2015-11-20 MED ORDER — HEPARIN SODIUM (PORCINE) 1000 UNIT/ML IJ SOLN
INTRAMUSCULAR | Status: DC | PRN
  Administered 2015-11-20: 7000 [IU] via INTRAVENOUS
  Administered 2015-11-20: 2000 [IU] via INTRAVENOUS

## 2015-11-20 MED ORDER — LIDOCAINE HCL (PF) 1 % IJ SOLN
INTRAMUSCULAR | Status: AC
  Filled 2015-11-20: qty 30

## 2015-11-20 MED ORDER — MAGNESIUM OXIDE 400 MG PO TABS
200.0000 mg | ORAL_TABLET | Freq: Every day | ORAL | Status: DC
Start: 2015-11-21 — End: 2015-11-21
  Administered 2015-11-21: 200 mg via ORAL
  Filled 2015-11-20: qty 1
  Filled 2015-11-20: qty 0.5

## 2015-11-20 MED ORDER — HEPARIN (PORCINE) IN NACL 2-0.9 UNIT/ML-% IJ SOLN
INTRAMUSCULAR | Status: AC
  Filled 2015-11-20: qty 1000

## 2015-11-20 MED ORDER — METFORMIN HCL 500 MG PO TABS: 500.0000 mg | ORAL_TABLET | Freq: Two times a day (BID) | ORAL | Status: DC

## 2015-11-20 MED ORDER — ALBUTEROL SULFATE (2.5 MG/3ML) 0.083% IN NEBU
2.5000 mg | INHALATION_SOLUTION | Freq: Four times a day (QID) | RESPIRATORY_TRACT | Status: DC
  Administered 2015-11-20: 2.5 mg via RESPIRATORY_TRACT
  Filled 2015-11-20: qty 3

## 2015-11-20 MED ORDER — MIDAZOLAM HCL 2 MG/2ML IJ SOLN
INTRAMUSCULAR | Status: AC
  Filled 2015-11-20: qty 2

## 2015-11-20 MED ORDER — CARVEDILOL 3.125 MG PO TABS
6.2500 mg | ORAL_TABLET | Freq: Two times a day (BID) | ORAL | Status: DC
  Administered 2015-11-21: 11:00:00 6.25 mg via ORAL
  Filled 2015-11-20: qty 2

## 2015-11-20 MED ORDER — SODIUM CHLORIDE 0.9% FLUSH: 3.0000 mL | INTRAVENOUS | Status: DC | PRN

## 2015-11-20 MED ORDER — SODIUM CHLORIDE 0.9 % IV SOLN
1.0000 mL/kg/h | INTRAVENOUS | Status: DC
  Administered 2015-11-20: 1 mL/kg/h via INTRAVENOUS

## 2015-11-20 MED ORDER — HEPARIN SODIUM (PORCINE) 1000 UNIT/ML IJ SOLN
INTRAMUSCULAR | Status: AC
  Filled 2015-11-20: qty 1

## 2015-11-20 MED ORDER — FAMOTIDINE 20 MG PO TABS
20.0000 mg | ORAL_TABLET | Freq: Two times a day (BID) | ORAL | Status: DC
  Administered 2015-11-20 – 2015-11-21 (×2): 20 mg via ORAL
  Filled 2015-11-20 (×2): qty 1

## 2015-11-20 MED ORDER — PANTOPRAZOLE SODIUM 40 MG PO TBEC
40.0000 mg | DELAYED_RELEASE_TABLET | Freq: Two times a day (BID) | ORAL | Status: DC
  Administered 2015-11-20 – 2015-11-21 (×2): 40 mg via ORAL
  Filled 2015-11-20 (×2): qty 1

## 2015-11-20 MED ORDER — BUDESONIDE 0.5 MG/2ML IN SUSP
0.5000 mg | Freq: Two times a day (BID) | RESPIRATORY_TRACT | Status: DC | PRN
  Filled 2015-11-20: qty 2

## 2015-11-20 MED ORDER — FENTANYL CITRATE (PF) 100 MCG/2ML IJ SOLN
50.0000 ug | INTRAMUSCULAR | Status: DC | PRN
  Administered 2015-11-20 (×2): 25 ug via INTRAVENOUS
  Administered 2015-11-20: 50 ug via INTRAVENOUS

## 2015-11-20 MED ORDER — SODIUM CHLORIDE 0.9 % IV SOLN
INTRAVENOUS | Status: DC
  Administered 2015-11-20 (×2): 1000 mL via INTRAVENOUS

## 2015-11-20 MED ORDER — FENTANYL CITRATE (PF) 100 MCG/2ML IJ SOLN
INTRAMUSCULAR | Status: AC
  Filled 2015-11-20: qty 2

## 2015-11-20 MED ORDER — LIDOCAINE HCL (PF) 1 % IJ SOLN
INTRAMUSCULAR | Status: DC | PRN
  Administered 2015-11-20: 20 mL

## 2015-11-20 MED ORDER — VIPERSLIDE LUBRICANT OPTIME
TOPICAL | Status: DC | PRN
  Administered 2015-11-20: 09:00:00 via SURGICAL_CAVITY

## 2015-11-20 MED ORDER — CILOSTAZOL 100 MG PO TABS
50.0000 mg | ORAL_TABLET | Freq: Two times a day (BID) | ORAL | Status: DC
  Administered 2015-11-20 – 2015-11-21 (×2): 50 mg via ORAL
  Filled 2015-11-20 (×2): qty 1

## 2015-11-20 MED ORDER — MELATONIN 3 MG PO TABS
3.0000 mg | ORAL_TABLET | Freq: Every evening | ORAL | Status: DC | PRN
  Filled 2015-11-20: qty 1

## 2015-11-20 MED ORDER — FENTANYL CITRATE (PF) 100 MCG/2ML IJ SOLN
INTRAMUSCULAR | Status: AC
  Administered 2015-11-20: 50 ug via INTRAVENOUS
  Filled 2015-11-20: qty 2

## 2015-11-20 MED ORDER — METFORMIN HCL 1000 MG PO TABS: 500.0000 mg | ORAL_TABLET | Freq: Two times a day (BID) | ORAL | Status: DC

## 2015-11-20 MED ORDER — MIDAZOLAM HCL 2 MG/2ML IJ SOLN
INTRAMUSCULAR | Status: DC | PRN
  Administered 2015-11-20: 1 mg via INTRAVENOUS

## 2015-11-20 MED ORDER — MIRTAZAPINE 7.5 MG PO TABS
15.0000 mg | ORAL_TABLET | Freq: Every day | ORAL | Status: DC
  Administered 2015-11-20: 21:00:00 15 mg via ORAL
  Filled 2015-11-20: qty 2

## 2015-11-20 MED ORDER — ALBUTEROL SULFATE (2.5 MG/3ML) 0.083% IN NEBU: 2.5000 mg | INHALATION_SOLUTION | RESPIRATORY_TRACT | Status: DC | PRN

## 2015-11-20 MED ORDER — WARFARIN SODIUM 5 MG PO TABS
5.0000 mg | ORAL_TABLET | Freq: Every day | ORAL | Status: DC
  Administered 2015-11-20: 5 mg via ORAL
  Filled 2015-11-20 (×2): qty 1

## 2015-11-20 MED ORDER — NITROGLYCERIN 1 MG/10 ML FOR IR/CATH LAB
INTRA_ARTERIAL | Status: AC
  Filled 2015-11-20: qty 10

## 2015-11-20 MED ORDER — AMOXICILLIN-POT CLAVULANATE 875-125 MG PO TABS
1.0000 | ORAL_TABLET | Freq: Two times a day (BID) | ORAL | Status: DC
  Administered 2015-11-20 – 2015-11-21 (×2): 1 via ORAL
  Filled 2015-11-20 (×2): qty 1

## 2015-11-20 MED ORDER — SODIUM CHLORIDE 0.9 % IV SOLN
INTRAVENOUS | Status: DC | PRN
  Administered 2015-11-20: 150 mL/h via INTRAVENOUS

## 2015-11-20 MED ORDER — ONDANSETRON HCL 4 MG/2ML IJ SOLN: 4.0000 mg | Freq: Four times a day (QID) | INTRAMUSCULAR | Status: DC | PRN

## 2015-11-20 MED ORDER — HEPARIN (PORCINE) IN NACL 2-0.9 UNIT/ML-% IJ SOLN
INTRAMUSCULAR | Status: DC | PRN
  Administered 2015-11-20: 1000 mL

## 2015-11-20 MED ORDER — VITAMIN B-12 1000 MCG PO TABS
1000.0000 ug | ORAL_TABLET | Freq: Every day | ORAL | Status: DC
  Administered 2015-11-21: 1000 ug via ORAL
  Filled 2015-11-20: qty 1

## 2015-11-20 MED ORDER — ANGIOPLASTY BOOK
Freq: Once | Status: AC
  Administered 2015-11-20: 21:00:00
  Filled 2015-11-20: qty 1

## 2015-11-20 MED ORDER — FENTANYL CITRATE (PF) 100 MCG/2ML IJ SOLN: 25.0000 ug | INTRAMUSCULAR | Status: DC | PRN

## 2015-11-20 MED ORDER — SODIUM CHLORIDE 0.9% FLUSH: 3.0000 mL | Freq: Two times a day (BID) | INTRAVENOUS | Status: DC

## 2015-11-20 MED ORDER — ACETAMINOPHEN 325 MG PO TABS
650.0000 mg | ORAL_TABLET | ORAL | Status: DC | PRN
  Administered 2015-11-20: 650 mg via ORAL
  Filled 2015-11-20 (×2): qty 2

## 2015-11-20 MED ORDER — VERAPAMIL HCL 2.5 MG/ML IV SOLN
INTRAVENOUS | Status: AC
  Filled 2015-11-20: qty 2

## 2015-11-20 MED ORDER — COLCHICINE 0.6 MG PO TABS
0.6000 mg | ORAL_TABLET | Freq: Two times a day (BID) | ORAL | Status: DC
  Administered 2015-11-20 – 2015-11-21 (×2): 0.6 mg via ORAL
  Filled 2015-11-20 (×2): qty 1

## 2015-11-20 MED ORDER — IODIXANOL 320 MG/ML IV SOLN
INTRAVENOUS | Status: DC | PRN
  Administered 2015-11-20: 140 mL via INTRAVENOUS

## 2015-11-20 MED ORDER — SODIUM CHLORIDE 0.9 % IV SOLN: 1.0000 mL/kg/h | INTRAVENOUS | Status: DC

## 2015-11-20 MED ORDER — ACETAMINOPHEN 325 MG PO TABS
ORAL_TABLET | ORAL | Status: AC
  Administered 2015-11-20: 650 mg via ORAL
  Filled 2015-11-20: qty 2

## 2015-11-20 MED ORDER — ACETAMINOPHEN 325 MG PO TABS
650.0000 mg | ORAL_TABLET | ORAL | Status: DC | PRN
  Filled 2015-11-20: qty 2

## 2015-11-20 MED ORDER — FENTANYL CITRATE (PF) 100 MCG/2ML IJ SOLN
INTRAMUSCULAR | Status: DC | PRN
  Administered 2015-11-20: 25 ug via INTRAVENOUS

## 2015-11-20 MED ORDER — NITROGLYCERIN 1 MG/10 ML FOR IR/CATH LAB
INTRA_ARTERIAL | Status: DC | PRN
  Administered 2015-11-20 (×2): 700 ug
  Administered 2015-11-20: 1000 ug
  Administered 2015-11-20: 800 ug

## 2015-11-20 MED ORDER — ALLOPURINOL 100 MG PO TABS
100.0000 mg | ORAL_TABLET | Freq: Two times a day (BID) | ORAL | Status: DC
  Administered 2015-11-20 – 2015-11-21 (×2): 100 mg via ORAL
  Filled 2015-11-20 (×2): qty 1

## 2015-11-20 MED ORDER — TIOTROPIUM BROMIDE MONOHYDRATE 18 MCG IN CAPS
18.0000 ug | ORAL_CAPSULE | Freq: Every day | RESPIRATORY_TRACT | Status: DC
  Administered 2015-11-21: 09:00:00 18 ug via RESPIRATORY_TRACT
  Filled 2015-11-20: qty 5

## 2015-11-20 MED ORDER — SIMVASTATIN 20 MG PO TABS
40.0000 mg | ORAL_TABLET | Freq: Every evening | ORAL | Status: DC
  Administered 2015-11-20: 21:00:00 40 mg via ORAL
  Filled 2015-11-20: qty 2

## 2015-11-20 MED ORDER — WARFARIN - PHYSICIAN DOSING INPATIENT: Freq: Every day | Status: DC

## 2015-11-20 SURGICAL SUPPLY — 20 items
BALLN ARMADA 3.0X200X150 (BALLOONS) ×3
BALLOON ARMADA 3.0X200X150 (BALLOONS) IMPLANT
CATH CXI SUPP ST 2.6FR 150CM (MICROCATHETER) ×1 IMPLANT
CATH OMNI FLUSH 5F 65CM (CATHETERS) ×1 IMPLANT
CROWN STEALTH MICRO-30 1.25MM (CATHETERS) ×1 IMPLANT
GUIDEWIRE ASTATO XS 20G 300CM (WIRE) ×2 IMPLANT
KIT ENCORE 26 ADVANTAGE (KITS) ×1 IMPLANT
KIT MICROINTRODUCER STIFF 5F (SHEATH) ×1 IMPLANT
KIT PV (KITS) ×3 IMPLANT
SHEATH HIGHFLEX ANSEL 7FR 55CM (SHEATH) ×1 IMPLANT
SHEATH PINNACLE 5F 10CM (SHEATH) ×1 IMPLANT
SHEATH PINNACLE 8F 10CM (SHEATH) ×1 IMPLANT
STOPCOCK MORSE 400PSI 3WAY (MISCELLANEOUS) ×1 IMPLANT
SYRINGE MEDRAD AVANTA MACH 7 (SYRINGE) ×1 IMPLANT
TAPE RADIOPAQUE TURBO (MISCELLANEOUS) ×4 IMPLANT
TRANSDUCER W/STOPCOCK (MISCELLANEOUS) ×3 IMPLANT
TRAY PV CATH (CUSTOM PROCEDURE TRAY) ×3 IMPLANT
TUBING CIL FLEX 10 FLL-RA (TUBING) ×1 IMPLANT
WIRE HITORQ VERSACORE ST 145CM (WIRE) ×1 IMPLANT
WIRE VIPER ADVANCE .017X335CM (WIRE) ×1 IMPLANT

## 2015-11-20 NOTE — Progress Notes (Addendum)
Femstop placed to Rt Femoral Artery Site due to hematoma. Femstop placed at 24mmHg. Site marked. Patient BP 101/66 Pulse 86. Patient states he is having minimal back pain but states it is getting much better. Rt DP pulse 1+. Dr. Einar Gip aware. Jodette,RN in room with patient at this time. Care transferred back to [facility] at this time.

## 2015-11-20 NOTE — Progress Notes (Signed)
Patient seen and evaluated. Right groin without hematoma and good hemostasis. Do not think retroperitoneal hematoma as the stick was low. BP is stable. Continue to monitor. Resume coumadin. Check PT/INR tomorrow

## 2015-11-20 NOTE — Interval H&P Note (Signed)
History and Physical Interval Note:  11/20/2015 6:24 AM  [name]  has presented today for surgery, with the diagnosis of Lower Left Limb Ischemia  The various methods of treatment have been discussed with the patient and family. After consideration of risks, benefits and other options for treatment, the patient has consented to  Procedure(s): Lower Extremity Angiography (N/A) and possible PTA as a surgical intervention .  The patient's history has been reviewed, patient examined, no change in status, stable for surgery.  I have reviewed the patient's chart and labs.  Questions were answered to the patient's satisfaction.     Adrian Prows

## 2015-11-20 NOTE — Progress Notes (Signed)
Pt stable/ agitated with femstop pressure reduced to 22. Dr Einar Gip paged regarding bed placement.

## 2015-11-20 NOTE — Progress Notes (Addendum)
Site area: RFA Site Prior to Removal:  Level 1 Pressure Applied For:32 min Manual:  yes  Patient Status During Pull:stable   Post Pull Site:  Level 1 Post Pull Instructions Given:  yes Post Pull Pulses Present: doppler Dressing Applied:  clear Bedrest begins @ 1300 Comments:pulled with act 193 /growing hematoma/ Dr Einar Gip is aware per Suella Broad RN Site with very faint bruising minimally raised area below. 1320-Dr Ganji consulted.  Recommends  Observation period prior to Short Stay - 8h bed rest - DC home

## 2015-11-20 NOTE — Progress Notes (Signed)
On arrival pt  had a 2+hematoma, Jan rt from cath lab holding pressure, Dr Einar Gip was paged, orders followed to admit. Dr Einar Gip wants a fem stop placed at 30 mm/hg for 6 hours

## 2015-11-20 NOTE — Progress Notes (Signed)
Femstop decreased to 62mmHg. Rt DP 1+. No changes to Rt groin site from previous assessment.

## 2015-11-21 ENCOUNTER — Encounter (HOSPITAL_COMMUNITY): Payer: Self-pay | Admitting: Cardiology

## 2015-11-21 DIAGNOSIS — E11621 Type 2 diabetes mellitus with foot ulcer: Secondary | ICD-10-CM | POA: Diagnosis not present

## 2015-11-21 DIAGNOSIS — I70245 Atherosclerosis of native arteries of left leg with ulceration of other part of foot: Secondary | ICD-10-CM | POA: Diagnosis not present

## 2015-11-21 DIAGNOSIS — E7800 Pure hypercholesterolemia, unspecified: Secondary | ICD-10-CM | POA: Diagnosis not present

## 2015-11-21 DIAGNOSIS — E1151 Type 2 diabetes mellitus with diabetic peripheral angiopathy without gangrene: Secondary | ICD-10-CM | POA: Diagnosis not present

## 2015-11-21 DIAGNOSIS — I482 Chronic atrial fibrillation: Secondary | ICD-10-CM | POA: Diagnosis not present

## 2015-11-21 DIAGNOSIS — Z993 Dependence on wheelchair: Secondary | ICD-10-CM | POA: Diagnosis not present

## 2015-11-21 DIAGNOSIS — Z8673 Personal history of transient ischemic attack (TIA), and cerebral infarction without residual deficits: Secondary | ICD-10-CM | POA: Diagnosis not present

## 2015-11-21 DIAGNOSIS — I739 Peripheral vascular disease, unspecified: Secondary | ICD-10-CM | POA: Diagnosis not present

## 2015-11-21 DIAGNOSIS — J439 Emphysema, unspecified: Secondary | ICD-10-CM | POA: Diagnosis not present

## 2015-11-21 DIAGNOSIS — Z7901 Long term (current) use of anticoagulants: Secondary | ICD-10-CM | POA: Diagnosis not present

## 2015-11-21 DIAGNOSIS — E1143 Type 2 diabetes mellitus with diabetic autonomic (poly)neuropathy: Secondary | ICD-10-CM | POA: Diagnosis not present

## 2015-11-21 DIAGNOSIS — L97529 Non-pressure chronic ulcer of other part of left foot with unspecified severity: Secondary | ICD-10-CM | POA: Diagnosis not present

## 2015-11-21 DIAGNOSIS — I10 Essential (primary) hypertension: Secondary | ICD-10-CM | POA: Diagnosis not present

## 2015-11-21 LAB — GLUCOSE, CAPILLARY: Glucose-Capillary: 114 mg/dL — ABNORMAL HIGH (ref 65–99)

## 2015-11-21 LAB — CBC
HEMATOCRIT: 29.1 % — AB (ref 39.0–52.0)
Hemoglobin: 9.6 g/dL — ABNORMAL LOW (ref 13.0–17.0)
MCH: 32.5 pg (ref 26.0–34.0)
MCHC: 33 g/dL (ref 30.0–36.0)
MCV: 98.6 fL (ref 78.0–100.0)
Platelets: 137 10*3/uL — ABNORMAL LOW (ref 150–400)
RBC: 2.95 MIL/uL — AB (ref 4.22–5.81)
RDW: 14.1 % (ref 11.5–15.5)
WBC: 4.8 10*3/uL (ref 4.0–10.5)

## 2015-11-21 LAB — BASIC METABOLIC PANEL
ANION GAP: 7 (ref 5–15)
BUN: 22 mg/dL — ABNORMAL HIGH (ref 6–20)
CO2: 23 mmol/L (ref 22–32)
Calcium: 8.1 mg/dL — ABNORMAL LOW (ref 8.9–10.3)
Chloride: 111 mmol/L (ref 101–111)
Creatinine, Ser: 0.85 mg/dL (ref 0.61–1.24)
GLUCOSE: 116 mg/dL — AB (ref 65–99)
POTASSIUM: 4.4 mmol/L (ref 3.5–5.1)
Sodium: 141 mmol/L (ref 135–145)

## 2015-11-21 MED ORDER — ACETAMINOPHEN 325 MG PO TABS: 650.0000 mg | ORAL_TABLET | ORAL | Status: AC | PRN

## 2015-11-21 NOTE — Discharge Summary (Addendum)
Physician Discharge Summary  Patient ID: [name] MRN: [id number] DOB/AGE: 80-Jun-1929 80 y.o.  Admit date: 11/20/2015 Discharge date: 11/21/2015  Primary Discharge Diagnosis: PAD s/p PTA and atherectomy with a 1.25 mm solid crown CSI diamondback atherectomy of the left tibioperoneal trunk, left CTO posterior tibial artery, balloon angioplasty with a 3.0 x 200 mm [place] to the posterior tibial artery and also tibioperoneal trunk and distal popliteal artery  Secondary Discharge Diagnosis: 1. Hyperlipidemia 2. Type 2 Diabetes Mellitus 3. Chronic a.fib 4. Benign essential hypertension  Significant Diagnostic Studies: Lower extremity angiogram 11/20/2015: Abdominal aortogram reveals diffuse atherosclerotic changes of the abdominal aorta. 2 renal arteries one on either side widely patent. I do iliac bifurcation is widely patent with diffuse calcific disease.  There is diffuse calcific disease of the left common femoral and superficial femoral artery. No high-grade stenosis is evident up to the level of the distal popliteal artery. There is a high-grade 80-90% stenosis of the tibioperoneal trunk at its origin, anterior tibial is occluded at the proximal segment, the peroneal artery is the single-vessel that runs to the ankle and is occluded at the level of the ankle. The posterior tibial artery is chronically occluded in the midsegment, diffuse disease in the proximal to midsegment, posterior to will artery reconstitutes the pedal arch.  Hospital Course: He has a history of CVA on 07/06/2013, confirmed by MRI, after presenting with gait ataxia and left upper extremity weakness, which persists, chronic atrial fibrillation on chronic warfarin therapy, hypertension, diabetes, hyperlipidemia, and history of tobacco use with a significant history of PAD. On 11/06/2012, he underwent PTA and stenting of the right SFA. He was last seen in the office on 06/03/2013.  He underwent amputation of his left  great toe on 09/12/2015 due to cellulitis. He saw Dr. Sharol Given on 11/15/2015 for reevaluation due to ischemic changes of 2nd digit of left foot. His caregiver with him reported that his toenail came off the previous week and he developed an ulcer to the top of the toe around the same time. He was given antibiotic and nitroglycerin patches to place on his foot to attempt to increase perfusion and was referred to our office on a STAT basis for evaluation of possible revascularization options. He denied any pain in either leg. Although he can ambulate short distances, he is primarily wheelchair-bound.    On 11/20/2015, he underwent successful PTA and atherectomy with a 1.25 mm solid crown CSI diamondback atherectomy of the left tibioperoneal trunk, left CTO posterior tibial artery, balloon angioplasty with a 3.0 x 200 mm [place] to the posterior tibial artery and also tibioperoneal trunk and distal popliteal artery. 100% stenosis reduced to 0%. Following the procedure, brisk filling at the level of the ankle was evident with reconstitution of the anterior tibial vessel and retrograde filling was now noted.  Recommendations on discharge: Suspect that the wound will heal. Patient's foot already warm and pain has improved significantly.  If the wound does not heal, repeat attempt at revascularizing the chronically occluded anterior tibial vessel can be attempted.  Discharge Exam: Blood pressure 111/68, pulse 90, temperature 98.6 F (37 C), temperature source Oral, resp. rate 17, height 5\' 11"  (1.803 m), weight 58.9 kg (129 lb 13.6 oz), SpO2 90 %.    General appearance: alert, cooperative, cachectic and no distress Neck: no adenopathy, no carotid bruit, no JVD, supple, symmetrical, trachea midline and thyroid not enlarged, symmetric, no tenderness/mass/nodules Resp: clear to auscultation bilaterally Cardio: irregularly irregular rhythm, S1: variable, S2: normal,  no S3 or S4 and no murmurs Extremities: shiny,  atrophic skin, venous stasis ulcerations, left second toe ulcerated but improved in appearance, errythema resolved Pulses: Right Pulses: FEM: present 1+, POP: absent, DP: barely palpable, PT: absent Left Pulses: FEM: absent, POP: absent, DP: doppler, PT: doppler Skin: pink, warm, and dry; abnormalities noted above Incision/Wound: right femoral access site ecchymosis, soft, no hematoma, no bruit, minimal tenderness  Labs:   Lab Results  Component Value Date   WBC 4.8 11/21/2015   HGB 9.6* 11/21/2015   HCT 29.1* 11/21/2015   MCV 98.6 11/21/2015   PLT 137* 11/21/2015    Recent Labs Lab 11/21/15 0417  NA 141  K 4.4  CL 111  CO2 23  BUN 22*  CREATININE 0.85  CALCIUM 8.1*  GLUCOSE 116*    Lipid Panel     Component Value Date/Time   CHOL 102 07/07/2013 0500   TRIG 79 07/07/2013 0500   HDL 33* 07/07/2013 0500   CHOLHDL 3.1 07/07/2013 0500   VLDL 16 07/07/2013 0500   LDLCALC 53 07/07/2013 0500    BNP (last 3 results) No results for input(s): BNP in the last 8760 hours.  HEMOGLOBIN A1C Lab Results  Component Value Date   HGBA1C 5.8* 09/13/2015   MPG 120 09/13/2015    Cardiac Panel (last 3 results)  Recent Labs  09/11/15 1727  TROPONINI <0.03    Lab Results  Component Value Date   TROPONINI <0.03 09/11/2015     TSH  Recent Labs  09/13/15 0630  TSH 1.847    Radiology: Dg Chest 2 View  11/12/2015  CLINICAL DATA:  Swelling for 2 months EXAM: CHEST  2 VIEW COMPARISON:  09/11/2015 FINDINGS: Calcified granuloma at the right base. Mild cardiomegaly. Bibasilar atelectasis. Right pleural changes are chronic. IMPRESSION: No active cardiopulmonary disease. Electronically Signed   By: Marybelle Killings M.D.   On: 11/12/2015 12:47   Dg Toe 2nd Left  11/12/2015  CLINICAL DATA:  Pt c/o L second toe and foot pain and swelling x 2 months. Pt had his great toe removed in January 2017. Pt also c/o SOB and has a productive cough. Hx of HTN, DM, COPD, CAD, and PNA. Pt is a  former smoker. EXAM: LEFT SECOND TOE COMPARISON:  09/04/2015 FINDINGS: Since the prior study, the great toes been resected. There is soft tissue swelling surrounding the second toe. There is no bone resorption to suggest osteomyelitis. There is no fracture. There is a small chronic marginal erosion along the lateral margin of the fifth metatarsal head, stable from the prior exam. Joints are normally aligned. Bones are demineralized. There are vascular calcifications noted along the digital arteries. IMPRESSION: 1. Left second toe soft tissue swelling, but no fracture or evidence of osteomyelitis. Electronically Signed   By: Lajean Manes M.D.   On: 11/12/2015 12:48      FOLLOW UP PLANS AND APPOINTMENTS    Medication List    TAKE these medications        acetaminophen 325 MG tablet  Commonly known as:  TYLENOL  Take 2 tablets (650 mg total) by mouth every 4 (four) hours as needed for mild pain.     albuterol (2.5 MG/3ML) 0.083% nebulizer solution  Commonly known as:  PROVENTIL  Take 2.5 mg by nebulization 4 (four) times daily.     allopurinol 100 MG tablet  Commonly known as:  ZYLOPRIM  Take 100 mg by mouth 2 (two) times daily.     amoxicillin-clavulanate 875-125 MG  tablet  Commonly known as:  AUGMENTIN  Take 1 tablet by mouth 2 (two) times daily. One po bid x 7 days     budesonide 0.5 MG/2ML nebulizer solution  Commonly known as:  PULMICORT  Take 0.5 mg by nebulization 2 (two) times daily as needed (for wheezing/shortness of breath).     carvedilol 6.25 MG tablet  Commonly known as:  COREG  Take 6.25 mg by mouth 2 (two) times daily with a meal.     cilostazol 50 MG tablet  Commonly known as:  PLETAL  Take 50 mg by mouth 2 (two) times daily.     colchicine 0.6 MG tablet  Take 1 tablet (0.6 mg total) by mouth 2 (two) times daily.     magnesium oxide 400 MG tablet  Commonly known as:  MAG-OX  Take 200 mg by mouth daily.     Melatonin 3 MG Tabs  Take 3 mg by mouth at  bedtime as needed (sleep).     metFORMIN 1000 MG tablet  Commonly known as:  GLUCOPHAGE  Take 0.5 tablets (500 mg total) by mouth 2 (two) times daily with a meal.  Start taking on:  11/22/2015     mirtazapine 15 MG tablet  Commonly known as:  REMERON  Take 15 mg by mouth at bedtime.     OCUVITE PRESERVISION PO  Take 1 tablet by mouth daily.     pantoprazole 40 MG tablet  Commonly known as:  PROTONIX  Take 1 tablet (40 mg total) by mouth 2 (two) times daily.     ranitidine 300 MG tablet  Commonly known as:  ZANTAC  Take 300 mg by mouth at bedtime.     simvastatin 40 MG tablet  Commonly known as:  ZOCOR  Take 40 mg by mouth every evening.     tiotropium 18 MCG inhalation capsule  Commonly known as:  SPIRIVA  Place 18 mcg into inhaler and inhale daily.     vitamin B-12 1000 MCG tablet  Commonly known as:  CYANOCOBALAMIN  Take 1,000 mcg by mouth daily.     warfarin 5 MG tablet  Commonly known as:  COUMADIN  Take 1 tablet by mouth daily.       Follow-up Information    Follow up with Adrian Prows, MD On 11/30/2015.   Specialty:  Cardiology   Why:  at 11:45am   Contact information:   [place] [place] [address] [phone]        Rachel Bo, NP-C 11/21/2015, 8:34 AM [facility] Pager: [phone] Office: [phone]  I have personally reviewed the patient's record and performed physical exam and agree with the assessment and plan of Ms. Neldon Labella, NP-C.  Adrian Prows, MD 11/21/2015, 9:59 AM [facility] [place] Pager: [phone] Office: [phone] If no answer: Cell:  [phone]

## 2015-11-21 NOTE — Discharge Instructions (Signed)
Hold Metformin today and tomorrow.

## 2015-11-22 NOTE — Care Management Note (Signed)
Case Management Note  Patient Details  Name: [name] MRN: [id number] Date of Birth: 1928-07-05  Subjective/Objective:    Patient from home, pta indep, No other needs.                Action/Plan:   Expected Discharge Date:                  Expected Discharge Plan:  Home/Self Care  In-House Referral:     Discharge planning Services  CM Consult  Post Acute Care Choice:    Choice offered to:     DME Arranged:    DME Agency:     HH Arranged:    [place] Agency:     Status of Service:  Completed, signed off  Medicare Important Message Given:    Date Medicare IM Given:    Medicare IM give by:    Date Additional Medicare IM Given:    Additional Medicare Important Message give by:     If discussed at [place] of Stay Meetings, dates discussed:    Additional Comments:  Zenon Mayo, RN 11/22/2015, 12:43 PM

## 2015-11-30 DIAGNOSIS — I482 Chronic atrial fibrillation: Secondary | ICD-10-CM | POA: Diagnosis not present

## 2015-11-30 DIAGNOSIS — I998 Other disorder of circulatory system: Secondary | ICD-10-CM | POA: Diagnosis not present

## 2015-11-30 DIAGNOSIS — I739 Peripheral vascular disease, unspecified: Secondary | ICD-10-CM | POA: Diagnosis not present

## 2015-11-30 DIAGNOSIS — Z7901 Long term (current) use of anticoagulants: Secondary | ICD-10-CM | POA: Diagnosis not present

## 2015-12-05 DIAGNOSIS — Z7901 Long term (current) use of anticoagulants: Secondary | ICD-10-CM | POA: Diagnosis not present

## 2015-12-05 DIAGNOSIS — I4891 Unspecified atrial fibrillation: Secondary | ICD-10-CM | POA: Diagnosis not present

## 2015-12-08 DIAGNOSIS — J90 Pleural effusion, not elsewhere classified: Secondary | ICD-10-CM | POA: Diagnosis not present

## 2015-12-08 DIAGNOSIS — J449 Chronic obstructive pulmonary disease, unspecified: Secondary | ICD-10-CM | POA: Diagnosis not present

## 2015-12-12 DIAGNOSIS — Z7901 Long term (current) use of anticoagulants: Secondary | ICD-10-CM | POA: Diagnosis not present

## 2015-12-12 DIAGNOSIS — I4891 Unspecified atrial fibrillation: Secondary | ICD-10-CM | POA: Diagnosis not present

## 2015-12-15 DIAGNOSIS — J449 Chronic obstructive pulmonary disease, unspecified: Secondary | ICD-10-CM | POA: Diagnosis not present

## 2015-12-15 DIAGNOSIS — J189 Pneumonia, unspecified organism: Secondary | ICD-10-CM | POA: Diagnosis not present

## 2015-12-19 DIAGNOSIS — I739 Peripheral vascular disease, unspecified: Secondary | ICD-10-CM | POA: Diagnosis not present

## 2015-12-21 DIAGNOSIS — E119 Type 2 diabetes mellitus without complications: Secondary | ICD-10-CM | POA: Diagnosis not present

## 2015-12-21 DIAGNOSIS — I480 Paroxysmal atrial fibrillation: Secondary | ICD-10-CM | POA: Diagnosis not present

## 2015-12-21 DIAGNOSIS — I10 Essential (primary) hypertension: Secondary | ICD-10-CM | POA: Diagnosis not present

## 2015-12-21 DIAGNOSIS — R05 Cough: Secondary | ICD-10-CM | POA: Diagnosis not present

## 2016-01-04 DIAGNOSIS — J189 Pneumonia, unspecified organism: Secondary | ICD-10-CM | POA: Diagnosis not present

## 2016-01-04 DIAGNOSIS — I10 Essential (primary) hypertension: Secondary | ICD-10-CM | POA: Diagnosis not present

## 2016-01-04 DIAGNOSIS — E118 Type 2 diabetes mellitus with unspecified complications: Secondary | ICD-10-CM | POA: Diagnosis not present

## 2016-01-08 DIAGNOSIS — R109 Unspecified abdominal pain: Secondary | ICD-10-CM | POA: Diagnosis not present

## 2016-01-08 DIAGNOSIS — E119 Type 2 diabetes mellitus without complications: Secondary | ICD-10-CM | POA: Diagnosis not present

## 2016-01-08 DIAGNOSIS — J90 Pleural effusion, not elsewhere classified: Secondary | ICD-10-CM | POA: Diagnosis not present

## 2016-01-08 DIAGNOSIS — I10 Essential (primary) hypertension: Secondary | ICD-10-CM | POA: Diagnosis not present

## 2016-01-08 DIAGNOSIS — I480 Paroxysmal atrial fibrillation: Secondary | ICD-10-CM | POA: Diagnosis not present

## 2016-01-08 DIAGNOSIS — I739 Peripheral vascular disease, unspecified: Secondary | ICD-10-CM | POA: Diagnosis not present

## 2016-01-10 DIAGNOSIS — Z7901 Long term (current) use of anticoagulants: Secondary | ICD-10-CM | POA: Diagnosis not present

## 2016-01-10 DIAGNOSIS — I482 Chronic atrial fibrillation: Secondary | ICD-10-CM | POA: Diagnosis not present

## 2016-01-10 DIAGNOSIS — I739 Peripheral vascular disease, unspecified: Secondary | ICD-10-CM | POA: Diagnosis not present

## 2016-01-10 DIAGNOSIS — I998 Other disorder of circulatory system: Secondary | ICD-10-CM | POA: Diagnosis not present

## 2016-02-15 DIAGNOSIS — J449 Chronic obstructive pulmonary disease, unspecified: Secondary | ICD-10-CM | POA: Diagnosis not present

## 2016-02-15 DIAGNOSIS — R0602 Shortness of breath: Secondary | ICD-10-CM | POA: Diagnosis not present

## 2016-02-15 DIAGNOSIS — R05 Cough: Secondary | ICD-10-CM | POA: Diagnosis not present

## 2016-02-16 DIAGNOSIS — J449 Chronic obstructive pulmonary disease, unspecified: Secondary | ICD-10-CM | POA: Diagnosis not present

## 2016-02-16 DIAGNOSIS — Z7984 Long term (current) use of oral hypoglycemic drugs: Secondary | ICD-10-CM | POA: Diagnosis not present

## 2016-02-16 DIAGNOSIS — Z7901 Long term (current) use of anticoagulants: Secondary | ICD-10-CM | POA: Diagnosis not present

## 2016-02-16 DIAGNOSIS — Z9981 Dependence on supplemental oxygen: Secondary | ICD-10-CM | POA: Diagnosis not present

## 2016-02-16 DIAGNOSIS — E1140 Type 2 diabetes mellitus with diabetic neuropathy, unspecified: Secondary | ICD-10-CM | POA: Diagnosis not present

## 2016-02-16 DIAGNOSIS — I4891 Unspecified atrial fibrillation: Secondary | ICD-10-CM | POA: Diagnosis not present

## 2016-02-16 DIAGNOSIS — Z7952 Long term (current) use of systemic steroids: Secondary | ICD-10-CM | POA: Diagnosis not present

## 2016-02-16 DIAGNOSIS — Z792 Long term (current) use of antibiotics: Secondary | ICD-10-CM | POA: Diagnosis not present

## 2016-02-16 DIAGNOSIS — R479 Unspecified speech disturbances: Secondary | ICD-10-CM | POA: Diagnosis not present

## 2016-02-16 DIAGNOSIS — R29898 Other symptoms and signs involving the musculoskeletal system: Secondary | ICD-10-CM | POA: Diagnosis not present

## 2016-02-16 DIAGNOSIS — I10 Essential (primary) hypertension: Secondary | ICD-10-CM | POA: Diagnosis not present

## 2016-02-16 DIAGNOSIS — Z8673 Personal history of transient ischemic attack (TIA), and cerebral infarction without residual deficits: Secondary | ICD-10-CM | POA: Diagnosis not present

## 2016-02-22 DIAGNOSIS — R05 Cough: Secondary | ICD-10-CM | POA: Diagnosis not present

## 2016-02-22 DIAGNOSIS — J449 Chronic obstructive pulmonary disease, unspecified: Secondary | ICD-10-CM | POA: Diagnosis not present

## 2016-02-24 DIAGNOSIS — J449 Chronic obstructive pulmonary disease, unspecified: Secondary | ICD-10-CM | POA: Diagnosis not present

## 2016-02-24 DIAGNOSIS — Z9981 Dependence on supplemental oxygen: Secondary | ICD-10-CM | POA: Diagnosis not present

## 2016-02-24 DIAGNOSIS — R479 Unspecified speech disturbances: Secondary | ICD-10-CM | POA: Diagnosis not present

## 2016-02-24 DIAGNOSIS — Z8673 Personal history of transient ischemic attack (TIA), and cerebral infarction without residual deficits: Secondary | ICD-10-CM | POA: Diagnosis not present

## 2016-02-24 DIAGNOSIS — Z792 Long term (current) use of antibiotics: Secondary | ICD-10-CM | POA: Diagnosis not present

## 2016-02-24 DIAGNOSIS — Z7984 Long term (current) use of oral hypoglycemic drugs: Secondary | ICD-10-CM | POA: Diagnosis not present

## 2016-02-24 DIAGNOSIS — Z7901 Long term (current) use of anticoagulants: Secondary | ICD-10-CM | POA: Diagnosis not present

## 2016-02-24 DIAGNOSIS — R29898 Other symptoms and signs involving the musculoskeletal system: Secondary | ICD-10-CM | POA: Diagnosis not present

## 2016-02-24 DIAGNOSIS — E1140 Type 2 diabetes mellitus with diabetic neuropathy, unspecified: Secondary | ICD-10-CM | POA: Diagnosis not present

## 2016-02-24 DIAGNOSIS — I4891 Unspecified atrial fibrillation: Secondary | ICD-10-CM | POA: Diagnosis not present

## 2016-02-24 DIAGNOSIS — I10 Essential (primary) hypertension: Secondary | ICD-10-CM | POA: Diagnosis not present

## 2016-02-24 DIAGNOSIS — Z7952 Long term (current) use of systemic steroids: Secondary | ICD-10-CM | POA: Diagnosis not present

## 2016-02-25 DIAGNOSIS — R079 Chest pain, unspecified: Secondary | ICD-10-CM | POA: Diagnosis not present

## 2016-02-27 DIAGNOSIS — Z792 Long term (current) use of antibiotics: Secondary | ICD-10-CM | POA: Diagnosis not present

## 2016-02-27 DIAGNOSIS — I4891 Unspecified atrial fibrillation: Secondary | ICD-10-CM | POA: Diagnosis not present

## 2016-02-27 DIAGNOSIS — R29898 Other symptoms and signs involving the musculoskeletal system: Secondary | ICD-10-CM | POA: Diagnosis not present

## 2016-02-27 DIAGNOSIS — Z7901 Long term (current) use of anticoagulants: Secondary | ICD-10-CM | POA: Diagnosis not present

## 2016-02-27 DIAGNOSIS — Z8673 Personal history of transient ischemic attack (TIA), and cerebral infarction without residual deficits: Secondary | ICD-10-CM | POA: Diagnosis not present

## 2016-02-27 DIAGNOSIS — I10 Essential (primary) hypertension: Secondary | ICD-10-CM | POA: Diagnosis not present

## 2016-02-27 DIAGNOSIS — J449 Chronic obstructive pulmonary disease, unspecified: Secondary | ICD-10-CM | POA: Diagnosis not present

## 2016-02-27 DIAGNOSIS — E1140 Type 2 diabetes mellitus with diabetic neuropathy, unspecified: Secondary | ICD-10-CM | POA: Diagnosis not present

## 2016-02-27 DIAGNOSIS — Z9981 Dependence on supplemental oxygen: Secondary | ICD-10-CM | POA: Diagnosis not present

## 2016-02-27 DIAGNOSIS — R479 Unspecified speech disturbances: Secondary | ICD-10-CM | POA: Diagnosis not present

## 2016-02-27 DIAGNOSIS — Z7952 Long term (current) use of systemic steroids: Secondary | ICD-10-CM | POA: Diagnosis not present

## 2016-02-27 DIAGNOSIS — Z7984 Long term (current) use of oral hypoglycemic drugs: Secondary | ICD-10-CM | POA: Diagnosis not present

## 2016-03-01 DIAGNOSIS — Z8673 Personal history of transient ischemic attack (TIA), and cerebral infarction without residual deficits: Secondary | ICD-10-CM | POA: Diagnosis not present

## 2016-03-01 DIAGNOSIS — J449 Chronic obstructive pulmonary disease, unspecified: Secondary | ICD-10-CM | POA: Diagnosis not present

## 2016-03-01 DIAGNOSIS — E1140 Type 2 diabetes mellitus with diabetic neuropathy, unspecified: Secondary | ICD-10-CM | POA: Diagnosis not present

## 2016-03-01 DIAGNOSIS — I10 Essential (primary) hypertension: Secondary | ICD-10-CM | POA: Diagnosis not present

## 2016-03-01 DIAGNOSIS — R29898 Other symptoms and signs involving the musculoskeletal system: Secondary | ICD-10-CM | POA: Diagnosis not present

## 2016-03-01 DIAGNOSIS — Z9981 Dependence on supplemental oxygen: Secondary | ICD-10-CM | POA: Diagnosis not present

## 2016-03-01 DIAGNOSIS — Z792 Long term (current) use of antibiotics: Secondary | ICD-10-CM | POA: Diagnosis not present

## 2016-03-01 DIAGNOSIS — Z7952 Long term (current) use of systemic steroids: Secondary | ICD-10-CM | POA: Diagnosis not present

## 2016-03-01 DIAGNOSIS — R479 Unspecified speech disturbances: Secondary | ICD-10-CM | POA: Diagnosis not present

## 2016-03-01 DIAGNOSIS — Z7984 Long term (current) use of oral hypoglycemic drugs: Secondary | ICD-10-CM | POA: Diagnosis not present

## 2016-03-01 DIAGNOSIS — I4891 Unspecified atrial fibrillation: Secondary | ICD-10-CM | POA: Diagnosis not present

## 2016-03-01 DIAGNOSIS — Z7901 Long term (current) use of anticoagulants: Secondary | ICD-10-CM | POA: Diagnosis not present

## 2016-03-05 DIAGNOSIS — R29898 Other symptoms and signs involving the musculoskeletal system: Secondary | ICD-10-CM | POA: Diagnosis not present

## 2016-03-05 DIAGNOSIS — R479 Unspecified speech disturbances: Secondary | ICD-10-CM | POA: Diagnosis not present

## 2016-03-05 DIAGNOSIS — J449 Chronic obstructive pulmonary disease, unspecified: Secondary | ICD-10-CM | POA: Diagnosis not present

## 2016-03-05 DIAGNOSIS — Z8673 Personal history of transient ischemic attack (TIA), and cerebral infarction without residual deficits: Secondary | ICD-10-CM | POA: Diagnosis not present

## 2016-03-05 DIAGNOSIS — Z792 Long term (current) use of antibiotics: Secondary | ICD-10-CM | POA: Diagnosis not present

## 2016-03-05 DIAGNOSIS — I10 Essential (primary) hypertension: Secondary | ICD-10-CM | POA: Diagnosis not present

## 2016-03-05 DIAGNOSIS — E1140 Type 2 diabetes mellitus with diabetic neuropathy, unspecified: Secondary | ICD-10-CM | POA: Diagnosis not present

## 2016-03-05 DIAGNOSIS — Z7952 Long term (current) use of systemic steroids: Secondary | ICD-10-CM | POA: Diagnosis not present

## 2016-03-05 DIAGNOSIS — Z9981 Dependence on supplemental oxygen: Secondary | ICD-10-CM | POA: Diagnosis not present

## 2016-03-05 DIAGNOSIS — Z7901 Long term (current) use of anticoagulants: Secondary | ICD-10-CM | POA: Diagnosis not present

## 2016-03-05 DIAGNOSIS — I4891 Unspecified atrial fibrillation: Secondary | ICD-10-CM | POA: Diagnosis not present

## 2016-03-05 DIAGNOSIS — Z7984 Long term (current) use of oral hypoglycemic drugs: Secondary | ICD-10-CM | POA: Diagnosis not present

## 2016-03-07 DIAGNOSIS — J449 Chronic obstructive pulmonary disease, unspecified: Secondary | ICD-10-CM | POA: Diagnosis not present

## 2016-03-07 DIAGNOSIS — Z7952 Long term (current) use of systemic steroids: Secondary | ICD-10-CM | POA: Diagnosis not present

## 2016-03-07 DIAGNOSIS — R29898 Other symptoms and signs involving the musculoskeletal system: Secondary | ICD-10-CM | POA: Diagnosis not present

## 2016-03-07 DIAGNOSIS — I10 Essential (primary) hypertension: Secondary | ICD-10-CM | POA: Diagnosis not present

## 2016-03-07 DIAGNOSIS — I4891 Unspecified atrial fibrillation: Secondary | ICD-10-CM | POA: Diagnosis not present

## 2016-03-07 DIAGNOSIS — R479 Unspecified speech disturbances: Secondary | ICD-10-CM | POA: Diagnosis not present

## 2016-03-07 DIAGNOSIS — Z7984 Long term (current) use of oral hypoglycemic drugs: Secondary | ICD-10-CM | POA: Diagnosis not present

## 2016-03-07 DIAGNOSIS — Z7901 Long term (current) use of anticoagulants: Secondary | ICD-10-CM | POA: Diagnosis not present

## 2016-03-07 DIAGNOSIS — Z8673 Personal history of transient ischemic attack (TIA), and cerebral infarction without residual deficits: Secondary | ICD-10-CM | POA: Diagnosis not present

## 2016-03-07 DIAGNOSIS — E1140 Type 2 diabetes mellitus with diabetic neuropathy, unspecified: Secondary | ICD-10-CM | POA: Diagnosis not present

## 2016-03-07 DIAGNOSIS — Z9981 Dependence on supplemental oxygen: Secondary | ICD-10-CM | POA: Diagnosis not present

## 2016-03-07 DIAGNOSIS — Z792 Long term (current) use of antibiotics: Secondary | ICD-10-CM | POA: Diagnosis not present

## 2016-03-12 DIAGNOSIS — Z792 Long term (current) use of antibiotics: Secondary | ICD-10-CM | POA: Diagnosis not present

## 2016-03-12 DIAGNOSIS — Z7984 Long term (current) use of oral hypoglycemic drugs: Secondary | ICD-10-CM | POA: Diagnosis not present

## 2016-03-12 DIAGNOSIS — E1140 Type 2 diabetes mellitus with diabetic neuropathy, unspecified: Secondary | ICD-10-CM | POA: Diagnosis not present

## 2016-03-12 DIAGNOSIS — Z7952 Long term (current) use of systemic steroids: Secondary | ICD-10-CM | POA: Diagnosis not present

## 2016-03-12 DIAGNOSIS — R479 Unspecified speech disturbances: Secondary | ICD-10-CM | POA: Diagnosis not present

## 2016-03-12 DIAGNOSIS — I10 Essential (primary) hypertension: Secondary | ICD-10-CM | POA: Diagnosis not present

## 2016-03-12 DIAGNOSIS — Z7901 Long term (current) use of anticoagulants: Secondary | ICD-10-CM | POA: Diagnosis not present

## 2016-03-12 DIAGNOSIS — I4891 Unspecified atrial fibrillation: Secondary | ICD-10-CM | POA: Diagnosis not present

## 2016-03-12 DIAGNOSIS — R29898 Other symptoms and signs involving the musculoskeletal system: Secondary | ICD-10-CM | POA: Diagnosis not present

## 2016-03-12 DIAGNOSIS — Z9981 Dependence on supplemental oxygen: Secondary | ICD-10-CM | POA: Diagnosis not present

## 2016-03-12 DIAGNOSIS — Z8673 Personal history of transient ischemic attack (TIA), and cerebral infarction without residual deficits: Secondary | ICD-10-CM | POA: Diagnosis not present

## 2016-03-12 DIAGNOSIS — J449 Chronic obstructive pulmonary disease, unspecified: Secondary | ICD-10-CM | POA: Diagnosis not present

## 2016-03-13 DIAGNOSIS — J449 Chronic obstructive pulmonary disease, unspecified: Secondary | ICD-10-CM | POA: Diagnosis not present

## 2016-03-14 DIAGNOSIS — Z7901 Long term (current) use of anticoagulants: Secondary | ICD-10-CM | POA: Diagnosis not present

## 2016-03-14 DIAGNOSIS — L03115 Cellulitis of right lower limb: Secondary | ICD-10-CM | POA: Diagnosis not present

## 2016-03-14 DIAGNOSIS — I739 Peripheral vascular disease, unspecified: Secondary | ICD-10-CM | POA: Diagnosis not present

## 2016-03-14 DIAGNOSIS — J449 Chronic obstructive pulmonary disease, unspecified: Secondary | ICD-10-CM | POA: Diagnosis not present

## 2016-03-14 DIAGNOSIS — I482 Chronic atrial fibrillation: Secondary | ICD-10-CM | POA: Diagnosis not present

## 2016-03-15 DIAGNOSIS — I10 Essential (primary) hypertension: Secondary | ICD-10-CM | POA: Diagnosis not present

## 2016-03-15 DIAGNOSIS — Z8673 Personal history of transient ischemic attack (TIA), and cerebral infarction without residual deficits: Secondary | ICD-10-CM | POA: Diagnosis not present

## 2016-03-15 DIAGNOSIS — J449 Chronic obstructive pulmonary disease, unspecified: Secondary | ICD-10-CM | POA: Diagnosis not present

## 2016-03-15 DIAGNOSIS — I4891 Unspecified atrial fibrillation: Secondary | ICD-10-CM | POA: Diagnosis not present

## 2016-03-15 DIAGNOSIS — R29898 Other symptoms and signs involving the musculoskeletal system: Secondary | ICD-10-CM | POA: Diagnosis not present

## 2016-03-15 DIAGNOSIS — Z7952 Long term (current) use of systemic steroids: Secondary | ICD-10-CM | POA: Diagnosis not present

## 2016-03-15 DIAGNOSIS — Z7984 Long term (current) use of oral hypoglycemic drugs: Secondary | ICD-10-CM | POA: Diagnosis not present

## 2016-03-15 DIAGNOSIS — Z792 Long term (current) use of antibiotics: Secondary | ICD-10-CM | POA: Diagnosis not present

## 2016-03-15 DIAGNOSIS — E1140 Type 2 diabetes mellitus with diabetic neuropathy, unspecified: Secondary | ICD-10-CM | POA: Diagnosis not present

## 2016-03-15 DIAGNOSIS — Z7901 Long term (current) use of anticoagulants: Secondary | ICD-10-CM | POA: Diagnosis not present

## 2016-03-15 DIAGNOSIS — R479 Unspecified speech disturbances: Secondary | ICD-10-CM | POA: Diagnosis not present

## 2016-03-15 DIAGNOSIS — Z9981 Dependence on supplemental oxygen: Secondary | ICD-10-CM | POA: Diagnosis not present

## 2016-03-19 DIAGNOSIS — Z9981 Dependence on supplemental oxygen: Secondary | ICD-10-CM | POA: Diagnosis not present

## 2016-03-19 DIAGNOSIS — I4891 Unspecified atrial fibrillation: Secondary | ICD-10-CM | POA: Diagnosis not present

## 2016-03-19 DIAGNOSIS — Z7901 Long term (current) use of anticoagulants: Secondary | ICD-10-CM | POA: Diagnosis not present

## 2016-03-19 DIAGNOSIS — I10 Essential (primary) hypertension: Secondary | ICD-10-CM | POA: Diagnosis not present

## 2016-03-19 DIAGNOSIS — R479 Unspecified speech disturbances: Secondary | ICD-10-CM | POA: Diagnosis not present

## 2016-03-19 DIAGNOSIS — Z7984 Long term (current) use of oral hypoglycemic drugs: Secondary | ICD-10-CM | POA: Diagnosis not present

## 2016-03-19 DIAGNOSIS — J449 Chronic obstructive pulmonary disease, unspecified: Secondary | ICD-10-CM | POA: Diagnosis not present

## 2016-03-19 DIAGNOSIS — Z8673 Personal history of transient ischemic attack (TIA), and cerebral infarction without residual deficits: Secondary | ICD-10-CM | POA: Diagnosis not present

## 2016-03-19 DIAGNOSIS — R29898 Other symptoms and signs involving the musculoskeletal system: Secondary | ICD-10-CM | POA: Diagnosis not present

## 2016-03-19 DIAGNOSIS — E1140 Type 2 diabetes mellitus with diabetic neuropathy, unspecified: Secondary | ICD-10-CM | POA: Diagnosis not present

## 2016-03-19 DIAGNOSIS — Z7952 Long term (current) use of systemic steroids: Secondary | ICD-10-CM | POA: Diagnosis not present

## 2016-03-19 DIAGNOSIS — Z792 Long term (current) use of antibiotics: Secondary | ICD-10-CM | POA: Diagnosis not present

## 2016-03-20 DIAGNOSIS — L03115 Cellulitis of right lower limb: Secondary | ICD-10-CM | POA: Diagnosis not present

## 2016-03-20 DIAGNOSIS — I739 Peripheral vascular disease, unspecified: Secondary | ICD-10-CM | POA: Diagnosis not present

## 2016-03-21 DIAGNOSIS — Z7984 Long term (current) use of oral hypoglycemic drugs: Secondary | ICD-10-CM | POA: Diagnosis not present

## 2016-03-21 DIAGNOSIS — Z9981 Dependence on supplemental oxygen: Secondary | ICD-10-CM | POA: Diagnosis not present

## 2016-03-21 DIAGNOSIS — Z7952 Long term (current) use of systemic steroids: Secondary | ICD-10-CM | POA: Diagnosis not present

## 2016-03-21 DIAGNOSIS — I4891 Unspecified atrial fibrillation: Secondary | ICD-10-CM | POA: Diagnosis not present

## 2016-03-21 DIAGNOSIS — I10 Essential (primary) hypertension: Secondary | ICD-10-CM | POA: Diagnosis not present

## 2016-03-21 DIAGNOSIS — Z7901 Long term (current) use of anticoagulants: Secondary | ICD-10-CM | POA: Diagnosis not present

## 2016-03-21 DIAGNOSIS — Z792 Long term (current) use of antibiotics: Secondary | ICD-10-CM | POA: Diagnosis not present

## 2016-03-21 DIAGNOSIS — J449 Chronic obstructive pulmonary disease, unspecified: Secondary | ICD-10-CM | POA: Diagnosis not present

## 2016-03-21 DIAGNOSIS — R479 Unspecified speech disturbances: Secondary | ICD-10-CM | POA: Diagnosis not present

## 2016-03-21 DIAGNOSIS — R29898 Other symptoms and signs involving the musculoskeletal system: Secondary | ICD-10-CM | POA: Diagnosis not present

## 2016-03-21 DIAGNOSIS — E1140 Type 2 diabetes mellitus with diabetic neuropathy, unspecified: Secondary | ICD-10-CM | POA: Diagnosis not present

## 2016-03-21 DIAGNOSIS — Z8673 Personal history of transient ischemic attack (TIA), and cerebral infarction without residual deficits: Secondary | ICD-10-CM | POA: Diagnosis not present

## 2016-03-27 DIAGNOSIS — J449 Chronic obstructive pulmonary disease, unspecified: Secondary | ICD-10-CM | POA: Diagnosis not present

## 2016-03-27 DIAGNOSIS — Z7901 Long term (current) use of anticoagulants: Secondary | ICD-10-CM | POA: Diagnosis not present

## 2016-03-27 DIAGNOSIS — Z792 Long term (current) use of antibiotics: Secondary | ICD-10-CM | POA: Diagnosis not present

## 2016-03-27 DIAGNOSIS — E1140 Type 2 diabetes mellitus with diabetic neuropathy, unspecified: Secondary | ICD-10-CM | POA: Diagnosis not present

## 2016-03-27 DIAGNOSIS — R29898 Other symptoms and signs involving the musculoskeletal system: Secondary | ICD-10-CM | POA: Diagnosis not present

## 2016-03-27 DIAGNOSIS — Z8673 Personal history of transient ischemic attack (TIA), and cerebral infarction without residual deficits: Secondary | ICD-10-CM | POA: Diagnosis not present

## 2016-03-27 DIAGNOSIS — R479 Unspecified speech disturbances: Secondary | ICD-10-CM | POA: Diagnosis not present

## 2016-03-27 DIAGNOSIS — Z7952 Long term (current) use of systemic steroids: Secondary | ICD-10-CM | POA: Diagnosis not present

## 2016-03-27 DIAGNOSIS — Z9981 Dependence on supplemental oxygen: Secondary | ICD-10-CM | POA: Diagnosis not present

## 2016-03-27 DIAGNOSIS — Z7984 Long term (current) use of oral hypoglycemic drugs: Secondary | ICD-10-CM | POA: Diagnosis not present

## 2016-03-27 DIAGNOSIS — I4891 Unspecified atrial fibrillation: Secondary | ICD-10-CM | POA: Diagnosis not present

## 2016-03-27 DIAGNOSIS — I10 Essential (primary) hypertension: Secondary | ICD-10-CM | POA: Diagnosis not present

## 2016-03-29 DIAGNOSIS — I4891 Unspecified atrial fibrillation: Secondary | ICD-10-CM | POA: Diagnosis not present

## 2016-03-29 DIAGNOSIS — E1140 Type 2 diabetes mellitus with diabetic neuropathy, unspecified: Secondary | ICD-10-CM | POA: Diagnosis not present

## 2016-03-29 DIAGNOSIS — Z9981 Dependence on supplemental oxygen: Secondary | ICD-10-CM | POA: Diagnosis not present

## 2016-03-29 DIAGNOSIS — I10 Essential (primary) hypertension: Secondary | ICD-10-CM | POA: Diagnosis not present

## 2016-03-29 DIAGNOSIS — J449 Chronic obstructive pulmonary disease, unspecified: Secondary | ICD-10-CM | POA: Diagnosis not present

## 2016-03-29 DIAGNOSIS — R479 Unspecified speech disturbances: Secondary | ICD-10-CM | POA: Diagnosis not present

## 2016-03-29 DIAGNOSIS — Z7984 Long term (current) use of oral hypoglycemic drugs: Secondary | ICD-10-CM | POA: Diagnosis not present

## 2016-03-29 DIAGNOSIS — Z792 Long term (current) use of antibiotics: Secondary | ICD-10-CM | POA: Diagnosis not present

## 2016-03-29 DIAGNOSIS — Z7901 Long term (current) use of anticoagulants: Secondary | ICD-10-CM | POA: Diagnosis not present

## 2016-03-29 DIAGNOSIS — Z7952 Long term (current) use of systemic steroids: Secondary | ICD-10-CM | POA: Diagnosis not present

## 2016-03-29 DIAGNOSIS — Z8673 Personal history of transient ischemic attack (TIA), and cerebral infarction without residual deficits: Secondary | ICD-10-CM | POA: Diagnosis not present

## 2016-03-29 DIAGNOSIS — R29898 Other symptoms and signs involving the musculoskeletal system: Secondary | ICD-10-CM | POA: Diagnosis not present

## 2016-04-01 DIAGNOSIS — M86172 Other acute osteomyelitis, left ankle and foot: Secondary | ICD-10-CM | POA: Diagnosis not present

## 2016-04-01 DIAGNOSIS — L03032 Cellulitis of left toe: Secondary | ICD-10-CM | POA: Diagnosis not present

## 2016-04-02 ENCOUNTER — Other Ambulatory Visit (HOSPITAL_COMMUNITY): Payer: Self-pay | Admitting: Family

## 2016-04-02 DIAGNOSIS — E1140 Type 2 diabetes mellitus with diabetic neuropathy, unspecified: Secondary | ICD-10-CM | POA: Diagnosis not present

## 2016-04-02 DIAGNOSIS — J449 Chronic obstructive pulmonary disease, unspecified: Secondary | ICD-10-CM | POA: Diagnosis not present

## 2016-04-02 DIAGNOSIS — Z792 Long term (current) use of antibiotics: Secondary | ICD-10-CM | POA: Diagnosis not present

## 2016-04-02 DIAGNOSIS — R29898 Other symptoms and signs involving the musculoskeletal system: Secondary | ICD-10-CM | POA: Diagnosis not present

## 2016-04-02 DIAGNOSIS — Z7952 Long term (current) use of systemic steroids: Secondary | ICD-10-CM | POA: Diagnosis not present

## 2016-04-02 DIAGNOSIS — R479 Unspecified speech disturbances: Secondary | ICD-10-CM | POA: Diagnosis not present

## 2016-04-02 DIAGNOSIS — Z7984 Long term (current) use of oral hypoglycemic drugs: Secondary | ICD-10-CM | POA: Diagnosis not present

## 2016-04-02 DIAGNOSIS — Z7901 Long term (current) use of anticoagulants: Secondary | ICD-10-CM | POA: Diagnosis not present

## 2016-04-02 DIAGNOSIS — Z8673 Personal history of transient ischemic attack (TIA), and cerebral infarction without residual deficits: Secondary | ICD-10-CM | POA: Diagnosis not present

## 2016-04-02 DIAGNOSIS — Z9981 Dependence on supplemental oxygen: Secondary | ICD-10-CM | POA: Diagnosis not present

## 2016-04-02 DIAGNOSIS — I10 Essential (primary) hypertension: Secondary | ICD-10-CM | POA: Diagnosis not present

## 2016-04-02 DIAGNOSIS — I4891 Unspecified atrial fibrillation: Secondary | ICD-10-CM | POA: Diagnosis not present

## 2016-04-08 DIAGNOSIS — J441 Chronic obstructive pulmonary disease with (acute) exacerbation: Secondary | ICD-10-CM | POA: Diagnosis not present

## 2016-04-08 DIAGNOSIS — E119 Type 2 diabetes mellitus without complications: Secondary | ICD-10-CM | POA: Diagnosis not present

## 2016-04-08 DIAGNOSIS — I639 Cerebral infarction, unspecified: Secondary | ICD-10-CM | POA: Diagnosis not present

## 2016-04-08 DIAGNOSIS — I10 Essential (primary) hypertension: Secondary | ICD-10-CM | POA: Diagnosis not present

## 2016-04-08 DIAGNOSIS — D81818 Other biotin-dependent carboxylase deficiency: Secondary | ICD-10-CM | POA: Diagnosis not present

## 2016-04-09 DIAGNOSIS — R479 Unspecified speech disturbances: Secondary | ICD-10-CM | POA: Diagnosis not present

## 2016-04-09 DIAGNOSIS — J449 Chronic obstructive pulmonary disease, unspecified: Secondary | ICD-10-CM | POA: Diagnosis not present

## 2016-04-09 DIAGNOSIS — I4891 Unspecified atrial fibrillation: Secondary | ICD-10-CM | POA: Diagnosis not present

## 2016-04-09 DIAGNOSIS — Z7901 Long term (current) use of anticoagulants: Secondary | ICD-10-CM | POA: Diagnosis not present

## 2016-04-09 DIAGNOSIS — Z7952 Long term (current) use of systemic steroids: Secondary | ICD-10-CM | POA: Diagnosis not present

## 2016-04-09 DIAGNOSIS — Z7984 Long term (current) use of oral hypoglycemic drugs: Secondary | ICD-10-CM | POA: Diagnosis not present

## 2016-04-09 DIAGNOSIS — E1140 Type 2 diabetes mellitus with diabetic neuropathy, unspecified: Secondary | ICD-10-CM | POA: Diagnosis not present

## 2016-04-09 DIAGNOSIS — Z792 Long term (current) use of antibiotics: Secondary | ICD-10-CM | POA: Diagnosis not present

## 2016-04-09 DIAGNOSIS — R29898 Other symptoms and signs involving the musculoskeletal system: Secondary | ICD-10-CM | POA: Diagnosis not present

## 2016-04-09 DIAGNOSIS — I10 Essential (primary) hypertension: Secondary | ICD-10-CM | POA: Diagnosis not present

## 2016-04-09 DIAGNOSIS — Z9981 Dependence on supplemental oxygen: Secondary | ICD-10-CM | POA: Diagnosis not present

## 2016-04-09 DIAGNOSIS — Z8673 Personal history of transient ischemic attack (TIA), and cerebral infarction without residual deficits: Secondary | ICD-10-CM | POA: Diagnosis not present

## 2016-04-12 DIAGNOSIS — J449 Chronic obstructive pulmonary disease, unspecified: Secondary | ICD-10-CM | POA: Diagnosis not present

## 2016-04-16 ENCOUNTER — Encounter (HOSPITAL_COMMUNITY): Payer: Self-pay | Admitting: *Deleted

## 2016-04-16 MED ORDER — CEFAZOLIN SODIUM-DEXTROSE 2-4 GM/100ML-% IV SOLN
2.0000 g | INTRAVENOUS | Status: AC
  Administered 2016-04-17: 2 g via INTRAVENOUS
  Filled 2016-04-16: qty 100

## 2016-04-16 NOTE — Progress Notes (Signed)
Spoke with pt's wife, [name] for pre-op call. She states pt has not had any recent chest pain. Has history of A-fib, Dr. Einar Gip is his cardiologist. Pt is diabetic, on oral medication only. She states his fasting blood sugar usually is between 80-102. Last A1C in EPIC was 5.8 on 09/13/15. Have requested a more recent result from Dr. Jeneen Rinks Kim's office. Ms. [name] states Dr. Sharol Given did not instruct pt to stop Eliquis

## 2016-04-17 ENCOUNTER — Ambulatory Visit (HOSPITAL_COMMUNITY): Payer: Medicare Other | Admitting: Anesthesiology

## 2016-04-17 ENCOUNTER — Encounter (HOSPITAL_COMMUNITY): Payer: Self-pay | Admitting: *Deleted

## 2016-04-17 ENCOUNTER — Encounter (HOSPITAL_COMMUNITY)
Admission: RE | Disposition: A | Discharge status: Home / Self Care | Payer: Self-pay | Source: Ambulatory Visit | Attending: Orthopedic Surgery

## 2016-04-17 ENCOUNTER — Ambulatory Visit (HOSPITAL_COMMUNITY)
Admission: RE | Admit: 2016-04-17 | Discharge: 2016-04-17 | Disposition: A | Discharge status: Home / Self Care | Payer: Medicare Other | Source: Ambulatory Visit | Attending: Orthopedic Surgery | Admitting: Orthopedic Surgery

## 2016-04-17 DIAGNOSIS — I10 Essential (primary) hypertension: Secondary | ICD-10-CM | POA: Insufficient documentation

## 2016-04-17 DIAGNOSIS — M86172 Other acute osteomyelitis, left ankle and foot: Secondary | ICD-10-CM | POA: Diagnosis not present

## 2016-04-17 DIAGNOSIS — Z8673 Personal history of transient ischemic attack (TIA), and cerebral infarction without residual deficits: Secondary | ICD-10-CM | POA: Diagnosis not present

## 2016-04-17 DIAGNOSIS — I2510 Atherosclerotic heart disease of native coronary artery without angina pectoris: Secondary | ICD-10-CM | POA: Insufficient documentation

## 2016-04-17 DIAGNOSIS — K219 Gastro-esophageal reflux disease without esophagitis: Secondary | ICD-10-CM | POA: Insufficient documentation

## 2016-04-17 DIAGNOSIS — E1169 Type 2 diabetes mellitus with other specified complication: Secondary | ICD-10-CM | POA: Insufficient documentation

## 2016-04-17 DIAGNOSIS — J449 Chronic obstructive pulmonary disease, unspecified: Secondary | ICD-10-CM | POA: Diagnosis not present

## 2016-04-17 DIAGNOSIS — S91209A Unspecified open wound of unspecified toe(s) with damage to nail, initial encounter: Secondary | ICD-10-CM | POA: Diagnosis not present

## 2016-04-17 DIAGNOSIS — M869 Osteomyelitis, unspecified: Secondary | ICD-10-CM

## 2016-04-17 DIAGNOSIS — E1151 Type 2 diabetes mellitus with diabetic peripheral angiopathy without gangrene: Secondary | ICD-10-CM | POA: Insufficient documentation

## 2016-04-17 DIAGNOSIS — M868X7 Other osteomyelitis, ankle and foot: Secondary | ICD-10-CM | POA: Diagnosis not present

## 2016-04-17 DIAGNOSIS — E119 Type 2 diabetes mellitus without complications: Secondary | ICD-10-CM | POA: Diagnosis present

## 2016-04-17 DIAGNOSIS — Z87891 Personal history of nicotine dependence: Secondary | ICD-10-CM | POA: Diagnosis not present

## 2016-04-17 DIAGNOSIS — L03032 Cellulitis of left toe: Secondary | ICD-10-CM | POA: Diagnosis not present

## 2016-04-17 DIAGNOSIS — I4891 Unspecified atrial fibrillation: Secondary | ICD-10-CM | POA: Diagnosis not present

## 2016-04-17 HISTORY — DX: Anemia, unspecified: D64.9

## 2016-04-17 HISTORY — PX: AMPUTATION: SHX166

## 2016-04-17 HISTORY — DX: Constipation, unspecified: K59.00

## 2016-04-17 LAB — GLUCOSE, CAPILLARY
GLUCOSE-CAPILLARY: 91 mg/dL (ref 65–99)
Glucose-Capillary: 110 mg/dL — ABNORMAL HIGH (ref 65–99)

## 2016-04-17 LAB — BASIC METABOLIC PANEL
Anion gap: 9 (ref 5–15)
BUN: 35 mg/dL — AB (ref 6–20)
CALCIUM: 8.8 mg/dL — AB (ref 8.9–10.3)
CO2: 25 mmol/L (ref 22–32)
CREATININE: 0.82 mg/dL (ref 0.61–1.24)
Chloride: 105 mmol/L (ref 101–111)
Glucose, Bld: 86 mg/dL (ref 65–99)
Potassium: 4.2 mmol/L (ref 3.5–5.1)
SODIUM: 139 mmol/L (ref 135–145)

## 2016-04-17 LAB — CBC
HCT: 41.1 % (ref 39.0–52.0)
HEMOGLOBIN: 13.2 g/dL (ref 13.0–17.0)
MCH: 32.7 pg (ref 26.0–34.0)
MCHC: 32.1 g/dL (ref 30.0–36.0)
MCV: 101.7 fL — ABNORMAL HIGH (ref 78.0–100.0)
PLATELETS: 177 10*3/uL (ref 150–400)
RBC: 4.04 MIL/uL — ABNORMAL LOW (ref 4.22–5.81)
RDW: 13.7 % (ref 11.5–15.5)
WBC: 6.5 10*3/uL (ref 4.0–10.5)

## 2016-04-17 LAB — PROTIME-INR
INR: 1.4
PROTHROMBIN TIME: 17.3 s — AB (ref 11.4–15.2)

## 2016-04-17 SURGERY — AMPUTATION DIGIT
Anesthesia: Monitor Anesthesia Care | Laterality: Left

## 2016-04-17 MED ORDER — FENTANYL CITRATE (PF) 100 MCG/2ML IJ SOLN
100.0000 ug | Freq: Once | INTRAMUSCULAR | Status: AC
  Administered 2016-04-17: 50 ug via INTRAVENOUS

## 2016-04-17 MED ORDER — CHLORHEXIDINE GLUCONATE 4 % EX LIQD: 60.0000 mL | Freq: Once | CUTANEOUS | Status: DC

## 2016-04-17 MED ORDER — MIDAZOLAM HCL 2 MG/2ML IJ SOLN
INTRAMUSCULAR | Status: AC
  Administered 2016-04-17: 1 mg
  Filled 2016-04-17: qty 2

## 2016-04-17 MED ORDER — MIDAZOLAM HCL 5 MG/ML IJ SOLN: 2.0000 mg | Freq: Once | INTRAMUSCULAR | Status: DC

## 2016-04-17 MED ORDER — FENTANYL CITRATE (PF) 100 MCG/2ML IJ SOLN
INTRAMUSCULAR | Status: AC
  Filled 2016-04-17: qty 2

## 2016-04-17 MED ORDER — LACTATED RINGERS IV SOLN
INTRAVENOUS | Status: DC
  Administered 2016-04-17 (×2): via INTRAVENOUS

## 2016-04-17 SURGICAL SUPPLY — 31 items
BLADE SURG 21 STRL SS (BLADE) ×3 IMPLANT
BNDG CMPR 9X4 STRL LF SNTH (GAUZE/BANDAGES/DRESSINGS)
BNDG COHESIVE 4X5 TAN STRL (GAUZE/BANDAGES/DRESSINGS) ×3 IMPLANT
BNDG ESMARK 4X9 LF (GAUZE/BANDAGES/DRESSINGS) IMPLANT
BNDG GAUZE ELAST 4 BULKY (GAUZE/BANDAGES/DRESSINGS) ×3 IMPLANT
COVER SURGICAL LIGHT HANDLE (MISCELLANEOUS) ×6 IMPLANT
DRAPE U-SHAPE 47X51 STRL (DRAPES) ×3 IMPLANT
DRSG ADAPTIC 3X8 NADH LF (GAUZE/BANDAGES/DRESSINGS) ×4 IMPLANT
DRSG PAD ABDOMINAL 8X10 ST (GAUZE/BANDAGES/DRESSINGS) ×3 IMPLANT
DURAPREP 26ML APPLICATOR (WOUND CARE) ×3 IMPLANT
ELECT REM PT RETURN 9FT ADLT (ELECTROSURGICAL) ×3
ELECTRODE REM PT RTRN 9FT ADLT (ELECTROSURGICAL) ×1 IMPLANT
GAUZE SPONGE 4X4 12PLY STRL (GAUZE/BANDAGES/DRESSINGS) IMPLANT
GLOVE BIOGEL PI IND STRL 9 (GLOVE) ×1 IMPLANT
GLOVE BIOGEL PI INDICATOR 9 (GLOVE) ×2
GLOVE SURG ORTHO 9.0 STRL STRW (GLOVE) ×3 IMPLANT
GOWN STRL REUS W/ TWL XL LVL3 (GOWN DISPOSABLE) ×2 IMPLANT
GOWN STRL REUS W/TWL XL LVL3 (GOWN DISPOSABLE) ×6
KIT BASIN OR (CUSTOM PROCEDURE TRAY) ×3 IMPLANT
KIT ROOM TURNOVER OR (KITS) ×3 IMPLANT
MANIFOLD NEPTUNE II (INSTRUMENTS) ×1 IMPLANT
NEEDLE 22X1 1/2 (OR ONLY) (NEEDLE) IMPLANT
NS IRRIG 1000ML POUR BTL (IV SOLUTION) ×3 IMPLANT
PACK ORTHO EXTREMITY (CUSTOM PROCEDURE TRAY) ×3 IMPLANT
PAD ARMBOARD 7.5X6 YLW CONV (MISCELLANEOUS) ×6 IMPLANT
SUCTION FRAZIER HANDLE 10FR (MISCELLANEOUS)
SUCTION TUBE FRAZIER 10FR DISP (MISCELLANEOUS) IMPLANT
SUT ETHILON 2 0 PSLX (SUTURE) ×3 IMPLANT
SYR CONTROL 10ML LL (SYRINGE) IMPLANT
TOWEL OR 17X24 6PK STRL BLUE (TOWEL DISPOSABLE) ×3 IMPLANT
TOWEL OR 17X26 10 PK STRL BLUE (TOWEL DISPOSABLE) ×3 IMPLANT

## 2016-04-17 NOTE — Op Note (Signed)
04/17/2016  11:31 AM  PATIENT:  [name]    PRE-OPERATIVE DIAGNOSIS:  Osteomyelitis Left 2nd Toe  POST-OPERATIVE DIAGNOSIS:  Same  PROCEDURE:  2nd Toe Amputation Left Foot  SURGEON:  [name], MD  PHYSICIAN ASSISTANT:None ANESTHESIA:   General  PREOPERATIVE INDICATIONS:  [name] is a  82 y.o. male with a diagnosis of Osteomyelitis Left 2nd Toe who failed conservative measures and elected for surgical management.    The risks benefits and alternatives were discussed with the patient preoperatively including but not limited to the risks of infection, bleeding, nerve injury, cardiopulmonary complications, the need for revision surgery, among others, and the patient was willing to proceed.  OPERATIVE IMPLANTS: None  OPERATIVE FINDINGS: Minimal petechial bleeding  OPERATIVE PROCEDURE: Patient brought the operating room after undergoing an ankle block. After adequate anesthesia obtained patient's left lower extremity was prepped using DuraPrep draped into a sterile field a timeout was called. A V incision was made around the second toe at the level of the MTP joint. The toe was amputated through the MTP joint and the bone was extremely soft and involved with infection. There is no ascending abscess there is minimal petechial bleeding. The wound was irrigated with normal saline hemostasis was obtained the incision was closed using 2-0 nylon. A sterile compressive dressing was applied patient was taken the PACU in stable condition plan for discharge to home.

## 2016-04-17 NOTE — H&P (Signed)
[name] is an 82 y.o. male.   Chief Complaint: Osteomyelitis abscess left foot second toe HPI: Patient is a 82 year old gentleman with diabetic insensate neuropathy status post revascularization to the left lower extremity and status post a left great toe amputation who presents at this time with abscess ulceration osteomyelitis left second toe  Past Medical History:  Diagnosis Date  . Anemia   . Arthritis    "legs" (02/24/2014)  . Cellulitis and abscess of toe 08/2015   LEFT FOOT   . Claudication of calf muscles ([place]) 11/05/2012   right calf  . Constipation   . COPD (chronic obstructive pulmonary disease) ([place])   . Coronary artery disease   . Dysrhythmia    atrial fibrilation  . GERD (gastroesophageal reflux disease)   . Hypertension   . Macular degeneration, bilateral    "has had shots in both eyes"  . Neuromuscular disorder ([facility])    dizziness  . On home oxygen therapy    "2L; w/activity" (02/24/2014)  . Peripheral vascular disease ([place])   . Pneumonia    "5 times back to back recently (just finished antibiiotic) " (02/24/2014)  . Shortness of breath    with exertion  . Stroke ([place]) 05/2013   "can't use left arm fully since"  . TIA (transient ischemic attack) 2010  . Type II diabetes mellitus ([place])     Past Surgical History:  Procedure Laterality Date  . AMPUTATION Left 09/12/2015   Procedure: LEFT GREAT TOE AMPUTATION;  Surgeon: Meredith Pel, MD;  Location: [place] [facility];  Service: Orthopedics;  Laterality: Left;  . CARDIAC CATHETERIZATION    . CHEST TUBE INSERTION Left 02/24/2014   Procedure: INSERTION PLEURAL DRAINAGE CATHETER;  Surgeon: Ivin Poot, MD;  Location: [place];  Service: Thoracic;  Laterality: Left;  . COLONOSCOPY    . ESOPHAGOGASTRODUODENOSCOPY N/A 11/02/2013   Procedure: ESOPHAGOGASTRODUODENOSCOPY (EGD);  Surgeon: Cleotis Nipper, MD;  Location: [facility] ENDOSCOPY;  Service: Endoscopy;  Laterality: N/A;  . ESOPHAGOGASTRODUODENOSCOPY (EGD) WITH PROPOFOL N/A  07/06/2014   Procedure: ESOPHAGOGASTRODUODENOSCOPY (EGD) WITH PROPOFOL;  Surgeon: Cleotis Nipper, MD;  Location: [place];  Service: Endoscopy;  Laterality: N/A;  . FEMORAL ARTERY STENT Left 10/2012  . FOOT SURGERY Left    due to broken foot years ago  . LOWER EXTREMITY ANGIOGRAM N/A 11/06/2012   Procedure: LOWER EXTREMITY ANGIOGRAM;  Surgeon: Laverda Page, MD;  Location: [facility] CATH LAB;  Service: Cardiovascular;  Laterality: N/A;  . PERIPHERAL VASCULAR CATHETERIZATION N/A 11/20/2015   Procedure: Lower Extremity Angiography;  Surgeon: Adrian Prows, MD;  Location: [place] CV LAB;  Service: Cardiovascular;  Laterality: N/A;  . PERIPHERAL VASCULAR CATHETERIZATION  11/20/2015   Procedure: Peripheral Vascular Atherectomy;  Surgeon: Adrian Prows, MD;  Location: [facility] CV LAB;  Service: Cardiovascular;;  . REMOVAL OF PLEURAL DRAINAGE CATHETER Left 05/03/2014   Procedure: REMOVAL OF PLEURAL DRAINAGE CATHETER;  Surgeon: Ivin Poot, MD;  Location: [facility];  Service: Thoracic;  Laterality: Left;  . TRANSURETHRAL RESECTION OF PROSTATE    . VASCULAR SURGERY     stents to legs    Family History  Problem Relation Age of Onset  . Hypertension Other   . Cancer Neg Hx   . CAD Neg Hx    Social History:  reports that he quit smoking about 34 years ago. His smoking use included Cigarettes. He has a 105.00 pack-year smoking history. He has never used smokeless tobacco. He reports that he drinks alcohol. He reports  that he does not use drugs.  Allergies:  Allergies  Allergen Reactions  . No Known Allergies     No prescriptions prior to admission.    No results found for this or any previous visit (from the past 48 hour(s)). No results found.  Review of Systems  All other systems reviewed and are negative.   There were no vitals taken for this visit. Physical Exam   On examination patient has sausage digit swelling ulceration osteomyelitis and cellulitis of the left foot second toe  status post great toe amputation. Assessment/Plan Assessment: Diabetic insensate neuropathy with peripheral vascular disease with osteomyelitis abscess and ulceration left foot second toe.  Plan: We'll plan for left foot second toe amputation. Risk and benefits were discussed including risk of the wound not healing. Patient states he understands and wishes to proceed at this time.  [name], MD 04/17/2016, 6:41 AM

## 2016-04-17 NOTE — Anesthesia Preprocedure Evaluation (Addendum)
Anesthesia Evaluation  Patient identified by MRN, date of birth, ID band Patient awake    Reviewed: Allergy & Precautions, NPO status   Airway Mallampati: II  TM Distance: >3 FB     Dental   Pulmonary shortness of breath, pneumonia, COPD, former smoker,    breath sounds clear to auscultation       Cardiovascular hypertension, + CAD and + Peripheral Vascular Disease  + dysrhythmias  Rhythm:Regular Rate:Normal     Neuro/Psych    GI/Hepatic Neg liver ROS, GERD  ,  Endo/Other  diabetes  Renal/GU Renal disease     Musculoskeletal  (+) Arthritis ,   Abdominal   Peds  Hematology  (+) anemia ,   Anesthesia Other Findings   Reproductive/Obstetrics                             Anesthesia Physical  Anesthesia Plan  ASA: III  Anesthesia Plan: MAC and Regional   Post-op Pain Management:    Induction: Intravenous  Airway Management Planned:   Additional Equipment:   Intra-op Plan:   Post-operative Plan:   Informed Consent: I have reviewed the patients History and Physical, chart, labs and discussed the procedure including the risks, benefits and alternatives for the proposed anesthesia with the patient or authorized representative who has indicated his/her understanding and acceptance.   Dental advisory given  Plan Discussed with: CRNA and Anesthesiologist  Anesthesia Plan Comments:         Anesthesia Quick Evaluation

## 2016-04-17 NOTE — Transfer of Care (Signed)
Immediate Anesthesia Transfer of Care Note  Patient: [name]  Procedure(s) Performed: Procedure(s): 2nd Toe Amputation Left Foot (Left)  Patient Location: PACU  Anesthesia Type:MAC and Regional  Level of Consciousness: awake, alert , oriented and sedated  Airway & Oxygen Therapy: Patient Spontanous Breathing and Patient connected to nasal cannula oxygen  Post-op Assessment: Report given to RN, Post -op Vital signs reviewed and stable and Patient moving all extremities  Post vital signs: Reviewed and stable  Last Vitals:  Vitals:   04/17/16 0917 04/17/16 0919  BP:  119/71  Temp: 36.5 C     Last Pain:  Vitals:   04/17/16 0917  TempSrc: Oral      Patients Stated Pain Goal: 5 ([id number])  Complications: No apparent anesthesia complications

## 2016-04-17 NOTE — Anesthesia Procedure Notes (Signed)
Anesthesia Regional Block:  Ankle block  Pre-Anesthetic Checklist: ,, timeout performed, Correct Patient, Correct Site, Correct Laterality, Correct Procedure, Correct Position, site marked, Risks and benefits discussed, at surgeon's request  Laterality: Left  Prep: chloraprep       Needles:  Injection technique: Single-shot     Needle Length: 4cm 4 cm Needle Gauge: 21 and 21 G    Additional Needles: Ankle block Narrative:  Anesthesiologist: Lyndle Herrlich  Additional Notes: Field block 10 cc Saphaneous and 10cc to deep tibial nerve ( 1.5% Xylo w/epi 1:200 k)

## 2016-04-17 NOTE — Anesthesia Postprocedure Evaluation (Signed)
Anesthesia Post Note  Patient: [name]  Procedure(s) Performed: Procedure(s) (LRB): 2nd Toe Amputation Left Foot (Left)  Patient location during evaluation: PACU Anesthesia Type: General Level of consciousness: sedated Pain management: satisfactory to patient Vital Signs Assessment: post-procedure vital signs reviewed and stable Respiratory status: spontaneous breathing Cardiovascular status: stable Anesthetic complications: no    Last Vitals:  Vitals:   04/17/16 1200 04/17/16 1214  BP: 135/87 133/81  Pulse: 75 65  Resp:    Temp: 36.7 C     Last Pain:  Vitals:   04/17/16 1214  TempSrc:   PainSc: 0-No pain                 Coila Wardell EDWARD

## 2016-04-18 ENCOUNTER — Encounter (HOSPITAL_COMMUNITY): Payer: Self-pay | Admitting: Orthopedic Surgery

## 2016-05-02 DIAGNOSIS — I70262 Atherosclerosis of native arteries of extremities with gangrene, left leg: Secondary | ICD-10-CM | POA: Diagnosis not present

## 2016-05-02 DIAGNOSIS — M25572 Pain in left ankle and joints of left foot: Secondary | ICD-10-CM | POA: Diagnosis not present

## 2016-05-08 ENCOUNTER — Encounter: Payer: Self-pay | Admitting: Internal Medicine

## 2016-05-08 ENCOUNTER — Ambulatory Visit (INDEPENDENT_AMBULATORY_CARE_PROVIDER_SITE_OTHER): Payer: Medicare Other | Admitting: Internal Medicine

## 2016-05-08 ENCOUNTER — Ambulatory Visit (INDEPENDENT_AMBULATORY_CARE_PROVIDER_SITE_OTHER)
Admission: RE | Admit: 2016-05-08 | Discharge: 2016-05-08 | Disposition: A | Discharge status: Home / Self Care | Payer: Medicare Other | Source: Ambulatory Visit | Attending: Internal Medicine | Admitting: Internal Medicine

## 2016-05-08 ENCOUNTER — Ambulatory Visit (HOSPITAL_COMMUNITY)
Admission: RE | Admit: 2016-05-08 | Discharge: 2016-05-08 | Disposition: A | Discharge status: Home / Self Care | Payer: Self-pay | Source: Ambulatory Visit | Attending: Internal Medicine | Admitting: Internal Medicine

## 2016-05-08 ENCOUNTER — Other Ambulatory Visit: Payer: Self-pay | Admitting: Internal Medicine

## 2016-05-08 VITALS — BP 102/58 | HR 78

## 2016-05-08 DIAGNOSIS — J439 Emphysema, unspecified: Secondary | ICD-10-CM | POA: Diagnosis not present

## 2016-05-08 DIAGNOSIS — J9611 Chronic respiratory failure with hypoxia: Secondary | ICD-10-CM

## 2016-05-08 DIAGNOSIS — J209 Acute bronchitis, unspecified: Secondary | ICD-10-CM

## 2016-05-08 DIAGNOSIS — R05 Cough: Secondary | ICD-10-CM | POA: Diagnosis not present

## 2016-05-08 DIAGNOSIS — R0602 Shortness of breath: Secondary | ICD-10-CM | POA: Diagnosis not present

## 2016-05-08 MED ORDER — LEVOFLOXACIN 500 MG PO TABS: 500.0000 mg | ORAL_TABLET | Freq: Every day | ORAL | 0 refills | Status: DC

## 2016-05-08 NOTE — Progress Notes (Signed)
Subjective:     Patient ID: [name], male   DOB: 1928-07-07, 82 y.o.   MRN: [id number]  HPI   OV 06/23/2015  Chief Complaint  Patient presents with  . Follow-up    [name] suggested pt come for f/u. Pt c/o SOB with activity and rest, prod cough with thick white mucus. Pt c/o left lateral rib pain.     This is an 82 year old debilitated gentleman who suffered a stroke 3 years ago. I had seen him a year ago for left-sided pleural effusion that initially was exudative but on repeat was transudative. It was resolved that this was likely due to aspiration pneumonia on the left side related passive pleural effusion. After that he was lost to follow-up.  Currently he presents with his significant other of 40 years. She is unsure why they're here but review of outside records from [name] and review of the radiologic images that I personally visualized so that at least back in March 2016 chest x-ray his left-sided effusion had resolved and his right-sided also looked fine. Then by June 2016 he developed small right-sided pleural effusion was confirmed in September 2016. and chest x-ray. This resulted in a CT chest on 06/14/2015 that shows right lower lobe pneumonia with passive associated small pleural effusion. Wife denies any fever but reports ongoing cough that is likely chronic with white sputum. Patient is currently on Levaquin for 10 days. Off note, on the CT does report of possible right lower lobe pulmonary embolism but is not clear-cut  Wife reports that since the stroke 3 years ago he is slowly and steadily improved. He is still physically deconditioned. He lives a chair existence. He occasionally walks to the front porch and to the dining table. She denies aspiration but when I questioned her more carefully she does admit aspiration with liquids such as coffee. In addition October 2015 swallow study showed continued dysphagia and need for dysphagia 3 diet. Patient currently is on a full  regular diet   07/21/2015 Follow up : PNA ? Aspiration  Pt returns with his wife. For 1 month follow up  He has recently been dx with PNA and PE . He had a CT chest 06/14/15 that showed subacute to chronic right PE . Bibasilar opacities . He was started on coumadin and treated with abx.  He returns today for follow up . CXR shows LLL  Persistent masslike opacity .  He has had a increaesd wet cough with yellow mucus, wheezing and SOB. He was seen by PCP  And started on levaquin ,he is on day 5/7 .  He is starting to feel better.  No hemoptysis, chest pain, orthopnea, fever, edema.    OV 05/08/2016  Chief Complaint  Patient presents with  . Acute Visit    Pt having increased breathing issues x 1 month. Pt having thick clumpy mucus production which is yellow in color. Increased SOB, decreased endurance. Pt notes some chest pain that radiates to his back. Cough is very wet sounding. Pt not eating well - decreased appetite as of 05/07/16    82 year old debilitated male with a history of COPD not otherwise specified and chronic oxygen therapy and chronic hypoxemic respiratory failure. He presents with his significant other and daughter. I have not seen him in almost one year. Since I last saw him according to his significant other and daughter he has improved he did he maintains himself on chronic oxygen therapy. He walks around inside the house  and is able to do his activities of daily living on a limited basis. He is mostly sedentary and sits on the porch but he is able to walk between the bedroom and bathroom and self-care. He is also able to get room to room. At baseline he did not have much shortness of breath or sputum. Then approximately 3 weeks ago he underwent a second amputation of the toes in his left foot by Dr. Sharol Given. Then approximately starting 1 week ago started having increased cough with brown and yellow sputum significant amount of junky sounding. This is associated with worsening  shortness of breath. Although they deny any fever, hemoptysis, chills or wheezing that is worse. In addition they do not think his oxygen requirement for any worse. They could not see the primary care physician due to traffic situation and the made an acute visit here  Blood work 04/17/2016 shows normal creatinine 0.82 mg percent and hemoglobin 13.2 g percent and INR 1.4  Chest x-ray 11/12/2015: Personally visualized looks clear to me other than emphysema/hyperinflation   has a past medical history of Anemia; Arthritis; Cellulitis and abscess of toe (08/2015); Claudication of calf muscles ([place]) (11/05/2012); Constipation; COPD (chronic obstructive pulmonary disease) ([place]); Coronary artery disease; Dysrhythmia; GERD (gastroesophageal reflux disease); Hypertension; Macular degeneration, bilateral; Neuromuscular disorder ([place]); On home oxygen therapy; Peripheral vascular disease ([place]); Pneumonia; Shortness of breath; Stroke ([place]) (05/2013); TIA (transient ischemic attack) (2010); and Type II diabetes mellitus ([place]).   reports that he quit smoking about 34 years ago. His smoking use included Cigarettes. He has a 105.00 pack-year smoking history. He has never used smokeless tobacco.  Past Surgical History:  Procedure Laterality Date  . AMPUTATION Left 09/12/2015   Procedure: LEFT GREAT TOE AMPUTATION;  Surgeon: Meredith Pel, MD;  Location: [facility];  Service: Orthopedics;  Laterality: Left;  . AMPUTATION Left 04/17/2016   Procedure: 2nd Toe Amputation Left Foot;  Surgeon: [name], MD;  Location: [place];  Service: Orthopedics;  Laterality: Left;  . CARDIAC CATHETERIZATION    . CHEST TUBE INSERTION Left 02/24/2014   Procedure: INSERTION PLEURAL DRAINAGE CATHETER;  Surgeon: Ivin Poot, MD;  Location: [place];  Service: Thoracic;  Laterality: Left;  . COLONOSCOPY    . ESOPHAGOGASTRODUODENOSCOPY N/A 11/02/2013   Procedure: ESOPHAGOGASTRODUODENOSCOPY (EGD);  Surgeon: Cleotis Nipper, MD;  Location:  [facility]OSCOPY;  Service: Endoscopy;  Laterality: N/A;  . ESOPHAGOGASTRODUODENOSCOPY (EGD) WITH PROPOFOL N/A 07/06/2014   Procedure: ESOPHAGOGASTRODUODENOSCOPY (EGD) WITH PROPOFOL;  Surgeon: Cleotis Nipper, MD;  Location: [place];  Service: Endoscopy;  Laterality: N/A;  . FEMORAL ARTERY STENT Left 10/2012  . FOOT SURGERY Left    due to broken foot years ago  . LOWER EXTREMITY ANGIOGRAM N/A 11/06/2012   Procedure: LOWER EXTREMITY ANGIOGRAM;  Surgeon: Laverda Page, MD;  Location: [facility] CATH LAB;  Service: Cardiovascular;  Laterality: N/A;  . PERIPHERAL VASCULAR CATHETERIZATION N/A 11/20/2015   Procedure: Lower Extremity Angiography;  Surgeon: Adrian Prows, MD;  Location: [place] CV LAB;  Service: Cardiovascular;  Laterality: N/A;  . PERIPHERAL VASCULAR CATHETERIZATION  11/20/2015   Procedure: Peripheral Vascular Atherectomy;  Surgeon: Adrian Prows, MD;  Location: [place] CV LAB;  Service: Cardiovascular;;  . REMOVAL OF PLEURAL DRAINAGE CATHETER Left 05/03/2014   Procedure: REMOVAL OF PLEURAL DRAINAGE CATHETER;  Surgeon: Ivin Poot, MD;  Location: [place];  Service: Thoracic;  Laterality: Left;  . TRANSURETHRAL RESECTION OF PROSTATE    . VASCULAR SURGERY  stents to legs    Allergies  Allergen Reactions  . No Known Allergies     Immunization History  Administered Date(s) Administered  . Influenza Split 06/23/2013  . Influenza,inj,Quad PF,36+ Mos 06/24/2015  . Pneumococcal Polysaccharide-23 12/05/2013    Family History  Problem Relation Age of Onset  . Hypertension Other   . Cancer Neg Hx   . CAD Neg Hx      Current Outpatient Prescriptions:  .  acetaminophen (TYLENOL) 325 MG tablet, Take 2 tablets (650 mg total) by mouth every 4 (four) hours as needed for mild pain., Disp: 100 tablet, Rfl: 0 .  albuterol (PROVENTIL) (2.5 MG/3ML) 0.083% nebulizer solution, Take 2.5 mg by nebulization 4 (four) times daily., Disp: , Rfl:  .  allopurinol (ZYLOPRIM) 100 MG tablet, Take 100  mg by mouth 2 (two) times daily. , Disp: , Rfl:  .  budesonide (PULMICORT) 0.5 MG/2ML nebulizer solution, Take 0.5 mg by nebulization 2 (two) times daily as needed (for wheezing/shortness of breath). , Disp: , Rfl:  .  carvedilol (COREG) 6.25 MG tablet, Take 6.25 mg by mouth 2 (two) times daily with a meal., Disp: , Rfl:  .  ELIQUIS 5 MG TABS tablet, Take 5 mg by mouth 2 (two) times daily. , Disp: , Rfl:  .  magnesium oxide (MAG-OX) 400 MG tablet, Take 200 mg by mouth daily., Disp: , Rfl:  .  Melatonin 3 MG TABS, Take 3 mg by mouth at bedtime as needed (sleep). , Disp: , Rfl:  .  metFORMIN (GLUCOPHAGE) 1000 MG tablet, Take 0.5 tablets (500 mg total) by mouth 2 (two) times daily with a meal., Disp: , Rfl:  .  mirtazapine (REMERON) 15 MG tablet, Take 15 mg by mouth at bedtime., Disp: , Rfl:  .  Multiple Vitamins-Minerals (OCUVITE PRESERVISION PO), Take 1 tablet by mouth daily., Disp: , Rfl:  .  pantoprazole (PROTONIX) 40 MG tablet, Take 1 tablet (40 mg total) by mouth 2 (two) times daily., Disp: 30 tablet, Rfl: 0 .  simvastatin (ZOCOR) 40 MG tablet, Take 40 mg by mouth every evening., Disp: , Rfl:  .  tiotropium (SPIRIVA) 18 MCG inhalation capsule, Place 18 mcg into inhaler and inhale daily., Disp: , Rfl:  .  vitamin B-12 (CYANOCOBALAMIN) 1000 MCG tablet, Take 1,000 mcg by mouth daily., Disp: , Rfl:  .  colchicine 0.6 MG tablet, Take 1 tablet (0.6 mg total) by mouth 2 (two) times daily. (Patient not taking: Reported on 05/08/2016), Disp: 10 tablet, Rfl: 0    Review of Systems     Objective:   Physical Exam  Vitals:   05/08/16 1556  BP: (!) 102/58  Pulse: 78  SpO2: 96%   Vitals:   05/08/16 1556  BP: (!) 102/58  Pulse: 78  SpO2: 96%   General exam: Frail elderly male seated in a wheelchair. Oxygen on HEENT: Oxygen on periodically coughs junky sounding cough Respiratory exam: Barrel chest oxygen on. No active wheezing but only a few scattered crackles Cardiovascular: Normal heart  sounds irregularly irregular. QTC interpreter 2017 was normal below 500 ms Abdomen soft Skin: Frail and excoriated Extremities: Left foot in a boot     Assessment:       ICD-9-CM ICD-10-CM   1. Acute bronchitis, unspecified organism 466.0 J20.9 DG Chest Portable 2 Views ([facility])  2. Pulmonary emphysema, unspecified emphysema type (HCC) 492.8 J43.9   3. Chronic hypoxemic respiratory failure (HCC) 518.83 J96.11    799.02  Plan:      Start/ take levaquin 500mg  once daily  X 6 days CXR portable 05/08/2016 ; will call with results Any worsening go to ER Continue spirivan and pulmicort as before Continue o2 as before Flu shot in fall  Followup 2 months or sooner if needed     Dr. Brand Males, M.D.,  [facility].C.P Pulmonary and Critical Care Medicine Staff Physician [facility] Pager: [phone], If no answer or between  15:00h - 7:00h: call [phone]  05/08/2016 4:30 PM

## 2016-05-08 NOTE — Patient Instructions (Signed)
ICD-9-CM ICD-10-CM   1. Acute bronchitis, unspecified organism 466.0 J20.9    Start/ take levaquin 500mg  once daily  X 6 days CXR portable 05/08/2016 ; will call with results Any worsening go to ER Continue spirivan and pulmicort as before Continue o2 as before Flu shot in fall  Followup 2 months or sooner if needed

## 2016-05-10 ENCOUNTER — Telehealth: Payer: Self-pay | Admitting: Internal Medicine

## 2016-05-10 ENCOUNTER — Inpatient Hospital Stay (HOSPITAL_COMMUNITY)
Admission: EM | Admit: 2016-05-10 | Discharge: 2016-05-12 | DRG: 190 | Disposition: A | Discharge status: Home Health | Payer: Medicare Other | Attending: Family Medicine | Admitting: Family Medicine

## 2016-05-10 ENCOUNTER — Emergency Department (HOSPITAL_COMMUNITY): Payer: Medicare Other

## 2016-05-10 ENCOUNTER — Encounter (HOSPITAL_COMMUNITY): Payer: Self-pay | Admitting: *Deleted

## 2016-05-10 DIAGNOSIS — L089 Local infection of the skin and subcutaneous tissue, unspecified: Secondary | ICD-10-CM | POA: Diagnosis present

## 2016-05-10 DIAGNOSIS — E785 Hyperlipidemia, unspecified: Secondary | ICD-10-CM | POA: Diagnosis present

## 2016-05-10 DIAGNOSIS — E119 Type 2 diabetes mellitus without complications: Secondary | ICD-10-CM

## 2016-05-10 DIAGNOSIS — J440 Chronic obstructive pulmonary disease with acute lower respiratory infection: Principal | ICD-10-CM | POA: Diagnosis present

## 2016-05-10 DIAGNOSIS — Z89412 Acquired absence of left great toe: Secondary | ICD-10-CM | POA: Diagnosis not present

## 2016-05-10 DIAGNOSIS — E440 Moderate protein-calorie malnutrition: Secondary | ICD-10-CM | POA: Insufficient documentation

## 2016-05-10 DIAGNOSIS — K219 Gastro-esophageal reflux disease without esophagitis: Secondary | ICD-10-CM | POA: Diagnosis not present

## 2016-05-10 DIAGNOSIS — I69354 Hemiplegia and hemiparesis following cerebral infarction affecting left non-dominant side: Secondary | ICD-10-CM | POA: Diagnosis not present

## 2016-05-10 DIAGNOSIS — Z89422 Acquired absence of other left toe(s): Secondary | ICD-10-CM

## 2016-05-10 DIAGNOSIS — Z7984 Long term (current) use of oral hypoglycemic drugs: Secondary | ICD-10-CM

## 2016-05-10 DIAGNOSIS — I2510 Atherosclerotic heart disease of native coronary artery without angina pectoris: Secondary | ICD-10-CM | POA: Diagnosis not present

## 2016-05-10 DIAGNOSIS — E1151 Type 2 diabetes mellitus with diabetic peripheral angiopathy without gangrene: Secondary | ICD-10-CM | POA: Diagnosis present

## 2016-05-10 DIAGNOSIS — Z7901 Long term (current) use of anticoagulants: Secondary | ICD-10-CM

## 2016-05-10 DIAGNOSIS — I10 Essential (primary) hypertension: Secondary | ICD-10-CM | POA: Diagnosis present

## 2016-05-10 DIAGNOSIS — J441 Chronic obstructive pulmonary disease with (acute) exacerbation: Secondary | ICD-10-CM | POA: Diagnosis not present

## 2016-05-10 DIAGNOSIS — H3530 Unspecified macular degeneration: Secondary | ICD-10-CM | POA: Diagnosis not present

## 2016-05-10 DIAGNOSIS — N179 Acute kidney failure, unspecified: Secondary | ICD-10-CM | POA: Diagnosis not present

## 2016-05-10 DIAGNOSIS — Z87891 Personal history of nicotine dependence: Secondary | ICD-10-CM

## 2016-05-10 DIAGNOSIS — J9601 Acute respiratory failure with hypoxia: Secondary | ICD-10-CM | POA: Diagnosis present

## 2016-05-10 DIAGNOSIS — Z6821 Body mass index (BMI) 21.0-21.9, adult: Secondary | ICD-10-CM

## 2016-05-10 DIAGNOSIS — I4891 Unspecified atrial fibrillation: Secondary | ICD-10-CM | POA: Diagnosis present

## 2016-05-10 DIAGNOSIS — R0602 Shortness of breath: Secondary | ICD-10-CM | POA: Diagnosis not present

## 2016-05-10 DIAGNOSIS — J189 Pneumonia, unspecified organism: Secondary | ICD-10-CM | POA: Diagnosis not present

## 2016-05-10 DIAGNOSIS — Z95820 Peripheral vascular angioplasty status with implants and grafts: Secondary | ICD-10-CM | POA: Diagnosis not present

## 2016-05-10 DIAGNOSIS — I482 Chronic atrial fibrillation: Secondary | ICD-10-CM | POA: Diagnosis not present

## 2016-05-10 DIAGNOSIS — Z79899 Other long term (current) drug therapy: Secondary | ICD-10-CM

## 2016-05-10 DIAGNOSIS — J439 Emphysema, unspecified: Secondary | ICD-10-CM | POA: Diagnosis not present

## 2016-05-10 DIAGNOSIS — E860 Dehydration: Secondary | ICD-10-CM | POA: Diagnosis present

## 2016-05-10 DIAGNOSIS — E43 Unspecified severe protein-calorie malnutrition: Secondary | ICD-10-CM | POA: Diagnosis not present

## 2016-05-10 DIAGNOSIS — I739 Peripheral vascular disease, unspecified: Secondary | ICD-10-CM | POA: Diagnosis present

## 2016-05-10 DIAGNOSIS — Z8673 Personal history of transient ischemic attack (TIA), and cerebral infarction without residual deficits: Secondary | ICD-10-CM

## 2016-05-10 DIAGNOSIS — M7989 Other specified soft tissue disorders: Secondary | ICD-10-CM | POA: Diagnosis not present

## 2016-05-10 DIAGNOSIS — J449 Chronic obstructive pulmonary disease, unspecified: Secondary | ICD-10-CM | POA: Diagnosis present

## 2016-05-10 LAB — HEPATIC FUNCTION PANEL
ALT: 9 U/L — AB (ref 17–63)
AST: 17 U/L (ref 15–41)
Albumin: 2.7 g/dL — ABNORMAL LOW (ref 3.5–5.0)
Alkaline Phosphatase: 55 U/L (ref 38–126)
Bilirubin, Direct: 0.2 mg/dL (ref 0.1–0.5)
Indirect Bilirubin: 0.4 mg/dL (ref 0.3–0.9)
TOTAL PROTEIN: 6 g/dL — AB (ref 6.5–8.1)
Total Bilirubin: 0.6 mg/dL (ref 0.3–1.2)

## 2016-05-10 LAB — BASIC METABOLIC PANEL
ANION GAP: 7 (ref 5–15)
BUN: 36 mg/dL — ABNORMAL HIGH (ref 6–20)
CHLORIDE: 99 mmol/L — AB (ref 101–111)
CO2: 31 mmol/L (ref 22–32)
Calcium: 9.2 mg/dL (ref 8.9–10.3)
Creatinine, Ser: 1.25 mg/dL — ABNORMAL HIGH (ref 0.61–1.24)
GFR calc Af Amer: 58 mL/min — ABNORMAL LOW (ref >60)
GFR calc non Af Amer: 50 mL/min — ABNORMAL LOW (ref >60)
GLUCOSE: 106 mg/dL — AB (ref 65–99)
POTASSIUM: 4.5 mmol/L (ref 3.5–5.1)
Sodium: 137 mmol/L (ref 135–145)

## 2016-05-10 LAB — CBC
HEMATOCRIT: 36.8 % — AB (ref 39.0–52.0)
HEMOGLOBIN: 11.5 g/dL — AB (ref 13.0–17.0)
MCH: 32.9 pg (ref 26.0–34.0)
MCHC: 31.3 g/dL (ref 30.0–36.0)
MCV: 105.1 fL — ABNORMAL HIGH (ref 78.0–100.0)
Platelets: 212 10*3/uL (ref 150–400)
RBC: 3.5 MIL/uL — ABNORMAL LOW (ref 4.22–5.81)
RDW: 13.5 % (ref 11.5–15.5)
WBC: 5 10*3/uL (ref 4.0–10.5)

## 2016-05-10 LAB — GLUCOSE, CAPILLARY
Glucose-Capillary: 156 mg/dL — ABNORMAL HIGH (ref 65–99)
Glucose-Capillary: 76 mg/dL (ref 65–99)

## 2016-05-10 LAB — MAGNESIUM: Magnesium: 1.7 mg/dL (ref 1.7–2.4)

## 2016-05-10 LAB — BRAIN NATRIURETIC PEPTIDE: B Natriuretic Peptide: 135.3 pg/mL — ABNORMAL HIGH (ref 0.0–100.0)

## 2016-05-10 LAB — TROPONIN I: Troponin I: <0.03 ng/mL (ref <0.03)

## 2016-05-10 LAB — STREP PNEUMONIAE URINARY ANTIGEN: STREP PNEUMO URINARY ANTIGEN: NEGATIVE

## 2016-05-10 LAB — PHOSPHORUS: PHOSPHORUS: 3.6 mg/dL (ref 2.5–4.6)

## 2016-05-10 MED ORDER — METHYLPREDNISOLONE SODIUM SUCC 125 MG IJ SOLR
60.0000 mg | Freq: Two times a day (BID) | INTRAMUSCULAR | Status: DC
  Administered 2016-05-10 – 2016-05-11 (×2): 60 mg via INTRAVENOUS
  Filled 2016-05-10 (×2): qty 2

## 2016-05-10 MED ORDER — CARVEDILOL 6.25 MG PO TABS
6.2500 mg | ORAL_TABLET | Freq: Two times a day (BID) | ORAL | Status: DC
  Administered 2016-05-11 (×2): 6.25 mg via ORAL
  Filled 2016-05-10 (×2): qty 1

## 2016-05-10 MED ORDER — MIRTAZAPINE 15 MG PO TABS
15.0000 mg | ORAL_TABLET | Freq: Every day | ORAL | Status: DC
  Administered 2016-05-10 – 2016-05-11 (×2): 15 mg via ORAL
  Filled 2016-05-10 (×2): qty 1

## 2016-05-10 MED ORDER — METHYLPREDNISOLONE SODIUM SUCC 125 MG IJ SOLR: 60.0000 mg | Freq: Four times a day (QID) | INTRAMUSCULAR | Status: DC

## 2016-05-10 MED ORDER — SODIUM CHLORIDE 0.9 % IV BOLUS (SEPSIS)
1000.0000 mL | Freq: Once | INTRAVENOUS | Status: AC
  Administered 2016-05-10: 1000 mL via INTRAVENOUS

## 2016-05-10 MED ORDER — INSULIN ASPART 100 UNIT/ML ~~LOC~~ SOLN
0.0000 [IU] | Freq: Three times a day (TID) | SUBCUTANEOUS | Status: DC
  Administered 2016-05-11 (×2): 3 [IU] via SUBCUTANEOUS
  Administered 2016-05-11: 2 [IU] via SUBCUTANEOUS

## 2016-05-10 MED ORDER — DEXTROSE 5 % IV SOLN
500.0000 mg | INTRAVENOUS | Status: DC
  Administered 2016-05-10: 500 mg via INTRAVENOUS
  Filled 2016-05-10 (×2): qty 500

## 2016-05-10 MED ORDER — APIXABAN 5 MG PO TABS
5.0000 mg | ORAL_TABLET | Freq: Two times a day (BID) | ORAL | Status: DC
  Administered 2016-05-10 – 2016-05-12 (×4): 5 mg via ORAL
  Filled 2016-05-10 (×4): qty 1

## 2016-05-10 MED ORDER — PREDNISONE 20 MG PO TABS
60.0000 mg | ORAL_TABLET | Freq: Once | ORAL | Status: AC
Start: 2016-05-10 — End: 2016-05-10
  Administered 2016-05-10: 60 mg via ORAL
  Filled 2016-05-10: qty 3

## 2016-05-10 MED ORDER — INSULIN ASPART 100 UNIT/ML ~~LOC~~ SOLN
0.0000 [IU] | Freq: Every day | SUBCUTANEOUS | Status: DC
  Administered 2016-05-11: 2 [IU] via SUBCUTANEOUS

## 2016-05-10 MED ORDER — MONTELUKAST SODIUM 10 MG PO TABS
10.0000 mg | ORAL_TABLET | Freq: Every day | ORAL | Status: DC
  Administered 2016-05-10 – 2016-05-11 (×2): 10 mg via ORAL
  Filled 2016-05-10 (×2): qty 1

## 2016-05-10 MED ORDER — IPRATROPIUM-ALBUTEROL 0.5-2.5 (3) MG/3ML IN SOLN
3.0000 mL | Freq: Three times a day (TID) | RESPIRATORY_TRACT | Status: DC
  Administered 2016-05-11 – 2016-05-12 (×4): 3 mL via RESPIRATORY_TRACT
  Filled 2016-05-10 (×4): qty 3

## 2016-05-10 MED ORDER — DEXTROSE 5 % IV SOLN
1.0000 g | Freq: Once | INTRAVENOUS | Status: AC
  Administered 2016-05-10: 1 g via INTRAVENOUS
  Filled 2016-05-10: qty 10

## 2016-05-10 MED ORDER — SIMVASTATIN 40 MG PO TABS
40.0000 mg | ORAL_TABLET | Freq: Every evening | ORAL | Status: DC
  Administered 2016-05-10 – 2016-05-11 (×2): 40 mg via ORAL
  Filled 2016-05-10 (×2): qty 1

## 2016-05-10 MED ORDER — PANTOPRAZOLE SODIUM 40 MG PO TBEC
40.0000 mg | DELAYED_RELEASE_TABLET | Freq: Two times a day (BID) | ORAL | Status: DC
  Administered 2016-05-10 – 2016-05-12 (×4): 40 mg via ORAL
  Filled 2016-05-10 (×4): qty 1

## 2016-05-10 MED ORDER — ACETAMINOPHEN 650 MG RE SUPP: 650.0000 mg | Freq: Four times a day (QID) | RECTAL | Status: DC | PRN

## 2016-05-10 MED ORDER — SODIUM CHLORIDE 0.9 % IV SOLN
INTRAVENOUS | Status: DC
  Administered 2016-05-10: 17:00:00 via INTRAVENOUS

## 2016-05-10 MED ORDER — IPRATROPIUM-ALBUTEROL 0.5-2.5 (3) MG/3ML IN SOLN
3.0000 mL | RESPIRATORY_TRACT | Status: DC
  Administered 2016-05-10 (×2): 3 mL via RESPIRATORY_TRACT
  Filled 2016-05-10 (×2): qty 3

## 2016-05-10 MED ORDER — TIOTROPIUM BROMIDE MONOHYDRATE 18 MCG IN CAPS
18.0000 ug | ORAL_CAPSULE | Freq: Every day | RESPIRATORY_TRACT | Status: DC
  Administered 2016-05-11 – 2016-05-12 (×2): 18 ug via RESPIRATORY_TRACT
  Filled 2016-05-10: qty 5

## 2016-05-10 MED ORDER — ACETAMINOPHEN 325 MG PO TABS: 650.0000 mg | ORAL_TABLET | Freq: Four times a day (QID) | ORAL | Status: DC | PRN

## 2016-05-10 MED ORDER — MAGNESIUM OXIDE 400 (241.3 MG) MG PO TABS
200.0000 mg | ORAL_TABLET | Freq: Every day | ORAL | Status: DC
  Administered 2016-05-10 – 2016-05-12 (×3): 200 mg via ORAL
  Filled 2016-05-10 (×3): qty 1

## 2016-05-10 NOTE — H&P (Signed)
History and Physical    [name] [id number] DOB: 12/31/1927 DOA: 05/10/2016   PCP: [name], MD   Patient coming from/Resides with: Private residence/lives with wife  Chief Complaint: Hypoxemia and failed outpatient treatment for community-acquired pneumonia  HPI: [name] is a 82 y.o. male with medical history significant for remote CVA with residual left hemiparesis, COPD not on chronic oxygen, atrial fibrillation on eliquis, hypertension, peripheral vascular disease with recent toe amputation left foot, dyslipidemia, and documented protein calorie malnutrition. According to the wife who was assisting with the history the patient had been having progressive respiratory symptoms with shortness of breath and cough for at least one week as of last Tuesday. He was evaluated by his pulmonologist and was started on Levaquin. His symptoms had not improved and he returned to the pulmonary office on 8/16 and was found to be hypoxemic and was started on nasal cannula oxygen. Unfortunately since that time he is still not improved and was sent to the ER for further evaluation. Patient has been having some audible wheezing according to the wife as well as a wet sounding productive cough with white to yellow sputum occasionally copious amounts. His had chills. His weight has decreased at least 2-3 pounds since onset of symptoms. 2 weeks ago patient had an outpatient amputation of a toe on the left foot and since that time has had increased right lower extremity swelling.  ED Course:  Vital signs: 98.5-134/77-78-21-2 L 100% Two-view chest x-ray: Streaky densities at both lung bases left side greater than right likely consistent with pneumonia process Lab data: Sodium 137, potassium 4.5, chloride 99, CO2 31, BUN 36, creatinine 1.25, glucose 107, anion gap 7, troponin less than 0.03, WBC 5000 differential obtained, hemoglobin 9.5, MCV 105, platelets 212,000 Medications and treatments:  None  Review of Systems:  In addition to the HPI above,  No Fever, myalgias or other constitutional symptoms No Headache, changes with Vision or hearing, new weakness, tingling, numbness in any extremity, No problems swallowing food or Liquids, indigestion/reflux No Chest pain, palpitations No Abdominal pain, N/V; no melena or hematochezia, no dark tarry stools No dysuria, hematuria or flank pain No new skin rashes, lesions, masses or bruises, No new joints pains-aches No recent weight gain  No polyuria, polydypsia or polyphagia,   Past Medical History:  Diagnosis Date  . Anemia   . Arthritis    "legs" (02/24/2014)  . Cellulitis and abscess of toe 08/2015   LEFT FOOT   . Claudication of calf muscles ([place]) 11/05/2012   right calf  . Constipation   . COPD (chronic obstructive pulmonary disease) ([place])   . Coronary artery disease   . Dysrhythmia    atrial fibrilation  . GERD (gastroesophageal reflux disease)   . Hypertension   . Macular degeneration, bilateral    "has had shots in both eyes"  . Neuromuscular disorder ([facility])    dizziness  . On home oxygen therapy    "2L; w/activity" (02/24/2014)  . Peripheral vascular disease ([place])   . Pneumonia    "5 times back to back recently (just finished antibiiotic) " (02/24/2014)  . Shortness of breath    with exertion  . Stroke ([place]) 05/2013   "can't use left arm fully since"  . TIA (transient ischemic attack) 2010  . Type II diabetes mellitus ([place])     Past Surgical History:  Procedure Laterality Date  . AMPUTATION Left 09/12/2015   Procedure: LEFT GREAT TOE AMPUTATION;  Surgeon: Tonna Corner  Marlou Sa, MD;  Location: [place];  Service: Orthopedics;  Laterality: Left;  . AMPUTATION Left 04/17/2016   Procedure: 2nd Toe Amputation Left Foot;  Surgeon: [name], MD;  Location: [place];  Service: Orthopedics;  Laterality: Left;  . CARDIAC CATHETERIZATION    . CHEST TUBE INSERTION Left 02/24/2014   Procedure: INSERTION PLEURAL DRAINAGE  CATHETER;  Surgeon: Ivin Poot, MD;  Location: [place];  Service: Thoracic;  Laterality: Left;  . COLONOSCOPY    . ESOPHAGOGASTRODUODENOSCOPY N/A 11/02/2013   Procedure: ESOPHAGOGASTRODUODENOSCOPY (EGD);  Surgeon: Cleotis Nipper, MD;  Location: [facility] ENDOSCOPY;  Service: Endoscopy;  Laterality: N/A;  . ESOPHAGOGASTRODUODENOSCOPY (EGD) WITH PROPOFOL N/A 07/06/2014   Procedure: ESOPHAGOGASTRODUODENOSCOPY (EGD) WITH PROPOFOL;  Surgeon: Cleotis Nipper, MD;  Location: [place];  Service: Endoscopy;  Laterality: N/A;  . FEMORAL ARTERY STENT Left 10/2012  . FOOT SURGERY Left    due to broken foot years ago  . LOWER EXTREMITY ANGIOGRAM N/A 11/06/2012   Procedure: LOWER EXTREMITY ANGIOGRAM;  Surgeon: Laverda Page, MD;  Location: [facility] CATH LAB;  Service: Cardiovascular;  Laterality: N/A;  . PERIPHERAL VASCULAR CATHETERIZATION N/A 11/20/2015   Procedure: Lower Extremity Angiography;  Surgeon: Adrian Prows, MD;  Location: [place] CV LAB;  Service: Cardiovascular;  Laterality: N/A;  . PERIPHERAL VASCULAR CATHETERIZATION  11/20/2015   Procedure: Peripheral Vascular Atherectomy;  Surgeon: Adrian Prows, MD;  Location: [place] CV LAB;  Service: Cardiovascular;;  . REMOVAL OF PLEURAL DRAINAGE CATHETER Left 05/03/2014   Procedure: REMOVAL OF PLEURAL DRAINAGE CATHETER;  Surgeon: Ivin Poot, MD;  Location: [place];  Service: Thoracic;  Laterality: Left;  . TRANSURETHRAL RESECTION OF PROSTATE    . VASCULAR SURGERY     stents to legs    Social History   Social History  . Marital status: Married    Spouse name: N/A  . Number of children: N/A  . Years of education: N/A   Occupational History  . Not on file.   Social History Main Topics  . Smoking status: Former Smoker    Packs/day: 3.00    Years: 35.00    Types: Cigarettes    Quit date: 09/23/1981  . Smokeless tobacco: Never Used  . Alcohol use Yes     Comment: QUITS YEARS AGO  . Drug use: No  . Sexual activity: No   Other Topics Concern   . Not on file   Social History Narrative   ** Merged History Encounter **        Mobility: Typically without assistive devices Work history: Retired   Allergies  Allergen Reactions  . No Known Allergies     Family History  Problem Relation Age of Onset  . Hypertension Other   . Cancer Neg Hx   . CAD Neg Hx      Prior to Admission medications   Medication Sig Start Date End Date Taking? Authorizing Provider  acetaminophen (TYLENOL) 325 MG tablet Take 2 tablets (650 mg total) by mouth every 4 (four) hours as needed for mild pain. 11/21/15  Yes Neldon Labella, NP  albuterol (PROVENTIL) (2.5 MG/3ML) 0.083% nebulizer solution Take 2.5 mg by nebulization every 6 (six) hours as needed for wheezing or shortness of breath.    Yes Historical Provider, MD  allopurinol (ZYLOPRIM) 100 MG tablet Take 100 mg by mouth 2 (two) times daily as needed (for gout flares).  06/28/14  Yes Historical Provider, MD  carvedilol (COREG) 6.25 MG tablet Take 6.25 mg by mouth 2 (  two) times daily with a meal.   Yes Historical Provider, MD  colchicine 0.6 MG tablet Take 1 tablet (0.6 mg total) by mouth 2 (two) times daily. Patient taking differently: Take 0.6 mg by mouth 2 (two) times daily as needed (for gout flares).  04/21/14  Yes Junius Creamer, NP  ELIQUIS 5 MG TABS tablet Take 5 mg by mouth 2 (two) times daily.  04/01/16  Yes Historical Provider, MD  furosemide (LASIX) 20 MG tablet Take 20 mg by mouth every morning. 04/26/16  Yes Historical Provider, MD  levofloxacin (LEVAQUIN) 500 MG tablet Take 1 tablet (500 mg total) by mouth daily. Patient taking differently: Take 500 mg by mouth daily. To last 6 days 05/08/16 05/14/17 Yes Brand Males, MD  magnesium oxide (MAG-OX) 400 MG tablet Take 200 mg by mouth daily.   Yes Historical Provider, MD  Melatonin 3 MG TABS Take 3 mg by mouth at bedtime as needed (sleep).    Yes Historical Provider, MD  metFORMIN (GLUCOPHAGE) 1000 MG tablet Take 0.5 tablets (500 mg total)  by mouth 2 (two) times daily with a meal. 11/22/15  Yes Adrian Prows, MD  mirtazapine (REMERON) 15 MG tablet Take 15 mg by mouth at bedtime.   Yes Historical Provider, MD  Multiple Vitamins-Minerals (OCUVITE PRESERVISION PO) Take 1 tablet by mouth daily.   Yes Historical Provider, MD  oxyCODONE-acetaminophen (PERCOCET/ROXICET) 5-325 MG tablet Take 1 tablet by mouth every 6 (six) hours as needed for pain. 05/02/16  Yes Historical Provider, MD  pantoprazole (PROTONIX) 40 MG tablet Take 1 tablet (40 mg total) by mouth 2 (two) times daily. 07/22/14  Yes Charlynne Cousins, MD  simvastatin (ZOCOR) 40 MG tablet Take 40 mg by mouth every evening.   Yes Historical Provider, MD  tiotropium (SPIRIVA) 18 MCG inhalation capsule Place 18 mcg into inhaler and inhale daily.   Yes Historical Provider, MD  traMADol (ULTRAM) 50 MG tablet Take 50 mg by mouth every 8 (eight) hours as needed for pain. 04/19/16  Yes Historical Provider, MD  vitamin B-12 (CYANOCOBALAMIN) 1000 MCG tablet Take 1,000 mcg by mouth daily.   Yes Historical Provider, MD    Physical Exam: Vitals:   05/10/16 1615 05/10/16 1645 05/10/16 1700 05/10/16 1730  BP: (!) 120/36 140/80 (!) 119/51 113/74  Pulse:  76 72 69  Resp:  20 16 18   Temp:      TempSrc:      SpO2: 93% 100% 100% 100%      Constitutional: NAD, calm, comfortable-Overall cachectic appearance Eyes: PERRL, lids and conjunctivae normal ENMT: Mucous membranes are dry. Posterior pharynx clear of any exudate or lesions. Neck: normal, supple, no masses, no thyromegaly Respiratory: Diminished throughout with bibasilar crackles posteriorly. Normal respiratory effort. No accessory muscle use. 2 L nasal cannula Cardiovascular: Irregular rate and with atrial fibrillation rhythm, no murmurs / rubs / gallops. Focal nonpitting left lower extremity edema. 2+ pedal pulses. No carotid bruits.  Abdomen: no tenderness, no masses palpated. No hepatosplenomegaly. Bowel sounds positive.   Musculoskeletal: no clubbing / cyanosis. No joint deformity upper and lower extremities. Good ROM, no contractures. Normal muscle tone. Offloading boot to left foot with dressings intact Skin: no rashes, lesions, ulcers. No induration Neurologic: CN 2-12 grossly intact. Sensation intact, DTR normal. Strength 4-5/5 on the right with purposeful movement: A she has residual hemiparesis with retained sensation left side with notable contracture of left hand at rest Psychiatric: Normal judgment and insight. Alert and oriented x 3. Normal mood.  Labs on Admission: I have personally reviewed following labs and imaging studies  CBC:  Recent Labs Lab 05/10/16 1417  WBC 5.0  HGB 11.5*  HCT 36.8*  MCV 105.1*  PLT 99991111   Basic Metabolic Panel:  Recent Labs Lab 05/10/16 1417  NA 137  K 4.5  CL 99*  CO2 31  GLUCOSE 106*  BUN 36*  CREATININE 1.25*  CALCIUM 9.2   GFR: CrCl cannot be calculated (Unknown ideal weight.). Liver Function Tests: No results for input(s): AST, ALT, ALKPHOS, BILITOT, PROT, ALBUMIN in the last 168 hours. No results for input(s): LIPASE, AMYLASE in the last 168 hours. No results for input(s): AMMONIA in the last 168 hours. Coagulation Profile: No results for input(s): INR, PROTIME in the last 168 hours. Cardiac Enzymes:  Recent Labs Lab 05/10/16 1417  TROPONINI <0.03   BNP (last 3 results) No results for input(s): PROBNP in the last 8760 hours. HbA1C: No results for input(s): HGBA1C in the last 72 hours. CBG: No results for input(s): GLUCAP in the last 168 hours. Lipid Profile: No results for input(s): CHOL, HDL, LDLCALC, TRIG, CHOLHDL, LDLDIRECT in the last 72 hours. Thyroid Function Tests: No results for input(s): TSH, T4TOTAL, FREET4, T3FREE, THYROIDAB in the last 72 hours. Anemia Panel: No results for input(s): VITAMINB12, FOLATE, FERRITIN, TIBC, IRON, RETICCTPCT in the last 72 hours. Urine analysis:    Component Value Date/Time    COLORURINE YELLOW 07/02/2014 [place] 07/02/2014 1141   LABSPEC 1.015 07/02/2014 1141   PHURINE 5.0 07/02/2014 1141   GLUCOSEU NEGATIVE 07/02/2014 1141   HGBUR NEGATIVE 07/02/2014 1141   BILIRUBINUR MODERATE (A) 07/02/2014 1141   KETONESUR 15 (A) 07/02/2014 1141   PROTEINUR 30 (A) 07/02/2014 1141   UROBILINOGEN 0.2 07/02/2014 1141   NITRITE NEGATIVE 07/02/2014 1141   LEUKOCYTESUR NEGATIVE 07/02/2014 1141   Sepsis Labs: @LABRCNTIP (procalcitonin:4,lacticidven:4) )No results found for this or any previous visit (from the past 240 hour(s)).   Radiological Exams on Admission: Dg Chest 2 View  Result Date: 05/10/2016 CLINICAL DATA:  Shortness of breath and weakness. Left posterior rib pain. EXAM: CHEST  2 VIEW COMPARISON:  05/08/2016 FINDINGS: Again noted is hyperinflation lungs with streaky densities in the left lower chest. There is a chronic calcified granuloma at the right lung base. Few linear densities at the right lung base. Upper lungs are clear. Heart and mediastinum are stable. Atherosclerotic calcifications involving the thoracic aorta. No large pleural effusions. No acute bone abnormality. Old left lower rib fractures. IMPRESSION: Streaky densities at both lung bases, left side greater than right. Minimal change from the recent examination. Findings could represent areas of atelectasis but cannot exclude infection at the left lung base. Aortic atherosclerosis. Electronically Signed   By: Markus Daft M.D.   On: 05/10/2016 16:08    EKG: (Independently reviewed) Atrial fibrillation with ventricular rate 85 bpm, QTC 430 ms, rare unifocal PVC, no ischemic changes  Assessment/Plan Principal Problem:   Acute respiratory failure with hypoxia/ CAP (community acquired pneumonia) -Patient presents with her then 1 week progressive respiratory symptoms of cough and dyspnea and hypoxemia with failed response to outpatient oral Levaquin -Because of need for IV antibiotics and  concomitant acute comorbidity of acute kidney injury and significant dehydration on chronic medical problems of atrial fibrillation prior stroke COPD and severe protein calorie malnutrition patient will require at least to midnight stay therefore has been made inpatient -Empiric antibiotics with Rocephin and Zithromax -Supportive care with oxygen and incentive spirometry -Follow up  on blood cultures and sputum culture -Urinary legionella and strep antigens -Pneumonia focused order set initiated  Active Problems:   COPD (chronic obstructive pulmonary disease)  -Not actively wheezing -Given prednisone 60 in the ER and we will utilize Solu-Medrol 60 mg IV every 6 hours and hopefully can rapidly taper and eventually transitioned back to prednisone -Continue nebulizers on schedule -Continue preadmission for [place]    Acute kidney injury  -Baseline renal function: 35/0.82 -Current renal function 36/1.25 -Gentle IV fluid hydration -Hold Lasix    Diabetes mellitus -Hold metformin -Follow CBGs and provide SSI -Hemoglobin A1c    HTN (hypertension) -Continue carvedilol    Atrial fibrillation  -Continue eliquis and carvedilol -CHADVASc = 6 -Currently rate controlled    Protein-calorie malnutrition, severe  -Obtain weight and calculate BMI; nutrition consultation    Toe infection s/p amputation/  PAD (peripheral artery disease)  -Wife reports patient underwent outpatient to amputation by Dr.Duda to 2 weeks ago -Continue dry dressings and offloading boot as prior to admission -New edema left lower extremity postoperatively several completeness of evaluation check lower extremity duplex to rule out DVT -PT evaluation    HLD (hyperlipidemia) -Continue Zocor    History of CVA (cerebrovascular accident) -Continue Zocor -Not on aspirin secondary to need for full dose anticoagulation with eliquis      DVT prophylaxis: Eliquis Code Status: Full Family Communication: Wife at  bedside Disposition Plan: Anticipate discharge back to preadmission home environment pending PT evaluation Consults called: None Admission status: Inpatient/ medical floor    Curtis Bresnan L. ANP-BC [facility] Pager [phone]   If 7PM-7AM, please contact night-coverage [email or web address] Password [facility]  05/10/2016, 5:38 PM

## 2016-05-10 NOTE — Telephone Encounter (Signed)
- >   There is likely pneumonia left base  plan - finish antibiotics - repeat cxr 2 view in 4 weeks -> see me or APP   Dr. Brand Males, M.D., [facility].C.P Pulmonary and Critical Care Medicine Staff Physician [facility] Pager: [phone], If no answer or between  15:00h - 7:00h: call [phone]  05/10/2016 12:54 AM        Dg Chest 2 View  Result Date: 05/09/2016 CLINICAL DATA:  Productive cough and shortness of breath for several days EXAM: CHEST  2 VIEW COMPARISON:  02/15/2016 FINDINGS: Cardiac shadow is stable. Changes of prior granulomatous disease are again noted. Blunting of the costophrenic angles is noted bilaterally related to hyperinflation stable from the prior study. Mild increased density is noted in the left lung base which may represent focal infiltrate. IMPRESSION: Mild changes in the left lung base which may represent a combination of atelectasis and infiltrate. Followup PA and lateral chest X-ray is recommended in 3-4 weeks following trial of antibiotic therapy to ensure resolution and exclude underlying malignancy. Electronically Signed   By: Inez Catalina M.D.   On: 05/09/2016 08:11

## 2016-05-10 NOTE — ED Notes (Signed)
Phlebotomy aware RN unable to obtain blood work

## 2016-05-10 NOTE — Progress Notes (Signed)
New pt admission from ED. Pt brought to the floor in stable condition. Vitals taken. Initial Assessment done. All immediate pertinent needs to patient addressed. Patient Guide given to patient. Important safety instructions relating to hospitalization reviewed with patient. Patient verbalized understanding. Pt high fall risk. Bed alarm on. Will continue to monitor pt.  Maurene Capes RN

## 2016-05-10 NOTE — Telephone Encounter (Signed)
Ok thanks. Just ensure fu

## 2016-05-10 NOTE — Telephone Encounter (Signed)
Called and spoke with pts daughter and she stated that she ended up taking the pt to the ER for eval since he seemed worse this morning and has not been eating well.  She stated that her mother did not tell her that our office called with the results of cxr until after they had taken him to the ER.  Will forward to MR to make him aware.

## 2016-05-10 NOTE — Telephone Encounter (Signed)
Pt daughter returning call and can be reached @ [phone].Hillery Hunter

## 2016-05-10 NOTE — ED Provider Notes (Signed)
[place] DEPT Provider Note   CSN: [id number] Arrival date & time: 05/10/16  1400     History   Chief Complaint Chief Complaint  Patient presents with  . Shortness of Breath    HPI [name] is a 82 y.o. male.  82 year old male with community acquired pneumonia that did not improve with three days of Levaquin. He's had worsening cough, shortness of breath, weakness, decreased urine output and appetite over the last few days.     Cough  This is a new problem. The current episode started more than 2 days ago. The problem occurs constantly. The problem has been gradually worsening. The cough is productive of sputum. There has been no fever. Associated symptoms include chills, shortness of breath and wheezing. Pertinent negatives include no chest pain. Treatments tried: antibiotics. The treatment provided no relief. He is not a smoker. His past medical history is significant for pneumonia and COPD.    Past Medical History:  Diagnosis Date  . Anemia   . Arthritis    "legs" (02/24/2014)  . Cellulitis and abscess of toe 08/2015   LEFT FOOT   . Claudication of calf muscles ([place]) 11/05/2012   right calf  . Constipation   . COPD (chronic obstructive pulmonary disease) ([place])   . Coronary artery disease   . Dysrhythmia    atrial fibrilation  . GERD (gastroesophageal reflux disease)   . Hypertension   . Macular degeneration, bilateral    "has had shots in both eyes"  . Neuromuscular disorder (HCC)    dizziness  . On home oxygen therapy    "2L; w/activity" (02/24/2014)  . Peripheral vascular disease ([place])   . Pneumonia    "5 times back to back recently (just finished antibiiotic) " (02/24/2014)  . Shortness of breath    with exertion  . Stroke ([place]) 05/2013   "can't use left arm fully since"  . TIA (transient ischemic attack) 2010  . Type II diabetes mellitus [facility])     Patient Active Problem List   Diagnosis Date Noted  . Critical lower limb ischemia 11/19/2015    . Toe infection 09/13/2015  . Cellulitis 09/11/2015  . Cellulitis of toe of left foot 09/11/2015  . Hypoxia 09/11/2015  . Acute respiratory failure with hypoxia ([place]) 09/11/2015  . Pulmonary embolism on right ([place]) 06/23/2015  . Excessive cerumen in left ear canal 07/28/2014  . Acute confusional state 07/25/2014  . Hypoglycemia 07/25/2014  . Gastric dysmotility 07/20/2014  . Acute kidney injury ([place]) 07/05/2014  . Hypernatremia 07/05/2014  . Pneumatosis intestinalis 07/03/2014  . Hypokalemia 07/02/2014  . Dehydration 07/02/2014  . Gastroenteritis 07/02/2014  . Pleural effusion on left 02/24/2014  . Protein-calorie malnutrition, severe ([place]) 01/03/2014  . UTI (lower urinary tract infection) 12/31/2013  . HCAP (healthcare-associated pneumonia) 12/31/2013  . Aspiration pneumonia ([place]) 12/31/2013  . Acute blood loss anemia 12/31/2013  . Pleural effusion, left 12/21/2013  . Dysphagia, unspecified(787.20) 12/21/2013  . Pneumonia 10/31/2013  . CAP (community acquired pneumonia) 10/31/2013  . TIA (transient ischemic attack) 07/06/2013  . PAD (peripheral artery disease) ([place]) 07/06/2013  . HTN (hypertension) 07/06/2013  . Diabetes mellitus, type 2 ([place]) 07/06/2013  . HLD (hyperlipidemia) 07/06/2013  . COPD (chronic obstructive pulmonary disease) ([place]) 07/06/2013  . Atrial fibrillation ([place]) 07/06/2013  . History of GI bleed 07/06/2013  . Cardiomegaly 07/06/2013  . CVA (cerebral infarction) 07/06/2013    Past Surgical History:  Procedure Laterality Date  . AMPUTATION Left 09/12/2015   Procedure: LEFT  GREAT TOE AMPUTATION;  Surgeon: Meredith Pel, MD;  Location: [place];  Service: Orthopedics;  Laterality: Left;  . AMPUTATION Left 04/17/2016   Procedure: 2nd Toe Amputation Left Foot;  Surgeon: [name], MD;  Location: [place];  Service: Orthopedics;  Laterality: Left;  . CARDIAC CATHETERIZATION    . CHEST TUBE INSERTION Left 02/24/2014   Procedure: INSERTION PLEURAL DRAINAGE  CATHETER;  Surgeon: Ivin Poot, MD;  Location: [place];  Service: Thoracic;  Laterality: Left;  . COLONOSCOPY    . ESOPHAGOGASTRODUODENOSCOPY N/A 11/02/2013   Procedure: ESOPHAGOGASTRODUODENOSCOPY (EGD);  Surgeon: Cleotis Nipper, MD;  [facility]COPY;  Service: Endoscopy;  Laterality: N/A;  . ESOPHAGOGASTRODUODENOSCOPY (EGD) WITH PROPOFOL N/A 07/06/2014   Procedure: ESOPHAGOGASTRODUODENOSCOPY (EGD) WITH PROPOFOL;  Surgeon: Cleotis Nipper, MD;  Location: [facility];  Service: Endoscopy;  Laterality: N/A;  . FEMORAL ARTERY STENT Left 10/2012  . FOOT SURGERY Left    due to broken foot years ago  . LOWER EXTREMITY ANGIOGRAM N/A 11/06/2012   Procedure: LOWER EXTREMITY ANGIOGRAM;  Surgeon: Laverda Page, MD;  Location: [facility] CATH LAB;  Service: Cardiovascular;  Laterality: N/A;  . PERIPHERAL VASCULAR CATHETERIZATION N/A 11/20/2015   Procedure: Lower Extremity Angiography;  Surgeon: Adrian Prows, MD;  Location: [place] CV LAB;  Service: Cardiovascular;  Laterality: N/A;  . PERIPHERAL VASCULAR CATHETERIZATION  11/20/2015   Procedure: Peripheral Vascular Atherectomy;  Surgeon: Adrian Prows, MD;  Location: [place] CV LAB;  Service: Cardiovascular;;  . REMOVAL OF PLEURAL DRAINAGE CATHETER Left 05/03/2014   Procedure: REMOVAL OF PLEURAL DRAINAGE CATHETER;  Surgeon: Ivin Poot, MD;  Location: [place];  Service: Thoracic;  Laterality: Left;  . TRANSURETHRAL RESECTION OF PROSTATE    . VASCULAR SURGERY     stents to legs       Home Medications    Prior to Admission medications   Medication Sig Start Date End Date Taking? Authorizing Provider  acetaminophen (TYLENOL) 325 MG tablet Take 2 tablets (650 mg total) by mouth every 4 (four) hours as needed for mild pain. 11/21/15  Yes Neldon Labella, NP  albuterol (PROVENTIL) (2.5 MG/3ML) 0.083% nebulizer solution Take 2.5 mg by nebulization every 6 (six) hours as needed for wheezing or shortness of breath.    Yes Historical Provider, MD    allopurinol (ZYLOPRIM) 100 MG tablet Take 100 mg by mouth 2 (two) times daily as needed (for gout flares).  06/28/14  Yes Historical Provider, MD  carvedilol (COREG) 6.25 MG tablet Take 6.25 mg by mouth 2 (two) times daily with a meal.   Yes Historical Provider, MD  colchicine 0.6 MG tablet Take 1 tablet (0.6 mg total) by mouth 2 (two) times daily. Patient taking differently: Take 0.6 mg by mouth 2 (two) times daily as needed.  04/21/14  Yes Junius Creamer, NP  ELIQUIS 5 MG TABS tablet Take 5 mg by mouth 2 (two) times daily.  04/01/16  Yes Historical Provider, MD  furosemide (LASIX) 20 MG tablet Take 20 mg by mouth every morning. 04/26/16  Yes Historical Provider, MD  levofloxacin (LEVAQUIN) 500 MG tablet Take 1 tablet (500 mg total) by mouth daily. Patient taking differently: Take 500 mg by mouth daily. To last 6 days 05/08/16 05/14/17 Yes Brand Males, MD  magnesium oxide (MAG-OX) 400 MG tablet Take 200 mg by mouth daily.   Yes Historical Provider, MD  Melatonin 3 MG TABS Take 3 mg by mouth at bedtime as needed (sleep).    Yes Historical Provider,  MD  metFORMIN (GLUCOPHAGE) 1000 MG tablet Take 0.5 tablets (500 mg total) by mouth 2 (two) times daily with a meal. 11/22/15  Yes Adrian Prows, MD  mirtazapine (REMERON) 15 MG tablet Take 15 mg by mouth at bedtime.   Yes Historical Provider, MD  Multiple Vitamins-Minerals (OCUVITE PRESERVISION PO) Take 1 tablet by mouth daily.   Yes Historical Provider, MD  oxyCODONE-acetaminophen (PERCOCET/ROXICET) 5-325 MG tablet Take 1 tablet by mouth every 6 (six) hours as needed for pain. 05/02/16  Yes Historical Provider, MD  pantoprazole (PROTONIX) 40 MG tablet Take 1 tablet (40 mg total) by mouth 2 (two) times daily. 07/22/14  Yes Charlynne Cousins, MD  simvastatin (ZOCOR) 40 MG tablet Take 40 mg by mouth every evening.   Yes Historical Provider, MD  tiotropium (SPIRIVA) 18 MCG inhalation capsule Place 18 mcg into inhaler and inhale daily.   Yes Historical Provider, MD   traMADol (ULTRAM) 50 MG tablet Take 50 mg by mouth every 8 (eight) hours as needed for pain. 04/19/16  Yes Historical Provider, MD  vitamin B-12 (CYANOCOBALAMIN) 1000 MCG tablet Take 1,000 mcg by mouth daily.   Yes Historical Provider, MD    Family History Family History  Problem Relation Age of Onset  . Hypertension Other   . Cancer Neg Hx   . CAD Neg Hx     Social History Social History  Substance Use Topics  . Smoking status: Former Smoker    Packs/day: 3.00    Years: 35.00    Types: Cigarettes    Quit date: 09/23/1981  . Smokeless tobacco: Never Used  . Alcohol use Yes     Comment: QUITS YEARS AGO     Allergies   No known allergies   Review of Systems Review of Systems  Constitutional: Positive for activity change, appetite change, chills and fatigue.  Respiratory: Positive for cough, shortness of breath and wheezing.   Cardiovascular: Negative for chest pain, palpitations and leg swelling.  All other systems reviewed and are negative.    Physical Exam Updated Vital Signs BP 134/77 (BP Location: Right Arm)   Pulse 78   Temp 98.5 F (36.9 C) (Oral)   Resp 21   SpO2 100%   Physical Exam  Constitutional: He appears well-developed and well-nourished.  HENT:  Head: Normocephalic and atraumatic.  Mouth/Throat: Mucous membranes are dry.  Eyes: Conjunctivae are normal.  Neck: Neck supple. No tracheal deviation present.  Cardiovascular: Normal rate.  An irregularly irregular rhythm present. Exam reveals no friction rub.   No murmur heard. Pulmonary/Chest: Accessory muscle usage present. No stridor. Tachypnea noted. No respiratory distress. He has decreased breath sounds. He has wheezes. He has rhonchi.  Abdominal: Soft. He exhibits no mass. There is no tenderness. There is no guarding.  Musculoskeletal: He exhibits no edema.  Neurological: He is alert.  Skin: Skin is warm and dry.  Psychiatric: He has a normal mood and affect.  Nursing note and vitals  reviewed.    ED Treatments / Results  Labs (all labs ordered are listed, but only abnormal results are displayed) Labs Reviewed  BASIC METABOLIC PANEL - Abnormal; Notable for the following:       Result Value   Chloride 99 (*)    Glucose, Bld 106 (*)    BUN 36 (*)    Creatinine, Ser 1.25 (*)    GFR calc non Af Amer 50 (*)    GFR calc Af Amer 58 (*)    All other components within normal  limits  CBC - Abnormal; Notable for the following:    RBC 3.50 (*)    Hemoglobin 11.5 (*)    HCT 36.8 (*)    MCV 105.1 (*)    All other components within normal limits  CULTURE, BLOOD (ROUTINE X 2)  CULTURE, BLOOD (ROUTINE X 2)  CULTURE, EXPECTORATED SPUTUM-ASSESSMENT  GRAM STAIN  TROPONIN I  HEPATIC FUNCTION PANEL  BRAIN NATRIURETIC PEPTIDE  HEMOGLOBIN A1C  HIV ANTIBODY (ROUTINE TESTING)  STREP PNEUMONIAE URINARY ANTIGEN    EKG  EKG Interpretation  Date/Time:  Friday May 10 2016 14:09:19 EDT Ventricular Rate:  85 PR Interval:    QRS Duration: 66 QT Interval:  362 QTC Calculation: 430 R Axis:   91 Text Interpretation:  Atrial fibrillation with premature ventricular or aberrantly conducted complexes Rightward axis Low voltage QRS Cannot rule out Anterior infarct , age undetermined Abnormal ECG Confirmed by [facility] MD, Corene Cornea [phone]) on 05/10/2016 4:03:14 PM       Radiology Dg Chest 2 View  Result Date: 05/10/2016 CLINICAL DATA:  Shortness of breath and weakness. Left posterior rib pain. EXAM: CHEST  2 VIEW COMPARISON:  05/08/2016 FINDINGS: Again noted is hyperinflation lungs with streaky densities in the left lower chest. There is a chronic calcified granuloma at the right lung base. Few linear densities at the right lung base. Upper lungs are clear. Heart and mediastinum are stable. Atherosclerotic calcifications involving the thoracic aorta. No large pleural effusions. No acute bone abnormality. Old left lower rib fractures. IMPRESSION: Streaky densities at both lung bases, left  side greater than right. Minimal change from the recent examination. Findings could represent areas of atelectasis but cannot exclude infection at the left lung base. Aortic atherosclerosis. Electronically Signed   By: Markus Daft M.D.   On: 05/10/2016 16:08   Dg Chest 2 View  Result Date: 05/09/2016 CLINICAL DATA:  Productive cough and shortness of breath for several days EXAM: CHEST  2 VIEW COMPARISON:  02/15/2016 FINDINGS: Cardiac shadow is stable. Changes of prior granulomatous disease are again noted. Blunting of the costophrenic angles is noted bilaterally related to hyperinflation stable from the prior study. Mild increased density is noted in the left lung base which may represent focal infiltrate. IMPRESSION: Mild changes in the left lung base which may represent a combination of atelectasis and infiltrate. Followup PA and lateral chest X-ray is recommended in 3-4 weeks following trial of antibiotic therapy to ensure resolution and exclude underlying malignancy. Electronically Signed   By: Inez Catalina M.D.   On: 05/09/2016 08:11    Procedures Procedures (including critical care time)  Medications Ordered in ED Medications  ipratropium-albuterol (DUONEB) 0.5-2.5 (3) MG/3ML nebulizer solution 3 mL (not administered)  predniSONE (DELTASONE) tablet 60 mg (not administered)  sodium chloride 0.9 % bolus 1,000 mL (not administered)  cefTRIAXone (ROCEPHIN) 1 g in dextrose 5 % 50 mL IVPB (not administered)  azithromycin (ZITHROMAX) 500 mg in dextrose 5 % 250 mL IVPB (not administered)  0.9 %  sodium chloride infusion (not administered)  methylPREDNISolone sodium succinate (SOLU-MEDROL) 125 mg/2 mL injection 60 mg (not administered)  insulin aspart (novoLOG) injection 0-9 Units (not administered)  insulin aspart (novoLOG) injection 0-5 Units (not administered)     Initial Impression / Assessment and Plan / ED Course  I have reviewed the triage vital signs and the nursing notes.  Pertinent  labs & imaging results that were available during my care of the patient were reviewed by me and considered in my medical decision  making (see chart for details).  Clinical Course   82 year old male with community acquired pneumonia that did not improve with three days of Levaquin. His had worsening cough, shortness of breath, weakness, decreased urine output and appetite over the last few days. Here with decreased breath sounds, rhonchi and wheezing. Likely COPD exacerbation along with the community acquired pneumonia and dehydration.  We'll give fluid bolus, steroids, antibiotics, DuoNeb and admit for further management.  Discussed case with triad team and will admit to telemetry   Final Clinical Impressions(s) / ED Diagnoses   Final diagnoses:  COPD exacerbation ([place])  CAP (community acquired pneumonia)     Merrily Pew, MD 05/10/16 1642

## 2016-05-10 NOTE — ED Notes (Addendum)
CBG 76, No s/s of hypoglycemia

## 2016-05-10 NOTE — Telephone Encounter (Signed)
LM x 1 

## 2016-05-10 NOTE — ED Triage Notes (Addendum)
Pt was seen by pulmonologist for sob.  Today sob increased.  Sats of 100% on 2L.  Labored RR.  L sided rib pain .

## 2016-05-11 ENCOUNTER — Inpatient Hospital Stay (HOSPITAL_COMMUNITY): Payer: Medicare Other

## 2016-05-11 DIAGNOSIS — J9601 Acute respiratory failure with hypoxia: Secondary | ICD-10-CM

## 2016-05-11 DIAGNOSIS — I482 Chronic atrial fibrillation: Secondary | ICD-10-CM

## 2016-05-11 DIAGNOSIS — M7989 Other specified soft tissue disorders: Secondary | ICD-10-CM

## 2016-05-11 DIAGNOSIS — E785 Hyperlipidemia, unspecified: Secondary | ICD-10-CM

## 2016-05-11 DIAGNOSIS — I10 Essential (primary) hypertension: Secondary | ICD-10-CM

## 2016-05-11 DIAGNOSIS — J439 Emphysema, unspecified: Secondary | ICD-10-CM

## 2016-05-11 DIAGNOSIS — J189 Pneumonia, unspecified organism: Secondary | ICD-10-CM

## 2016-05-11 DIAGNOSIS — E43 Unspecified severe protein-calorie malnutrition: Secondary | ICD-10-CM

## 2016-05-11 DIAGNOSIS — I739 Peripheral vascular disease, unspecified: Secondary | ICD-10-CM

## 2016-05-11 DIAGNOSIS — N179 Acute kidney failure, unspecified: Secondary | ICD-10-CM

## 2016-05-11 LAB — GLUCOSE, CAPILLARY
Glucose-Capillary: 132 mg/dL — ABNORMAL HIGH (ref 65–99)
Glucose-Capillary: 170 mg/dL — ABNORMAL HIGH (ref 65–99)
Glucose-Capillary: 206 mg/dL — ABNORMAL HIGH (ref 65–99)
Glucose-Capillary: 217 mg/dL — ABNORMAL HIGH (ref 65–99)
Glucose-Capillary: 256 mg/dL — ABNORMAL HIGH (ref 65–99)

## 2016-05-11 LAB — CBC
HEMATOCRIT: 32.9 % — AB (ref 39.0–52.0)
HEMOGLOBIN: 10.4 g/dL — AB (ref 13.0–17.0)
MCH: 33 pg (ref 26.0–34.0)
MCHC: 31.6 g/dL (ref 30.0–36.0)
MCV: 104.4 fL — AB (ref 78.0–100.0)
Platelets: 180 10*3/uL (ref 150–400)
RBC: 3.15 MIL/uL — ABNORMAL LOW (ref 4.22–5.81)
RDW: 13.5 % (ref 11.5–15.5)
WBC: 3 10*3/uL — ABNORMAL LOW (ref 4.0–10.5)

## 2016-05-11 LAB — COMPREHENSIVE METABOLIC PANEL
ALBUMIN: 2.5 g/dL — AB (ref 3.5–5.0)
ALK PHOS: 51 U/L (ref 38–126)
ALT: 8 U/L — ABNORMAL LOW (ref 17–63)
ANION GAP: 11 (ref 5–15)
AST: 13 U/L — ABNORMAL LOW (ref 15–41)
BUN: 34 mg/dL — ABNORMAL HIGH (ref 6–20)
CALCIUM: 8.3 mg/dL — AB (ref 8.9–10.3)
CO2: 25 mmol/L (ref 22–32)
Chloride: 102 mmol/L (ref 101–111)
Creatinine, Ser: 1.07 mg/dL (ref 0.61–1.24)
Glucose, Bld: 174 mg/dL — ABNORMAL HIGH (ref 65–99)
POTASSIUM: 4.3 mmol/L (ref 3.5–5.1)
Sodium: 138 mmol/L (ref 135–145)
TOTAL PROTEIN: 5.6 g/dL — AB (ref 6.5–8.1)
Total Bilirubin: 0.5 mg/dL (ref 0.3–1.2)

## 2016-05-11 LAB — HEMOGLOBIN A1C
HEMOGLOBIN A1C: 6 % — AB (ref 4.8–5.6)
MEAN PLASMA GLUCOSE: 126 mg/dL

## 2016-05-11 LAB — HIV ANTIBODY (ROUTINE TESTING W REFLEX): HIV SCREEN 4TH GENERATION: NONREACTIVE

## 2016-05-11 MED ORDER — METHYLPREDNISOLONE SODIUM SUCC 40 MG IJ SOLR: 40.0000 mg | Freq: Two times a day (BID) | INTRAMUSCULAR | Status: DC

## 2016-05-11 MED ORDER — AZITHROMYCIN 500 MG PO TABS
500.0000 mg | ORAL_TABLET | Freq: Every day | ORAL | Status: DC
  Administered 2016-05-11: 500 mg via ORAL
  Filled 2016-05-11: qty 1

## 2016-05-11 MED ORDER — GLUCERNA SHAKE PO LIQD
237.0000 mL | ORAL | Status: DC
  Administered 2016-05-11: 237 mL via ORAL

## 2016-05-11 MED ORDER — PREDNISONE 10 MG PO TABS: 30.0000 mg | ORAL_TABLET | Freq: Every day | ORAL | Status: DC

## 2016-05-11 NOTE — Progress Notes (Signed)
VASCULAR LAB PRELIMINARY  PRELIMINARY  PRELIMINARY  PRELIMINARY  Left lower extremity venous duplex completed.    Preliminary report:  There is no obvious evidence of DVT or SVT noted in the visualized veins of the left lower extremity.  Maheen Cwikla, RVT 05/11/2016, 9:13 AM

## 2016-05-11 NOTE — Progress Notes (Signed)
Initial Nutrition Assessment  DOCUMENTATION CODES:  Non-severe (moderate) malnutrition in context of chronic illness  INTERVENTION:  Gave some education on low-sodium diet  Glucerna Shake q24 hrs, each supplement provides 220 kcal and 10 grams of protein  NUTRITION DIAGNOSIS:  Increased nutrient needs related to chronic illness (COPD), acute illness (PNA) as evidenced by estimated nutritional requirements for these condtions  GOAL:  Patient will meet greater than or equal to 90% of their needs  MONITOR:  PO intake, Labs, Weight trends, I & O's  REASON FOR ASSESSMENT:  Consult Assessment of nutrition requirement/status  ASSESSMENT:  82 y/o male PMhx of CVA, COPD, Afib, htn, pvd, recent toe amputation and Documented Protein calorie malnutrition. Presented with progressive respiratory symptoms and SOB x 1 week. ABx did not improve symptoms and he was sent to ED for evaluation.Found to be hydrated. Admitted for acute resp failure with CAP & AKI.   Pt seen up sitting in chair receiving breathing treatment. He states at home his appetite has been good. He is eating 3 meals a day. He denies n/v/c/d. He does not take any supplements. He says he doesn't place salt on any of his foods.   He does not really know what his UBW is. He says his L leg is significantly more swollen and is painful. From past weight history, he appears to be stable, with a recent increase, likely due to his edema.   RD went through some brief low sodium diet education. He reports eating some bacon/sausage but says the portions are small. Denies eating frozen meals or canned soups with any sort of consistency. He does eat sandwiches whenever he doesn't have a cooked meal. He is advised to use fresh meats to make these.   He ate 100% of his Breakfast today and reports good appetite.   He exhibits moderate, borderline severe muscle/fat loss, but from the history available, he appears to be at baseline nutritionally.    NFPE: Mod muscle/fat wasting  Medications reviewed and include: Remeron, prednisone, mag ox, insulin  Labs reviewed:  Recent Labs Lab 05/10/16 1417 05/10/16 1926 05/11/16 0320  NA 137  --  138  K 4.5  --  4.3  CL 99*  --  102  CO2 31  --  25  BUN 36*  --  34*  CREATININE 1.25*  --  1.07  CALCIUM 9.2  --  8.3*  MG  --  1.7  --   PHOS  --  3.6  --   GLUCOSE 106*  --  174*    Diet Order:  Diet heart healthy/carb modified Room service appropriate? Yes; Fluid consistency: Thin  Skin:  Reviewed, no issues  Last BM:  Unknown  Height:  Ht Readings from Last 1 Encounters:  05/10/16 5\' 11"  (1.803 m)   Weight:  Wt Readings from Last 1 Encounters:  05/11/16 143 lb (64.9 kg)   Wt Readings from Last 10 Encounters:  05/11/16 143 lb (64.9 kg)  04/17/16 133 lb 5 oz (60.5 kg)  11/21/15 129 lb 13.6 oz (58.9 kg)  09/14/15 149 lb 14.4 oz (68 kg)  07/21/15 134 lb 9.6 oz (61.1 kg)  06/23/15 132 lb (59.9 kg)  07/22/14 131 lb 9.8 oz (59.7 kg)  05/12/14 134 lb (60.8 kg)  04/27/14 134 lb (60.8 kg)  04/06/14 134 lb (60.8 kg)   Ideal Body Weight:  78.18 kg  BMI:  Body mass index is 19.94 kg/m.  Estimated Nutritional Needs:  Kcal:  1800-2000 kcals Protein:  75-85 g Pro Fluid:  >1.6 liters  EDUCATION NEEDS:  Education needs addressed  Burtis Junes RD, LDN, CNSC Clinical Nutrition Pager: [phone] 05/11/2016 4:21 PM

## 2016-05-11 NOTE — Progress Notes (Addendum)
PROGRESS NOTE    [name]  [id number]  DOB: 05/28/1928  DOA: 05/10/2016 PCP: [name], MD Outpatient Specialists:   Hospital course: Admitted with productive cough, wheezing that have failed outpatient management. Recent toe amputation. Left leg swelling and non specific chest pain. Will treat patient for COPD exacerbation, with possible tracheobronchitis versus pneumonia. Will also pursue doppler ultrasound of left lower extremity  Assessment & Plan:    Acute respiratory failure with hypoxia/ CAP (community acquired pneumonia) -Patient presents with her then 1 week progressive respiratory symptoms of cough and dyspnea and hypoxemia with failed response to outpatient oral Levaquin  -Empiric antibiotics with Rocephin and Zithromax -Supportive care with oxygen and incentive spirometry -blood cultures and sputum culture NGTD -Urinary strep antigen negative  COPD (chronic obstructive pulmonary disease)  -Not actively wheezing -Continue nebulizers on schedule -Continue preadmission for Reva -Wean IV steroids to oral prednisone starting 8/20    Acute kidney injury -resolved -DC IV fluid hydration    Diabetes mellitus -Hold metformin -Follow CBGs and provide SSI -Hemoglobin A1c 6.0%    HTN (hypertension) -Continue carvedilol    Atrial fibrillation  -Continue eliquis and carvedilol -CHADVASc = 6 -Currently rate controlled    Protein-calorie malnutrition, severe  -Obtain weight and calculate BMI; nutrition consultation    Toe infection s/p amputation/  PAD (peripheral artery disease)  -Wife reports patient underwent outpatient to amputation by Dr.Duda to 2 weeks ago -Continue dry dressings and offloading boot as prior to admission -venous duplex did rule out DVT -PT evaluation pending    HLD (hyperlipidemia) -Continue Zocor    History of CVA (cerebrovascular accident) -Continue Zocor -Not on aspirin secondary to need for full dose  anticoagulation with eliquis   DVT prophylaxis: Eliquis Code Status: Full Family Communication: Wife at bedside Disposition Plan: Anticipate discharge back to preadmission home environment pending PT evaluation Consults called: None   Antimicrobials: . Anti-infectives    Start     Dose/Rate Route Frequency Ordered Stop   05/10/16 1630  cefTRIAXone (ROCEPHIN) 1 g in dextrose 5 % 50 mL IVPB     1 g 100 mL/hr over 30 Minutes Intravenous  Once 05/10/16 1623 05/10/16 1827   05/10/16 1630  azithromycin (ZITHROMAX) 500 mg in dextrose 5 % 250 mL IVPB     500 mg 250 mL/hr over 60 Minutes Intravenous Every 24 hours 05/10/16 1623        Subjective: Pt says he is breathing better  Objective: Vitals:   05/10/16 1858 05/10/16 2223 05/11/16 0427 05/11/16 0948  BP: (!) 104/52 (!) 130/49 117/72   Pulse: (!) 52 64 (!) 53   Resp:  16 17   Temp:  98.2 F (36.8 C) 98 F (36.7 C)   TempSrc:  Oral Oral   SpO2:  98% 97% 97%  Weight: 65.2 kg (143 lb 11.2 oz)  64.9 kg (143 lb)   Height: 5\' 11"  (1.803 m)       Intake/Output Summary (Last 24 hours) at 05/11/16 1043 Last data filed at 05/11/16 0902  Gross per 24 hour  Intake           513.75 ml  Output              375 ml  Net           138.75 ml   Filed Weights   05/10/16 1858 05/11/16 0427  Weight: 65.2 kg (143 lb 11.2 oz) 64.9 kg (143 lb)    Exam:  General exam: Elderly  gentleman awake, alert in no distress. Respiratory system: Clear. Shallow. No increased work of breathing. Cardiovascular system: S1 & S2 heard. No JVD, murmurs, gallops, clicks or pedal edema. Gastrointestinal system: Abdomen is nondistended, soft and nontender. Normal bowel sounds heard. Central nervous system: Alert and oriented. No focal neurological deficits. Extremities: No pretibial edema.  Data Reviewed: Basic Metabolic Panel:  Recent Labs Lab 05/10/16 1417 05/10/16 1926 05/11/16 0320  NA 137  --  138  K 4.5  --  4.3  CL 99*  --  102  CO2 31  --   25  GLUCOSE 106*  --  174*  BUN 36*  --  34*  CREATININE 1.25*  --  1.07  CALCIUM 9.2  --  8.3*  MG  --  1.7  --   PHOS  --  3.6  --    Liver Function Tests:  Recent Labs Lab 05/10/16 1926 05/11/16 0320  AST 17 13*  ALT 9* 8*  ALKPHOS 55 51  BILITOT 0.6 0.5  PROT 6.0* 5.6*  ALBUMIN 2.7* 2.5*   No results for input(s): LIPASE, AMYLASE in the last 168 hours. No results for input(s): AMMONIA in the last 168 hours. CBC:  Recent Labs Lab 05/10/16 1417 05/11/16 0320  WBC 5.0 3.0*  HGB 11.5* 10.4*  HCT 36.8* 32.9*  MCV 105.1* 104.4*  PLT 212 180   Cardiac Enzymes:  Recent Labs Lab 05/10/16 1417  TROPONINI <0.03   CBG (last 3)   Recent Labs  05/10/16 2142 05/10/16 2354 05/11/16 0742  GLUCAP 132* 156* 170*   No results found for this or any previous visit (from the past 240 hour(s)).   Studies: Dg Chest 2 View  Result Date: 05/11/2016 CLINICAL DATA:  Shortness of breath, improved since yesterday. EXAM: CHEST  2 VIEW COMPARISON:  05/10/2016 FINDINGS: Cardiac silhouette is mildly enlarged. No mediastinal or hilar masses or convincing adenopathy. Lungs are hyperexpanded. There is bibasilar lung opacity most likely atelectasis. Pneumonia not excluded. Findings similar to the previous day's study. Stable granuloma in the right lower lung. Lungs otherwise clear. No pneumothorax. IMPRESSION: 1. No significant change from previous day's study. 2. Persistent lung base opacity that is most likely due to atelectasis. Pneumonia is possible. No pulmonary edema. 3. Mild cardiomegaly.  Hyperexpanded lungs. Electronically Signed   By: Lajean Manes M.D.   On: 05/11/2016 09:10   Dg Chest 2 View  Result Date: 05/10/2016 CLINICAL DATA:  Shortness of breath and weakness. Left posterior rib pain. EXAM: CHEST  2 VIEW COMPARISON:  05/08/2016 FINDINGS: Again noted is hyperinflation lungs with streaky densities in the left lower chest. There is a chronic calcified granuloma at the right  lung base. Few linear densities at the right lung base. Upper lungs are clear. Heart and mediastinum are stable. Atherosclerotic calcifications involving the thoracic aorta. No large pleural effusions. No acute bone abnormality. Old left lower rib fractures. IMPRESSION: Streaky densities at both lung bases, left side greater than right. Minimal change from the recent examination. Findings could represent areas of atelectasis but cannot exclude infection at the left lung base. Aortic atherosclerosis. Electronically Signed   By: Markus Daft M.D.   On: 05/10/2016 16:08     Scheduled Meds: . apixaban  5 mg Oral BID  . azithromycin  500 mg Intravenous Q24H  . carvedilol  6.25 mg Oral BID WC  . insulin aspart  0-5 Units Subcutaneous QHS  . insulin aspart  0-9 Units Subcutaneous TID WC  . ipratropium-albuterol  3 mL Nebulization TID  . magnesium oxide  200 mg Oral Daily  . methylPREDNISolone (SOLU-MEDROL) injection  40 mg Intravenous Q12H  . mirtazapine  15 mg Oral QHS  . montelukast  10 mg Oral QHS  . pantoprazole  40 mg Oral BID  . simvastatin  40 mg Oral QPM  . tiotropium  18 mcg Inhalation Daily   Continuous Infusions:   Principal Problem:   Acute respiratory failure with hypoxia (HCC) Active Problems:   PAD (peripheral artery disease) (HCC)   HTN (hypertension)   Diabetes mellitus (HCC)   HLD (hyperlipidemia)   COPD (chronic obstructive pulmonary disease) (HCC)   Atrial fibrillation (HCC)   History of CVA (cerebrovascular accident)   CAP (community acquired pneumonia)   Protein-calorie malnutrition, severe ([place])   Acute kidney injury ([place])   Toe infection s/p amputation  Time spent:   Irwin Brakeman, MD, FAAFP [facility] Pager [phone]  If 7PM-7AM, please contact night-coverage [email or web address] Password [email or web address] 05/11/2016, 10:43 AM    LOS: 1 day

## 2016-05-11 NOTE — Evaluation (Signed)
Physical Therapy Evaluation Patient Details Name: [name] MRN: [id number] DOB: 1928-06-28 Today's Date: 05/11/2016   History of Present Illness  82 y.o. male with medical history significant for remote CVA with residual left hemiparesis, COPD, atrial fibrillation on eliquis, hypertension, peripheral vascular disease with recent toe amputation left foot, dyslipidemia, and documented protein calorie malnutrition. He was admitted for respiratory failure.  Clinical Impression  Pt admitted with above diagnosis. Pt currently with functional limitations due to the deficits listed below (see PT Problem List). On eval, pt required min assist for bed mobility and transfers. Unable to progress ambulation due to weakness. Pt will benefit from skilled PT to increase their independence and safety with mobility to allow discharge to the venue listed below.       Follow Up Recommendations Home health PT;Supervision/Assistance - 24 hour    Equipment Recommendations  None recommended by PT    Recommendations for Other Services       Precautions / Restrictions Precautions Precautions: None Required Braces or Orthoses: Other Brace/Splint Other Brace/Splint: multi-podus boot LLE      Mobility  Bed Mobility Overal bed mobility: Needs Assistance Bed Mobility: Supine to Sit     Supine to sit: Min assist     General bed mobility comments: assist with LLE  Transfers Overall transfer level: Needs assistance Equipment used: None Transfers: Sit to/from [facility] Sit to Stand: Mod assist Stand pivot transfers: Mod assist       General transfer comment: verbal cues for sequencing  Ambulation/Gait             General Gait Details: unable to ambulate due to weakness  Stairs            Wheelchair Mobility    Modified Rankin (Stroke Patients Only)       Balance Overall balance assessment: Needs assistance Sitting-balance support: Feet supported;No  upper extremity supported Sitting balance-Leahy Scale: Good     Standing balance support: Single extremity supported;During functional activity Standing balance-Leahy Scale: Poor                               Pertinent Vitals/Pain Pain Assessment: No/denies pain    Home Living Family/patient expects to be discharged to:: Private residence Living Arrangements: Spouse/significant other Available Help at Discharge: Family;Available 24 hours/day Type of Home: Mobile home Home Access: Ramped entrance     Home Layout: One level Home Equipment: Toilet riser;Wheelchair - Education administrator (comment) Additional Comments: hemiwalker    Prior Function Level of Independence: Needs assistance   Gait / Transfers Assistance Needed: Walks short distances in the home with hemiwalker and assist from family member.  ADL's / Homemaking Assistance Needed: wife cares for home        Hand Dominance   Dominant Hand: Right    Extremity/Trunk Assessment   Upper Extremity Assessment: LUE deficits/detail       LUE Deficits / Details: deficits from previous CVA   Lower Extremity Assessment: LLE deficits/detail   LLE Deficits / Details: deficits from previous CVA  Cervical / Trunk Assessment: Normal  Communication   Communication: HOH  Cognition Arousal/Alertness: Awake/alert Behavior During Therapy: WFL for tasks assessed/performed Overall Cognitive Status: History of cognitive impairments - at baseline                      General Comments      Exercises  Assessment/Plan    PT Assessment Patient needs continued PT services  PT Diagnosis Difficulty walking;Generalized weakness   PT Problem List Decreased strength;Decreased activity tolerance;Decreased balance;Decreased mobility;Decreased safety awareness  PT Treatment Interventions Gait training;Functional mobility training;Therapeutic activities;Therapeutic exercise;Balance training;Patient/family  education;Neuromuscular re-education   PT Goals (Current goals can be found in the Care Plan section) Acute Rehab PT Goals Patient Stated Goal: home PT Goal Formulation: With patient/family Time For Goal Achievement: 05/25/16 Potential to Achieve Goals: Fair    Frequency Min 3X/week   Barriers to discharge        Co-evaluation               End of Session Equipment Utilized During Treatment: Gait belt;Oxygen;Other (comment) (multi podus boot L) Activity Tolerance: Patient tolerated treatment well Patient left: in chair;with family/visitor present;with call bell/phone within reach Nurse Communication: Mobility status         Time: 1356-1420 PT Time Calculation (min) (ACUTE ONLY): 24 min   Charges:   PT Evaluation $PT Eval Moderate Complexity: 1 Procedure PT Treatments $Therapeutic Activity: 8-22 mins   PT G Codes:        Lorriane Shire 05/11/2016, 3:45 PM

## 2016-05-12 DIAGNOSIS — E440 Moderate protein-calorie malnutrition: Secondary | ICD-10-CM | POA: Insufficient documentation

## 2016-05-12 DIAGNOSIS — L089 Local infection of the skin and subcutaneous tissue, unspecified: Secondary | ICD-10-CM

## 2016-05-12 DIAGNOSIS — Z8673 Personal history of transient ischemic attack (TIA), and cerebral infarction without residual deficits: Secondary | ICD-10-CM

## 2016-05-12 DIAGNOSIS — E1151 Type 2 diabetes mellitus with diabetic peripheral angiopathy without gangrene: Secondary | ICD-10-CM

## 2016-05-12 LAB — BASIC METABOLIC PANEL
Anion gap: 8 (ref 5–15)
BUN: 39 mg/dL — AB (ref 6–20)
CHLORIDE: 101 mmol/L (ref 101–111)
CO2: 27 mmol/L (ref 22–32)
Calcium: 8.6 mg/dL — ABNORMAL LOW (ref 8.9–10.3)
Creatinine, Ser: 1.23 mg/dL (ref 0.61–1.24)
GFR calc non Af Amer: 51 mL/min — ABNORMAL LOW (ref >60)
GFR, EST AFRICAN AMERICAN: 59 mL/min — AB (ref >60)
Glucose, Bld: 155 mg/dL — ABNORMAL HIGH (ref 65–99)
POTASSIUM: 4.8 mmol/L (ref 3.5–5.1)
SODIUM: 136 mmol/L (ref 135–145)

## 2016-05-12 LAB — GLUCOSE, CAPILLARY: Glucose-Capillary: 136 mg/dL — ABNORMAL HIGH (ref 65–99)

## 2016-05-12 MED ORDER — CARVEDILOL 6.25 MG PO TABS
6.2500 mg | ORAL_TABLET | Freq: Two times a day (BID) | ORAL | Status: DC
  Administered 2016-05-12: 6.25 mg via ORAL
  Filled 2016-05-12: qty 1

## 2016-05-12 MED ORDER — GLUCERNA SHAKE PO LIQD: 237.0000 mL | ORAL | 0 refills | Status: DC

## 2016-05-12 MED ORDER — PREDNISONE 20 MG PO TABS
30.0000 mg | ORAL_TABLET | Freq: Every day | ORAL | Status: DC
  Administered 2016-05-12: 30 mg via ORAL
  Filled 2016-05-12: qty 1

## 2016-05-12 MED ORDER — AZITHROMYCIN 500 MG PO TABS: 500.0000 mg | ORAL_TABLET | Freq: Every day | ORAL | 0 refills | Status: AC

## 2016-05-12 MED ORDER — ALBUTEROL SULFATE (2.5 MG/3ML) 0.083% IN NEBU: 2.5000 mg | INHALATION_SOLUTION | Freq: Four times a day (QID) | RESPIRATORY_TRACT | Status: DC | PRN

## 2016-05-12 MED ORDER — INSULIN ASPART 100 UNIT/ML ~~LOC~~ SOLN
0.0000 [IU] | Freq: Three times a day (TID) | SUBCUTANEOUS | Status: DC
  Administered 2016-05-12: 1 [IU] via SUBCUTANEOUS

## 2016-05-12 MED ORDER — IPRATROPIUM-ALBUTEROL 0.5-2.5 (3) MG/3ML IN SOLN: 3.0000 mL | Freq: Two times a day (BID) | RESPIRATORY_TRACT | Status: DC

## 2016-05-12 MED ORDER — FUROSEMIDE 20 MG PO TABS: 20.0000 mg | ORAL_TABLET | ORAL | Status: DC

## 2016-05-12 MED ORDER — PREDNISONE 10 MG PO TABS: 10.0000 mg | ORAL_TABLET | Freq: Every day | ORAL | 0 refills | Status: AC

## 2016-05-12 NOTE — Discharge Instructions (Signed)
Dehydration Dehydration means your body does not have as much fluid as it needs. Your kidneys, brain, and heart will not work properly without the right amount of fluids and salt. Older adults are more likely to become dehydrated than younger adults. This is because:   Their bodies do not hold water as well.  Their bodies do not respond to temperature changes as well.  They do not get thirsty as easily or as quickly. HOME CARE  Ask your doctor how to replace body fluid losses (rehydrate).  Drink enough fluids to keep your pee (urine) clear or pale yellow.  Drink small amounts of fluids often if you feel sick to your stomach (nauseous) or throw up (vomit).  Eat like you normally do.  Avoid:  Foods or drinks high in sugar.  Bubbly (carbonated) drinks.  Juice.  Very hot or cold fluids.  Drinks with caffeine.  Fatty, greasy foods.  Alcohol.  Tobacco.  Eating too much.  Gelatin desserts.  Wash your hands to avoid spreading germs (bacteria, viruses).  Only take medicine as told by your doctor.  Keep all doctor visits as told. GET HELP IF:  You have belly (abdominal) pain that gets worse or stays in one spot (localizes).  You have a rash, stiff neck, or bad headache.  You get easily annoyed, sleepy, or are hard to wake up.  You feel weak, dizzy, or very thirsty.  You have a fever. GET HELP RIGHT AWAY IF:   You cannot drink fluid without throwing up.  You get worse even with treatment.  You throw up often.  You have watery poop (diarrhea) often.  Your vomit has blood in it or looks greenish.  Your poop (stool) has blood in it or looks black and tarry.  You have not peed in 6 to 8 hours or have only peed a small amount of very dark pee.  You pass out (faint). MAKE SURE YOU:   Understand these instructions.  Will watch your condition.  Will get help right away if you are not doing well or get worse.   This information is not intended to replace  advice given to you by your health care provider. Make sure you discuss any questions you have with your health care provider.   Document Released: 08/29/2011 Document Revised: 09/14/2013 Document Reviewed: 05/17/2013 Elsevier Interactive Patient Education 2016 [employer].    Respiratory failure is when your lungs are not working well and your breathing (respiratory) system fails. When respiratory failure occurs, it is difficult for your lungs to get enough oxygen, get rid of carbon dioxide, or both. Respiratory failure can be life threatening.  Respiratory failure can be acute or chronic. Acute respiratory failure is sudden, severe, and requires emergency medical treatment. Chronic respiratory failure is less severe, happens over time, and requires ongoing treatment.  WHAT ARE THE CAUSES OF ACUTE RESPIRATORY FAILURE?  Any problem affecting the heart or lungs can cause acute respiratory failure. Some of these causes include the following:  Chronic bronchitis and emphysema (COPD).   Blood clot going to a lung (pulmonary embolism).   Having water in the lungs caused by heart failure, lung injury, or infection (pulmonary edema).   Collapsed lung (pneumothorax).   Pneumonia.   Pulmonary fibrosis.   Obesity.   Asthma.   Heart failure.   Any type of trauma to the chest that can make breathing difficult.   Nerve or muscle diseases making chest movements difficult. HOW WILL MY ACUTE RESPIRATORY FAILURE BE  TREATED?  Treatment of acute respiratory failure depends on the cause of the respiratory failure. Usually, you will stay in the intensive care unit so your breathing can be watched closely. Treatment can include the following:  Oxygen. Oxygen can be delivered through the following:  Nasal cannula. This is small tubing that goes in your nose to give you oxygen.  Face mask. A face mask covers your nose and mouth to give you oxygen.  Medicine. Different medicines can be  given to help with breathing. These can include:  Nebulizers. Nebulizers deliver medicines to open the air passages (bronchodilators). These medicines help to open or relax the airways in the lungs so you can breathe better. They can also help loosen mucus from your lungs.  Diuretics. Diuretic medicines can help you breathe better by getting rid of extra water in your body.  Steroids. Steroid medicines can help decrease swelling (inflammation) in your lungs.  Antibiotics.  Chest tube. If you have a collapsed lung (pneumothorax), a chest tube is placed to help reinflate the lung.  Noninvasive positive pressure ventilation (NPPV). This is a tight-fitting mask that goes over your nose and mouth. The mask has tubing that is attached to a machine. The machine blows air into the tubing, which helps to keep the tiny air sacs (alveoli) in your lungs open. This machine allows you to breathe on your own.  Ventilator. A ventilator is a breathing machine. When on a ventilator, a breathing tube is put into the lungs. A ventilator is used when you can no longer breathe well enough on your own. You may have low oxygen levels or high carbon dioxide (CO2) levels in your blood. When you are on a ventilator, sedation and pain medicines are given to make you sleep so your lungs can heal. SEEK IMMEDIATE MEDICAL CARE IF:   You have shortness of breath (dyspnea) with or without activity.  You have rapid breathing (tachypnea).  You are wheezing.  You are unable to say more than a few words without having to catch your breath.  You find it very difficult to function normally.  You have a fast heart rate.  You have a bluish color to your finger or toe nail beds.  You have confusion or drowsiness or both.   This information is not intended to replace advice given to you by your health care provider. Make sure you discuss any questions you have with your health care provider.   Document Released: 09/14/2013  Document Revised: 05/31/2015 Document Reviewed: 09/14/2013 Elsevier Interactive Patient Education 2016 [employer]   Chronic Obstructive Pulmonary Disease Exacerbation Chronic obstructive pulmonary disease (COPD) is a common lung problem. In COPD, the flow of air from the lungs is limited. COPD exacerbations are times that breathing gets worse and you need extra treatment. Without treatment they can be life threatening. If they happen often, your lungs can become more damaged. If your COPD gets worse, your doctor may treat you with:  Medicines.  Oxygen.  Different ways to clear your airway, such as using a mask. HOME CARE  Do not smoke.  Avoid tobacco smoke and other things that bother your lungs.  If given, take your antibiotic medicine as told. Finish the medicine even if you start to feel better.  Only take medicines as told by your doctor.  Drink enough fluids to keep your pee (urine) clear or pale yellow (unless your doctor has told you not to).  Use a cool mist machine (vaporizer).  If you  use oxygen or a machine that turns liquid medicine into a mist (nebulizer), continue to use them as told.  Keep up with shots (vaccinations) as told by your doctor.  Exercise regularly.  Eat healthy foods.  Keep all doctor visits as told. GET HELP RIGHT AWAY IF:  You are very short of breath and it gets worse.  You have trouble talking.  You have bad chest pain.  You have blood in your spit (sputum).  You have a fever.  You keep throwing up (vomiting).  You feel weak, or you pass out (faint).  You feel confused.  You keep getting worse. MAKE SURE YOU:  Understand these instructions.  Will watch your condition.  Will get help right away if you are not doing well or get worse.   This information is not intended to replace advice given to you by your health care provider. Make sure you discuss any questions you have with your health care provider.   Document  Released: 08/29/2011 Document Revised: 09/30/2014 Document Reviewed: 05/14/2013 Elsevier Interactive Patient Education 2016 [employer] Inc[employer]   Cough, Adult A cough helps to clear your throat and lungs. A cough may last only 2-3 weeks (acute), or it may last longer than 8 weeks (chronic). Many different things can cause a cough. A cough may be a sign of an illness or another medical condition. HOME CARE  Pay attention to any changes in your cough.  Take medicines only as told by your doctor.  If you were prescribed an antibiotic medicine, take it as told by your doctor. Do not stop taking it even if you start to feel better.  Talk with your doctor before you try using a cough medicine.  Drink enough fluid to keep your pee (urine) clear or pale yellow.  If the air is dry, use a cold steam vaporizer or humidifier in your home.  Stay away from things that make you cough at work or at home.  If your cough is worse at night, try using extra pillows to raise your head up higher while you sleep.  Do not smoke, and try not to be around smoke. If you need help quitting, ask your doctor.  Do not have caffeine.  Do not drink alcohol.  Rest as needed. GET HELP IF:  You have new problems (symptoms).  You cough up yellow fluid (pus).  Your cough does not get better after 2-3 weeks, or your cough gets worse.  Medicine does not help your cough and you are not sleeping well.  You have pain that gets worse or pain that is not helped with medicine.  You have a fever.  You are losing weight and you do not know why.  You have night sweats. GET HELP RIGHT AWAY IF:  You cough up blood.  You have trouble breathing.  Your heartbeat is very fast.   This information is not intended to replace advice given to you by your health care provider. Make sure you discuss any questions you have with your health care provider.   Document Released: 05/23/2011 Document Revised: 05/31/2015 Document  Reviewed: 11/16/2014 Elsevier Interactive Patient Education 2016 [employer].   How to Use an Inhaler Proper inhaler technique is very important. Good technique ensures that the medicine reaches the lungs. Poor technique results in depositing the medicine on the tongue and back of the throat rather than in the airways. If you do not use the inhaler with good technique, the medicine will not help you.  STEPS TO FOLLOW IF USING AN INHALER WITHOUT AN EXTENSION TUBE 1. Remove the cap from the inhaler. 2. If you are using the inhaler for the first time, you will need to prime it. Shake the inhaler for 5 seconds and release four puffs into the air, away from your face. Ask your health care provider or pharmacist if you have questions about priming your inhaler. 3. Shake the inhaler for 5 seconds before each breath in (inhalation). 4. Position the inhaler so that the top of the canister faces up. 5. Put your index finger on the top of the medicine canister. Your thumb supports the bottom of the inhaler. 6. Open your mouth. 7. Either place the inhaler between your teeth and place your lips tightly around the mouthpiece, or hold the inhaler 1-2 inches away from your open mouth. If you are unsure of which technique to use, ask your health care provider. 8. Breathe out (exhale) normally and as completely as possible. 9. Press the canister down with your index finger to release the medicine. 10. At the same time as the canister is pressed, inhale deeply and slowly until your lungs are completely filled. This should take 4-6 seconds. Keep your tongue down. 11. Hold the medicine in your lungs for 5-10 seconds (10 seconds is best). This helps the medicine get into the small airways of your lungs. 12. Breathe out slowly, through pursed lips. Whistling is an example of pursed lips. 13. Wait at least 15-30 seconds between puffs. Continue with the above steps until you have taken the number of puffs your health  care provider has ordered. Do not use the inhaler more than your health care provider tells you. 14. Replace the cap on the inhaler. 15. Follow the directions from your health care provider or the inhaler insert for cleaning the inhaler. STEPS TO FOLLOW IF USING AN INHALER WITH AN EXTENSION (SPACER) 1. Remove the cap from the inhaler. 2. If you are using the inhaler for the first time, you will need to prime it. Shake the inhaler for 5 seconds and release four puffs into the air, away from your face. Ask your health care provider or pharmacist if you have questions about priming your inhaler. 3. Shake the inhaler for 5 seconds before each breath in (inhalation). 4. Place the open end of the spacer onto the mouthpiece of the inhaler. 5. Position the inhaler so that the top of the canister faces up and the spacer mouthpiece faces you. 6. Put your index finger on the top of the medicine canister. Your thumb supports the bottom of the inhaler and the spacer. 7. Breathe out (exhale) normally and as completely as possible. 8. Immediately after exhaling, place the spacer between your teeth and into your mouth. Close your lips tightly around the spacer. 9. Press the canister down with your index finger to release the medicine. 10. At the same time as the canister is pressed, inhale deeply and slowly until your lungs are completely filled. This should take 4-6 seconds. Keep your tongue down and out of the way. 11. Hold the medicine in your lungs for 5-10 seconds (10 seconds is best). This helps the medicine get into the small airways of your lungs. Exhale. 12. Repeat inhaling deeply through the spacer mouthpiece. Again hold that breath for up to 10 seconds (10 seconds is best). Exhale slowly. If it is difficult to take this second deep breath through the spacer, breathe normally several times through the spacer. Remove the spacer  from your mouth. 13. Wait at least 15-30 seconds between puffs. Continue with  the above steps until you have taken the number of puffs your health care provider has ordered. Do not use the inhaler more than your health care provider tells you. 14. Remove the spacer from the inhaler, and place the cap on the inhaler. 15. Follow the directions from your health care provider or the inhaler insert for cleaning the inhaler and spacer. If you are using different kinds of inhalers, use your quick relief medicine to open the airways 10-15 minutes before using a steroid if instructed to do so by your health care provider. If you are unsure which inhalers to use and the order of using them, ask your health care provider, nurse, or respiratory therapist. If you are using a steroid inhaler, always rinse your mouth with water after your last puff, then gargle and spit out the water. Do not swallow the water. AVOID:  Inhaling before or after starting the spray of medicine. It takes practice to coordinate your breathing with triggering the spray.  Inhaling through the nose (rather than the mouth) when triggering the spray. HOW TO DETERMINE IF YOUR INHALER IS FULL OR NEARLY EMPTY You cannot know when an inhaler is empty by shaking it. A few inhalers are now being made with dose counters. Ask your health care provider for a prescription that has a dose counter if you feel you need that extra help. If your inhaler does not have a counter, ask your health care provider to help you determine the date you need to refill your inhaler. Write the refill date on a calendar or your inhaler canister. Refill your inhaler 7-10 days before it runs out. Be sure to keep an adequate supply of medicine. This includes making sure it is not expired, and that you have a spare inhaler.  SEEK MEDICAL CARE IF:   Your symptoms are only partially relieved with your inhaler.  You are having trouble using your inhaler.  You have some increase in phlegm. SEEK IMMEDIATE MEDICAL CARE IF:   You feel little or no relief  with your inhalers. You are still wheezing and are feeling shortness of breath or tightness in your chest or both.  You have dizziness, headaches, or a fast heart rate.  You have chills, fever, or night sweats.  You have a noticeable increase in phlegm production, or there is blood in the phlegm. MAKE SURE YOU:   Understand these instructions.  Will watch your condition.  Will get help right away if you are not doing well or get worse.   This information is not intended to replace advice given to you by your health care provider. Make sure you discuss any questions you have with your health care provider.   Document Released: 09/06/2000 Document Revised: 06/30/2013 Document Reviewed: 04/08/2013 Elsevier Interactive Patient Education 2016 [employer] Acute Kidney Injury Acute kidney injury is any condition in which there is sudden (acute) damage to the kidneys. Acute kidney injury was previously known as acute kidney failure or acute renal failure. The kidneys are two organs that lie on either side of the spine between the middle of the back and the front of the abdomen. The kidneys:  Remove wastes and extra water from the blood.   Produce important hormones. These help keep bones strong, regulate blood pressure, and help create red blood cells.   Balance the fluids and chemicals in the blood and tissues. A small amount of kidney  damage may not cause problems, but a large amount of damage may make it difficult or impossible for the kidneys to work the way they should. Acute kidney injury may develop into long-lasting (chronic) kidney disease. It may also develop into a life-threatening disease called end-stage kidney disease. Acute kidney injury can get worse very quickly, so it should be treated right away. Early treatment may prevent other kidney diseases from developing. CAUSES   A problem with blood flow to the kidneys. This may be caused by:   Blood loss.   Heart disease.    Severe burns.   Liver disease.  Direct damage to the kidneys. This may be caused by:  Some medicines.   A kidney infection.   Poisoning or consuming toxic substances.   A surgical wound.   A blow to the kidney area.   A problem with urine flow. This may be caused by:   Cancer.   Kidney stones.   An enlarged prostate. SIGNS AND SYMPTOMS   Swelling (edema) of the legs, ankles, or feet.   Tiredness (lethargy).   Nausea or vomiting.   Confusion.   Problems with urination, such as:   Painful or burning feeling during urination.   Decreased urine production.   Frequent accidents in children who are potty trained.   Bloody urine.   Muscle twitches and cramps.   Shortness of breath.   Seizures.   Chest pain or pressure. Sometimes, no symptoms are present. DIAGNOSIS Acute kidney injury may be detected and diagnosed by tests, including blood, urine, imaging, or kidney biopsy tests.  TREATMENT Treatment of acute kidney injury varies depending on the cause and severity of the kidney damage. In mild cases, no treatment may be needed. The kidneys may heal on their own. If acute kidney injury is more severe, your health care provider will treat the cause of the kidney damage, help the kidneys heal, and prevent complications from occurring. Severe cases may require a procedure to remove toxic wastes from the body (dialysis) or surgery to repair kidney damage. Surgery may involve:   Repair of a torn kidney.   Removal of an obstruction. HOME CARE INSTRUCTIONS  Follow your prescribed diet.  Take medicines only as directed by your health care provider.  Do not take any new medicines (prescription, over-the-counter, or nutritional supplements) unless approved by your health care provider. Many medicines can worsen your kidney damage or may need to have the dose adjusted.   Keep all follow-up visits as directed by your health care provider.  This is important.  Observe your condition to make sure you are healing as expected. SEEK IMMEDIATE MEDICAL CARE IF:  You are feeling ill or have severe pain in the back or side.   Your symptoms return or you have new symptoms.  You have any symptoms of end-stage kidney disease. These include:   Persistent itchiness.   Loss of appetite.   Headaches.   Abnormally dark or light skin.  Numbness in the hands or feet.   Easy bruising.   Frequent hiccups.   Menstruation stops.   You have a fever.  You have increased urine production.  You have pain or bleeding when urinating. MAKE SURE YOU:   Understand these instructions.  Will watch your condition.  Will get help right away if you are not doing well or get worse.   This information is not intended to replace advice given to you by your health care provider. Make sure you discuss any questions  you have with your health care provider.   Document Released: 03/25/2011 Document Revised: 09/30/2014 Document Reviewed: 05/08/2012 Elsevier Interactive Patient Education [employer].

## 2016-05-12 NOTE — Discharge Summary (Addendum)
Physician Discharge Summary  Dainel Overbey [facility] [id number] DOB: [birth year] DOA: 05/10/2016  PCP: [name], MD  Admit date: 05/10/2016 Discharge date: 05/12/2016  Admitted From: Home Disposition: Home with HHPT  Recommendations for Outpatient Follow-up:  1. Follow up with PCP in 1 weeks 2. Please obtain BMP/CBC in one week  Home Health: YES  Discharge Condition:Stable CODE STATUS: FULL  Brief/Interim Summary: Hospital course: Admitted with productive cough, wheezing that have failed outpatient management. Recent toe amputation. Left leg swelling and non specific chest pain. Will treat patient for COPD exacerbation, with possible tracheobronchitis versus pneumonia. Will also pursue doppler ultrasound of left lower extremity  Assessment & Plan:   Acute respiratory failure with hypoxia/CAP (community acquired pneumonia) -Patient presents with her then 1 week progressive respiratory symptoms of cough and dyspnea and hypoxemia with failed response to outpatient oral Levaquin  -Empiric antibiotics with Rocephin and Zithromax -Supportive care with oxygen and incentive spirometry -blood cultures and sputum culture NGTD -Urinary strep antigen negative  COPD (chronic obstructive pulmonary disease) with mild acute exacerbation - resolved -Not actively wheezing -Continue nebulizers on schedule -Continue preadmission for Reva -Weaned IV steroids to oral prednisone starting 8/20  Acute kidney injury -resolved -DC IV fluid hydration  Diabetesmellitus -Hold metformin -Follow CBGs and provide SSI -Hemoglobin A1c 6.0%  HTN (hypertension) -Continue carvedilol  Atrial fibrillation  -Continue eliquis and carvedilol -CHADVASc = 6 -Currently rate controlled  Protein-calorie malnutrition, severe -Obtain weight and calculate BMI;nutrition consultation with supplements ordered.  Toe infection s/p amputation/PAD (peripheral artery disease)  -Wife reports  patient underwent outpatient to amputation by Dr.Dudato 2 weeks ago - Follow up with Dr. Sharol Given as scheduled. -Continue dry dressings and offloading boot as prior to admission -venous duplex did rule out DVT  HLD (hyperlipidemia) -Continue Zocor  History of CVA (cerebrovascular accident) -Continue Zocor -Not on aspirin secondary to need for full dose anticoagulation with eliquis  DVT prophylaxis:Eliquis Code Status:Full Family Communication:Wife atbedside Disposition Plan: Home with HHPT Consults called:None  Discharge Diagnoses:  Principal Problem:   Acute respiratory failure with hypoxia (Home) Active Problems:   PAD (peripheral artery disease) (HCC)   HTN (hypertension)   Diabetes mellitus ([place])   HLD (hyperlipidemia)   COPD (chronic obstructive pulmonary disease) ([place])   Atrial fibrillation ([place])   History of CVA (cerebrovascular accident)   CAP (community acquired pneumonia)   Protein-calorie malnutrition, severe ([place])   Acute kidney injury ([place])   Toe infection s/p amputation  Discharge Instructions  Discharge Instructions    Increase activity slowly    Complete by:  As directed       Medication List    STOP taking these medications   levofloxacin 500 MG tablet Commonly known as:  LEVAQUIN   oxyCODONE-acetaminophen 5-325 MG tablet Commonly known as:  PERCOCET/ROXICET   traMADol 50 MG tablet Commonly known as:  ULTRAM     TAKE these medications   acetaminophen 325 MG tablet Commonly known as:  TYLENOL Take 2 tablets (650 mg total) by mouth every 4 (four) hours as needed for mild pain.   albuterol (2.5 MG/3ML) 0.083% nebulizer solution Commonly known as:  PROVENTIL Take 2.5 mg by nebulization every 6 (six) hours as needed for wheezing or shortness of breath.   allopurinol 100 MG tablet Commonly known as:  ZYLOPRIM Take 100 mg by mouth 2 (two) times daily as needed (for gout flares).   azithromycin 500 MG tablet Commonly known as:   ZITHROMAX Take 1 tablet (500 mg total) by mouth daily.  carvedilol 6.25 MG tablet Commonly known as:  COREG Take 6.25 mg by mouth 2 (two) times daily with a meal.   colchicine 0.6 MG tablet Take 1 tablet (0.6 mg total) by mouth 2 (two) times daily. What changed:  when to take this  reasons to take this   ELIQUIS 5 MG Tabs tablet Generic drug:  apixaban Take 5 mg by mouth 2 (two) times daily.   feeding supplement (GLUCERNA SHAKE) Liqd Take 237 mLs by mouth daily.   furosemide 20 MG tablet Commonly known as:  LASIX Take 1 tablet (20 mg total) by mouth every other day. Start taking on:  05/13/2016 What changed:  when to take this   magnesium oxide 400 MG tablet Commonly known as:  MAG-OX Take 200 mg by mouth daily.   Melatonin 3 MG Tabs Take 3 mg by mouth at bedtime as needed (sleep).   metFORMIN 1000 MG tablet Commonly known as:  GLUCOPHAGE Take 0.5 tablets (500 mg total) by mouth 2 (two) times daily with a meal.   mirtazapine 15 MG tablet Commonly known as:  REMERON Take 15 mg by mouth at bedtime.   OCUVITE PRESERVISION PO Take 1 tablet by mouth daily.   pantoprazole 40 MG tablet Commonly known as:  PROTONIX Take 1 tablet (40 mg total) by mouth 2 (two) times daily.   predniSONE 10 MG tablet Commonly known as:  DELTASONE Take 1 tablet (10 mg total) by mouth daily with breakfast. Start taking on:  05/13/2016   simvastatin 40 MG tablet Commonly known as:  ZOCOR Take 40 mg by mouth every evening.   tiotropium 18 MCG inhalation capsule Commonly known as:  SPIRIVA Place 18 mcg into inhaler and inhale daily.   vitamin B-12 1000 MCG tablet Commonly known as:  CYANOCOBALAMIN Take 1,000 mcg by mouth daily.      Follow-up Information    [name], MD. Schedule an appointment as soon as possible for a visit in 1 week(s).   Specialty:  Internal Medicine Why:  Hospital Follow Up Contact information: [address] [place]  [address] [phone]        [facility], MD. Schedule an appointment as soon as possible for a visit in 3 week(s).   Specialty:  Pulmonary Disease Why:  Follow Up Contact information: [address] [place]  [address] [phone]          Allergies  Allergen Reactions  . No Known Allergies     Procedures/Studies: Dg Chest 2 View  Result Date: 05/11/2016 CLINICAL DATA:  Shortness of breath, improved since yesterday. EXAM: CHEST  2 VIEW COMPARISON:  05/10/2016 FINDINGS: Cardiac silhouette is mildly enlarged. No mediastinal or hilar masses or convincing adenopathy. Lungs are hyperexpanded. There is bibasilar lung opacity most likely atelectasis. Pneumonia not excluded. Findings similar to the previous day's study. Stable granuloma in the right lower lung. Lungs otherwise clear. No pneumothorax. IMPRESSION: 1. No significant change from previous day's study. 2. Persistent lung base opacity that is most likely due to atelectasis. Pneumonia is possible. No pulmonary edema. 3. Mild cardiomegaly.  Hyperexpanded lungs. Electronically Signed   By: Lajean Manes M.D.   On: 05/11/2016 09:10   Dg Chest 2 View  Result Date: 05/10/2016 CLINICAL DATA:  Shortness of breath and weakness. Left posterior rib pain. EXAM: CHEST  2 VIEW COMPARISON:  05/08/2016 FINDINGS: Again noted is hyperinflation lungs with streaky densities in the left lower chest. There is a chronic calcified granuloma at the right lung  base. Few linear densities at the right lung base. Upper lungs are clear. Heart and mediastinum are stable. Atherosclerotic calcifications involving the thoracic aorta. No large pleural effusions. No acute bone abnormality. Old left lower rib fractures. IMPRESSION: Streaky densities at both lung bases, left side greater than right. Minimal change from the recent examination. Findings could represent areas of atelectasis but cannot exclude infection at the left lung base. Aortic atherosclerosis.  Electronically Signed   By: Markus Daft M.D.   On: 05/10/2016 16:08   Dg Chest 2 View  Result Date: 05/09/2016 CLINICAL DATA:  Productive cough and shortness of breath for several days EXAM: CHEST  2 VIEW COMPARISON:  02/15/2016 FINDINGS: Cardiac shadow is stable. Changes of prior granulomatous disease are again noted. Blunting of the costophrenic angles is noted bilaterally related to hyperinflation stable from the prior study. Mild increased density is noted in the left lung base which may represent focal infiltrate. IMPRESSION: Mild changes in the left lung base which may represent a combination of atelectasis and infiltrate. Followup PA and lateral chest X-ray is recommended in 3-4 weeks following trial of antibiotic therapy to ensure resolution and exclude underlying malignancy. Electronically Signed   By: Inez Catalina M.D.   On: 05/09/2016 08:11    Subjective: Pt says he feels better,  No CP, no SOB, eating and drinking well.   Discharge Exam: Vitals:   05/12/16 0606 05/12/16 0957  BP: (!) 108/56 (!) 120/56  Pulse: 99 64  Resp: 16   Temp: 97.5 F (36.4 C)    Vitals:   05/12/16 0606 05/12/16 0759 05/12/16 0801 05/12/16 0957  BP: (!) 108/56   (!) 120/56  Pulse: 99   64  Resp: 16     Temp: 97.5 F (36.4 C)     TempSrc: Oral     SpO2: 100% 99% 99%   Weight: 68.4 kg (150 lb 14.4 oz)     Height:        General exam: Elderly gentleman awake, alert in no distress. Respiratory system: Clear. Shallow. No increased work of breathing. Cardiovascular system: S1 & S2 heard. No JVD, murmurs, gallops, clicks or pedal edema. Gastrointestinal system: Abdomen is nondistended, soft and nontender. Normal bowel sounds heard. Central nervous system: Alert and oriented. No focal neurological deficits. Extremities: No pretibial edema.   The results of significant diagnostics from this hospitalization (including imaging, microbiology, ancillary and laboratory) are listed below for reference.      Microbiology: Recent Results (from the past 240 hour(s))  Culture, blood (routine x 2) Call MD if unable to obtain prior to antibiotics being given     Status: None (Preliminary result)   Collection Time: 05/10/16  5:48 PM  Result Value Ref Range Status   Specimen Description BLOOD LEFT ANTECUBITAL  Final   Special Requests BOTTLES DRAWN AEROBIC AND ANAEROBIC 5CC  Final   Culture NO GROWTH < 24 HOURS  Final   Report Status PENDING  Incomplete  Culture, blood (routine x 2) Call MD if unable to obtain prior to antibiotics being given     Status: None (Preliminary result)   Collection Time: 05/10/16  6:04 PM  Result Value Ref Range Status   Specimen Description BLOOD LEFT HAND  Final   Special Requests IN PEDIATRIC BOTTLE [place]  Final   Culture NO GROWTH < 24 HOURS  Final   Report Status PENDING  Incomplete     Labs: BNP (last 3 results)  Recent Labs  05/10/16 1813  BNP  [id number]*   Basic Metabolic Panel:  Recent Labs Lab 05/10/16 1417 05/10/16 1926 05/11/16 0320 05/12/16 0342  NA 137  --  138 136  K 4.5  --  4.3 4.8  CL 99*  --  102 101  CO2 31  --  25 27  GLUCOSE 106*  --  174* 155*  BUN 36*  --  34* 39*  CREATININE 1.25*  --  1.07 1.23  CALCIUM 9.2  --  8.3* 8.6*  MG  --  1.7  --   --   PHOS  --  3.6  --   --    Liver Function Tests:  Recent Labs Lab 05/10/16 1926 05/11/16 0320  AST 17 13*  ALT 9* 8*  ALKPHOS 55 51  BILITOT 0.6 0.5  PROT 6.0* 5.6*  ALBUMIN 2.7* 2.5*   No results for input(s): LIPASE, AMYLASE in the last 168 hours. No results for input(s): AMMONIA in the last 168 hours. CBC:  Recent Labs Lab 05/10/16 1417 05/11/16 0320  WBC 5.0 3.0*  HGB 11.5* 10.4*  HCT 36.8* 32.9*  MCV 105.1* 104.4*  PLT 212 180   Cardiac Enzymes:  Recent Labs Lab 05/10/16 1417  TROPONINI <0.03   BNP: Invalid input(s): POCBNP CBG:  Recent Labs Lab 05/11/16 0742 05/11/16 1146 05/11/16 1648 05/11/16 2205 05/12/16 0606  GLUCAP 170* 206* 217* 256*  136*   D-Dimer No results for input(s): DDIMER in the last 72 hours. Hgb A1c  Recent Labs  05/10/16 1820  HGBA1C 6.0*   Lipid Profile No results for input(s): CHOL, HDL, LDLCALC, TRIG, CHOLHDL, LDLDIRECT in the last 72 hours. Thyroid function studies No results for input(s): TSH, T4TOTAL, T3FREE, THYROIDAB in the last 72 hours.  Invalid input(s): FREET3 Anemia work up No results for input(s): VITAMINB12, FOLATE, FERRITIN, TIBC, IRON, RETICCTPCT in the last 72 hours. Urinalysis    Component Value Date/Time   COLORURINE YELLOW 07/02/2014 1141   APPEARANCEUR CLEAR 07/02/2014 1141   LABSPEC 1.015 07/02/2014 1141   PHURINE 5.0 07/02/2014 1141   GLUCOSEU NEGATIVE 07/02/2014 1141   HGBUR NEGATIVE 07/02/2014 1141   BILIRUBINUR MODERATE (A) 07/02/2014 1141   KETONESUR 15 (A) 07/02/2014 1141   PROTEINUR 30 (A) 07/02/2014 1141   UROBILINOGEN 0.2 07/02/2014 1141   NITRITE NEGATIVE 07/02/2014 1141   LEUKOCYTESUR NEGATIVE 07/02/2014 1141   Sepsis Labs Invalid input(s): PROCALCITONIN,  WBC,  LACTICIDVEN Microbiology Recent Results (from the past 240 hour(s))  Culture, blood (routine x 2) Call MD if unable to obtain prior to antibiotics being given     Status: None (Preliminary result)   Collection Time: 05/10/16  5:48 PM  Result Value Ref Range Status   Specimen Description BLOOD LEFT ANTECUBITAL  Final   Special Requests BOTTLES DRAWN AEROBIC AND ANAEROBIC 5CC  Final   Culture NO GROWTH < 24 HOURS  Final   Report Status PENDING  Incomplete  Culture, blood (routine x 2) Call MD if unable to obtain prior to antibiotics being given     Status: None (Preliminary result)   Collection Time: 05/10/16  6:04 PM  Result Value Ref Range Status   Specimen Description BLOOD LEFT HAND  Final   Special Requests IN PEDIATRIC BOTTLE [place]  Final   Culture NO GROWTH < 24 HOURS  Final   Report Status PENDING  Incomplete   Time coordinating discharge: 33 minutes  SIGNED:  Irwin Brakeman,  MD  [facility] 05/12/2016, 10:53 AM Pager   If 7PM-7AM, please contact night-coverage [email or web address]  Password [email or web address]

## 2016-05-12 NOTE — Progress Notes (Signed)
Dressing change to foot done per order. A.Courtney Bellizzi, RN

## 2016-05-12 NOTE — Progress Notes (Signed)
Patient discharged to home accompanied by nurse tech via wheelchair.  Discharge instructions given.  Patient/Wife verbalized understanding.  Personal belongings given to patient.  Telemetry box removed. CCMD notified.  IV removed per order. Site in good condition. No further needs at this time. A.Jossette Zirbel, RN

## 2016-05-13 DIAGNOSIS — J449 Chronic obstructive pulmonary disease, unspecified: Secondary | ICD-10-CM | POA: Diagnosis not present

## 2016-05-13 NOTE — Telephone Encounter (Signed)
Per pt's d/c instructions:  RAMASWAMY,MURALI, MD. Schedule an appointment as soon as possible for a visit in 3 week(s).   Specialty:  Pulmonary Disease Why:  Follow Up Contact information: [place] [address] [phone]   Called and scheduled pt with MR on 06/06/16 at 1445 for hospital follow up, pt aware. Will sign off.

## 2016-05-15 LAB — CULTURE, BLOOD (ROUTINE X 2)
CULTURE: NO GROWTH
CULTURE: NO GROWTH

## 2016-05-17 DIAGNOSIS — Z09 Encounter for follow-up examination after completed treatment for conditions other than malignant neoplasm: Secondary | ICD-10-CM | POA: Diagnosis not present

## 2016-05-17 DIAGNOSIS — J449 Chronic obstructive pulmonary disease, unspecified: Secondary | ICD-10-CM | POA: Diagnosis not present

## 2016-05-17 DIAGNOSIS — M109 Gout, unspecified: Secondary | ICD-10-CM | POA: Diagnosis not present

## 2016-05-17 DIAGNOSIS — E118 Type 2 diabetes mellitus with unspecified complications: Secondary | ICD-10-CM | POA: Diagnosis not present

## 2016-05-17 DIAGNOSIS — I4891 Unspecified atrial fibrillation: Secondary | ICD-10-CM | POA: Diagnosis not present

## 2016-06-06 ENCOUNTER — Inpatient Hospital Stay: Payer: Medicare Other | Admitting: Internal Medicine

## 2016-06-07 DIAGNOSIS — Z23 Encounter for immunization: Secondary | ICD-10-CM | POA: Diagnosis not present

## 2016-06-07 DIAGNOSIS — R05 Cough: Secondary | ICD-10-CM | POA: Diagnosis not present

## 2016-06-10 DIAGNOSIS — I70262 Atherosclerosis of native arteries of extremities with gangrene, left leg: Secondary | ICD-10-CM | POA: Diagnosis not present

## 2016-06-10 DIAGNOSIS — L97521 Non-pressure chronic ulcer of other part of left foot limited to breakdown of skin: Secondary | ICD-10-CM | POA: Diagnosis not present

## 2016-06-10 DIAGNOSIS — Z89422 Acquired absence of other left toe(s): Secondary | ICD-10-CM | POA: Diagnosis not present

## 2016-06-10 DIAGNOSIS — M25572 Pain in left ankle and joints of left foot: Secondary | ICD-10-CM | POA: Diagnosis not present

## 2016-06-13 DIAGNOSIS — J449 Chronic obstructive pulmonary disease, unspecified: Secondary | ICD-10-CM | POA: Diagnosis not present

## 2016-06-18 DIAGNOSIS — L03116 Cellulitis of left lower limb: Secondary | ICD-10-CM | POA: Diagnosis not present

## 2016-06-18 DIAGNOSIS — J441 Chronic obstructive pulmonary disease with (acute) exacerbation: Secondary | ICD-10-CM | POA: Diagnosis not present

## 2016-06-24 ENCOUNTER — Ambulatory Visit (INDEPENDENT_AMBULATORY_CARE_PROVIDER_SITE_OTHER): Payer: Medicare Other | Admitting: Orthopedic Surgery

## 2016-06-24 DIAGNOSIS — I70262 Atherosclerosis of native arteries of extremities with gangrene, left leg: Secondary | ICD-10-CM

## 2016-06-24 DIAGNOSIS — L03032 Cellulitis of left toe: Secondary | ICD-10-CM

## 2016-06-24 DIAGNOSIS — M86172 Other acute osteomyelitis, left ankle and foot: Secondary | ICD-10-CM

## 2016-06-24 DIAGNOSIS — L97521 Non-pressure chronic ulcer of other part of left foot limited to breakdown of skin: Secondary | ICD-10-CM

## 2016-06-28 DIAGNOSIS — I739 Peripheral vascular disease, unspecified: Secondary | ICD-10-CM | POA: Diagnosis not present

## 2016-06-28 DIAGNOSIS — Z7901 Long term (current) use of anticoagulants: Secondary | ICD-10-CM | POA: Diagnosis not present

## 2016-06-28 DIAGNOSIS — I482 Chronic atrial fibrillation: Secondary | ICD-10-CM | POA: Diagnosis not present

## 2016-07-01 ENCOUNTER — Inpatient Hospital Stay: Payer: Medicare Other | Admitting: Internal Medicine

## 2016-07-10 DIAGNOSIS — E119 Type 2 diabetes mellitus without complications: Secondary | ICD-10-CM | POA: Diagnosis not present

## 2016-07-10 DIAGNOSIS — I10 Essential (primary) hypertension: Secondary | ICD-10-CM | POA: Diagnosis not present

## 2016-07-12 ENCOUNTER — Emergency Department (HOSPITAL_COMMUNITY): Payer: Medicare Other

## 2016-07-12 ENCOUNTER — Encounter (HOSPITAL_COMMUNITY): Payer: Self-pay

## 2016-07-12 ENCOUNTER — Inpatient Hospital Stay (HOSPITAL_COMMUNITY)
Admission: EM | Admit: 2016-07-12 | Discharge: 2016-07-17 | DRG: 177 | Disposition: A | Discharge status: Hospice | Payer: Medicare Other | Attending: Internal Medicine | Admitting: Internal Medicine

## 2016-07-12 DIAGNOSIS — J698 Pneumonitis due to inhalation of other solids and liquids: Secondary | ICD-10-CM | POA: Diagnosis not present

## 2016-07-12 DIAGNOSIS — F0390 Unspecified dementia without behavioral disturbance: Secondary | ICD-10-CM | POA: Diagnosis present

## 2016-07-12 DIAGNOSIS — E1151 Type 2 diabetes mellitus with diabetic peripheral angiopathy without gangrene: Secondary | ICD-10-CM | POA: Diagnosis present

## 2016-07-12 DIAGNOSIS — Z7951 Long term (current) use of inhaled steroids: Secondary | ICD-10-CM

## 2016-07-12 DIAGNOSIS — R531 Weakness: Secondary | ICD-10-CM | POA: Diagnosis not present

## 2016-07-12 DIAGNOSIS — I4891 Unspecified atrial fibrillation: Secondary | ICD-10-CM | POA: Diagnosis not present

## 2016-07-12 DIAGNOSIS — Z6821 Body mass index (BMI) 21.0-21.9, adult: Secondary | ICD-10-CM | POA: Diagnosis not present

## 2016-07-12 DIAGNOSIS — I482 Chronic atrial fibrillation: Secondary | ICD-10-CM

## 2016-07-12 DIAGNOSIS — Y95 Nosocomial condition: Secondary | ICD-10-CM | POA: Diagnosis present

## 2016-07-12 DIAGNOSIS — J439 Emphysema, unspecified: Secondary | ICD-10-CM | POA: Diagnosis not present

## 2016-07-12 DIAGNOSIS — H3530 Unspecified macular degeneration: Secondary | ICD-10-CM | POA: Diagnosis present

## 2016-07-12 DIAGNOSIS — Z9981 Dependence on supplemental oxygen: Secondary | ICD-10-CM | POA: Diagnosis not present

## 2016-07-12 DIAGNOSIS — J9610 Chronic respiratory failure, unspecified whether with hypoxia or hypercapnia: Secondary | ICD-10-CM | POA: Diagnosis not present

## 2016-07-12 DIAGNOSIS — Z89412 Acquired absence of left great toe: Secondary | ICD-10-CM

## 2016-07-12 DIAGNOSIS — J449 Chronic obstructive pulmonary disease, unspecified: Secondary | ICD-10-CM | POA: Diagnosis present

## 2016-07-12 DIAGNOSIS — Z515 Encounter for palliative care: Secondary | ICD-10-CM

## 2016-07-12 DIAGNOSIS — Z7901 Long term (current) use of anticoagulants: Secondary | ICD-10-CM | POA: Diagnosis not present

## 2016-07-12 DIAGNOSIS — Z66 Do not resuscitate: Secondary | ICD-10-CM | POA: Diagnosis not present

## 2016-07-12 DIAGNOSIS — K219 Gastro-esophageal reflux disease without esophagitis: Secondary | ICD-10-CM | POA: Diagnosis not present

## 2016-07-12 DIAGNOSIS — E43 Unspecified severe protein-calorie malnutrition: Secondary | ICD-10-CM | POA: Diagnosis not present

## 2016-07-12 DIAGNOSIS — Z8249 Family history of ischemic heart disease and other diseases of the circulatory system: Secondary | ICD-10-CM

## 2016-07-12 DIAGNOSIS — R1310 Dysphagia, unspecified: Secondary | ICD-10-CM

## 2016-07-12 DIAGNOSIS — M546 Pain in thoracic spine: Secondary | ICD-10-CM | POA: Diagnosis not present

## 2016-07-12 DIAGNOSIS — F0151 Vascular dementia with behavioral disturbance: Secondary | ICD-10-CM

## 2016-07-12 DIAGNOSIS — R05 Cough: Secondary | ICD-10-CM | POA: Diagnosis not present

## 2016-07-12 DIAGNOSIS — L89622 Pressure ulcer of left heel, stage 2: Secondary | ICD-10-CM | POA: Diagnosis not present

## 2016-07-12 DIAGNOSIS — Z7984 Long term (current) use of oral hypoglycemic drugs: Secondary | ICD-10-CM | POA: Diagnosis not present

## 2016-07-12 DIAGNOSIS — I10 Essential (primary) hypertension: Secondary | ICD-10-CM | POA: Diagnosis present

## 2016-07-12 DIAGNOSIS — K5900 Constipation, unspecified: Secondary | ICD-10-CM | POA: Diagnosis present

## 2016-07-12 DIAGNOSIS — Z89422 Acquired absence of other left toe(s): Secondary | ICD-10-CM

## 2016-07-12 DIAGNOSIS — F01518 Vascular dementia, unspecified severity, with other behavioral disturbance: Secondary | ICD-10-CM

## 2016-07-12 DIAGNOSIS — I2510 Atherosclerotic heart disease of native coronary artery without angina pectoris: Secondary | ICD-10-CM | POA: Diagnosis not present

## 2016-07-12 DIAGNOSIS — Z87891 Personal history of nicotine dependence: Secondary | ICD-10-CM | POA: Diagnosis not present

## 2016-07-12 DIAGNOSIS — Z8673 Personal history of transient ischemic attack (TIA), and cerebral infarction without residual deficits: Secondary | ICD-10-CM | POA: Diagnosis not present

## 2016-07-12 DIAGNOSIS — R0602 Shortness of breath: Secondary | ICD-10-CM | POA: Diagnosis not present

## 2016-07-12 DIAGNOSIS — L8990 Pressure ulcer of unspecified site, unspecified stage: Secondary | ICD-10-CM | POA: Insufficient documentation

## 2016-07-12 DIAGNOSIS — Z7189 Other specified counseling: Secondary | ICD-10-CM

## 2016-07-12 DIAGNOSIS — Z79899 Other long term (current) drug therapy: Secondary | ICD-10-CM

## 2016-07-12 DIAGNOSIS — R0600 Dyspnea, unspecified: Secondary | ICD-10-CM

## 2016-07-12 DIAGNOSIS — J189 Pneumonia, unspecified organism: Secondary | ICD-10-CM

## 2016-07-12 DIAGNOSIS — M1990 Unspecified osteoarthritis, unspecified site: Secondary | ICD-10-CM | POA: Diagnosis present

## 2016-07-12 LAB — CBC WITH DIFFERENTIAL/PLATELET
Basophils Absolute: 0.1 10*3/uL (ref 0.0–0.1)
Basophils Relative: 2 %
Eosinophils Absolute: 0.1 10*3/uL (ref 0.0–0.7)
Eosinophils Relative: 2 %
HCT: 33.2 % — ABNORMAL LOW (ref 39.0–52.0)
Hemoglobin: 10.7 g/dL — ABNORMAL LOW (ref 13.0–17.0)
Lymphocytes Relative: 23 %
Lymphs Abs: 1.6 10*3/uL (ref 0.7–4.0)
MCH: 32.5 pg (ref 26.0–34.0)
MCHC: 32.2 g/dL (ref 30.0–36.0)
MCV: 100.9 fL — ABNORMAL HIGH (ref 78.0–100.0)
Monocytes Absolute: 1.2 10*3/uL — ABNORMAL HIGH (ref 0.1–1.0)
Monocytes Relative: 17 %
Neutro Abs: 3.9 10*3/uL (ref 1.7–7.7)
Neutrophils Relative %: 56 %
Platelets: 189 10*3/uL (ref 150–400)
RBC: 3.29 MIL/uL — ABNORMAL LOW (ref 4.22–5.81)
RDW: 14.7 % (ref 11.5–15.5)
WBC: 7 10*3/uL (ref 4.0–10.5)

## 2016-07-12 LAB — COMPREHENSIVE METABOLIC PANEL
ALBUMIN: 3 g/dL — AB (ref 3.5–5.0)
ALK PHOS: 72 U/L (ref 38–126)
ALT: 11 U/L — AB (ref 17–63)
AST: 20 U/L (ref 15–41)
Anion gap: 8 (ref 5–15)
BUN: 38 mg/dL — ABNORMAL HIGH (ref 6–20)
CALCIUM: 8.6 mg/dL — AB (ref 8.9–10.3)
CHLORIDE: 103 mmol/L (ref 101–111)
CO2: 27 mmol/L (ref 22–32)
CREATININE: 1.01 mg/dL (ref 0.61–1.24)
GFR calc non Af Amer: >60 mL/min (ref >60)
GLUCOSE: 120 mg/dL — AB (ref 65–99)
Potassium: 4.6 mmol/L (ref 3.5–5.1)
SODIUM: 138 mmol/L (ref 135–145)
Total Bilirubin: 0.5 mg/dL (ref 0.3–1.2)
Total Protein: 6.6 g/dL (ref 6.5–8.1)

## 2016-07-12 LAB — I-STAT CG4 LACTIC ACID, ED: Lactic Acid, Venous: 1.91 mmol/L (ref 0.5–1.9)

## 2016-07-12 LAB — I-STAT TROPONIN, ED
TROPONIN I, POC: 0 ng/mL (ref 0.00–0.08)
Troponin i, poc: 0 ng/mL (ref 0.00–0.08)

## 2016-07-12 LAB — CBG MONITORING, ED: GLUCOSE-CAPILLARY: 126 mg/dL — AB (ref 65–99)

## 2016-07-12 MED ORDER — VANCOMYCIN HCL 10 G IV SOLR
1250.0000 mg | Freq: Once | INTRAVENOUS | Status: AC
  Administered 2016-07-12: 1250 mg via INTRAVENOUS
  Filled 2016-07-12: qty 1250

## 2016-07-12 MED ORDER — MIRTAZAPINE 15 MG PO TABS
7.5000 mg | ORAL_TABLET | Freq: Every day | ORAL | Status: DC
  Administered 2016-07-12 – 2016-07-15 (×2): 7.5 mg via ORAL
  Administered 2016-07-16: 15 mg via ORAL
  Filled 2016-07-12 (×4): qty 1

## 2016-07-12 MED ORDER — SODIUM CHLORIDE 0.9% FLUSH: 3.0000 mL | INTRAVENOUS | Status: DC | PRN

## 2016-07-12 MED ORDER — DEXTROSE 5 % IV SOLN
1.0000 g | Freq: Once | INTRAVENOUS | Status: AC
  Administered 2016-07-12: 1 g via INTRAVENOUS
  Filled 2016-07-12: qty 1

## 2016-07-12 MED ORDER — DEXTROSE 5 % IV SOLN
1.0000 g | Freq: Three times a day (TID) | INTRAVENOUS | Status: DC
  Administered 2016-07-13 – 2016-07-14 (×4): 1 g via INTRAVENOUS
  Filled 2016-07-12 (×6): qty 1

## 2016-07-12 MED ORDER — SODIUM CHLORIDE 0.9% FLUSH
3.0000 mL | Freq: Two times a day (BID) | INTRAVENOUS | Status: DC
  Administered 2016-07-13 – 2016-07-15 (×3): 3 mL via INTRAVENOUS

## 2016-07-12 MED ORDER — VANCOMYCIN HCL 500 MG IV SOLR
500.0000 mg | Freq: Two times a day (BID) | INTRAVENOUS | Status: DC
  Administered 2016-07-13 – 2016-07-14 (×3): 500 mg via INTRAVENOUS
  Filled 2016-07-12 (×6): qty 500

## 2016-07-12 MED ORDER — SODIUM CHLORIDE 0.9 % IV SOLN: 250.0000 mL | INTRAVENOUS | Status: DC | PRN

## 2016-07-12 MED ORDER — TIOTROPIUM BROMIDE MONOHYDRATE 18 MCG IN CAPS
18.0000 ug | ORAL_CAPSULE | Freq: Every day | RESPIRATORY_TRACT | Status: DC
  Administered 2016-07-13 – 2016-07-16 (×4): 18 ug via RESPIRATORY_TRACT
  Filled 2016-07-12 (×2): qty 5

## 2016-07-12 MED ORDER — SODIUM CHLORIDE 0.9 % IV BOLUS (SEPSIS)
1000.0000 mL | Freq: Once | INTRAVENOUS | Status: AC
  Administered 2016-07-12: 1000 mL via INTRAVENOUS

## 2016-07-12 MED ORDER — IPRATROPIUM-ALBUTEROL 0.5-2.5 (3) MG/3ML IN SOLN
3.0000 mL | Freq: Four times a day (QID) | RESPIRATORY_TRACT | Status: DC | PRN
  Administered 2016-07-14: 3 mL via RESPIRATORY_TRACT
  Filled 2016-07-12: qty 3

## 2016-07-12 MED ORDER — TIOTROPIUM BROMIDE MONOHYDRATE 2.5 MCG/ACT IN AERS: 2.0000 | INHALATION_SPRAY | Freq: Every day | RESPIRATORY_TRACT | Status: DC

## 2016-07-12 MED ORDER — APIXABAN 5 MG PO TABS
5.0000 mg | ORAL_TABLET | Freq: Two times a day (BID) | ORAL | Status: DC
  Administered 2016-07-12 – 2016-07-13 (×2): 5 mg via ORAL
  Filled 2016-07-12 (×4): qty 1

## 2016-07-12 MED ORDER — CARVEDILOL 6.25 MG PO TABS
6.2500 mg | ORAL_TABLET | Freq: Two times a day (BID) | ORAL | Status: DC
  Administered 2016-07-13 – 2016-07-16 (×4): 6.25 mg via ORAL
  Filled 2016-07-12 (×7): qty 1

## 2016-07-12 NOTE — H&P (Signed)
History and Physical    [name] [id number] DOB: 11-13-1927 DOA: 07/12/2016  PCP: [name], MD  Patient coming from: home  Chief Complaint:   Cough, sob  HPI: [name] is a 82 y.o. male with medical history significant of pna in august treated with levaquin, COPD, on prn oxgyen at 2 liters [place],  CAD comes in with 3 weeks of worsening sob and productive cough.  He denies any chest pain or le edema.  Denies fevers.  Pt seems to have some memory issues, does not recall being hospitalized in august.  No oriented to year but seems to answer other questions appropriately.  His wife has left to go home.  Pt found to have pna again, has a h/o CVA and aspirating in the past.  Referred for admission for HCAP.  Review of Systems: As per HPI otherwise 10 point review of systems negative.  However, may be unreliable.  Past Medical History:  Diagnosis Date  . Anemia   . Arthritis    "legs" (02/24/2014)  . Cellulitis and abscess of toe 08/2015   LEFT FOOT   . Claudication of calf muscles ([place]) 11/05/2012   right calf  . Constipation   . COPD (chronic obstructive pulmonary disease) ([place])   . Coronary artery disease   . Dysrhythmia    atrial fibrilation  . GERD (gastroesophageal reflux disease)   . Hypertension   . Macular degeneration, bilateral    "has had shots in both eyes"  . Neuromuscular disorder ([facility])    dizziness  . On home oxygen therapy    "2L; w/activity" (02/24/2014)  . Peripheral vascular disease ([place])   . Pneumonia    "5 times back to back recently (just finished antibiiotic) " (02/24/2014)  . Shortness of breath    with exertion  . Stroke ([place]) 05/2013   "can't use left arm fully since"  . TIA (transient ischemic attack) 2010  . Type II diabetes mellitus ([place])     Past Surgical History:  Procedure Laterality Date  . AMPUTATION Left 09/12/2015   Procedure: LEFT GREAT TOE AMPUTATION;  Surgeon: Meredith Pel, MD;  Location: [place];  Service:  Orthopedics;  Laterality: Left;  . AMPUTATION Left 04/17/2016   Procedure: 2nd Toe Amputation Left Foot;  Surgeon: [name], MD;  Location: [place];  Service: Orthopedics;  Laterality: Left;  . CARDIAC CATHETERIZATION    . CHEST TUBE INSERTION Left 02/24/2014   Procedure: INSERTION PLEURAL DRAINAGE CATHETER;  Surgeon: Ivin Poot, MD;  Location: [place];  Service: Thoracic;  Laterality: Left;  . COLONOSCOPY    . ESOPHAGOGASTRODUODENOSCOPY N/A 11/02/2013   Procedure: ESOPHAGOGASTRODUODENOSCOPY (EGD);  Surgeon: Cleotis Nipper, MD;  Location: [facility] ENDOSCOPY;  Service: Endoscopy;  Laterality: N/A;  . ESOPHAGOGASTRODUODENOSCOPY (EGD) WITH PROPOFOL N/A 07/06/2014   Procedure: ESOPHAGOGASTRODUODENOSCOPY (EGD) WITH PROPOFOL;  Surgeon: Cleotis Nipper, MD;  Location: [place];  Service: Endoscopy;  Laterality: N/A;  . FEMORAL ARTERY STENT Left 10/2012  . FOOT SURGERY Left    due to broken foot years ago  . LOWER EXTREMITY ANGIOGRAM N/A 11/06/2012   Procedure: LOWER EXTREMITY ANGIOGRAM;  Surgeon: Laverda Page, MD;  Location: [facility] CATH LAB;  Service: Cardiovascular;  Laterality: N/A;  . PERIPHERAL VASCULAR CATHETERIZATION N/A 11/20/2015   Procedure: Lower Extremity Angiography;  Surgeon: Adrian Prows, MD;  Location: [place] CV LAB;  Service: Cardiovascular;  Laterality: N/A;  . PERIPHERAL VASCULAR CATHETERIZATION  11/20/2015   Procedure: Peripheral Vascular Atherectomy;  Surgeon: Adrian Prows, MD;  Location: [place] CV LAB;  Service: Cardiovascular;;  . REMOVAL OF PLEURAL DRAINAGE CATHETER Left 05/03/2014   Procedure: REMOVAL OF PLEURAL DRAINAGE CATHETER;  Surgeon: Ivin Poot, MD;  Location: [place];  Service: Thoracic;  Laterality: Left;  . TRANSURETHRAL RESECTION OF PROSTATE    . VASCULAR SURGERY     stents to legs     reports that he quit smoking about 34 years ago. His smoking use included Cigarettes. He has a 105.00 pack-year smoking history. He has never used smokeless tobacco. He  reports that he drinks alcohol. He reports that he does not use drugs.  Allergies  Allergen Reactions  . No Known Allergies     Family History  Problem Relation Age of Onset  . Hypertension Other   . Cancer Neg Hx   . CAD Neg Hx     Prior to Admission medications   Medication Sig Start Date End Date Taking? Authorizing Provider  acetaminophen (TYLENOL) 325 MG tablet Take 2 tablets (650 mg total) by mouth every 4 (four) hours as needed for mild pain. 11/21/15  Yes Neldon Labella, NP  allopurinol (ZYLOPRIM) 100 MG tablet Take 100 mg by mouth 2 (two) times daily as needed (for gout flares).  06/28/14  Yes Historical Provider, MD  carvedilol (COREG) 6.25 MG tablet Take 6.25 mg by mouth 2 (two) times daily with a meal.   Yes Historical Provider, MD  colchicine 0.6 MG tablet Take 1 tablet (0.6 mg total) by mouth 2 (two) times daily. Patient taking differently: Take 0.6 mg by mouth 2 (two) times daily as needed (for gout flares).  04/21/14  Yes Junius Creamer, NP  docusate sodium (COLACE) 100 MG capsule Take 100 mg by mouth See admin instructions. One to two times a day as needed for constipation   Yes Historical Provider, MD  ELIQUIS 5 MG TABS tablet Take 5 mg by mouth 2 (two) times daily.  04/01/16  Yes Historical Provider, MD  furosemide (LASIX) 20 MG tablet Take 1 tablet (20 mg total) by mouth every other day. Patient taking differently: Take 20 mg by mouth daily as needed for edema.  05/13/16  Yes Clanford Marisa Hua, MD  ipratropium-albuterol (DUONEB) 0.5-2.5 (3) MG/3ML SOLN Take 3 mLs by nebulization every 6 (six) hours as needed (for wheezing or shortness of breath).    Yes Historical Provider, MD  magnesium oxide (MAG-OX) 400 MG tablet Take 200 mg by mouth daily.   Yes Historical Provider, MD  metFORMIN (GLUCOPHAGE) 1000 MG tablet Take 0.5 tablets (500 mg total) by mouth 2 (two) times daily with a meal. 11/22/15  Yes Adrian Prows, MD  mirtazapine (REMERON) 15 MG tablet Take 7.5-15 mg by mouth at  bedtime.    Yes Historical Provider, MD  Multiple Vitamins-Minerals (OCUVITE PRESERVISION PO) Take 1 tablet by mouth daily.   Yes Historical Provider, MD  nitroGLYCERIN (NITRODUR - DOSED IN MG/24 HR) 0.2 mg/hr patch Place 0.2 mg onto the skin daily. 06/21/16  Yes Historical Provider, MD  Nutritional Supplements (ENSURE ENLIVE PO) Take 1 Can by mouth 2 (two) times daily with breakfast and lunch.   Yes Historical Provider, MD  pantoprazole (PROTONIX) 40 MG tablet Take 1 tablet (40 mg total) by mouth 2 (two) times daily. 07/22/14  Yes Charlynne Cousins, MD  simvastatin (ZOCOR) 40 MG tablet Take 40 mg by mouth every evening.   Yes Historical Provider, MD  Tiotropium Bromide Monohydrate (SPIRIVA RESPIMAT) 2.5 MCG/ACT AERS Inhale 2  puffs into the lungs daily.   Yes Historical Provider, MD  vitamin B-12 (CYANOCOBALAMIN) 1000 MCG tablet Take 1,000 mcg by mouth daily.   Yes Historical Provider, MD  feeding supplement, GLUCERNA SHAKE, (GLUCERNA SHAKE) LIQD Take 237 mLs by mouth daily. Patient not taking: Reported on 07/12/2016 05/12/16   Murlean Iba, MD    Physical Exam: Vitals:   07/12/16 2000 07/12/16 2045 07/12/16 2100 07/12/16 2115  BP: 105/60 108/75 121/82 141/95  Pulse: (!) 26  (!) 53 71  Resp: 25 23 22 21   Temp:      TempSrc:      SpO2: 96%  100% 99%  Weight:          Constitutional: NAD, calm, comfortable, coughing sputum up during interview Vitals:   07/12/16 2000 07/12/16 2045 07/12/16 2100 07/12/16 2115  BP: 105/60 108/75 121/82 141/95  Pulse: (!) 26  (!) 53 71  Resp: 25 23 22 21   Temp:      TempSrc:      SpO2: 96%  100% 99%  Weight:       Eyes: PERRL, lids and conjunctivae normal ENMT: Mucous membranes are moist. Posterior pharynx clear of any exudate or lesions.Normal dentition.  Neck: normal, supple, no masses, no thyromegaly Respiratory: no wheezing, no crackles. Bilateral rhonchi.  Normal respiratory effort. No accessory muscle use.  Cardiovascular: Regular rate  and rhythm, no murmurs / rubs / gallops. No extremity edema. 2+ pedal pulses. No carotid bruits.  Abdomen: no tenderness, no masses palpated. No hepatosplenomegaly. Bowel sounds positive.  Musculoskeletal: no clubbing / cyanosis. No joint deformity upper and lower extremities. Good ROM, no contractures. Normal muscle tone.  Skin: no rashes, lesions, ulcers. No induration Neurologic: CN 2-12 grossly intact. Sensation intact, DTR normal. Strength 5/5 in all 4.  Psychiatric: Normal judgment and insight. Alert and oriented x 2. Normal mood.    Labs on Admission: I have personally reviewed following labs and imaging studies  CBC:  Recent Labs Lab 07/12/16 1708  WBC 7.0  NEUTROABS 3.9  HGB 10.7*  HCT 33.2*  MCV 100.9*  PLT 99991111   Basic Metabolic Panel:  Recent Labs Lab 07/12/16 1708  NA 138  K 4.6  CL 103  CO2 27  GLUCOSE 120*  BUN 38*  CREATININE 1.01  CALCIUM 8.6*   GFR: Estimated Creatinine Clearance: 46.7 mL/min (by C-G formula based on SCr of 1.01 mg/dL). Liver Function Tests:  Recent Labs Lab 07/12/16 1708  AST 20  ALT 11*  ALKPHOS 72  BILITOT 0.5  PROT 6.6  ALBUMIN 3.0*    CBG:  Recent Labs Lab 07/12/16 1707  GLUCAP 126*   Urine analysis:    Component Value Date/Time   COLORURINE YELLOW 07/02/2014 [place] 07/02/2014 1141   LABSPEC 1.015 07/02/2014 1141   PHURINE 5.0 07/02/2014 1141   GLUCOSEU NEGATIVE 07/02/2014 1141   HGBUR NEGATIVE 07/02/2014 1141   BILIRUBINUR MODERATE (A) 07/02/2014 1141   KETONESUR 15 (A) 07/02/2014 1141   PROTEINUR 30 (A) 07/02/2014 1141   UROBILINOGEN 0.2 07/02/2014 1141   NITRITE NEGATIVE 07/02/2014 1141   LEUKOCYTESUR NEGATIVE 07/02/2014 1141     Radiological Exams on Admission: Dg Chest 2 View  Result Date: 07/12/2016 CLINICAL DATA:  Shortness of breath and dizziness. Failure to thrive. Coughing. EXAM: CHEST  2 VIEW COMPARISON:  05/11/2016 and multiple previous FINDINGS: Heart size is normal.  There is aortic atherosclerosis. There is a newly seen left effusion with left lower lobe infiltrate and  volume loss. Mild chronic pleural blunting on the right appears the same. Chronic calcified granuloma in the right lower lung is the same. Upper lobe emphysema again noted. IMPRESSION: Development of a left effusion with left lower lobe atelectasis and/or pneumonia. Electronically Signed   By: Nelson Chimes M.D.   On: 07/12/2016 17:53    Assessment/Plan  82 yo male with h/o CVA, aspiration comes in with recurrent pna last treated for same 2 months ago with levaquin  Principal Problem:   Healthcare-associated pneumonia- obtain sputum and blood cx.  Place on vanc and cefepime.  Obtain swallow eval.  Npo x meds for now.  Supplemental oxygen prn  Active Problems:   COPD (chronic obstructive pulmonary disease) ([place])- noted, no wheezing.     Atrial fibrillation ([place])- noted   History of CVA (cerebrovascular accident)- on eloquis   Dysphagia- noted, unknown if on thickened food at home   Respiratory failure, chronic ([place])- noted   DVT prophylaxis: on eloquis Code Status:  Presumptive full code Family Communication:  none Disposition Plan:  Per day team Consults called:  none Admission status:  Admission due to broad spectrum abx for HCAP   DAVID,RACHAL A MD [facility]  If 7PM-7AM, please contact night-coverage [email or web address] Password [email or web address]  07/12/2016, 9:35 PM

## 2016-07-12 NOTE — Progress Notes (Signed)
Pharmacy Antibiotic Note  [name] is a 82 y.o. male admitted on 07/12/2016 with pneumonia.  Pharmacy has been consulted for vancomycin dosing. CC SOB and dizziness. Has 3 week hx of cough.  Plan: Vancomycin 1250 mg IV x1 then 500 mg q12h Cefepime 1g IV x1 Monitor clinical course and renal function  Weight: 138 lb 9 oz (62.9 kg)  Temp (24hrs), Avg:98.5 F (36.9 C), Min:98.5 F (36.9 C), Max:98.5 F (36.9 C)   Recent Labs Lab 07/12/16 1708  WBC 7.0  CREATININE 1.01    Estimated Creatinine Clearance: 46.7 mL/min (by C-G formula based on SCr of 1.01 mg/dL).  Clearance stable   Allergies  Allergen Reactions  . No Known Allergies     Antimicrobials this admission: Vancomycin 10/20 >> Cefepime 10/20 >>  Dose adjustments this admission: N/A  Microbiology results: 10/20 BCx: Sent  Thank you for allowing pharmacy to be a part of this patient's care.  Cheral Almas, PharmD Candidate 07/12/2016 8:06 PM

## 2016-07-12 NOTE — ED Triage Notes (Signed)
Pt family complaining of pt not eating appropriately. Pt complaining of SOB and dizziness. Pt states feels "swimmy headed." Pt on 2 L O2 at home. Pt sats 92% on 3L O2. Pt states cough times x 3 weeks.

## 2016-07-12 NOTE — ED Provider Notes (Signed)
[facility] DEPT Provider Note   CSN: [id number] Arrival date & time: 07/12/16  1655     History   Chief Complaint Chief Complaint  Patient presents with  . Shortness of Breath    HPI [name] is a 82 y.o. male.  Patient presents to the emergency department with chief complaint of cough and shortness of breath. He states that he has had worsening cough for the past 3 weeks. He denies any fevers, but does report having chills. Patient is accompanied by family members, state that he has had worsening cough with productive thick brown sputum. Patient is normally on 2 L of oxygen at home, and has been saturating about 92%. Patient also complains of left sided mid back pain. He was admitted 2 months ago for pneumonia.   The history is provided by the patient. No language interpreter was used.    Past Medical History:  Diagnosis Date  . Anemia   . Arthritis    "legs" (02/24/2014)  . Cellulitis and abscess of toe 08/2015   LEFT FOOT   . Claudication of calf muscles ([place]) 11/05/2012   right calf  . Constipation   . COPD (chronic obstructive pulmonary disease) ([place])   . Coronary artery disease   . Dysrhythmia    atrial fibrilation  . GERD (gastroesophageal reflux disease)   . Hypertension   . Macular degeneration, bilateral    "has had shots in both eyes"  . Neuromuscular disorder (HCC)    dizziness  . On home oxygen therapy    "2L; w/activity" (02/24/2014)  . Peripheral vascular disease ([place])   . Pneumonia    "5 times back to back recently (just finished antibiiotic) " (02/24/2014)  . Shortness of breath    with exertion  . Stroke ([place]) 05/2013   "can't use left arm fully since"  . TIA (transient ischemic attack) 2010  . Type II diabetes mellitus [facility])     Patient Active Problem List   Diagnosis Date Noted  . Malnutrition of moderate degree 05/12/2016  . Critical lower limb ischemia 11/19/2015  . Toe infection s/p amputation 09/13/2015  . Cellulitis  09/11/2015  . Cellulitis of toe of left foot 09/11/2015  . Acute respiratory failure with hypoxia ([place]) 09/11/2015  . Pulmonary embolism on right ([place]) 06/23/2015  . Excessive cerumen in left ear canal 07/28/2014  . Acute confusional state 07/25/2014  . Hypoglycemia 07/25/2014  . Gastric dysmotility 07/20/2014  . Acute kidney injury ([place]) 07/05/2014  . Hypernatremia 07/05/2014  . Pneumatosis intestinalis 07/03/2014  . Hypokalemia 07/02/2014  . Dehydration 07/02/2014  . Gastroenteritis 07/02/2014  . Pleural effusion on left 02/24/2014  . Protein-calorie malnutrition, severe ([place]) 01/03/2014  . UTI (lower urinary tract infection) 12/31/2013  . HCAP (healthcare-associated pneumonia) 12/31/2013  . Aspiration pneumonia ([place]) 12/31/2013  . Acute blood loss anemia 12/31/2013  . Dysphagia, unspecified(787.20) 12/21/2013  . CAP (community acquired pneumonia) 10/31/2013  . TIA (transient ischemic attack) 07/06/2013  . PAD (peripheral artery disease) ([place]) 07/06/2013  . HTN (hypertension) 07/06/2013  . Diabetes mellitus ([place]) 07/06/2013  . HLD (hyperlipidemia) 07/06/2013  . COPD (chronic obstructive pulmonary disease) ([place]) 07/06/2013  . Atrial fibrillation ([place]) 07/06/2013  . History of GI bleed 07/06/2013  . Cardiomegaly 07/06/2013  . History of CVA (cerebrovascular accident) 07/06/2013    Past Surgical History:  Procedure Laterality Date  . AMPUTATION Left 09/12/2015   Procedure: LEFT GREAT TOE AMPUTATION;  Surgeon: Meredith Pel, MD;  Location: [facility];  Service: Orthopedics;  Laterality: Left;  . AMPUTATION Left 04/17/2016   Procedure: 2nd Toe Amputation Left Foot;  Surgeon: [name], MD;  Location: [facility];  Service: Orthopedics;  Laterality: Left;  . CARDIAC CATHETERIZATION    . CHEST TUBE INSERTION Left 02/24/2014   Procedure: INSERTION PLEURAL DRAINAGE CATHETER;  Surgeon: Ivin Poot, MD;  Location: [facility];  Service: Thoracic;  Laterality: Left;  . COLONOSCOPY    .  ESOPHAGOGASTRODUODENOSCOPY N/A 11/02/2013   Procedure: ESOPHAGOGASTRODUODENOSCOPY (EGD);  Surgeon: Cleotis Nipper, MD;  Location: [facility] ENDOSCOPY;  Service: Endoscopy;  Laterality: N/A;  . ESOPHAGOGASTRODUODENOSCOPY (EGD) WITH PROPOFOL N/A 07/06/2014   Procedure: ESOPHAGOGASTRODUODENOSCOPY (EGD) WITH PROPOFOL;  Surgeon: Cleotis Nipper, MD;  Location: [facility];  Service: Endoscopy;  Laterality: N/A;  . FEMORAL ARTERY STENT Left 10/2012  . FOOT SURGERY Left    due to broken foot years ago  . LOWER EXTREMITY ANGIOGRAM N/A 11/06/2012   Procedure: LOWER EXTREMITY ANGIOGRAM;  Surgeon: Laverda Page, MD;  Location: [facility] CATH LAB;  Service: Cardiovascular;  Laterality: N/A;  . PERIPHERAL VASCULAR CATHETERIZATION N/A 11/20/2015   Procedure: Lower Extremity Angiography;  Surgeon: Adrian Prows, MD;  Location: [place] CV LAB;  Service: Cardiovascular;  Laterality: N/A;  . PERIPHERAL VASCULAR CATHETERIZATION  11/20/2015   Procedure: Peripheral Vascular Atherectomy;  Surgeon: Adrian Prows, MD;  Location: [facility] CV LAB;  Service: Cardiovascular;;  . REMOVAL OF PLEURAL DRAINAGE CATHETER Left 05/03/2014   Procedure: REMOVAL OF PLEURAL DRAINAGE CATHETER;  Surgeon: Ivin Poot, MD;  Location: [facility];  Service: Thoracic;  Laterality: Left;  . TRANSURETHRAL RESECTION OF PROSTATE    . VASCULAR SURGERY     stents to legs       Home Medications    Prior to Admission medications   Medication Sig Start Date End Date Taking? Authorizing Provider  acetaminophen (TYLENOL) 325 MG tablet Take 2 tablets (650 mg total) by mouth every 4 (four) hours as needed for mild pain. 11/21/15   Neldon Labella, NP  albuterol (PROVENTIL) (2.5 MG/3ML) 0.083% nebulizer solution Take 2.5 mg by nebulization every 6 (six) hours as needed for wheezing or shortness of breath.     Historical Provider, MD  allopurinol (ZYLOPRIM) 100 MG tablet Take 100 mg by mouth 2 (two) times daily as needed (for gout flares).  06/28/14   Historical  Provider, MD  carvedilol (COREG) 6.25 MG tablet Take 6.25 mg by mouth 2 (two) times daily with a meal.    Historical Provider, MD  colchicine 0.6 MG tablet Take 1 tablet (0.6 mg total) by mouth 2 (two) times daily. Patient taking differently: Take 0.6 mg by mouth 2 (two) times daily as needed (for gout flares).  04/21/14   Junius Creamer, NP  ELIQUIS 5 MG TABS tablet Take 5 mg by mouth 2 (two) times daily.  04/01/16   Historical Provider, MD  feeding supplement, GLUCERNA SHAKE, (GLUCERNA SHAKE) LIQD Take 237 mLs by mouth daily. 05/12/16   Clanford Marisa Hua, MD  furosemide (LASIX) 20 MG tablet Take 1 tablet (20 mg total) by mouth every other day. 05/13/16   Clanford Marisa Hua, MD  magnesium oxide (MAG-OX) 400 MG tablet Take 200 mg by mouth daily.    Historical Provider, MD  Melatonin 3 MG TABS Take 3 mg by mouth at bedtime as needed (sleep).     Historical Provider, MD  metFORMIN (GLUCOPHAGE) 1000 MG tablet Take 0.5 tablets (500 mg total) by mouth 2 (two) times daily with a meal. 11/22/15  Adrian Prows, MD  mirtazapine (REMERON) 15 MG tablet Take 15 mg by mouth at bedtime.    Historical Provider, MD  Multiple Vitamins-Minerals (OCUVITE PRESERVISION PO) Take 1 tablet by mouth daily.    Historical Provider, MD  pantoprazole (PROTONIX) 40 MG tablet Take 1 tablet (40 mg total) by mouth 2 (two) times daily. 07/22/14   Charlynne Cousins, MD  simvastatin (ZOCOR) 40 MG tablet Take 40 mg by mouth every evening.    Historical Provider, MD  tiotropium (SPIRIVA) 18 MCG inhalation capsule Place 18 mcg into inhaler and inhale daily.    Historical Provider, MD  vitamin B-12 (CYANOCOBALAMIN) 1000 MCG tablet Take 1,000 mcg by mouth daily.    Historical Provider, MD    Family History Family History  Problem Relation Age of Onset  . Hypertension Other   . Cancer Neg Hx   . CAD Neg Hx     Social History Social History  Substance Use Topics  . Smoking status: Former Smoker    Packs/day: 3.00    Years: 35.00     Types: Cigarettes    Quit date: 09/23/1981  . Smokeless tobacco: Never Used  . Alcohol use Yes     Comment: QUITS YEARS AGO     Allergies   No known allergies   Review of Systems Review of Systems  Constitutional: Positive for chills. Negative for fever.  HENT: Negative for postnasal drip, rhinorrhea, sinus pressure, sneezing and sore throat.   Respiratory: Positive for cough. Negative for shortness of breath.   Cardiovascular: Negative for chest pain.  Gastrointestinal: Negative for abdominal pain, constipation, diarrhea, nausea and vomiting.  Genitourinary: Negative for dysuria.  All other systems reviewed and are negative.    Physical Exam Updated Vital Signs BP 117/73 (BP Location: Right Arm)   Pulse (!) 52   Temp 98.5 F (36.9 C) (Oral)   Resp 18   Wt 62.9 kg   SpO2 90%   BMI 19.33 kg/m   Physical Exam  Constitutional: He is oriented to person, place, and time. He appears well-developed and well-nourished.  HENT:  Head: Normocephalic and atraumatic.  Eyes: Conjunctivae and EOM are normal. Pupils are equal, round, and reactive to light. Right eye exhibits no discharge. Left eye exhibits no discharge. No scleral icterus.  Neck: Normal range of motion. Neck supple. No JVD present.  Cardiovascular: Normal rate, regular rhythm and normal heart sounds.  Exam reveals no gallop and no friction rub.   No murmur heard. Pulmonary/Chest: Effort normal and breath sounds normal. No respiratory distress. He has no wheezes. He has no rales. He exhibits no tenderness.  Left-sided crackles  Abdominal: Soft. He exhibits no distension and no mass. There is no tenderness. There is no rebound and no guarding.  Musculoskeletal: Normal range of motion. He exhibits no edema or tenderness.  Neurological: He is alert and oriented to person, place, and time.  Skin: Skin is warm and dry.  Psychiatric: He has a normal mood and affect. His behavior is normal. Judgment and thought content  normal.  Nursing note and vitals reviewed.    ED Treatments / Results  Labs (all labs ordered are listed, but only abnormal results are displayed) Labs Reviewed  CBC WITH DIFFERENTIAL/PLATELET - Abnormal; Notable for the following:       Result Value   RBC 3.29 (*)    Hemoglobin 10.7 (*)    HCT 33.2 (*)    MCV 100.9 (*)    Monocytes Absolute 1.2 (*)  All other components within normal limits  COMPREHENSIVE METABOLIC PANEL - Abnormal; Notable for the following:    Glucose, Bld 120 (*)    BUN 38 (*)    Calcium 8.6 (*)    Albumin 3.0 (*)    ALT 11 (*)    All other components within normal limits  CBG MONITORING, ED - Abnormal; Notable for the following:    Glucose-Capillary 126 (*)    All other components within normal limits  I-STAT TROPOININ, ED    EKG  EKG Interpretation  Date/Time:  Friday July 12 2016 17:02:50 EDT Ventricular Rate:  91 PR Interval:    QRS Duration: 66 QT Interval:  338 QTC Calculation: 415 R Axis:   99 Text Interpretation:  Atrial fibrillation with premature ventricular or aberrantly conducted complexes Rightward axis Low voltage QRS Septal infarct , age undetermined Abnormal ECG Confirmed by Alvino Chapel  MD, NATHAN [phone]) on 07/12/2016 7:20:17 PM       Radiology Dg Chest 2 View  Result Date: 07/12/2016 CLINICAL DATA:  Shortness of breath and dizziness. Failure to thrive. Coughing. EXAM: CHEST  2 VIEW COMPARISON:  05/11/2016 and multiple previous FINDINGS: Heart size is normal. There is aortic atherosclerosis. There is a newly seen left effusion with left lower lobe infiltrate and volume loss. Mild chronic pleural blunting on the right appears the same. Chronic calcified granuloma in the right lower lung is the same. Upper lobe emphysema again noted. IMPRESSION: Development of a left effusion with left lower lobe atelectasis and/or pneumonia. Electronically Signed   By: Nelson Chimes M.D.   On: 07/12/2016 17:53    Procedures Procedures  (including critical care time)  Medications Ordered in ED Medications - No data to display   Initial Impression / Assessment and Plan / ED Course  I have reviewed the triage vital signs and the nursing notes.  Pertinent labs & imaging results that were available during my care of the patient were reviewed by me and considered in my medical decision making (see chart for details).  Clinical Course    Patient with worsening cough 3 weeks, productive brown sputum, subjective chills, admitted for pneumonia 2 months ago. Chest x-ray is consistent with left-sided pneumonia. This fits with the patient's presentation and symptoms. Given that he was admitted for the same 2 months ago, we will need to be covered for healthcare associated pneumonia. Will start vancomycin and cefepime. Patient will require admission to the hospital.  Patient seen by and discussed with Dr. Alvino Chapel, who agrees with plan.  Appreciate Dr. Shanon Brow for admitting the patient.  Sputum cultures ordered.  Final Clinical Impressions(s) / ED Diagnoses   Final diagnoses:  HCAP (healthcare-associated pneumonia)    New Prescriptions New Prescriptions   No medications on file     Zaelen Haberland, PA-C 07/12/16 2112    Davonna Belling, MD 07/12/16 [id number]    Davonna Belling, MD 07/12/16 2321

## 2016-07-13 DIAGNOSIS — F0390 Unspecified dementia without behavioral disturbance: Secondary | ICD-10-CM | POA: Diagnosis present

## 2016-07-13 DIAGNOSIS — L8990 Pressure ulcer of unspecified site, unspecified stage: Secondary | ICD-10-CM | POA: Insufficient documentation

## 2016-07-13 LAB — BASIC METABOLIC PANEL
ANION GAP: 9 (ref 5–15)
BUN: 35 mg/dL — AB (ref 6–20)
CHLORIDE: 106 mmol/L (ref 101–111)
CO2: 26 mmol/L (ref 22–32)
Calcium: 8.3 mg/dL — ABNORMAL LOW (ref 8.9–10.3)
Creatinine, Ser: 0.89 mg/dL (ref 0.61–1.24)
GFR calc Af Amer: >60 mL/min (ref >60)
GFR calc non Af Amer: >60 mL/min (ref >60)
GLUCOSE: 107 mg/dL — AB (ref 65–99)
POTASSIUM: 4.2 mmol/L (ref 3.5–5.1)
Sodium: 141 mmol/L (ref 135–145)

## 2016-07-13 LAB — CBC WITH DIFFERENTIAL/PLATELET
Basophils Absolute: 0 10*3/uL (ref 0.0–0.1)
Basophils Relative: 0 %
EOS PCT: 4 %
Eosinophils Absolute: 0.2 10*3/uL (ref 0.0–0.7)
HEMATOCRIT: 30.7 % — AB (ref 39.0–52.0)
Hemoglobin: 9.8 g/dL — ABNORMAL LOW (ref 13.0–17.0)
LYMPHS ABS: 1.7 10*3/uL (ref 0.7–4.0)
LYMPHS PCT: 36 %
MCH: 32.1 pg (ref 26.0–34.0)
MCHC: 31.9 g/dL (ref 30.0–36.0)
MCV: 100.7 fL — AB (ref 78.0–100.0)
MONO ABS: 0.9 10*3/uL (ref 0.1–1.0)
Monocytes Relative: 20 %
NEUTROS ABS: 1.9 10*3/uL (ref 1.7–7.7)
Neutrophils Relative %: 40 %
PLATELETS: 160 10*3/uL (ref 150–400)
RBC: 3.05 MIL/uL — AB (ref 4.22–5.81)
RDW: 14.9 % (ref 11.5–15.5)
WBC: 4.7 10*3/uL (ref 4.0–10.5)

## 2016-07-13 LAB — EXPECTORATED SPUTUM ASSESSMENT W REFEX TO RESP CULTURE

## 2016-07-13 LAB — INFLUENZA PANEL BY PCR (TYPE A & B)
H1N1 flu by pcr: NOT DETECTED
INFLAPCR: NEGATIVE
Influenza B By PCR: NEGATIVE

## 2016-07-13 LAB — EXPECTORATED SPUTUM ASSESSMENT W GRAM STAIN, RFLX TO RESP C

## 2016-07-13 LAB — STREP PNEUMONIAE URINARY ANTIGEN: Strep Pneumo Urinary Antigen: NEGATIVE

## 2016-07-13 LAB — LACTIC ACID, PLASMA: Lactic Acid, Venous: 1.2 mmol/L (ref 0.5–1.9)

## 2016-07-13 LAB — PROCALCITONIN: Procalcitonin: <0.1 ng/mL

## 2016-07-13 LAB — VITAMIN B12: Vitamin B-12: 2439 pg/mL — ABNORMAL HIGH (ref 180–914)

## 2016-07-13 MED ORDER — ACETYLCYSTEINE 20 % IN SOLN
3.0000 mL | Freq: Four times a day (QID) | RESPIRATORY_TRACT | Status: DC
  Administered 2016-07-14 – 2016-07-15 (×6): 3 mL via RESPIRATORY_TRACT
  Administered 2016-07-15: 4 mL via RESPIRATORY_TRACT
  Administered 2016-07-15 – 2016-07-16 (×2): 3 mL via RESPIRATORY_TRACT
  Filled 2016-07-13 (×10): qty 4

## 2016-07-13 MED ORDER — ACETYLCYSTEINE 20 % IN SOLN
3.0000 mL | RESPIRATORY_TRACT | Status: DC
  Administered 2016-07-13: 3 mL via RESPIRATORY_TRACT
  Filled 2016-07-13 (×4): qty 4

## 2016-07-13 MED ORDER — ACETAMINOPHEN 325 MG PO TABS: 650.0000 mg | ORAL_TABLET | Freq: Four times a day (QID) | ORAL | Status: DC | PRN

## 2016-07-13 MED ORDER — ACETYLCYSTEINE 20 % IN SOLN
3.0000 mL | RESPIRATORY_TRACT | Status: DC
  Administered 2016-07-13: 3 mL via RESPIRATORY_TRACT
  Filled 2016-07-13 (×5): qty 4

## 2016-07-13 MED ORDER — ALBUTEROL SULFATE (2.5 MG/3ML) 0.083% IN NEBU
2.5000 mg | INHALATION_SOLUTION | Freq: Four times a day (QID) | RESPIRATORY_TRACT | Status: DC
  Administered 2016-07-14 – 2016-07-16 (×9): 2.5 mg via RESPIRATORY_TRACT
  Filled 2016-07-13 (×9): qty 3

## 2016-07-13 MED ORDER — LORAZEPAM 2 MG/ML IJ SOLN
0.5000 mg | Freq: Once | INTRAMUSCULAR | Status: AC
Start: 2016-07-13 — End: 2016-07-13
  Administered 2016-07-13: 0.5 mg via INTRAVENOUS
  Filled 2016-07-13: qty 1

## 2016-07-13 MED ORDER — ENSURE ENLIVE PO LIQD
237.0000 mL | Freq: Three times a day (TID) | ORAL | Status: DC
Start: 2016-07-13 — End: 2016-07-13

## 2016-07-13 MED ORDER — METHYLPREDNISOLONE SODIUM SUCC 125 MG IJ SOLR
60.0000 mg | Freq: Two times a day (BID) | INTRAMUSCULAR | Status: DC
  Administered 2016-07-13 – 2016-07-15 (×4): 60 mg via INTRAVENOUS
  Filled 2016-07-13 (×5): qty 2

## 2016-07-13 MED ORDER — MECLIZINE HCL 25 MG PO TABS
25.0000 mg | ORAL_TABLET | Freq: Once | ORAL | Status: DC
  Filled 2016-07-13: qty 1

## 2016-07-13 MED ORDER — ALBUTEROL SULFATE (2.5 MG/3ML) 0.083% IN NEBU
2.5000 mg | INHALATION_SOLUTION | RESPIRATORY_TRACT | Status: DC
Start: 2016-07-13 — End: 2016-07-13
  Administered 2016-07-13 (×3): 2.5 mg via RESPIRATORY_TRACT
  Filled 2016-07-13 (×2): qty 3

## 2016-07-13 NOTE — Progress Notes (Addendum)
PROGRESS NOTE [facility]   [name]   [id number] DOB: [birth year]  DOA: 07/12/2016 PCP: [name], MD   Brief Narrative: [name] 82 y/o Male with PMHx, Dementia, COPD, Afib, HTN, CAD, CVA, DM type 2 and PVD presents to the emergency department with chief complaint of cough and shortness of breath. He reported that he has had worsening cough for the past 3 weeks. He denies any fevers, but does report having chills. Patient is accompanied by family members, state that he has had worsening cough with productive thick brown sputum. Patient is normally on 2 L of oxygen at home, and has been saturating about 92%. Patient also complains of left sided mid back pain. He was admitted 2 months ago for pneumonia. In ED was found to have left lower pneumonia with effusion. Patient admitted for treatment of pneumonia and swallow eval.   Subjective:  Patient seen and examined this morning, somewhat sleepy but responsive to verbal stimulus. Patient oriented only to person. Denies chest pain, SOB and dizziness. No acute events overnight.   Assessment & Plan:    Healthcare-associated pneumonia - could be also related to aspiration -Continue abx treatment  -Continue Nebs  -Follow up cultures  -Will add mucomyst -Swallow eval pending  -Continue NPO for now  -Monitor O2 sats, keep above 92% oxygen supplements as needed  -Will add steroids given COPD component  -Get proclacitonin  -Repeat Lactic acid  COPD (chronic obstructive pulmonary disease) ([place]) -See above   Atrial fibrillation (HCC) - HR controlled - CHADVASC 7 - on Eliquis  -Continue BB and AC -Monitor on Tele   History of CVA  -Continue home meds  Dysphagia - likely causing aspiration  -NPO  -Swallow eval  DM - stable  SSI   Dementia  -Continue home meds  Pressure injury of skin -Frequent turns -Consider wound care consult   DVT prophylaxis: Eliquis Code Status: Full  Family Communication: None  at bedside Disposition Plan: Anticipate discharge to previous environment when medically stable.    Consultants:   None   Procedures:   None   Antimicrobials:  Cefepime 10/20  Vanco 10/20   Objective: Vitals:   07/12/16 2115 07/12/16 2157 07/13/16 0452 07/13/16 0947  BP: 141/95 (!) 141/96 113/68   Pulse: 71 70 (!) 59   Resp: 21 (!) 22 16   Temp:  98.5 F (36.9 C) 98.2 F (36.8 C)   TempSrc:  Oral    SpO2: 99% 98% 99% 99%  Weight:  63 kg (139 lb)    Height:  5\' 7"  (1.702 m)      Intake/Output Summary (Last 24 hours) at 07/13/16 1136 Last data filed at 07/13/16 0626  Gross per 24 hour  Intake               50 ml  Output              250 ml  Net             -200 ml   Filed Weights   07/12/16 1727 07/12/16 2157  Weight: 62.9 kg (138 lb 9 oz) 63 kg (139 lb)    Examination:  General exam: Appears calm and comfortable, confuse  Respiratory system: decrease air entry, diffuse late expiratory wheezing, upper lobes rhonchi.  Cardiovascular system: S1 & S2 heard, RRR. No JVD, murmurs, rubs or gallops Gastrointestinal system: Abdomen is nondistended, soft and nontender. No organomegaly or masses felt.  Central nervous system: CN grossly intact  Extremities: No pedal edema. Symmetric Skin: No rashes or lesions  Psychiatry: Judgement and insight appear normal. Mood & affect appropriate.    Data Reviewed: I have personally reviewed following labs and imaging studies  CBC:  Recent Labs Lab 07/12/16 1708 07/13/16 0605  WBC 7.0 4.7  NEUTROABS 3.9 1.9  HGB 10.7* 9.8*  HCT 33.2* 30.7*  MCV 100.9* 100.7*  PLT 189 0000000   Basic Metabolic Panel:  Recent Labs Lab 07/12/16 1708 07/13/16 0605  NA 138 141  K 4.6 4.2  CL 103 106  CO2 27 26  GLUCOSE 120* 107*  BUN 38* 35*  CREATININE 1.01 0.89  CALCIUM 8.6* 8.3*   GFR: Estimated Creatinine Clearance: 53.2 mL/min (by C-G formula based on SCr of 0.89 mg/dL). Liver Function Tests:  Recent Labs Lab  07/12/16 1708  AST 20  ALT 11*  ALKPHOS 72  BILITOT 0.5  PROT 6.6  ALBUMIN 3.0*   No results for input(s): LIPASE, AMYLASE in the last 168 hours. No results for input(s): AMMONIA in the last 168 hours. Coagulation Profile: No results for input(s): INR, PROTIME in the last 168 hours. Cardiac Enzymes: No results for input(s): CKTOTAL, CKMB, CKMBINDEX, TROPONINI in the last 168 hours. BNP (last 3 results) No results for input(s): PROBNP in the last 8760 hours. HbA1C: No results for input(s): HGBA1C in the last 72 hours. CBG:  Recent Labs Lab 07/12/16 1707  GLUCAP 126*   Lipid Profile: No results for input(s): CHOL, HDL, LDLCALC, TRIG, CHOLHDL, LDLDIRECT in the last 72 hours. Thyroid Function Tests: No results for input(s): TSH, T4TOTAL, FREET4, T3FREE, THYROIDAB in the last 72 hours. Anemia Panel: No results for input(s): VITAMINB12, FOLATE, FERRITIN, TIBC, IRON, RETICCTPCT in the last 72 hours. Sepsis Labs:  Recent Labs Lab 07/12/16 2040  LATICACIDVEN 1.91*    Recent Results (from the past 240 hour(s))  Culture, expectorated sputum-assessment     Status: None   Collection Time: 07/13/16  4:30 AM  Result Value Ref Range Status   Specimen Description EXPECTORATED SPUTUM  Final   Special Requests NONE  Final   Sputum evaluation   Final    THIS SPECIMEN IS ACCEPTABLE. RESPIRATORY CULTURE REPORT TO FOLLOW.   Report Status 07/13/2016 FINAL  Final  Culture, respiratory (NON-Expectorated)     Status: None (Preliminary result)   Collection Time: 07/13/16  4:30 AM  Result Value Ref Range Status   Specimen Description SPUTUM  Final   Special Requests NONE  Final   Gram Stain   Final    NO WBC SEEN FEW SQUAMOUS EPITHELIAL CELLS PRESENT FEW GRAM POSITIVE COCCI IN PAIRS RARE GRAM POSITIVE RODS RARE GRAM NEGATIVE RODS    Culture PENDING  Incomplete   Report Status PENDING  Incomplete         Radiology Studies: Dg Chest 2 View  Result Date: 07/12/2016 CLINICAL  DATA:  Shortness of breath and dizziness. Failure to thrive. Coughing. EXAM: CHEST  2 VIEW COMPARISON:  05/11/2016 and multiple previous FINDINGS: Heart size is normal. There is aortic atherosclerosis. There is a newly seen left effusion with left lower lobe infiltrate and volume loss. Mild chronic pleural blunting on the right appears the same. Chronic calcified granuloma in the right lower lung is the same. Upper lobe emphysema again noted. IMPRESSION: Development of a left effusion with left lower lobe atelectasis and/or pneumonia. Electronically Signed   By: Nelson Chimes M.D.   On: 07/12/2016 17:53      Scheduled Meds: . apixaban  5 mg Oral BID  . carvedilol  6.25 mg Oral BID WC  . ceFEPime (MAXIPIME) IV  1 g Intravenous Q8H  . meclizine  25 mg Oral Once  . mirtazapine  7.5-15 mg Oral QHS  . sodium chloride flush  3 mL Intravenous Q12H  . tiotropium  18 mcg Inhalation Daily  . vancomycin  500 mg Intravenous Q12H   Continuous Infusions:    LOS: 1 day    Chipper Oman, MD [facility] Pager [phone]  If 7PM-7AM, please contact night-coverage [email or web address] Password [email or web address] 07/13/2016, 11:36 AM

## 2016-07-13 NOTE — Progress Notes (Addendum)
Initial Nutrition Assessment  DOCUMENTATION CODES:   Severe malnutrition in context of chronic illness  INTERVENTION:    When diet advanced, add Ensure Enlive supplements between meals, each supplement provides 350 kcal and 20 grams of protein  NUTRITION DIAGNOSIS:   Malnutrition related to chronic illness as evidenced by severe depletion of muscle mass, severe depletion of body fat.  GOAL:   Patient will meet greater than or equal to 90% of their needs  MONITOR:   PO intake, Supplement acceptance, Skin, I & O's  REASON FOR ASSESSMENT:   Malnutrition Screening Tool    ASSESSMENT:   82 y.o. male with medical history significant of pna in august treated with levaquin, COPD, on prn oxgyen at 2 liters [place],  CAD comes in with 3 weeks of worsening sob and productive cough. Hx of CVA and aspiration. Admitted with HCAP.   Patient confused during RD visit. He was unable to answer any nutrition hx questions. Suspect he was eating poorly PTA, given confusion and PNA. Currently NPO.  Nutrition-Focused physical exam completed. Findings are severe fat depletion, severe muscle depletion, and no edema.  Patient with severe PCM.  Labs reviewed. Medications reviewed and include Remeron.  Diet Order:  Diet NPO time specified  Skin:  Wound (see comment) (stage II pressure injury to heel; foot lacerations)  Last BM:  10/19  Height:   Ht Readings from Last 1 Encounters:  07/12/16 5\' 7"  (1.702 m)    Weight:   Wt Readings from Last 1 Encounters:  07/12/16 139 lb (63 kg)    Ideal Body Weight:  67.3 kg  BMI:  Body mass index is 21.77 kg/m.  Estimated Nutritional Needs:   Kcal:  1700-1900  Protein:  80-90 gm  Fluid:  >/= 1.8 L  EDUCATION NEEDS:   No education needs identified at this time  Molli Barrows, [place], [place], [place] Pager [phone] After Hours Pager [phone]

## 2016-07-13 NOTE — Progress Notes (Addendum)
Pt is npo. For night meds, MD was notified and he put in Ice chips. Will continue to monitor.   Pt says he's having some headache and feeling dizzy. MD notified. No new orders yet. Will continue to monitor. MD called back and put in Meclizine once per verbal order. Will  Continue to monitor.  Pt did not tolerate water well this AM.Unable to saely give the Antivert pill. MD notified.  MD put in ativan for pt. Will administer and continue to monitor.

## 2016-07-13 NOTE — Progress Notes (Signed)
SLP Cancellation Note  Patient Details Name: DEZMEND GORALCZYK MRN: [id number] DOB: [birth year]   Cancelled treatment:       Reason Eval/Treat Not Completed: Patient's level of consciousness. Patient sleeping with mouth open and awoke to tell clinician: "I'm tired and wore out". Patient was not able to be fully awakened/aroused to perform BSE.   Sonia Baller, MA, CCC-SLP 07/13/16 2:29 PM

## 2016-07-14 ENCOUNTER — Inpatient Hospital Stay (HOSPITAL_COMMUNITY): Payer: Medicare Other

## 2016-07-14 LAB — CBC WITH DIFFERENTIAL/PLATELET
BASOS ABS: 0 10*3/uL (ref 0.0–0.1)
Basophils Relative: 0 %
EOS PCT: 0 %
Eosinophils Absolute: 0 10*3/uL (ref 0.0–0.7)
HCT: 38.6 % — ABNORMAL LOW (ref 39.0–52.0)
Hemoglobin: 11.9 g/dL — ABNORMAL LOW (ref 13.0–17.0)
LYMPHS PCT: 13 %
Lymphs Abs: 1 10*3/uL (ref 0.7–4.0)
MCH: 32.2 pg (ref 26.0–34.0)
MCHC: 30.8 g/dL (ref 30.0–36.0)
MCV: 104.3 fL — AB (ref 78.0–100.0)
Monocytes Absolute: 0.5 10*3/uL (ref 0.1–1.0)
Monocytes Relative: 7 %
NEUTROS ABS: 6.3 10*3/uL (ref 1.7–7.7)
Neutrophils Relative %: 80 %
PLATELETS: 200 10*3/uL (ref 150–400)
RBC: 3.7 MIL/uL — AB (ref 4.22–5.81)
RDW: 15.3 % (ref 11.5–15.5)
WBC: 7.9 10*3/uL (ref 4.0–10.5)

## 2016-07-14 LAB — APTT: aPTT: 37 seconds — ABNORMAL HIGH (ref 24–36)

## 2016-07-14 LAB — BASIC METABOLIC PANEL
ANION GAP: 11 (ref 5–15)
BUN: 47 mg/dL — AB (ref 6–20)
CO2: 22 mmol/L (ref 22–32)
Calcium: 8.6 mg/dL — ABNORMAL LOW (ref 8.9–10.3)
Chloride: 105 mmol/L (ref 101–111)
Creatinine, Ser: 1.3 mg/dL — ABNORMAL HIGH (ref 0.61–1.24)
GFR calc Af Amer: 56 mL/min — ABNORMAL LOW (ref >60)
GFR, EST NON AFRICAN AMERICAN: 48 mL/min — AB (ref >60)
Glucose, Bld: 145 mg/dL — ABNORMAL HIGH (ref 65–99)
POTASSIUM: 6 mmol/L — AB (ref 3.5–5.1)
SODIUM: 138 mmol/L (ref 135–145)

## 2016-07-14 LAB — VANCOMYCIN, TROUGH: VANCOMYCIN TR: 22 ug/mL — AB (ref 15–20)

## 2016-07-14 MED ORDER — DEXTROSE-NACL 5-0.45 % IV SOLN
INTRAVENOUS | Status: DC
  Administered 2016-07-14 – 2016-07-15 (×2): via INTRAVENOUS
  Administered 2016-07-15: 1000 mL via INTRAVENOUS
  Administered 2016-07-16: 21:00:00 via INTRAVENOUS

## 2016-07-14 MED ORDER — VANCOMYCIN HCL IN DEXTROSE 750-5 MG/150ML-% IV SOLN
750.0000 mg | INTRAVENOUS | Status: DC
  Administered 2016-07-15: 750 mg via INTRAVENOUS
  Filled 2016-07-14: qty 150

## 2016-07-14 MED ORDER — DEXTROSE-NACL 5-0.45 % IV SOLN: INTRAVENOUS | Status: DC

## 2016-07-14 MED ORDER — HEPARIN (PORCINE) IN NACL 100-0.45 UNIT/ML-% IJ SOLN
1000.0000 [IU]/h | INTRAMUSCULAR | Status: DC
  Administered 2016-07-14: 850 [IU]/h via INTRAVENOUS
  Administered 2016-07-15: 950 [IU]/h via INTRAVENOUS
  Administered 2016-07-16: 1050 [IU]/h via INTRAVENOUS
  Filled 2016-07-14 (×3): qty 250

## 2016-07-14 MED ORDER — VANCOMYCIN HCL IN DEXTROSE 750-5 MG/150ML-% IV SOLN: 750.0000 mg | INTRAVENOUS | Status: DC

## 2016-07-14 MED ORDER — DEXTROSE 5 % IV SOLN
2.0000 g | INTRAVENOUS | Status: DC
  Administered 2016-07-15 – 2016-07-17 (×3): 2 g via INTRAVENOUS
  Filled 2016-07-14 (×3): qty 2

## 2016-07-14 NOTE — Progress Notes (Signed)
Patient looked to be in distress this morning. I paged the MD on call and told him he had assessory breathing, his late heart rate was 43, lower extremities are cool to touch. Dr Maudie Mercury ordered an ABG and chest Xray, which has been done.  Rapid response was made aware of his situation.

## 2016-07-14 NOTE — Progress Notes (Signed)
Pharmacy Antibiotic Note  [name] is a 82 y.o. male admitted on 07/12/2016 with pneumonia.  Pharmacy has been consulted for vancomycin dosing. CC SOB and dizziness. Has 3 week hx of cough. Vancomycin 500mg  IV q12 Cr slight bump 0.8 > 1.3 VT slightly above goal at 22, WBC wnl, afebrile  Plan: Decrease vancomycin 750mg  IV q24hr Cefepime 1g IV x1 Monitor clinical course and renal function  Height: 5\' 7"  (170.2 cm) Weight: 139 lb (63 kg) IBW/kg (Calculated) : 66.1  Temp (24hrs), Avg:97.6 F (36.4 C), Min:97.5 F (36.4 C), Max:97.7 F (36.5 C)   Recent Labs Lab 07/12/16 1708 07/12/16 2040 07/13/16 0605 07/13/16 1437 07/14/16 0625 07/14/16 2014  WBC 7.0  --  4.7  --  7.9  --   CREATININE 1.01  --  0.89  --  1.30*  --   LATICACIDVEN  --  1.91*  --  1.2  --   --   VANCOTROUGH  --   --   --   --   --  22*    Estimated Creatinine Clearance: 36.4 mL/min (by C-G formula based on SCr of 1.3 mg/dL (H)).  Clearance stable   Allergies  Allergen Reactions  . No Known Allergies     Antimicrobials this admission: Vancomycin 10/20 >> Cefepime 10/20 >>  Dose adjustments this admission: N/A  Microbiology results: 10/20 BCx: Sent  Bonnita Nasuti Pharm.D. CPP, BCPS Clinical Pharmacist [phone] 07/14/2016 10:16 PM

## 2016-07-14 NOTE — Evaluation (Signed)
Clinical/Bedside Swallow Evaluation Patient Details  Name: [name] MRN: [id number] Date of Birth: 03/16/1928  Today's Date: 07/14/2016 Time: SLP Start Time (ACUTE ONLY): 1010 SLP Stop Time (ACUTE ONLY): 1020 SLP Time Calculation (min) (ACUTE ONLY): 10 min  Past Medical History:  Past Medical History:  Diagnosis Date  . Anemia   . Arthritis    "legs" (02/24/2014)  . Cellulitis and abscess of toe 08/2015   LEFT FOOT   . Claudication of calf muscles ([place]) 11/05/2012   right calf  . Constipation   . COPD (chronic obstructive pulmonary disease) ([place])   . Coronary artery disease   . Dysrhythmia    atrial fibrilation  . GERD (gastroesophageal reflux disease)   . Hypertension   . Macular degeneration, bilateral    "has had shots in both eyes"  . Neuromuscular disorder (HCC)    dizziness  . On home oxygen therapy    "2L; w/activity" (02/24/2014)  . Peripheral vascular disease ([place])   . Pneumonia    "5 times back to back recently (just finished antibiiotic) " (02/24/2014)  . Shortness of breath    with exertion  . Stroke ([place]) 05/2013   "can't use left arm fully since"  . TIA (transient ischemic attack) 2010  . Type II diabetes mellitus ([place])    Past Surgical History:  Past Surgical History:  Procedure Laterality Date  . AMPUTATION Left 09/12/2015   Procedure: LEFT GREAT TOE AMPUTATION;  Surgeon: Meredith Pel, MD;  Location: [place];  Service: Orthopedics;  Laterality: Left;  . AMPUTATION Left 04/17/2016   Procedure: 2nd Toe Amputation Left Foot;  Surgeon: [name], MD;  Location: [place];  Service: Orthopedics;  Laterality: Left;  . CARDIAC CATHETERIZATION    . CHEST TUBE INSERTION Left 02/24/2014   Procedure: INSERTION PLEURAL DRAINAGE CATHETER;  Surgeon: Ivin Poot, MD;  Location: [place];  Service: Thoracic;  Laterality: Left;  . COLONOSCOPY    . ESOPHAGOGASTRODUODENOSCOPY N/A 11/02/2013   Procedure: ESOPHAGOGASTRODUODENOSCOPY (EGD);  Surgeon: Cleotis Nipper,  MD;  Location[facility] ENDOSCOPY;  Service: Endoscopy;  Laterality: N/A;  . ESOPHAGOGASTRODUODENOSCOPY (EGD) WITH PROPOFOL N/A 07/06/2014   Procedure: ESOPHAGOGASTRODUODENOSCOPY (EGD) WITH PROPOFOL;  Surgeon: Cleotis Nipper, MD;  Location: [place];  Service: Endoscopy;  Laterality: N/A;  . FEMORAL ARTERY STENT Left 10/2012  . FOOT SURGERY Left    due to broken foot years ago  . LOWER EXTREMITY ANGIOGRAM N/A 11/06/2012   Procedure: LOWER EXTREMITY ANGIOGRAM;  Surgeon: Laverda Page, MD;  Location: [facility] CATH LAB;  Service: Cardiovascular;  Laterality: N/A;  . PERIPHERAL VASCULAR CATHETERIZATION N/A 11/20/2015   Procedure: Lower Extremity Angiography;  Surgeon: Adrian Prows, MD;  Location: [place] CV LAB;  Service: Cardiovascular;  Laterality: N/A;  . PERIPHERAL VASCULAR CATHETERIZATION  11/20/2015   Procedure: Peripheral Vascular Atherectomy;  Surgeon: Adrian Prows, MD;  Location: [place] CV LAB;  Service: Cardiovascular;;  . REMOVAL OF PLEURAL DRAINAGE CATHETER Left 05/03/2014   Procedure: REMOVAL OF PLEURAL DRAINAGE CATHETER;  Surgeon: Ivin Poot, MD;  Location: [place];  Service: Thoracic;  Laterality: Left;  . TRANSURETHRAL RESECTION OF PROSTATE    . VASCULAR SURGERY     stents to legs   HPI:  [name] 82 y/o Male with PMHx, Dementia, COPD, Afib, HTN, CAD, CVA, DM type 2 and PVD presents to the emergency department with chief complaint of cough and shortness of breath. He reportedthat he has had worsening cough for the past 3  weeks. He denies any fevers, but does report having chills. Patient is accompanied by family members, state that he has had worsening cough with productive thick brown sputum. Patient is normally on 2 L of oxygen at home, and has been saturating about 92%. Patient also complains of left sided mid back pain. He was admitted 2 months ago for pneumonia. In ED was found to have left lower pneumonia witheffusion. Patient admitted for treatment of pneumonia and  swallow eval. Pt has a history of dysphagia and has been found to have high risk risk of aspriation with all textures due to silent aspiration with large boluses and aspiration of residuals. He has been recommended to take small single sips and swallow twice, recommendations he has had before and has not complied with.    Assessment / Plan / Recommendation Clinical Impression  Pt demonstrates signs of severe, chronic dysphagia complicated by dementia. Pt known to aspirate all textures on prior MBS in 2015 and 2016 and has been recommended to conume a regular texture diet with assistance to follow precautions of small single sips and a second swallow, with family informed of risk. Today pt is agitated upon awakening, resistant to any cues or assessment, threatened to throw water in my face. Overt signs of aspiration with all sips. SLP could not regulate bolus size or cue a second swallow. Function appears worse than in prior assessments. Pt will not be safe with any PO, so diet orders are not compatible with current order as a full code. Recommend MD discuss plan of care with family or refer pt to Palliative care to initiate comfort feeding with known risk of aspiration. Will f/u for plan.     Aspiration Risk  Severe aspiration risk    Diet Recommendation NPO        Other  Recommendations Oral Care Recommendations: Oral care QID   Follow up Recommendations        Frequency and Duration            Prognosis        Swallow Study   General HPI: [name] 82 y/o Male with PMHx, Dementia, COPD, Afib, HTN, CAD, CVA, DM type 2 and PVD presents to the emergency department with chief complaint of cough and shortness of breath. He reportedthat he has had worsening cough for the past 3 weeks. He denies any fevers, but does report having chills. Patient is accompanied by family members, state that he has had worsening cough with productive thick brown sputum. Patient is normally on 2 L of  oxygen at home, and has been saturating about 92%. Patient also complains of left sided mid back pain. He was admitted 2 months ago for pneumonia. In ED was found to have left lower pneumonia witheffusion. Patient admitted for treatment of pneumonia and swallow eval. Pt has a history of dysphagia and has been found to have high risk risk of aspriation with all textures due to silent aspiration with large boluses and aspiration of residuals. He has been recommended to take small single sips and swallow twice, recommendations he has had before and has not complied with.  Type of Study: Bedside Swallow Evaluation Previous Swallow Assessment: MBS 2015, 2016 Diet Prior to this Study: NPO Temperature Spikes Noted: No Respiratory Status: Nasal cannula History of Recent Intubation: No Behavior/Cognition: Alert;Uncooperative;Doesn't follow directions Oral Cavity Assessment: Dry;Dried secretions Oral Care Completed by SLP: No Oral Cavity - Dentition: Missing dentition Self-Feeding Abilities: Able to feed self Patient Positioning: Postural control  interferes with function Baseline Vocal Quality: Normal Volitional Cough: Other (Comment) (refused to participate)    Oral/Motor/Sensory Function Overall Oral Motor/Sensory Function: Other (comment) (refused to participate)   Ice Chips     Thin Liquid Thin Liquid: Impaired Presentation: Cup;Self Fed Pharyngeal  Phase Impairments: Suspected delayed Swallow;Wet Vocal Quality;Cough - Immediate    Nectar Thick Nectar Thick Liquid: Not tested   Honey Thick Honey Thick Liquid: Not tested   Puree Puree:  (refused)   Solid   GO   Other Comments: refused       Herbie Baltimore, MA CCC-SLP [phone]  Shenea Giacobbe, Katherene Ponto 07/14/2016,10:34 AM

## 2016-07-14 NOTE — Progress Notes (Addendum)
PROGRESS NOTE [facility]   [name]   [id number] DOB: 28-Jul-1928  DOA: 07/12/2016 PCP: [name], MD   Brief Narrative: [name] 82 y/o Male with PMHx, Dementia, COPD, Afib, HTN, CAD, CVA, DM type 2 and PVD presents to the emergency department with chief complaint of cough and shortness of breath. He reported that he has had worsening cough for the past 3 weeks. He denies any fevers, but does report having chills. Patient is accompanied by family members, state that he has had worsening cough with productive thick brown sputum. Patient is normally on 2 L of oxygen at home, and has been saturating about 92%. Patient also complains of left sided mid back pain. He was admitted 2 months ago for pneumonia. In ED was found to have left lower pneumonia with effusion. Patient admitted for treatment of pneumonia and swallow eval.   Subjective:  Patient had an episode of increase on WOB overnight, CXR and ABG was done. This morning patient more awake and   Assessment & Plan:    Healthcare-associated pneumonia - could be also related to aspiration - failed swallow eval - high risk for aspiration  -Continue abx treatment  -Continue Nebs  -Follow up cultures -Continue NPO for now  -Monitor O2 sats, keep above 92% oxygen supplements as needed  -Continue steroids  -Procalcitonin <10   -Discussed with family regarding prognosis. This patient have overall poor prognosis, this patient will have high risk for requiring aspirations leading to recurrent pneumonias. Family inquiring regarding caloric intake. Options were discussed with family opted to think about placing a PEG tube on this patient. I discussed the patient should be evaluated  by palliative care for evaluation of goals of care.   COPD (chronic obstructive pulmonary disease) ([place]) -See above   Atrial fibrillation (HCC) - HR controlled - CHADVASC 7 - on Eliquis - not taking a/c due to nothing by mouth  status. -Start heparin drip for now -SCD -Continue BB and AC -Monitor on Tele   History of CVA  -Continue home meds  Dysphagia - likely causing aspiration - failed swallow eval, severe dysphagia -NPO   DM - stable  SSI   Dementia  -Continue home meds  Pressure injury of skin -Frequent turns -Consider wound care consult   DVT prophylaxis: Eliquis Code Status: Full  Family Communication: None at bedside Disposition Plan: Anticipate discharge to previous environment when medically stable.    Consultants:   None   Procedures:   None   Antimicrobials:  Cefepime 10/20  Vanco 10/20   Objective: Vitals:   07/14/16 0736 07/14/16 0945 07/14/16 1212 07/14/16 1511  BP: 109/74   114/73  Pulse: 84 77 73 (!) 53  Resp: 20 16 20  (!) 24  Temp: 97.5 F (36.4 C)   97.7 F (36.5 C)  TempSrc: Oral   Oral  SpO2: 95% 91% 93% 100%  Weight:      Height:        Intake/Output Summary (Last 24 hours) at 07/14/16 1518 Last data filed at 07/14/16 0913  Gross per 24 hour  Intake              150 ml  Output                0 ml  Net              150 ml   Filed Weights   07/12/16 1727 07/12/16 2157  Weight: 62.9 kg (138 lb 9 oz)  63 kg (139 lb)    Examination:  General exam: Appears calm and comfortable, confuse  Respiratory system: decrease air entry, upper lobes rhonchi. No wheezing Cardiovascular system: S1 & S2 heard, RRR. No JVD, murmurs, rubs or gallops Gastrointestinal system: Abdomen is nondistended, soft and nontender. No organomegaly or masses felt.  Central nervous system: CN grossly intact  Extremities: No pedal edema. Symmetric Skin: No rashes or lesions .    Data Reviewed: I have personally reviewed following labs and imaging studies  CBC:  Recent Labs Lab 07/12/16 1708 07/13/16 0605 07/14/16 0625  WBC 7.0 4.7 7.9  NEUTROABS 3.9 1.9 6.3  HGB 10.7* 9.8* 11.9*  HCT 33.2* 30.7* 38.6*  MCV 100.9* 100.7* 104.3*  PLT 189 160 [id number]   Basic Metabolic  Panel:  Recent Labs Lab 07/12/16 1708 07/13/16 0605 07/14/16 0625  NA 138 141 138  K 4.6 4.2 6.0*  CL 103 106 105  CO2 27 26 22   GLUCOSE 120* 107* 145*  BUN 38* 35* 47*  CREATININE 1.01 0.89 1.30*  CALCIUM 8.6* 8.3* 8.6*   GFR: Estimated Creatinine Clearance: 36.4 mL/min (by C-G formula based on SCr of 1.3 mg/dL (H)). Liver Function Tests:  Recent Labs Lab 07/12/16 1708  AST 20  ALT 11*  ALKPHOS 72  BILITOT 0.5  PROT 6.6  ALBUMIN 3.0*   No results for input(s): LIPASE, AMYLASE in the last 168 hours. No results for input(s): AMMONIA in the last 168 hours. Coagulation Profile: No results for input(s): INR, PROTIME in the last 168 hours. Cardiac Enzymes: No results for input(s): CKTOTAL, CKMB, CKMBINDEX, TROPONINI in the last 168 hours. BNP (last 3 results) No results for input(s): PROBNP in the last 8760 hours. HbA1C: No results for input(s): HGBA1C in the last 72 hours. CBG:  Recent Labs Lab 07/12/16 1707  GLUCAP 126*   Lipid Profile: No results for input(s): CHOL, HDL, LDLCALC, TRIG, CHOLHDL, LDLDIRECT in the last 72 hours. Thyroid Function Tests: No results for input(s): TSH, T4TOTAL, FREET4, T3FREE, THYROIDAB in the last 72 hours. Anemia Panel:  Recent Labs  07/13/16 1150  VITAMINB12 2,439*   Sepsis Labs:  Recent Labs Lab 07/12/16 2040 07/13/16 1437  PROCALCITON  --  <0.10  LATICACIDVEN 1.91* 1.2    Recent Results (from the past 240 hour(s))  Blood culture (routine x 2)     Status: None (Preliminary result)   Collection Time: 07/12/16  8:10 PM  Result Value Ref Range Status   Specimen Description BLOOD LEFT HAND  Final   Special Requests BOTTLES DRAWN AEROBIC AND ANAEROBIC 5CC  Final   Culture NO GROWTH 2 DAYS  Final   Report Status PENDING  Incomplete  Blood culture (routine x 2)     Status: None (Preliminary result)   Collection Time: 07/12/16  8:15 PM  Result Value Ref Range Status   Specimen Description BLOOD RIGHT HAND  Final    Special Requests BOTTLES DRAWN AEROBIC AND ANAEROBIC 5CC  Final   Culture NO GROWTH 2 DAYS  Final   Report Status PENDING  Incomplete  Culture, expectorated sputum-assessment     Status: None   Collection Time: 07/13/16  4:30 AM  Result Value Ref Range Status   Specimen Description EXPECTORATED SPUTUM  Final   Special Requests NONE  Final   Sputum evaluation   Final    THIS SPECIMEN IS ACCEPTABLE. RESPIRATORY CULTURE REPORT TO FOLLOW.   Report Status 07/13/2016 FINAL  Final  Culture, respiratory (NON-Expectorated)     Status:  None (Preliminary result)   Collection Time: 07/13/16  4:30 AM  Result Value Ref Range Status   Specimen Description SPUTUM  Final   Special Requests NONE  Final   Gram Stain   Final    NO WBC SEEN FEW SQUAMOUS EPITHELIAL CELLS PRESENT FEW GRAM POSITIVE COCCI IN PAIRS RARE GRAM POSITIVE RODS RARE GRAM NEGATIVE RODS    Culture CULTURE REINCUBATED FOR BETTER GROWTH  Final   Report Status PENDING  Incomplete     Radiology Studies: Dg Chest 2 View  Result Date: 07/14/2016 CLINICAL DATA:  Shortness of Breath EXAM: CHEST  2 VIEW COMPARISON:  07/12/2016 FINDINGS: Cardiomegaly. Small probable loculated pleural effusion. Left basilar atelectasis or infiltrate. Trace right pleural effusion. No pulmonary edema. Atherosclerotic calcifications of thoracic aorta. Old left lower rib fractures. IMPRESSION: Small partially loculated left pleural effusion. Atelectasis or infiltrate left base. No pulmonary edema. Trace right pleural effusion. Electronically Signed   By: Lahoma Crocker M.D.   On: 07/14/2016 09:19   Dg Chest 2 View  Result Date: 07/12/2016 CLINICAL DATA:  Shortness of breath and dizziness. Failure to thrive. Coughing. EXAM: CHEST  2 VIEW COMPARISON:  05/11/2016 and multiple previous FINDINGS: Heart size is normal. There is aortic atherosclerosis. There is a newly seen left effusion with left lower lobe infiltrate and volume loss. Mild chronic pleural blunting on the  right appears the same. Chronic calcified granuloma in the right lower lung is the same. Upper lobe emphysema again noted. IMPRESSION: Development of a left effusion with left lower lobe atelectasis and/or pneumonia. Electronically Signed   By: Nelson Chimes M.D.   On: 07/12/2016 17:53      Scheduled Meds: . acetylcysteine  3 mL Nebulization QID  . albuterol  2.5 mg Nebulization QID  . apixaban  5 mg Oral BID  . carvedilol  6.25 mg Oral BID WC  . [START ON 07/15/2016] ceFEPime (MAXIPIME) IV  2 g Intravenous Q24H  . meclizine  25 mg Oral Once  . methylPREDNISolone (SOLU-MEDROL) injection  60 mg Intravenous Q12H  . mirtazapine  7.5-15 mg Oral QHS  . sodium chloride flush  3 mL Intravenous Q12H  . tiotropium  18 mcg Inhalation Daily  . vancomycin  500 mg Intravenous Q12H   Continuous Infusions: . dextrose 5 % and 0.45% NaCl 75 mL/hr at 07/14/16 1253     LOS: 2 days    Chipper Oman, MD [facility] Pager [phone]  If 7PM-7AM, please contact night-coverage [email or web address] Password [facility] 07/14/2016, 3:18 PM

## 2016-07-14 NOTE — Progress Notes (Signed)
CRITICAL VALUE ALERT  Critical value received:  Vancomycin 22  Date of notification:  07/14/16  Time of notification:  22:45  Critical value read back:Yes.    Nurse who received alert:  Chestine Spore RN   MD notified (1st page):  [facility]  Time of first page:  22:55  MD notified (2nd page):  Time of second page:  Responding MD:  Dr Maudie Mercury  Time MD responded:  2300

## 2016-07-14 NOTE — Progress Notes (Signed)
ANTICOAGULATION CONSULT NOTE - Initial Consult  Pharmacy Consult for  IV heparin  Indication: atrial fibrillation , not able to take po Eliquis (NPO)  Allergies  Allergen Reactions  . No Known Allergies     Patient Measurements: Height: 5\' 7"  (170.2 cm) Weight: 139 lb (63 kg) IBW/kg (Calculated) : 66.1 Heparin Dosing Weight: 63 kg  Vital Signs: Temp: 97.7 F (36.5 C) (10/22 1511) Temp Source: Oral (10/22 1511) BP: 114/73 (10/22 1511) Pulse Rate: 53 (10/22 1511)  Labs:  Recent Labs  07/12/16 1708 07/13/16 0605 07/14/16 0625  HGB 10.7* 9.8* 11.9*  HCT 33.2* 30.7* 38.6*  PLT 189 160 200  CREATININE 1.01 0.89 1.30*    Estimated Creatinine Clearance: 36.4 mL/min (by C-G formula based on SCr of 1.3 mg/dL (H)).   Medical History: Past Medical History:  Diagnosis Date  . Anemia   . Arthritis    "legs" (02/24/2014)  . Cellulitis and abscess of toe 08/2015   LEFT FOOT   . Claudication of calf muscles ([place]) 11/05/2012   right calf  . Constipation   . COPD (chronic obstructive pulmonary disease) ([place])   . Coronary artery disease   . Dysrhythmia    atrial fibrilation  . GERD (gastroesophageal reflux disease)   . Hypertension   . Macular degeneration, bilateral    "has had shots in both eyes"  . Neuromuscular disorder (HCC)    dizziness  . On home oxygen therapy    "2L; w/activity" (02/24/2014)  . Peripheral vascular disease ([place])   . Pneumonia    "5 times back to back recently (just finished antibiiotic) " (02/24/2014)  . Shortness of breath    with exertion  . Stroke ([place]) 05/2013   "can't use left arm fully since"  . TIA (transient ischemic attack) 2010  . Type II diabetes mellitus (HCC)     Medications:  Prescriptions Prior to Admission  Medication Sig Dispense Refill Last Dose  . acetaminophen (TYLENOL) 325 MG tablet Take 2 tablets (650 mg total) by mouth every 4 (four) hours as needed for mild pain. 100 tablet 0 Past Month at Unknown time  . allopurinol  (ZYLOPRIM) 100 MG tablet Take 100 mg by mouth 2 (two) times daily as needed (for gout flares).    Past Month at Unknown time  . carvedilol (COREG) 6.25 MG tablet Take 6.25 mg by mouth 2 (two) times daily with a meal.   07/12/2016 at 0900  . colchicine 0.6 MG tablet Take 1 tablet (0.6 mg total) by mouth 2 (two) times daily. (Patient taking differently: Take 0.6 mg by mouth 2 (two) times daily as needed (for gout flares). ) 10 tablet 0 Past Month at Unknown time  . docusate sodium (COLACE) 100 MG capsule Take 100 mg by mouth See admin instructions. One to two times a day as needed for constipation   Past Week at Unknown time  . ELIQUIS 5 MG TABS tablet Take 5 mg by mouth 2 (two) times daily.    07/12/2016 at am  . furosemide (LASIX) 20 MG tablet Take 1 tablet (20 mg total) by mouth every other day. (Patient taking differently: Take 20 mg by mouth daily as needed for edema. ) 30 tablet  07/12/2016 at am  . ipratropium-albuterol (DUONEB) 0.5-2.5 (3) MG/3ML SOLN Take 3 mLs by nebulization every 6 (six) hours as needed (for wheezing or shortness of breath).    07/12/2016 at am  . magnesium oxide (MAG-OX) 400 MG tablet Take 200 mg by mouth daily.  07/12/2016 at am  . metFORMIN (GLUCOPHAGE) 1000 MG tablet Take 0.5 tablets (500 mg total) by mouth 2 (two) times daily with a meal.   07/12/2016 at am  . mirtazapine (REMERON) 15 MG tablet Take 7.5-15 mg by mouth at bedtime.    07/11/2016 at pm  . Multiple Vitamins-Minerals (OCUVITE PRESERVISION PO) Take 1 tablet by mouth daily.   07/12/2016 at am  . nitroGLYCERIN (NITRODUR - DOSED IN MG/24 HR) 0.2 mg/hr patch Place 0.2 mg onto the skin daily.   07/11/2016 at am  . Nutritional Supplements (ENSURE ENLIVE PO) Take 1 Can by mouth 2 (two) times daily with breakfast and lunch.   Past Week at Unknown time  . pantoprazole (PROTONIX) 40 MG tablet Take 1 tablet (40 mg total) by mouth 2 (two) times daily. 30 tablet 0 07/12/2016 at am  . simvastatin (ZOCOR) 40 MG tablet Take  40 mg by mouth every evening.   07/11/2016 at pm  . Tiotropium Bromide Monohydrate (SPIRIVA RESPIMAT) 2.5 MCG/ACT AERS Inhale 2 puffs into the lungs daily.   07/12/2016 at am  . vitamin B-12 (CYANOCOBALAMIN) 1000 MCG tablet Take 1,000 mcg by mouth daily.   07/12/2016 at am  . feeding supplement, GLUCERNA SHAKE, (GLUCERNA SHAKE) LIQD Take 237 mLs by mouth daily. (Patient not taking: Reported on 07/12/2016) 12 Can 0 Not Taking at Unknown time   Scheduled:  . acetylcysteine  3 mL Nebulization QID  . albuterol  2.5 mg Nebulization QID  . apixaban  5 mg Oral BID  . carvedilol  6.25 mg Oral BID WC  . [START ON 07/15/2016] ceFEPime (MAXIPIME) IV  2 g Intravenous Q24H  . meclizine  25 mg Oral Once  . methylPREDNISolone (SOLU-MEDROL) injection  60 mg Intravenous Q12H  . mirtazapine  7.5-15 mg Oral QHS  . sodium chloride flush  3 mL Intravenous Q12H  . tiotropium  18 mcg Inhalation Daily  . vancomycin  500 mg Intravenous Q12H    Assessment: 82 y.o male admitted on 07/12/16 with recurrent pneumonia. He is on chronic Eliquis 5 mg po BID PTA for h/o afib. Dysphagia -likely causing aspiration- failed swallow eval, severe dysphagia -NPO , thus unable to take po Eliquis. Pharmacy consulted to start IV heparin infusion.  Hgb 11.9, Hct 38.6, pltc 200 No bleeding noted  PTA on Eliquis 5 mg po BID, last taken PTA on 10/20 AM  Goal of Therapy:  PTT 66-102 seconds Heparin level = 0.3-0.7 units/ml Monitor platelets by anticoagulation protocol: Yes   Plan:  Discontinue Eliquis STAT aPTT, heparin level Start IV heparin drip 850 units/hr  Check 6 hours aPTT    Nicole Cella, RPh Clinical Pharmacist Pager: [phone] 07/14/2016,5:27 PM

## 2016-07-14 NOTE — Progress Notes (Signed)
Pharmacy Antibiotic Note  [name] is a 82 y.o. male admitted on 07/12/2016 with pneumonia.  Pharmacy has been consulted for vancomycin dosing and antibiotic renal dosing adjustments. Patient's renal function is declining with an increase in serum creatinine from 0.89 to 1.3 in 24 hours. Cefepime 1 gram IV every 8 hours is no longer appropriate for his renal function, and accumulation could lead to toxicities. Vancomycin is also renally cleared and nephrotoxic. WBC is wnl but increasing; patient remains afebrile; worsened breathing noted per RN.   Plan: Continue vancomycin 500 mg IV every 12 hours Obtain a vanc trough level this evening to assess for accumulation Decreased cefepime to 2 grams IV every 24 hours based on renal function Monitor clinical course and renal function   Height: 5\' 7"  (170.2 cm) Weight: 139 lb (63 kg) IBW/kg (Calculated) : 66.1  Temp (24hrs), Avg:97.9 F (36.6 C), Min:97.5 F (36.4 C), Max:98.5 F (36.9 C)   Recent Labs Lab 07/12/16 1708 07/12/16 2040 07/13/16 0605 07/13/16 1437 07/14/16 0625  WBC 7.0  --  4.7  --  7.9  CREATININE 1.01  --  0.89  --  1.30*  LATICACIDVEN  --  1.91*  --  1.2  --     Estimated Creatinine Clearance: 36.4 mL/min (by C-G formula based on SCr of 1.3 mg/dL (H)).    Allergies  Allergen Reactions  . No Known Allergies     Antimicrobials this admission: Vancomycin 10/20 >> Cefepime 10/20 >>  Dose adjustments this admission: 10/22 cefepime 1g IV q8h to 2g IV q24 for decreasing renal function  Microbiology results: 10/20 BCx: no growth to date 10/20 resp: few gram pos cocci, rare gram neg rods - re-incubated for better growth, pending  Thank you for allowing pharmacy to be a part of this patient's care.  Belia Heman, PharmD PGY1 Pharmacy Resident [phone] (Pager) 07/14/2016 11:17 AM

## 2016-07-15 ENCOUNTER — Ambulatory Visit (INDEPENDENT_AMBULATORY_CARE_PROVIDER_SITE_OTHER): Payer: Medicare Other | Admitting: Orthopedic Surgery

## 2016-07-15 ENCOUNTER — Inpatient Hospital Stay (HOSPITAL_COMMUNITY): Payer: Medicare Other

## 2016-07-15 DIAGNOSIS — Z515 Encounter for palliative care: Secondary | ICD-10-CM

## 2016-07-15 DIAGNOSIS — Z7189 Other specified counseling: Secondary | ICD-10-CM

## 2016-07-15 DIAGNOSIS — R0602 Shortness of breath: Secondary | ICD-10-CM

## 2016-07-15 LAB — BLOOD GAS, ARTERIAL
ACID-BASE DEFICIT: 3 mmol/L — AB (ref 0.0–2.0)
Bicarbonate: 23.5 mmol/L (ref 20.0–28.0)
DRAWN BY: 345601
O2 Content: 5 L/min
O2 SAT: 91.1 %
PATIENT TEMPERATURE: 98.6
PH ART: 7.228 — AB (ref 7.350–7.450)
pCO2 arterial: 58.6 mmHg — ABNORMAL HIGH (ref 32.0–48.0)
pO2, Arterial: 71.8 mmHg — ABNORMAL LOW (ref 83.0–108.0)

## 2016-07-15 LAB — CBC WITH DIFFERENTIAL/PLATELET
Basophils Absolute: 0 10*3/uL (ref 0.0–0.1)
Basophils Relative: 0 %
EOS ABS: 0 10*3/uL (ref 0.0–0.7)
EOS PCT: 0 %
HCT: 31.9 % — ABNORMAL LOW (ref 39.0–52.0)
Hemoglobin: 10 g/dL — ABNORMAL LOW (ref 13.0–17.0)
LYMPHS PCT: 18 %
Lymphs Abs: 0.7 10*3/uL (ref 0.7–4.0)
MCH: 31.8 pg (ref 26.0–34.0)
MCHC: 31.3 g/dL (ref 30.0–36.0)
MCV: 101.6 fL — AB (ref 78.0–100.0)
MONO ABS: 1 10*3/uL (ref 0.1–1.0)
Monocytes Relative: 26 %
Neutro Abs: 2.1 10*3/uL (ref 1.7–7.7)
Neutrophils Relative %: 55 %
PLATELETS: 174 10*3/uL (ref 150–400)
RBC: 3.14 MIL/uL — AB (ref 4.22–5.81)
RDW: 15.2 % (ref 11.5–15.5)
WBC: 3.8 10*3/uL — AB (ref 4.0–10.5)

## 2016-07-15 LAB — BASIC METABOLIC PANEL
Anion gap: 8 (ref 5–15)
BUN: 52 mg/dL — AB (ref 6–20)
CALCIUM: 8.5 mg/dL — AB (ref 8.9–10.3)
CO2: 24 mmol/L (ref 22–32)
CREATININE: 1.26 mg/dL — AB (ref 0.61–1.24)
Chloride: 106 mmol/L (ref 101–111)
GFR calc Af Amer: 58 mL/min — ABNORMAL LOW (ref >60)
GFR, EST NON AFRICAN AMERICAN: 50 mL/min — AB (ref >60)
GLUCOSE: 206 mg/dL — AB (ref 65–99)
POTASSIUM: 4.7 mmol/L (ref 3.5–5.1)
SODIUM: 138 mmol/L (ref 135–145)

## 2016-07-15 LAB — APTT
APTT: 61 s — AB (ref 24–36)
aPTT: 70 seconds — ABNORMAL HIGH (ref 24–36)
aPTT: 80 seconds — ABNORMAL HIGH (ref 24–36)

## 2016-07-15 LAB — CULTURE, RESPIRATORY: CULTURE: NORMAL

## 2016-07-15 LAB — PROCALCITONIN: PROCALCITONIN: 0.22 ng/mL

## 2016-07-15 LAB — HEPARIN LEVEL (UNFRACTIONATED)
Heparin Unfractionated: >2.2 IU/mL — ABNORMAL HIGH (ref 0.30–0.70)
Heparin Unfractionated: 2.08 IU/mL — ABNORMAL HIGH (ref 0.30–0.70)

## 2016-07-15 LAB — CULTURE, RESPIRATORY W GRAM STAIN: Gram Stain: NONE SEEN

## 2016-07-15 MED ORDER — SILVER SULFADIAZINE 1 % EX CREA
TOPICAL_CREAM | Freq: Every day | CUTANEOUS | Status: DC
  Administered 2016-07-15 – 2016-07-17 (×3): via TOPICAL
  Filled 2016-07-15: qty 85

## 2016-07-15 MED ORDER — MORPHINE SULFATE (CONCENTRATE) 10 MG/0.5ML PO SOLN: 5.0000 mg | ORAL | Status: DC | PRN

## 2016-07-15 NOTE — Progress Notes (Signed)
ANTICOAGULATION CONSULT NOTE - Follow Up Consult  Pharmacy Consult for  IV heparin  Indication: atrial fibrillation , not able to take po Eliquis (NPO)  Allergies  Allergen Reactions  . No Known Allergies     Patient Measurements: Height: 5\' 7"  (170.2 cm) Weight: 139 lb (63 kg) IBW/kg (Calculated) : 66.1 Heparin Dosing Weight: 63 kg  Vital Signs: Temp: 97.9 F (36.6 C) (10/23 0543) Temp Source: Oral (10/23 0543) BP: 133/75 (10/23 0543) Pulse Rate: 83 (10/23 0823)  Labs:  Recent Labs  07/13/16 0605 07/14/16 0625 07/14/16 1758 07/14/16 1759 07/15/16 0044 07/15/16 0643  HGB 9.8* 11.9*  --   --  10.0*  --   HCT 30.7* 38.6*  --   --  31.9*  --   PLT 160 200  --   --  174  --   APTT  --   --   --  37* 70* 61*  HEPARINUNFRC  --   --  >2.20*  --  >2.20* 2.08*  CREATININE 0.89 1.30*  --   --  1.26*  --     Estimated Creatinine Clearance: 37.6 mL/min (by C-G formula based on SCr of 1.26 mg/dL (H)).   Medical History: Past Medical History:  Diagnosis Date  . Anemia   . Arthritis    "legs" (02/24/2014)  . Cellulitis and abscess of toe 08/2015   LEFT FOOT   . Claudication of calf muscles ([place]) 11/05/2012   right calf  . Constipation   . COPD (chronic obstructive pulmonary disease) ([place])   . Coronary artery disease   . Dysrhythmia    atrial fibrilation  . GERD (gastroesophageal reflux disease)   . Hypertension   . Macular degeneration, bilateral    "has had shots in both eyes"  . Neuromuscular disorder (HCC)    dizziness  . On home oxygen therapy    "2L; w/activity" (02/24/2014)  . Peripheral vascular disease ([place])   . Pneumonia    "5 times back to back recently (just finished antibiiotic) " (02/24/2014)  . Shortness of breath    with exertion  . Stroke ([place]) 05/2013   "can't use left arm fully since"  . TIA (transient ischemic attack) 2010  . Type II diabetes mellitus (HCC)     Medications:  Prescriptions Prior to Admission  Medication Sig Dispense Refill  Last Dose  . acetaminophen (TYLENOL) 325 MG tablet Take 2 tablets (650 mg total) by mouth every 4 (four) hours as needed for mild pain. 100 tablet 0 Past Month at Unknown time  . allopurinol (ZYLOPRIM) 100 MG tablet Take 100 mg by mouth 2 (two) times daily as needed (for gout flares).    Past Month at Unknown time  . carvedilol (COREG) 6.25 MG tablet Take 6.25 mg by mouth 2 (two) times daily with a meal.   07/12/2016 at 0900  . colchicine 0.6 MG tablet Take 1 tablet (0.6 mg total) by mouth 2 (two) times daily. (Patient taking differently: Take 0.6 mg by mouth 2 (two) times daily as needed (for gout flares). ) 10 tablet 0 Past Month at Unknown time  . docusate sodium (COLACE) 100 MG capsule Take 100 mg by mouth See admin instructions. One to two times a day as needed for constipation   Past Week at Unknown time  . ELIQUIS 5 MG TABS tablet Take 5 mg by mouth 2 (two) times daily.    07/12/2016 at am  . furosemide (LASIX) 20 MG tablet Take 1 tablet (20 mg  total) by mouth every other day. (Patient taking differently: Take 20 mg by mouth daily as needed for edema. ) 30 tablet  07/12/2016 at am  . ipratropium-albuterol (DUONEB) 0.5-2.5 (3) MG/3ML SOLN Take 3 mLs by nebulization every 6 (six) hours as needed (for wheezing or shortness of breath).    07/12/2016 at am  . magnesium oxide (MAG-OX) 400 MG tablet Take 200 mg by mouth daily.   07/12/2016 at am  . metFORMIN (GLUCOPHAGE) 1000 MG tablet Take 0.5 tablets (500 mg total) by mouth 2 (two) times daily with a meal.   07/12/2016 at am  . mirtazapine (REMERON) 15 MG tablet Take 7.5-15 mg by mouth at bedtime.    07/11/2016 at pm  . Multiple Vitamins-Minerals (OCUVITE PRESERVISION PO) Take 1 tablet by mouth daily.   07/12/2016 at am  . nitroGLYCERIN (NITRODUR - DOSED IN MG/24 HR) 0.2 mg/hr patch Place 0.2 mg onto the skin daily.   07/11/2016 at am  . Nutritional Supplements (ENSURE ENLIVE PO) Take 1 Can by mouth 2 (two) times daily with breakfast and lunch.    Past Week at Unknown time  . pantoprazole (PROTONIX) 40 MG tablet Take 1 tablet (40 mg total) by mouth 2 (two) times daily. 30 tablet 0 07/12/2016 at am  . simvastatin (ZOCOR) 40 MG tablet Take 40 mg by mouth every evening.   07/11/2016 at pm  . Tiotropium Bromide Monohydrate (SPIRIVA RESPIMAT) 2.5 MCG/ACT AERS Inhale 2 puffs into the lungs daily.   07/12/2016 at am  . vitamin B-12 (CYANOCOBALAMIN) 1000 MCG tablet Take 1,000 mcg by mouth daily.   07/12/2016 at am  . feeding supplement, GLUCERNA SHAKE, (GLUCERNA SHAKE) LIQD Take 237 mLs by mouth daily. (Patient not taking: Reported on 07/12/2016) 12 Can 0 Not Taking at Unknown time   Scheduled:  . acetylcysteine  3 mL Nebulization QID  . albuterol  2.5 mg Nebulization QID  . carvedilol  6.25 mg Oral BID WC  . ceFEPime (MAXIPIME) IV  2 g Intravenous Q24H  . meclizine  25 mg Oral Once  . methylPREDNISolone (SOLU-MEDROL) injection  60 mg Intravenous Q12H  . mirtazapine  7.5-15 mg Oral QHS  . sodium chloride flush  3 mL Intravenous Q12H  . tiotropium  18 mcg Inhalation Daily  . vancomycin  750 mg Intravenous Q24H    Assessment: 82 y.o male admitted on 07/12/16 with recurrent pneumonia. He is on chronic Eliquis 5 mg po BID PTA for h/o afib.  Dysphagia -likely causing aspiration- failed swallow eval, now NPO, thus unable to take po Eliquis. Pharmacy consulted to start IV heparin infusion.   Heparin level > 2 (still influenced by recent eliquis dose) but aPTT slightly subtherapeutic at 61 (goal  66 -102). CBC slightly lower but stable.   Goal of Therapy:  PTT 66-102 seconds Heparin level = 0.3-0.7 units/ml Monitor platelets by anticoagulation protocol: Yes   Plan:  - Increase IV heparin drip to 950 units/hr  - Obtain another aPTT in 8 hours - Follow up SLP recommendations  Vincenza Hews, PharmD, BCPS 07/15/2016, 10:22 AM Pager: [phone]

## 2016-07-15 NOTE — Consult Note (Signed)
Consultation Note Date: 07/15/2016   Patient Name: [name]  DOB: 06-19-1928  MRN: [id number]  Age / Sex: 82 y.o., male  PCP: [name], MD Referring Physician: Doreatha Lew, MD  Reason for Consultation: Establishing goals of care   HPI/Patient Profile: 82 y.o. male  with past medical history of dementia, COPD, Afib, HTN, CAD, CVA, DM2 and PVD  admitted on 07/12/2016 with SOB and productive cough. Workup revealed pneumonia and he was admitted for treatment. SLP eval reveals severe dysphagia with aspiration. Pt has had aspiration pneumonia several times in the last month and is at high risk for developing it again. Family requesting PEG. Palliative medicine consulted for [place].    Clinical Assessment and Goals of Care: Met with daughter, Lenna Sciara [facility]), patient's lifetime partner (not legal spouse), and patient's son after evaluating patient. Discussed disease trajectory of dementia and dysphagia. Reviewed that patient's dementia and dysphagia cannot be fixed. Patient's partner noticed that patient has had a decreased appetite in the past few months. Discussed that dementia is a disease of the brain that people die from. Part of the dying process is that the brain is unable to direct the body to function, and frequently this includes not being able to/not wanting to eat. Discussed that PEGs are used in patient's who have reversible problems and are not intended to be a means of sustaining life. Reviewed likely complications of PEG and the fact that dementia is a contraindication for PEG placement. Family agrees that their goal would be for patient to be comfortable and understand that comfort feedings are more in line with this goal than PEG. Discussed that continued hospitalizations may also not be in line with this goal. Family interested in pursuing hospice care for patient. There is some discord among  family members about where patient should go at discharge. Patient is likely candidate for residential hospice if family chooses comfort measures only. Otherwise, would recommend home with Hospice support vs. SNF with Hospice support. Patient's daughter, Lenna Sciara, would like patient to return home, however, "spouse" (with whom he lives) is uncertain about her abilities to care for him in the home and desires residential placement. Plan to reconvene with family at 11am tomorrow for further discussion of [place] and disposition.   Primary Decision Maker HCPOA- Melissa Dick    SUMMARY OF RECOMMENDATIONS -No PEG -Begin comfort feeding, family understands likelihood of aspiration but desire feeding as a comfort measure -D/C with Hospice -Consider transition to comfort care only    Code Status/Advance Care Planning:  DNR    Symptom Management:  -Symptom management for SOB:   -Morphine '2mg'$  IV q1hr prn severe SOB  -Morphine intesol ('10mg'$ /0.20m) '5mg'$  SL q1hr prn moderate SOB and as premed for any exertional activity  -Transition to long acting morphine after 24hr dose requirements are obtained  -Fan blowing directly in face  -Keep room cool -Prophylaxis for opioid induced constipation:    -sennakot 17.'2mg'$  BID    Palliative Prophylaxis:   Frequent Pain Assessment and frequent assessment for  SOB  Additional Recommendations (Limitations, Scope, Preferences):  Avoid Hospitalization, Minimize Medications, Initiate Comfort Feeding, No Artificial Feeding and No Tracheostomy  Psycho-social/Spiritual:   Desire for further Chaplaincy support:No  Additional Recommendations: Education on Hospice  Prognosis:    < 6 months d/t dementia, dysphagia, comfort feeding, may be less if family decides to pursue comfort care  Discharge Planning: To Be Determined Home w/ hospice vs. SNF w/ hospice vs. Residential hospice  Primary Diagnoses: Present on Admission: . Healthcare-associated pneumonia .  Atrial fibrillation ([place]) . COPD (chronic obstructive pulmonary disease) ([place]) . HTN (hypertension) . Dementia   I have reviewed the medical record, interviewed the patient and family, and examined the patient. The following aspects are pertinent.  Past Medical History:  Diagnosis Date  . Anemia   . Arthritis    "legs" (02/24/2014)  . Cellulitis and abscess of toe 08/2015   LEFT FOOT   . Claudication of calf muscles ([place]) 11/05/2012   right calf  . Constipation   . COPD (chronic obstructive pulmonary disease) ([place])   . Coronary artery disease   . Dysrhythmia    atrial fibrilation  . GERD (gastroesophageal reflux disease)   . Hypertension   . Macular degeneration, bilateral    "has had shots in both eyes"  . Neuromuscular disorder (HCC)    dizziness  . On home oxygen therapy    "2L; w/activity" (02/24/2014)  . Peripheral vascular disease ([place])   . Pneumonia    "5 times back to back recently (just finished antibiiotic) " (02/24/2014)  . Shortness of breath    with exertion  . Stroke ([place]) 05/2013   "can't use left arm fully since"  . TIA (transient ischemic attack) 2010  . Type II diabetes mellitus ([place])    Social History   Social History  . Marital status: Married    Spouse name: N/A  . Number of children: N/A  . Years of education: N/A   Social History Main Topics  . Smoking status: Former Smoker    Packs/day: 3.00    Years: 35.00    Types: Cigarettes    Quit date: 09/23/1981  . Smokeless tobacco: Never Used  . Alcohol use Yes     Comment: QUITS YEARS AGO  . Drug use: No  . Sexual activity: No   Other Topics Concern  . None   Social History Narrative   ** Merged History Encounter **       Family History  Problem Relation Age of Onset  . Hypertension Other   . Cancer Neg Hx   . CAD Neg Hx    Scheduled Meds: . acetylcysteine  3 mL Nebulization QID  . albuterol  2.5 mg Nebulization QID  . carvedilol  6.25 mg Oral BID WC  . ceFEPime (MAXIPIME) IV  2 g  Intravenous Q24H  . meclizine  25 mg Oral Once  . methylPREDNISolone (SOLU-MEDROL) injection  60 mg Intravenous Q12H  . mirtazapine  7.5-15 mg Oral QHS  . sodium chloride flush  3 mL Intravenous Q12H  . tiotropium  18 mcg Inhalation Daily   Continuous Infusions: . dextrose 5 % and 0.45% NaCl 1,000 mL (07/15/16 0627)  . heparin 950 Units/hr (07/15/16 1053)   PRN Meds:.sodium chloride, acetaminophen, ipratropium-albuterol, sodium chloride flush Medications Prior to Admission:  Prior to Admission medications   Medication Sig Start Date End Date Taking? Authorizing Provider  acetaminophen (TYLENOL) 325 MG tablet Take 2 tablets (650 mg total) by mouth every 4 (four) hours as  needed for mild pain. 11/21/15  Yes Neldon Labella, NP  allopurinol (ZYLOPRIM) 100 MG tablet Take 100 mg by mouth 2 (two) times daily as needed (for gout flares).  06/28/14  Yes Historical Provider, MD  carvedilol (COREG) 6.25 MG tablet Take 6.25 mg by mouth 2 (two) times daily with a meal.   Yes Historical Provider, MD  colchicine 0.6 MG tablet Take 1 tablet (0.6 mg total) by mouth 2 (two) times daily. Patient taking differently: Take 0.6 mg by mouth 2 (two) times daily as needed (for gout flares).  04/21/14  Yes Junius Creamer, NP  docusate sodium (COLACE) 100 MG capsule Take 100 mg by mouth See admin instructions. One to two times a day as needed for constipation   Yes Historical Provider, MD  ELIQUIS 5 MG TABS tablet Take 5 mg by mouth 2 (two) times daily.  04/01/16  Yes Historical Provider, MD  furosemide (LASIX) 20 MG tablet Take 1 tablet (20 mg total) by mouth every other day. Patient taking differently: Take 20 mg by mouth daily as needed for edema.  05/13/16  Yes Clanford Marisa Hua, MD  ipratropium-albuterol (DUONEB) 0.5-2.5 (3) MG/3ML SOLN Take 3 mLs by nebulization every 6 (six) hours as needed (for wheezing or shortness of breath).    Yes Historical Provider, MD  magnesium oxide (MAG-OX) 400 MG tablet Take 200 mg by  mouth daily.   Yes Historical Provider, MD  metFORMIN (GLUCOPHAGE) 1000 MG tablet Take 0.5 tablets (500 mg total) by mouth 2 (two) times daily with a meal. 11/22/15  Yes Adrian Prows, MD  mirtazapine (REMERON) 15 MG tablet Take 7.5-15 mg by mouth at bedtime.    Yes Historical Provider, MD  Multiple Vitamins-Minerals (OCUVITE PRESERVISION PO) Take 1 tablet by mouth daily.   Yes Historical Provider, MD  nitroGLYCERIN (NITRODUR - DOSED IN MG/24 HR) 0.2 mg/hr patch Place 0.2 mg onto the skin daily. 06/21/16  Yes Historical Provider, MD  Nutritional Supplements (ENSURE ENLIVE PO) Take 1 Can by mouth 2 (two) times daily with breakfast and lunch.   Yes Historical Provider, MD  pantoprazole (PROTONIX) 40 MG tablet Take 1 tablet (40 mg total) by mouth 2 (two) times daily. 07/22/14  Yes Charlynne Cousins, MD  simvastatin (ZOCOR) 40 MG tablet Take 40 mg by mouth every evening.   Yes Historical Provider, MD  Tiotropium Bromide Monohydrate (SPIRIVA RESPIMAT) 2.5 MCG/ACT AERS Inhale 2 puffs into the lungs daily.   Yes Historical Provider, MD  vitamin B-12 (CYANOCOBALAMIN) 1000 MCG tablet Take 1,000 mcg by mouth daily.   Yes Historical Provider, MD  feeding supplement, GLUCERNA SHAKE, (GLUCERNA SHAKE) LIQD Take 237 mLs by mouth daily. Patient not taking: Reported on 07/12/2016 05/12/16   Murlean Iba, MD   Allergies  Allergen Reactions  . No Known Allergies    Review of Systems  Unable to perform ROS: Dementia    Physical Exam  Constitutional: He appears cachectic.  HENT:  Head: Normocephalic and atraumatic.  Cardiovascular: Normal rate.   Irregular, distant  Pulmonary/Chest: Effort normal.  Diminished in bases  Abdominal: Soft. Bowel sounds are normal.  Musculoskeletal:       Right shoulder: He exhibits decreased strength.  Neurological: He is alert. He is disoriented.  Pleasantly confused, oreiented to person only  Skin: Skin is warm and dry.  Psychiatric: His speech is tangential. Cognition  and memory are impaired.    Vital Signs: BP 133/75 (BP Location: Left Arm)   Pulse 60   Temp 97.9  F (36.6 C) (Oral)   Resp 18   Ht '5\' 7"'$  (1.702 m)   Wt 63 kg (139 lb)   SpO2 97%   BMI 21.77 kg/m  Pain Assessment: No/denies pain   Pain Score: Asleep   SpO2: SpO2: 97 % O2 Device:SpO2: 97 % O2 Flow Rate: .O2 Flow Rate (L/min): 4 L/min  IO: Intake/output summary:  Intake/Output Summary (Last 24 hours) at 07/15/16 1404 Last data filed at 07/14/16 1709  Gross per 24 hour  Intake           318.75 ml  Output                0 ml  Net           318.75 ml    LBM: Last BM Date: 07/11/16 Baseline Weight: Weight: 62.9 kg (138 lb 9 oz) Most recent weight: Weight: 63 kg (139 lb)     Palliative Assessment/Data: PPS: 20%     Thank you for this consult. Palliative medicine will continue to follow and assist as needed.   Time In: 1330  Time Out: 1420  Time Total: 50 mins Greater than 50%  of this time was spent counseling and coordinating care related to the above assessment and plan.  Signed by: Mariana Kaufman, AGNP-C Palliative Medicine    Please contact Palliative Medicine Team phone at [phone] for questions and concerns.  For individual provider: See Shea Evans

## 2016-07-15 NOTE — Consult Note (Addendum)
[place] Nurse wound consult note Pt is followed by Dr Sharol Given as an outpatient and states he has been using Silvadene. Reason for Consult:Lt Foot wound Wound type:Chronic wound/ anterior foot Pressure Ulcer POA: Yes Measurement: 1.5x0.5x0.2 Wound [id number] slough Drainage (amount, consistency, odor) Small serous Periwound:Intact Dressing procedure/placement/frequency:Apply silvadene cream to wound cover with foam border, change every day.   [place] Nurse wound consult note Reason for Consult:Lt Foot wound Wound type:Chronic Stage 2 pressure injury Pressure Ulcer POA: Yes Measurement: 1.5x1 Wound [id number] Drainage (amount, consistency, odor) Small serous Periwound:Intact Dressing procedure/placement/frequency:Cover with foam border, change every 3 days.   [place] Nurse wound consult note Reason for Consult:Lt Foot wound Wound type:Chronic wound/ amputation site Pressure Ulcer POA: Yes Measurement: 1x1.5 Wound [id number] slough Drainage (amount, consistency, odor) Small serous Periwound:Intact Dressing procedure/placement/frequency:Apply silvadene cream to wound cover with foam border, change every day.   [place] Nurse wound consult note Reason for Consult:Lt Foot wound Wound type:Chronic wound/ Lateral foot Pressure Ulcer POA: Yes Measurement: 2x1.5 Wound [id number] slough Drainage (amount, consistency, odor) Small serous Periwound:Intact Dressing procedure/placement/frequency:Apply silvadene cream to wound cover with foam border, change every day.  Pt can follow-up with Dr Sharol Given after discharge. Please re-consult if further assistance is needed.  Thank-you,  Julien Girt MSN, [place], [place], [place], [place]

## 2016-07-15 NOTE — Consult Note (Signed)
   [facility] CM Inpatient Consult   07/15/2016  Dalon Blattel [facility] 1928/01/16 [id number]   1400:  Patient screened for services for COPD and Pneumonia in the [employer] [place] registry.  Patient was being seen by Palliative Care.  Review of notes reveals that the patient will transition to comfort care with Hospice.  [facility] Care Management services will not indicated as he will receive full care management with Hospice.   Natividad Brood, RN BSN [facility] Liaison  [phone] business mobile phone Toll free office [phone]

## 2016-07-15 NOTE — Progress Notes (Signed)
ANTICOAGULATION CONSULT NOTE - Follow Up Consult  Pharmacy Consult for  IV heparin  Indication: atrial fibrillation , not able to take po Eliquis (NPO)  Allergies  Allergen Reactions  . No Known Allergies     Patient Measurements: Height: 5\' 7"  (170.2 cm) Weight: 139 lb (63 kg) IBW/kg (Calculated) : 66.1 Heparin Dosing Weight: 63 kg  Vital Signs: Temp: 97.6 F (36.4 C) (10/23 1518) Temp Source: Oral (10/23 1518) BP: 121/84 (10/23 1518) Pulse Rate: 54 (10/23 1629)  Labs:  Recent Labs  07/13/16 0605 07/14/16 0625 07/14/16 1758  07/15/16 0044 07/15/16 0643 07/15/16 1931  HGB 9.8* 11.9*  --   --  10.0*  --   --   HCT 30.7* 38.6*  --   --  31.9*  --   --   PLT 160 200  --   --  174  --   --   APTT  --   --   --   < > 70* 61* 80*  HEPARINUNFRC  --   --  >2.20*  --  >2.20* 2.08*  --   CREATININE 0.89 1.30*  --   --  1.26*  --   --   < > = values in this interval not displayed.  Estimated Creatinine Clearance: 37.6 mL/min (by C-G formula based on SCr of 1.26 mg/dL (H)).  Medical History: Past Medical History:  Diagnosis Date  . Anemia   . Arthritis    "legs" (02/24/2014)  . Cellulitis and abscess of toe 08/2015   LEFT FOOT   . Claudication of calf muscles ([place]) 11/05/2012   right calf  . Constipation   . COPD (chronic obstructive pulmonary disease) ([place])   . Coronary artery disease   . Dysrhythmia    atrial fibrilation  . GERD (gastroesophageal reflux disease)   . Hypertension   . Macular degeneration, bilateral    "has had shots in both eyes"  . Neuromuscular disorder (HCC)    dizziness  . On home oxygen therapy    "2L; w/activity" (02/24/2014)  . Peripheral vascular disease ([place])   . Pneumonia    "5 times back to back recently (just finished antibiiotic) " (02/24/2014)  . Shortness of breath    with exertion  . Stroke ([place]) 05/2013   "can't use left arm fully since"  . TIA (transient ischemic attack) 2010  . Type II diabetes mellitus (HCC)      Medications:  Prescriptions Prior to Admission  Medication Sig Dispense Refill Last Dose  . acetaminophen (TYLENOL) 325 MG tablet Take 2 tablets (650 mg total) by mouth every 4 (four) hours as needed for mild pain. 100 tablet 0 Past Month at Unknown time  . allopurinol (ZYLOPRIM) 100 MG tablet Take 100 mg by mouth 2 (two) times daily as needed (for gout flares).    Past Month at Unknown time  . carvedilol (COREG) 6.25 MG tablet Take 6.25 mg by mouth 2 (two) times daily with a meal.   07/12/2016 at 0900  . colchicine 0.6 MG tablet Take 1 tablet (0.6 mg total) by mouth 2 (two) times daily. (Patient taking differently: Take 0.6 mg by mouth 2 (two) times daily as needed (for gout flares). ) 10 tablet 0 Past Month at Unknown time  . docusate sodium (COLACE) 100 MG capsule Take 100 mg by mouth See admin instructions. One to two times a day as needed for constipation   Past Week at Unknown time  . ELIQUIS 5 MG TABS tablet Take  5 mg by mouth 2 (two) times daily.    07/12/2016 at am  . furosemide (LASIX) 20 MG tablet Take 1 tablet (20 mg total) by mouth every other day. (Patient taking differently: Take 20 mg by mouth daily as needed for edema. ) 30 tablet  07/12/2016 at am  . ipratropium-albuterol (DUONEB) 0.5-2.5 (3) MG/3ML SOLN Take 3 mLs by nebulization every 6 (six) hours as needed (for wheezing or shortness of breath).    07/12/2016 at am  . magnesium oxide (MAG-OX) 400 MG tablet Take 200 mg by mouth daily.   07/12/2016 at am  . metFORMIN (GLUCOPHAGE) 1000 MG tablet Take 0.5 tablets (500 mg total) by mouth 2 (two) times daily with a meal.   07/12/2016 at am  . mirtazapine (REMERON) 15 MG tablet Take 7.5-15 mg by mouth at bedtime.    07/11/2016 at pm  . Multiple Vitamins-Minerals (OCUVITE PRESERVISION PO) Take 1 tablet by mouth daily.   07/12/2016 at am  . nitroGLYCERIN (NITRODUR - DOSED IN MG/24 HR) 0.2 mg/hr patch Place 0.2 mg onto the skin daily.   07/11/2016 at am  . Nutritional Supplements  (ENSURE ENLIVE PO) Take 1 Can by mouth 2 (two) times daily with breakfast and lunch.   Past Week at Unknown time  . pantoprazole (PROTONIX) 40 MG tablet Take 1 tablet (40 mg total) by mouth 2 (two) times daily. 30 tablet 0 07/12/2016 at am  . simvastatin (ZOCOR) 40 MG tablet Take 40 mg by mouth every evening.   07/11/2016 at pm  . Tiotropium Bromide Monohydrate (SPIRIVA RESPIMAT) 2.5 MCG/ACT AERS Inhale 2 puffs into the lungs daily.   07/12/2016 at am  . vitamin B-12 (CYANOCOBALAMIN) 1000 MCG tablet Take 1,000 mcg by mouth daily.   07/12/2016 at am  . feeding supplement, GLUCERNA SHAKE, (GLUCERNA SHAKE) LIQD Take 237 mLs by mouth daily. (Patient not taking: Reported on 07/12/2016) 12 Can 0 Not Taking at Unknown time   Scheduled:  . acetylcysteine  3 mL Nebulization QID  . albuterol  2.5 mg Nebulization QID  . carvedilol  6.25 mg Oral BID WC  . ceFEPime (MAXIPIME) IV  2 g Intravenous Q24H  . meclizine  25 mg Oral Once  . mirtazapine  7.5-15 mg Oral QHS  . silver sulfADIAZINE   Topical Daily  . sodium chloride flush  3 mL Intravenous Q12H  . tiotropium  18 mcg Inhalation Daily   Assessment: 82 y.o male admitted on 07/12/16 with recurrent pneumonia. He is on chronic Eliquis 5 mg po BID PTA for h/o afib.  Dysphagia -likely causing aspiration- failed swallow eval, now NPO, thus unable to take po Eliquis. Pharmacy consulted to start IV heparin infusion.   aPTT therapeutic at 80 (goal  66 -102). CBC slightly lower but stable.   Goal of Therapy:  PTT 66-102 seconds Heparin level = 0.3-0.7 units/ml Monitor platelets by anticoagulation protocol: Yes   Plan:  - Continue heparin drip at 950 units/hr  - AM heparin level and aPTT - Follow-up Goals of Care  Georga Bora, PharmD Pager: [phone] 07/15/2016 8:38 PM

## 2016-07-15 NOTE — Progress Notes (Signed)
Pt has difficultly using the Spiriva inhaler. Not able to take a deep breath to get all of the medication.

## 2016-07-15 NOTE — Progress Notes (Addendum)
PROGRESS NOTE [facility]   ZACARIA GEGG   [id number] DOB: 13-Mar-1928  DOA: 07/12/2016 PCP: [name], MD   Brief Narrative: Jorge Ny 82 y/o Male with PMHx, Dementia, COPD, Afib, HTN, CAD, CVA, DM type 2 and PVD presents to the emergency department with chief complaint of cough and shortness of breath. He reported that he has had worsening cough for the past 3 weeks. He denies any fevers, but does report having chills. Patient is accompanied by family members, state that he has had worsening cough with productive thick brown sputum. Patient is normally on 2 L of oxygen at home, and has been saturating about 92%. Patient also complains of left sided mid back pain. He was admitted 2 months ago for pneumonia. In ED was found to have left lower pneumonia with effusion. Patient admitted for treatment of pneumonia and swallow eval.   Subjective:  Patient seen and examined today, more awake and interactive. Continues to cough but unable bring anything up, loud gargling sound with breathing. Patient report that can't swallow but is thirsty, requesting some water.   Assessment & Plan:    Healthcare-associated pneumonia - could be also related to aspiration - failed swallow eval - high risk for aspiration  -Continue abx treatment - d/c vanc, continue cefepime for now  -Continue Nebs  -Follow up cultures -Continue NPO for now  -Wean O2 as tolerated - keep sat above 91% -Continue steroids - COPD component  -Procalcitonin <10  -Will repeat CXR today    Discussed with daughter prognosis of the patient, which overall is very poor. Family continues to inquire about PEG tube feedings. I continued to recommend against this procedure. Patient with overall poor prognosis, multiple hospitalizations in the past 3 months. Patient continues to decline, PEG tube will not add any benefit to quality of life. Also it won't completely eliminates the risk of aspiration. Family with somewhat  unrealistic expectations. Palliative care consulted, to discuss goals of care with the family. Will continue to treat infection and supportive measures.   COPD (chronic obstructive pulmonary disease) ([place]) -See above   Atrial fibrillation (HCC) - HR controlled - CHADVASC 7 - on Eliquis - not taking a/c due to nothing by mouth status. -Cont heparin drip for now -SCD -Continue BB -Monitor on Tele   History of CVA  -Continue home meds   Dysphagia - likely causing aspiration - failed swallow eval, severe dysphagia -NPO   Malnutrition related to chronic illness as evidenced by severe depletion of muscle mass, severe depletion of body fat. -See nutrition recs  -Patient NPO at this time due to severe dysphagia  -D51/2NS for caloric intake   DM - stable  SSI   Dementia  -Continue home meds  Pressure injury of skin - Stage 2 Left heal pressures ulcer  -Frequent turns -wound care    DVT prophylaxis: Heparin  Code Status: DNR/DNI   Family Communication: Wife at bedside. Case discussed with daughter over the phone  Disposition Plan: Pending on family decision regarding goals of care, palliative to follow.    Consultants:   None   Procedures:   None   Antimicrobials:  Cefepime 10/20  Vanco 10/20-10/23   Objective: Vitals:   07/14/16 2226 07/15/16 0543 07/15/16 0823 07/15/16 1130  BP: 107/65 133/75    Pulse: (!) 53 (!) 121 83 60  Resp: 19 18 18 18   Temp: 97.7 F (36.5 C) 97.9 F (36.6 C)    TempSrc: Oral Oral    SpO2:  100% 95% 92% 97%  Weight:      Height:        Intake/Output Summary (Last 24 hours) at 07/15/16 1137 Last data filed at 07/14/16 1709  Gross per 24 hour  Intake           318.75 ml  Output                0 ml  Net           318.75 ml   Filed Weights   07/12/16 1727 07/12/16 2157  Weight: 62.9 kg (138 lb 9 oz) 63 kg (139 lb)    Examination:  General exam: Appears calm and comfortable, confuse, cachectic  Respiratory system: decrease  air entry, increase upper lobes rhonchi. No wheezing Cardiovascular system: S1 & S2 heard, RRR. No JVD, murmurs, rubs or gallops Gastrointestinal system: Abdomen is nondistended, soft and nontender. No organomegaly or masses felt.  Central nervous system: CN grossly intact  Extremities: No pedal edema. Symmetric Skin: Pressures lesion noted    Data Reviewed: I have personally reviewed following labs and imaging studies  CBC:  Recent Labs Lab 07/12/16 1708 07/13/16 0605 07/14/16 0625 07/15/16 0044  WBC 7.0 4.7 7.9 3.8*  NEUTROABS 3.9 1.9 6.3 2.1  HGB 10.7* 9.8* 11.9* 10.0*  HCT 33.2* 30.7* 38.6* 31.9*  MCV 100.9* 100.7* 104.3* 101.6*  PLT 189 160 200 [id number]   Basic Metabolic Panel:  Recent Labs Lab 07/12/16 1708 07/13/16 0605 07/14/16 0625 07/15/16 0044  NA 138 141 138 138  K 4.6 4.2 6.0* 4.7  CL 103 106 105 106  CO2 27 26 22 24   GLUCOSE 120* 107* 145* 206*  BUN 38* 35* 47* 52*  CREATININE 1.01 0.89 1.30* 1.26*  CALCIUM 8.6* 8.3* 8.6* 8.5*   GFR: Estimated Creatinine Clearance: 37.6 mL/min (by C-G formula based on SCr of 1.26 mg/dL (H)). Liver Function Tests:  Recent Labs Lab 07/12/16 1708  AST 20  ALT 11*  ALKPHOS 72  BILITOT 0.5  PROT 6.6  ALBUMIN 3.0*   No results for input(s): LIPASE, AMYLASE in the last 168 hours. No results for input(s): AMMONIA in the last 168 hours. Coagulation Profile: No results for input(s): INR, PROTIME in the last 168 hours. Cardiac Enzymes: No results for input(s): CKTOTAL, CKMB, CKMBINDEX, TROPONINI in the last 168 hours. BNP (last 3 results) No results for input(s): PROBNP in the last 8760 hours. HbA1C: No results for input(s): HGBA1C in the last 72 hours. CBG:  Recent Labs Lab 07/12/16 1707  GLUCAP 126*   Lipid Profile: No results for input(s): CHOL, HDL, LDLCALC, TRIG, CHOLHDL, LDLDIRECT in the last 72 hours. Thyroid Function Tests: No results for input(s): TSH, T4TOTAL, FREET4, T3FREE, THYROIDAB in the last 72  hours. Anemia Panel:  Recent Labs  07/13/16 1150  VITAMINB12 2,439*   Sepsis Labs:  Recent Labs Lab 07/12/16 2040 07/13/16 1437 07/15/16 0044  PROCALCITON  --  <0.10 0.22  LATICACIDVEN 1.91* 1.2  --     Recent Results (from the past 240 hour(s))  Blood culture (routine x 2)     Status: None (Preliminary result)   Collection Time: 07/12/16  8:10 PM  Result Value Ref Range Status   Specimen Description BLOOD LEFT HAND  Final   Special Requests BOTTLES DRAWN AEROBIC AND ANAEROBIC 5CC  Final   Culture NO GROWTH 2 DAYS  Final   Report Status PENDING  Incomplete  Blood culture (routine x 2)     Status: None (Preliminary  result)   Collection Time: 07/12/16  8:15 PM  Result Value Ref Range Status   Specimen Description BLOOD RIGHT HAND  Final   Special Requests BOTTLES DRAWN AEROBIC AND ANAEROBIC 5CC  Final   Culture NO GROWTH 2 DAYS  Final   Report Status PENDING  Incomplete  Culture, expectorated sputum-assessment     Status: None   Collection Time: 07/13/16  4:30 AM  Result Value Ref Range Status   Specimen Description EXPECTORATED SPUTUM  Final   Special Requests NONE  Final   Sputum evaluation   Final    THIS SPECIMEN IS ACCEPTABLE. RESPIRATORY CULTURE REPORT TO FOLLOW.   Report Status 07/13/2016 FINAL  Final  Culture, respiratory (NON-Expectorated)     Status: None   Collection Time: 07/13/16  4:30 AM  Result Value Ref Range Status   Specimen Description SPUTUM  Final   Special Requests NONE  Final   Gram Stain   Final    NO WBC SEEN FEW SQUAMOUS EPITHELIAL CELLS PRESENT FEW GRAM POSITIVE COCCI IN PAIRS RARE GRAM POSITIVE RODS RARE GRAM NEGATIVE RODS    Culture Consistent with normal respiratory flora.  Final   Report Status 07/15/2016 FINAL  Final     Radiology Studies: Dg Chest 2 View  Result Date: 07/14/2016 CLINICAL DATA:  Shortness of Breath EXAM: CHEST  2 VIEW COMPARISON:  07/12/2016 FINDINGS: Cardiomegaly. Small probable loculated pleural effusion.  Left basilar atelectasis or infiltrate. Trace right pleural effusion. No pulmonary edema. Atherosclerotic calcifications of thoracic aorta. Old left lower rib fractures. IMPRESSION: Small partially loculated left pleural effusion. Atelectasis or infiltrate left base. No pulmonary edema. Trace right pleural effusion. Electronically Signed   By: Lahoma Crocker M.D.   On: 07/14/2016 09:19      Scheduled Meds: . acetylcysteine  3 mL Nebulization QID  . albuterol  2.5 mg Nebulization QID  . carvedilol  6.25 mg Oral BID WC  . ceFEPime (MAXIPIME) IV  2 g Intravenous Q24H  . meclizine  25 mg Oral Once  . methylPREDNISolone (SOLU-MEDROL) injection  60 mg Intravenous Q12H  . mirtazapine  7.5-15 mg Oral QHS  . sodium chloride flush  3 mL Intravenous Q12H  . tiotropium  18 mcg Inhalation Daily  . vancomycin  750 mg Intravenous Q24H   Continuous Infusions: . dextrose 5 % and 0.45% NaCl 1,000 mL (07/15/16 0627)  . heparin 950 Units/hr (07/15/16 1053)     LOS: 3 days    Chipper Oman, MD [facility] Pager [phone]  If 7PM-7AM, please contact night-coverage [email or web address] Password [facility] 07/15/2016, 11:37 AM

## 2016-07-15 NOTE — Progress Notes (Signed)
Speech Language Pathology Treatment: Dysphagia  Patient Details Name: [name] MRN: [id number] DOB: 1928/06/26 Today's Date: 07/15/2016 Time: 1202-1216 SLP Time Calculation (min) (ACUTE ONLY): 14 min  Assessment / Plan / Recommendation Clinical Impression  Visited pt with family at bedside. Pt more cooperative today, but continue to demonstrate signs of severe dysphagia with thin liquids and puree with hard coughing and expectoration. Even with hand over hand assist for small single sips coughing persisted. Reiterated high risk of aspiration with all texture to family and discussed chronic nature of dysphagia and dementia as well at the complications of PEG tube placement in pts with dementia and inability to eliminate risk of aspiration or fully withhold PO from pt.  Discussed the potential for comfort feeding with known risk of aspiration. Family to f/u with palliative care today for plan of care.    HPI HPI: [name] 82 y/o Male with PMHx, Dementia, COPD, Afib, HTN, CAD, CVA, DM type 2 and PVD presents to the emergency department with chief complaint of cough and shortness of breath. He reportedthat he has had worsening cough for the past 3 weeks. He denies any fevers, but does report having chills. Patient is accompanied by family members, state that he has had worsening cough with productive thick brown sputum. Patient is normally on 2 L of oxygen at home, and has been saturating about 92%. Patient also complains of left sided mid back pain. He was admitted 2 months ago for pneumonia. In ED was found to have left lower pneumonia witheffusion. Patient admitted for treatment of pneumonia and swallow eval. Pt has a history of dysphagia and has been found to have high risk risk of aspriation with all textures due to silent aspiration with large boluses and aspiration of residuals. He has been recommended to take small single sips and swallow twice, recommendations he has had before and  has not complied with.       SLP Plan  Continue with current plan of care     Recommendations  Diet recommendations: Regular;Thin liquid (comfort feeding) Liquids provided via: Cup Medication Administration: Whole meds with puree Supervision: Full supervision/cueing for compensatory strategies Compensations: Slow rate;Small sips/bites Postural Changes and/or Swallow Maneuvers: Seated upright 90 degrees                Oral Care Recommendations: Oral care QID Plan: Continue with current plan of care       GO                Anays Detore, Katherene Ponto 07/15/2016, 1:42 PM

## 2016-07-15 NOTE — Progress Notes (Signed)
ANTICOAGULATION CONSULT NOTE - Follow Up Consult  Pharmacy Consult for  IV heparin  Indication: atrial fibrillation , not able to take po Eliquis (NPO)  Allergies  Allergen Reactions  . No Known Allergies     Patient Measurements: Height: 5\' 7"  (170.2 cm) Weight: 139 lb (63 kg) IBW/kg (Calculated) : 66.1 Heparin Dosing Weight: 63 kg  Vital Signs: Temp: 97.7 F (36.5 C) (10/22 2226) Temp Source: Oral (10/22 2226) BP: 107/65 (10/22 2226) Pulse Rate: 53 (10/22 2226)  Labs:  Recent Labs  07/13/16 0605 07/14/16 0625 07/14/16 1758 07/14/16 1759 07/15/16 0044  HGB 9.8* 11.9*  --   --  10.0*  HCT 30.7* 38.6*  --   --  31.9*  PLT 160 200  --   --  174  APTT  --   --   --  37* 70*  HEPARINUNFRC  --   --  >2.20*  --  >2.20*  CREATININE 0.89 1.30*  --   --  1.26*    Estimated Creatinine Clearance: 37.6 mL/min (by C-G formula based on SCr of 1.26 mg/dL (H)).   Medical History: Past Medical History:  Diagnosis Date  . Anemia   . Arthritis    "legs" (02/24/2014)  . Cellulitis and abscess of toe 08/2015   LEFT FOOT   . Claudication of calf muscles ([place]) 11/05/2012   right calf  . Constipation   . COPD (chronic obstructive pulmonary disease) ([place])   . Coronary artery disease   . Dysrhythmia    atrial fibrilation  . GERD (gastroesophageal reflux disease)   . Hypertension   . Macular degeneration, bilateral    "has had shots in both eyes"  . Neuromuscular disorder (HCC)    dizziness  . On home oxygen therapy    "2L; w/activity" (02/24/2014)  . Peripheral vascular disease ([place])   . Pneumonia    "5 times back to back recently (just finished antibiiotic) " (02/24/2014)  . Shortness of breath    with exertion  . Stroke ([place]) 05/2013   "can't use left arm fully since"  . TIA (transient ischemic attack) 2010  . Type II diabetes mellitus (HCC)     Medications:  Prescriptions Prior to Admission  Medication Sig Dispense Refill Last Dose  . acetaminophen (TYLENOL) 325 MG  tablet Take 2 tablets (650 mg total) by mouth every 4 (four) hours as needed for mild pain. 100 tablet 0 Past Month at Unknown time  . allopurinol (ZYLOPRIM) 100 MG tablet Take 100 mg by mouth 2 (two) times daily as needed (for gout flares).    Past Month at Unknown time  . carvedilol (COREG) 6.25 MG tablet Take 6.25 mg by mouth 2 (two) times daily with a meal.   07/12/2016 at 0900  . colchicine 0.6 MG tablet Take 1 tablet (0.6 mg total) by mouth 2 (two) times daily. (Patient taking differently: Take 0.6 mg by mouth 2 (two) times daily as needed (for gout flares). ) 10 tablet 0 Past Month at Unknown time  . docusate sodium (COLACE) 100 MG capsule Take 100 mg by mouth See admin instructions. One to two times a day as needed for constipation   Past Week at Unknown time  . ELIQUIS 5 MG TABS tablet Take 5 mg by mouth 2 (two) times daily.    07/12/2016 at am  . furosemide (LASIX) 20 MG tablet Take 1 tablet (20 mg total) by mouth every other day. (Patient taking differently: Take 20 mg by mouth daily as  needed for edema. ) 30 tablet  07/12/2016 at am  . ipratropium-albuterol (DUONEB) 0.5-2.5 (3) MG/3ML SOLN Take 3 mLs by nebulization every 6 (six) hours as needed (for wheezing or shortness of breath).    07/12/2016 at am  . magnesium oxide (MAG-OX) 400 MG tablet Take 200 mg by mouth daily.   07/12/2016 at am  . metFORMIN (GLUCOPHAGE) 1000 MG tablet Take 0.5 tablets (500 mg total) by mouth 2 (two) times daily with a meal.   07/12/2016 at am  . mirtazapine (REMERON) 15 MG tablet Take 7.5-15 mg by mouth at bedtime.    07/11/2016 at pm  . Multiple Vitamins-Minerals (OCUVITE PRESERVISION PO) Take 1 tablet by mouth daily.   07/12/2016 at am  . nitroGLYCERIN (NITRODUR - DOSED IN MG/24 HR) 0.2 mg/hr patch Place 0.2 mg onto the skin daily.   07/11/2016 at am  . Nutritional Supplements (ENSURE ENLIVE PO) Take 1 Can by mouth 2 (two) times daily with breakfast and lunch.   Past Week at Unknown time  . pantoprazole  (PROTONIX) 40 MG tablet Take 1 tablet (40 mg total) by mouth 2 (two) times daily. 30 tablet 0 07/12/2016 at am  . simvastatin (ZOCOR) 40 MG tablet Take 40 mg by mouth every evening.   07/11/2016 at pm  . Tiotropium Bromide Monohydrate (SPIRIVA RESPIMAT) 2.5 MCG/ACT AERS Inhale 2 puffs into the lungs daily.   07/12/2016 at am  . vitamin B-12 (CYANOCOBALAMIN) 1000 MCG tablet Take 1,000 mcg by mouth daily.   07/12/2016 at am  . feeding supplement, GLUCERNA SHAKE, (GLUCERNA SHAKE) LIQD Take 237 mLs by mouth daily. (Patient not taking: Reported on 07/12/2016) 12 Can 0 Not Taking at Unknown time   Scheduled:  . acetylcysteine  3 mL Nebulization QID  . albuterol  2.5 mg Nebulization QID  . carvedilol  6.25 mg Oral BID WC  . ceFEPime (MAXIPIME) IV  2 g Intravenous Q24H  . meclizine  25 mg Oral Once  . methylPREDNISolone (SOLU-MEDROL) injection  60 mg Intravenous Q12H  . mirtazapine  7.5-15 mg Oral QHS  . sodium chloride flush  3 mL Intravenous Q12H  . tiotropium  18 mcg Inhalation Daily  . vancomycin  750 mg Intravenous Q24H    Assessment: 82 y.o male admitted on 07/12/16 with recurrent pneumonia. He is on chronic Eliquis 5 mg po BID PTA for h/o afib. Dysphagia -likely causing aspiration- failed swallow eval, now NPO, thus unable to take po Eliquis. Pharmacy consulted to start IV heparin infusion.   Heparin level > 2 (still influenced by recent eliquis dose) but aPTT at goal with heparin at 850 units/hr.  Goal of Therapy:  PTT 66-102 seconds Heparin level = 0.3-0.7 units/ml Monitor platelets by anticoagulation protocol: Yes   Plan:  Continue IV heparin drip 850 units/hr  Check 6 hours aPTT  For confirmation  [employer], Pharm.D., BCPS Clinical Pharmacist Pager [phone] 07/15/2016 2:10 AM

## 2016-07-16 LAB — BASIC METABOLIC PANEL
ANION GAP: 8 (ref 5–15)
BUN: 49 mg/dL — ABNORMAL HIGH (ref 6–20)
CALCIUM: 8.8 mg/dL — AB (ref 8.9–10.3)
CO2: 27 mmol/L (ref 22–32)
Chloride: 102 mmol/L (ref 101–111)
Creatinine, Ser: 1.01 mg/dL (ref 0.61–1.24)
Glucose, Bld: 142 mg/dL — ABNORMAL HIGH (ref 65–99)
Potassium: 4 mmol/L (ref 3.5–5.1)
Sodium: 137 mmol/L (ref 135–145)

## 2016-07-16 LAB — APTT: APTT: 63 s — AB (ref 24–36)

## 2016-07-16 LAB — CBC
HEMATOCRIT: 35.5 % — AB (ref 39.0–52.0)
Hemoglobin: 11.2 g/dL — ABNORMAL LOW (ref 13.0–17.0)
MCH: 32.2 pg (ref 26.0–34.0)
MCHC: 31.5 g/dL (ref 30.0–36.0)
MCV: 102 fL — ABNORMAL HIGH (ref 78.0–100.0)
PLATELETS: 159 10*3/uL (ref 150–400)
RBC: 3.48 MIL/uL — ABNORMAL LOW (ref 4.22–5.81)
RDW: 15.6 % — AB (ref 11.5–15.5)
WBC: 5.4 10*3/uL (ref 4.0–10.5)

## 2016-07-16 LAB — HEPARIN LEVEL (UNFRACTIONATED): HEPARIN UNFRACTIONATED: 1.4 [IU]/mL — AB (ref 0.30–0.70)

## 2016-07-16 MED ORDER — WARFARIN - PHARMACIST DOSING INPATIENT: Freq: Every day | Status: DC

## 2016-07-16 MED ORDER — WARFARIN SODIUM 5 MG PO TABS: 5.0000 mg | ORAL_TABLET | Freq: Once | ORAL | Status: DC

## 2016-07-16 MED ORDER — ACETYLCYSTEINE 20 % IN SOLN
3.0000 mL | Freq: Three times a day (TID) | RESPIRATORY_TRACT | Status: DC
  Administered 2016-07-16: 4 mL via RESPIRATORY_TRACT
  Administered 2016-07-16 – 2016-07-17 (×2): 3 mL via RESPIRATORY_TRACT
  Filled 2016-07-16 (×4): qty 4

## 2016-07-16 MED ORDER — ALBUTEROL SULFATE (2.5 MG/3ML) 0.083% IN NEBU
2.5000 mg | INHALATION_SOLUTION | Freq: Three times a day (TID) | RESPIRATORY_TRACT | Status: DC
  Administered 2016-07-16 – 2016-07-17 (×3): 2.5 mg via RESPIRATORY_TRACT
  Filled 2016-07-16 (×3): qty 3

## 2016-07-16 NOTE — Progress Notes (Signed)
Notified by Whitman Hero,  CMRN of family request for Hospice and [place] services at home after discharge. Chart and patient information currently under review to confirm hospice eligibility.   Spoke with the patient's daughter Lenna Sciara, his common law wife [name] as well as two sons at bedside to initiate education related to hospice philosophy, services and team approach to care. Family verbalized understanding of the information provided. Per discussion plan is for discharge to home by Ptar tomorrow after patient is evaluated by PT today.  Per daughter, if patient is able to ambulate they may choose to transport by car.   Please send signed completed DNR form home with patient.   Patient will need prescriptions for discharge comfort medications.   DME needs discussed.  Family is trying to make a determination as to whether the patient will go to [facility] home or [name]'s  home at discharge.  Daughter will notify [facility] before DME will be delivered.   If patient discharges to the daughters home he will need a hospital bed, OBT, oxygen concentrator, w/c and BSC.  If patient discharges to [name]'s home he may not need the bed.   [facility] equipment manager Jewel Ysidro Evert notified and will contact [place] to arrange delivery to the home.   The home address has been verified In the chart. This address is [name]'s home address;  Daughter Lenna Sciara lives next door at [phone].   Lenna Sciara is the family member to be contacted to arrange time of delivery.   [facility] aware of the above.  Completed discharge summary will need to be faxed to [facility] at [phone] when final.  Please notify [facility] when patient is ready to leave unit at discharge-call [phone].  [facility] information and contact numbers have been given to [facility] during visit.  Above information shared with Whitman Hero,  CMRN.   Please call with any questions.  Thank You,  Mickie Kay, [facility] Liaison  [phone]

## 2016-07-16 NOTE — Care Management Note (Signed)
Case Management Note  Patient Details  Name: [name] MRN: [id number] Date of Birth: 1928-05-01  Subjective/Objective:    Admitted  with recurrent pneumonia, hx of afib. From home with common law wife, Demetrius Charity.  PCP:  Dr. [name]  Action/Plan:  Palliative following..... Plan is to discharge with home health  vs home hospice.  Expected Discharge Date:     07/17/2016           Expected Discharge Plan:     In-House Referral:     Discharge planning Services  CM Consult  Post Acute Care Choice:    Choice offered to:     DME Arranged:    DME Agency:     HH Arranged:    HH Agency:     Status of Service:  In process, will continue to follow  If discussed at Long Length of Stay Meetings, dates discussed:    Additional Comments:  Sharin Mons, RN 07/16/2016, 11:22 AM

## 2016-07-16 NOTE — Progress Notes (Signed)
Note family plan to transition to comfort care. Education completed with family yesterday. No SLP f/u needed at this time. Will sign off.  Herbie [place], [place] CCC-SLP [phone]

## 2016-07-16 NOTE — Progress Notes (Signed)
ANTICOAGULATION CONSULT NOTE - Follow Up Consult  Pharmacy Consult for  IV heparin/warfarin Indication: atrial fibrillation , not able to take po Eliquis (NPO)  Allergies  Allergen Reactions  . No Known Allergies     Patient Measurements: Height: 5\' 7"  (170.2 cm) Weight: 139 lb (63 kg) IBW/kg (Calculated) : 66.1 Heparin Dosing Weight: 63 kg  Vital Signs: Temp: 98.2 F (36.8 C) (10/24 1412) Temp Source: Oral (10/24 1412) BP: 123/58 (10/24 1412) Pulse Rate: 58 (10/24 1412)  Labs:  Recent Labs  07/14/16 [id number]  07/15/16 0044 07/15/16 0643 07/15/16 1931 07/16/16 0528 07/16/16 0807  HGB 11.9*  --  10.0*  --   --  11.2*  --   HCT 38.6*  --  31.9*  --   --  35.5*  --   PLT 200  --  174  --   --  159  --   APTT  --   < > 70* 61* 80* 63*  --   HEPARINUNFRC  --   < > >2.20* 2.08*  --   --  1.40*  CREATININE 1.30*  --  1.26*  --   --  1.01  --   < > = values in this interval not displayed.  Estimated Creatinine Clearance: 46.9 mL/min (by C-G formula based on SCr of 1.01 mg/dL).  Medical History: Past Medical History:  Diagnosis Date  . Anemia   . Arthritis    "legs" (02/24/2014)  . Cellulitis and abscess of toe 08/2015   LEFT FOOT   . Claudication of calf muscles ([place]) 11/05/2012   right calf  . Constipation   . COPD (chronic obstructive pulmonary disease) ([place])   . Coronary artery disease   . Dysrhythmia    atrial fibrilation  . GERD (gastroesophageal reflux disease)   . Hypertension   . Macular degeneration, bilateral    "has had shots in both eyes"  . Neuromuscular disorder (HCC)    dizziness  . On home oxygen therapy    "2L; w/activity" (02/24/2014)  . Peripheral vascular disease ([place])   . Pneumonia    "5 times back to back recently (just finished antibiiotic) " (02/24/2014)  . Shortness of breath    with exertion  . Stroke ([place]) 05/2013   "can't use left arm fully since"  . TIA (transient ischemic attack) 2010  . Type II diabetes mellitus (HCC)      Medications:  Prescriptions Prior to Admission  Medication Sig Dispense Refill Last Dose  . acetaminophen (TYLENOL) 325 MG tablet Take 2 tablets (650 mg total) by mouth every 4 (four) hours as needed for mild pain. 100 tablet 0 Past Month at Unknown time  . allopurinol (ZYLOPRIM) 100 MG tablet Take 100 mg by mouth 2 (two) times daily as needed (for gout flares).    Past Month at Unknown time  . carvedilol (COREG) 6.25 MG tablet Take 6.25 mg by mouth 2 (two) times daily with a meal.   07/12/2016 at 0900  . colchicine 0.6 MG tablet Take 1 tablet (0.6 mg total) by mouth 2 (two) times daily. (Patient taking differently: Take 0.6 mg by mouth 2 (two) times daily as needed (for gout flares). ) 10 tablet 0 Past Month at Unknown time  . docusate sodium (COLACE) 100 MG capsule Take 100 mg by mouth See admin instructions. One to two times a day as needed for constipation   Past Week at Unknown time  . ELIQUIS 5 MG TABS tablet Take 5 mg by mouth  2 (two) times daily.    07/12/2016 at am  . furosemide (LASIX) 20 MG tablet Take 1 tablet (20 mg total) by mouth every other day. (Patient taking differently: Take 20 mg by mouth daily as needed for edema. ) 30 tablet  07/12/2016 at am  . ipratropium-albuterol (DUONEB) 0.5-2.5 (3) MG/3ML SOLN Take 3 mLs by nebulization every 6 (six) hours as needed (for wheezing or shortness of breath).    07/12/2016 at am  . magnesium oxide (MAG-OX) 400 MG tablet Take 200 mg by mouth daily.   07/12/2016 at am  . metFORMIN (GLUCOPHAGE) 1000 MG tablet Take 0.5 tablets (500 mg total) by mouth 2 (two) times daily with a meal.   07/12/2016 at am  . mirtazapine (REMERON) 15 MG tablet Take 7.5-15 mg by mouth at bedtime.    07/11/2016 at pm  . Multiple Vitamins-Minerals (OCUVITE PRESERVISION PO) Take 1 tablet by mouth daily.   07/12/2016 at am  . nitroGLYCERIN (NITRODUR - DOSED IN MG/24 HR) 0.2 mg/hr patch Place 0.2 mg onto the skin daily.   07/11/2016 at am  . Nutritional Supplements  (ENSURE ENLIVE PO) Take 1 Can by mouth 2 (two) times daily with breakfast and lunch.   Past Week at Unknown time  . pantoprazole (PROTONIX) 40 MG tablet Take 1 tablet (40 mg total) by mouth 2 (two) times daily. 30 tablet 0 07/12/2016 at am  . simvastatin (ZOCOR) 40 MG tablet Take 40 mg by mouth every evening.   07/11/2016 at pm  . Tiotropium Bromide Monohydrate (SPIRIVA RESPIMAT) 2.5 MCG/ACT AERS Inhale 2 puffs into the lungs daily.   07/12/2016 at am  . vitamin B-12 (CYANOCOBALAMIN) 1000 MCG tablet Take 1,000 mcg by mouth daily.   07/12/2016 at am  . feeding supplement, GLUCERNA SHAKE, (GLUCERNA SHAKE) LIQD Take 237 mLs by mouth daily. (Patient not taking: Reported on 07/12/2016) 12 Can 0 Not Taking at Unknown time   Scheduled:  . acetylcysteine  3 mL Nebulization TID  . albuterol  2.5 mg Nebulization TID  . carvedilol  6.25 mg Oral BID WC  . ceFEPime (MAXIPIME) IV  2 g Intravenous Q24H  . meclizine  25 mg Oral Once  . mirtazapine  7.5-15 mg Oral QHS  . silver sulfADIAZINE   Topical Daily  . sodium chloride flush  3 mL Intravenous Q12H  . tiotropium  18 mcg Inhalation Daily   Assessment: 82 y.o male admitted on 07/12/16 with recurrent pneumonia. He was on Eliquis 5 mg po BID PTA for h/o afib. Patient has been NPO for several days and will not be restarted on Eliquis. Pharmacy consulted to dose warfarin and bridge with IV heparin infusion until INR becomes therapeutic.   APTT slightly subtherapeutic at 63 (goal 66 -102). CBC stable with no sxs of bleeding.   Goal of Therapy:  PTT 66-102 seconds Heparin level = 0.3-0.7 units/ml INR 2-3 Monitor platelets by anticoagulation protocol: Yes   Plan:  1. Continue heparin infusion at 1050 unit/hr 2. Warfarin 5mg  tonight x1 3. AM heparin level, aPTT, and INR 4. Monitor s/sx of bleeding  Georga Bora, PharmD Clinical Pharmacist Pager: [phone] 07/16/2016 5:56 PM

## 2016-07-16 NOTE — Progress Notes (Signed)
CM made aware from Palliative Nurse/ Mariana Kaufman NP, pt/family has agreed to home with hospice care. CM confirmed with pt/family and choice was given. [facility] was selected to provide home hospice services by pt/family. CM called and made referral with [facility] @ [phone]. Whitman Hero RN,BSN,CM

## 2016-07-16 NOTE — Evaluation (Addendum)
Physical Therapy Evaluation Patient Details Name: [name] MRN: [id number] DOB: 21-Apr-1928 Today's Date: 07/16/2016   History of Present Illness  pt is an 82 y/o male with h/o COPD, CVA with plegic L UE, CAD, admitted with 3 weeks of worsening SOB with productive cough.  Pt has contracted PNA again.  Palliative consulted and family have decided on home with hospice care.  Clinical Impression  Pt admitted with/for recurring PNA. Pt's family have discussed hospice care at home with palliative team.  Pt significantly weak at this time and takes moderate assist for most care/mobility..  Pt currently limited functionally due to the problems listed. ( See problems list.)   Pt will benefit from PT to maximize function and safety in order to get ready for next venue listed below.     Follow Up Recommendations SNF;Other (comment) (OR home with hospice and caregiver.)    Equipment Recommendations  None recommended by PT    Recommendations for Other Services       Precautions / Restrictions Precautions Precautions: Fall      Mobility  Bed Mobility Overal bed mobility: Needs Assistance Bed Mobility: Supine to Sit     Supine to sit: Mod assist     General bed mobility comments: pt struggled to EOB, needed stability assist to get up and min assist to scoot.  Transfers Overall transfer level: Needs assistance   Transfers: Sit to/from Stand;Stand Pivot Transfers Sit to Stand: Mod assist Stand pivot transfers: Mod assist       General transfer comment: mod to help with lift and stability and heavier mod for w/shift and stability for pt to pivot.  Ambulation/Gait             General Gait Details: not tested today, pt too fatigued/dyspneic  Science writer    Modified Rankin (Stroke Patients Only)       Balance Overall balance assessment: Needs assistance Sitting-balance support: No upper extremity supported Sitting balance-Leahy  Scale: Fair     Standing balance support: Single extremity supported Standing balance-Leahy Scale: Poor Standing balance comment: reliant on external support, stood at [facility] for pericare with min assist                             Pertinent Vitals/Pain Pain Assessment: Faces Faces Pain Scale: No hurt    Home Living Family/patient expects to be discharged to:: Private residence Living Arrangements: Other (Comment);Spouse/significant other (or daughter)   Type of Home: Mobile home Home Access: Ramped entrance     Home Layout: One level Home Equipment: Toilet riser;Wheelchair - Education administrator (comment) Additional Comments: hemiwalker    Prior Function Level of Independence: Needs assistance   Gait / Transfers Assistance Needed: Walks short distances in the home with hemiwalker and assist from family member.  ADL's / Homemaking Assistance Needed: wife cares for home        Hand Dominance   Dominant Hand: Right    Extremity/Trunk Assessment   Upper Extremity Assessment: RUE deficits/detail;LUE deficits/detail RUE Deficits / Details: functional strength at 4/5     LUE Deficits / Details: flexion in synergy at 3-/5, otherwise trace movement   Lower Extremity Assessment: RLE deficits/detail;LLE deficits/detail RLE Deficits / Details: weak, but functional at 4/5 grossly, LLE Deficits / Details: weak at grossly4-/5     Communication   Communication: HOH  Cognition Arousal/Alertness: Awake/alert Behavior During Therapy:  Restless Overall Cognitive Status: History of cognitive impairments - at baseline                      General Comments      Exercises     Assessment/Plan    PT Assessment Patient needs continued PT services  PT Problem List Decreased strength;Decreased activity tolerance;Decreased balance;Decreased mobility;Cardiopulmonary status limiting activity          PT Treatment Interventions Gait training;Functional mobility  training;Therapeutic activities;Therapeutic exercise;Balance training;Patient/family education    PT Goals (Current goals can be found in the Care Plan section)  Acute Rehab PT Goals Patient Stated Goal: "wife" and daughter want to get him home. PT Goal Formulation: With patient/family Time For Goal Achievement: 07/30/16 Potential to Achieve Goals: Fair    Frequency Min 3X/week   Barriers to discharge        Co-evaluation               End of Session   Activity Tolerance: Patient limited by fatigue Patient left: in bed;with call bell/phone within reach;with bed alarm set;with nursing/sitter in room Nurse Communication: Mobility status         Time: [id number] PT Time Calculation (min) (ACUTE ONLY): 35 min   Charges:   PT Evaluation $PT Eval Moderate Complexity: 1 Procedure PT Treatments $Therapeutic Activity: 8-22 mins   PT G Codes:        Delcie Ruppert, Tessie Fass 07/16/2016, 4:48 PM  07/16/2016  Donnella Sham, [place] [phone]  (pager)

## 2016-07-16 NOTE — Progress Notes (Signed)
PROGRESS NOTE [facility]   [name]   [id number] DOB: 05-Apr-1928  DOA: 07/12/2016 PCP: [name], MD   Brief Narrative: [name] 82 y/o Male with PMHx, Dementia, COPD, Afib, HTN, CAD, CVA, DM type 2 and PVD presents to the emergency department with chief complaint of cough and shortness of breath. He reported that he has had worsening cough for the past 3 weeks. He denies any fevers, but does report having chills. Patient is accompanied by family members, state that he has had worsening cough with productive thick brown sputum. Patient is normally on 2 L of oxygen at home, and has been saturating about 92%. Patient also complains of left sided mid back pain. He was admitted 2 months ago for pneumonia. In ED was found to have left lower pneumonia with effusion. Patient admitted for treatment of pneumonia and swallow eval.   Subjective:  Patient seen and examined today, more awake and interactive. Eating, having cough spells with swallowing. Denies SOP    Assessment & Plan:    Healthcare-associated pneumonia - could be also related to aspiration - failed swallow eval - high risk for aspiration - repeated CXR show some improvement   -Continue abx, can transition to PO tomorrow since patient is improving - diet was ordered per family request  -Continue Nebs  -Follow up cultures -Wean O2 as tolerated - keep sat above 91% - baseline is 2L at home  -Continue steroids - COPD component  -Procalcitonin <10    COPD (chronic obstructive pulmonary disease) ([place]) -See above   Atrial fibrillation (HCC) - HR controlled - CHADVASC 7 - on Eliquis - not taking a/c due to nothing by mouth status. -Cont heparin drip for now -SCD -Continue BB -Monitor on Tele   History of CVA  -Continue home meds  Dysphagia - likely causing aspiration - failed swallow eval, severe dysphagia -Family has opted for no PEG tube and allow patient to have comfort food. Patient will go home  with hospice care. -Dyspahgia diet ordered  Malnutrition related to chronic illness as evidenced by severe depletion of muscle mass, severe depletion of body fat. -See nutrition recs  -d/c IVF patient eating now   DM - stable  SSI   Dementia  -Continue home meds  Pressure injury of skin - Stage 2 Left heal pressures ulcer  -Frequent turns -wound care    DVT prophylaxis: Heparin  Code Status: DNR/DNI   Family Communication: Wife at bedside. Case discussed with daughter over the phone  Disposition Plan: Home with hospice when medically stable, 24-48 hrs    Consultants:   None   Procedures:   None   Antimicrobials:  Cefepime 10/20  Vanco 10/20-10/23   Objective: Vitals:   07/15/16 2102 07/16/16 0557 07/16/16 0647 07/16/16 0943  BP: (!) 123/43 121/67  (!) 142/57  Pulse: 89 (!) 106  (!) 58  Resp: 20 18    Temp: 97.7 F (36.5 C) 97.4 F (36.3 C)    TempSrc: Oral Oral    SpO2: 99% (!) 79% 90%   Weight:      Height:        Intake/Output Summary (Last 24 hours) at 07/16/16 1007 Last data filed at 07/16/16 0658  Gross per 24 hour  Intake          2199.74 ml  Output              350 ml  Net          1849.74  ml   Filed Weights   07/12/16 1727 07/12/16 2157  Weight: 62.9 kg (138 lb 9 oz) 63 kg (139 lb)    Examination:  General exam: Appears calm and comfortable, confuse, cachectic  Respiratory system: decrease air entry b/l upper lobes rhonchi and rales.  Cardiovascular system: S1 & S2 heard, RRR, murmurs, rubs or gallops  Central nervous system: Oriented to person only  Extremities: No pedal edema.  Skin: Pressures lesion noted    Data Reviewed: I have personally reviewed following labs and imaging studies  CBC:  Recent Labs Lab 07/12/16 1708 07/13/16 0605 07/14/16 0625 07/15/16 0044 07/16/16 0528  WBC 7.0 4.7 7.9 3.8* 5.4  NEUTROABS 3.9 1.9 6.3 2.1  --   HGB 10.7* 9.8* 11.9* 10.0* 11.2*  HCT 33.2* 30.7* 38.6* 31.9* 35.5*  MCV 100.9*  100.7* 104.3* 101.6* 102.0*  PLT 189 160 200 174 [id number]   Basic Metabolic Panel:  Recent Labs Lab 07/12/16 1708 07/13/16 0605 07/14/16 0625 07/15/16 0044 07/16/16 0528  NA 138 141 138 138 137  K 4.6 4.2 6.0* 4.7 4.0  CL 103 106 105 106 102  CO2 27 26 22 24 27   GLUCOSE 120* 107* 145* 206* 142*  BUN 38* 35* 47* 52* 49*  CREATININE 1.01 0.89 1.30* 1.26* 1.01  CALCIUM 8.6* 8.3* 8.6* 8.5* 8.8*   GFR: Estimated Creatinine Clearance: 46.9 mL/min (by C-G formula based on SCr of 1.01 mg/dL). Liver Function Tests:  Recent Labs Lab 07/12/16 1708  AST 20  ALT 11*  ALKPHOS 72  BILITOT 0.5  PROT 6.6  ALBUMIN 3.0*   No results for input(s): LIPASE, AMYLASE in the last 168 hours. No results for input(s): AMMONIA in the last 168 hours. Coagulation Profile: No results for input(s): INR, PROTIME in the last 168 hours. Cardiac Enzymes: No results for input(s): CKTOTAL, CKMB, CKMBINDEX, TROPONINI in the last 168 hours. BNP (last 3 results) No results for input(s): PROBNP in the last 8760 hours. HbA1C: No results for input(s): HGBA1C in the last 72 hours. CBG:  Recent Labs Lab 07/12/16 1707  GLUCAP 126*   Lipid Profile: No results for input(s): CHOL, HDL, LDLCALC, TRIG, CHOLHDL, LDLDIRECT in the last 72 hours. Thyroid Function Tests: No results for input(s): TSH, T4TOTAL, FREET4, T3FREE, THYROIDAB in the last 72 hours. Anemia Panel:  Recent Labs  07/13/16 1150  VITAMINB12 2,439*   Sepsis Labs:  Recent Labs Lab 07/12/16 2040 07/13/16 1437 07/15/16 0044  PROCALCITON  --  <0.10 0.22  LATICACIDVEN 1.91* 1.2  --     Recent Results (from the past 240 hour(s))  Blood culture (routine x 2)     Status: None (Preliminary result)   Collection Time: 07/12/16  8:10 PM  Result Value Ref Range Status   Specimen Description BLOOD LEFT HAND  Final   Special Requests BOTTLES DRAWN AEROBIC AND ANAEROBIC 5CC  Final   Culture NO GROWTH 3 DAYS  Final   Report Status PENDING   Incomplete  Blood culture (routine x 2)     Status: None (Preliminary result)   Collection Time: 07/12/16  8:15 PM  Result Value Ref Range Status   Specimen Description BLOOD RIGHT HAND  Final   Special Requests BOTTLES DRAWN AEROBIC AND ANAEROBIC 5CC  Final   Culture NO GROWTH 3 DAYS  Final   Report Status PENDING  Incomplete  Culture, expectorated sputum-assessment     Status: None   Collection Time: 07/13/16  4:30 AM  Result Value Ref Range Status  Specimen Description EXPECTORATED SPUTUM  Final   Special Requests NONE  Final   Sputum evaluation   Final    THIS SPECIMEN IS ACCEPTABLE. RESPIRATORY CULTURE REPORT TO FOLLOW.   Report Status 07/13/2016 FINAL  Final  Culture, respiratory (NON-Expectorated)     Status: None   Collection Time: 07/13/16  4:30 AM  Result Value Ref Range Status   Specimen Description SPUTUM  Final   Special Requests NONE  Final   Gram Stain   Final    NO WBC SEEN FEW SQUAMOUS EPITHELIAL CELLS PRESENT FEW GRAM POSITIVE COCCI IN PAIRS RARE GRAM POSITIVE RODS RARE GRAM NEGATIVE RODS    Culture Consistent with normal respiratory flora.  Final   Report Status 07/15/2016 FINAL  Final     Radiology Studies: Dg Chest Port 1 View  Result Date: 07/15/2016 CLINICAL DATA:  Initial evaluation for pneumonia, cough, congestion. EXAM: PORTABLE CHEST 1 VIEW COMPARISON:  It are From 07/14/2016. FINDINGS: Cardiomegaly is stable. Mediastinal silhouette within normal limits. Atheromatous plaque is within the aortic arch. Interval decrease in size of bilateral pleural effusions. Persistent but improved left basilar opacity, consistent with improved atelectasis or infiltrate. Mild right basilar atelectasis. No pulmonary edema. No pneumothorax. No acute osseus abnormality. IMPRESSION: 1. Interval improvement in left basilar opacity, compatible with improved atelectasis and/or infiltrate. 2. Persistent but improved small bilateral pleural effusions. Electronically Signed   By:  Jeannine Boga M.D.   On: 07/15/2016 12:53     Scheduled Meds: . acetylcysteine  3 mL Nebulization QID  . albuterol  2.5 mg Nebulization QID  . carvedilol  6.25 mg Oral BID WC  . ceFEPime (MAXIPIME) IV  2 g Intravenous Q24H  . meclizine  25 mg Oral Once  . mirtazapine  7.5-15 mg Oral QHS  . silver sulfADIAZINE   Topical Daily  . sodium chloride flush  3 mL Intravenous Q12H  . tiotropium  18 mcg Inhalation Daily   Continuous Infusions: . dextrose 5 % and 0.45% NaCl 75 mL/hr at 07/15/16 2127  . heparin 1,050 Units/hr (07/16/16 0955)     LOS: 4 days    Chipper Oman, MD [facility] Pager [phone]  If 7PM-7AM, please contact night-coverage [email or web address] Password [email or web address] 07/16/2016, 10:07 AM

## 2016-07-16 NOTE — Progress Notes (Signed)
Daily Progress Note   Patient Name: [name]       Date: 07/16/2016 DOB: December 24, 1927  Age: 82 y.o. MRN#: [id number] Attending Physician: Doreatha Lew, MD Primary Care Physician: [name], MD Admit Date: 07/12/2016  Reason for Consultation/Follow-up: Disposition and Establishing goals of care  Subjective: Met with patient's partner, Cori Razor, and daughter Lenna Sciara. After discussion they were able to agree to home with hospice care. They are currently attempting to determine if patient will d/c to daughter's home or to his home with [place]. Melissa would like him to dc home with her and remain there for the remainder of his lifetime. [name] has experienced the death of her mother in her home and would like for patient to be transferred to residential hospice when appropriate and does not desire death in her home. Support given and offer made to proceed with Hospice referral and determine exact placement with their help. Both agreed.   Review of Systems  Unable to perform ROS: Dementia    Length of Stay: 4  Current Medications: Scheduled Meds:  . acetylcysteine  3 mL Nebulization TID  . albuterol  2.5 mg Nebulization TID  . carvedilol  6.25 mg Oral BID WC  . ceFEPime (MAXIPIME) IV  2 g Intravenous Q24H  . meclizine  25 mg Oral Once  . mirtazapine  7.5-15 mg Oral QHS  . silver sulfADIAZINE   Topical Daily  . sodium chloride flush  3 mL Intravenous Q12H  . tiotropium  18 mcg Inhalation Daily    Continuous Infusions: . dextrose 5 % and 0.45% NaCl 75 mL/hr at 07/15/16 2127  . heparin 1,050 Units/hr (07/16/16 0955)    PRN Meds: sodium chloride, acetaminophen, ipratropium-albuterol, morphine CONCENTRATE, sodium chloride flush  Physical Exam  Constitutional: Vital signs are  normal. He appears cachectic.  elderly  Pulmonary/Chest: Effort normal and breath sounds normal.  Abdominal: Soft. Bowel sounds are normal.  Musculoskeletal:  Generalized weakness  Neurological: He is alert.  Pleasantly confused            Vital Signs: BP (!) 142/57   Pulse (!) 58   Temp 97.4 F (36.3 C) (Oral)   Resp 18   Ht _0  (1.702 m)   Wt 63 kg (139 lb)   SpO2 90%   BMI 21.77 kg/m  SpO2: SpO2: 90 % O2 Device: O2 Device: Nasal Cannula O2 Flow Rate: O2 Flow Rate (L/min): 4 L/min  Intake/output summary:  Intake/Output Summary (Last 24 hours) at 07/16/16 1339 Last data filed at 07/16/16 9758  Gross per 24 hour  Intake          2199.74 ml  Output              350 ml  Net          1849.74 ml   LBM: Last BM Date: 07/16/16 Baseline Weight: Weight: 62.9 kg (138 lb 9 oz) Most recent weight: Weight: 63 kg (139 lb)       Palliative Assessment/Data: 30%      Patient Active Problem List   Diagnosis Date Noted  . Shortness of breath   . Goals of care, counseling/discussion   . Advance care planning   . Palliative care by specialist   . Pressure injury of skin 07/13/2016  . Dementia 07/13/2016  . Healthcare-associated pneumonia 07/12/2016  . Respiratory failure, chronic ([place]) 07/12/2016  . Malnutrition of moderate degree 05/12/2016  . Critical lower limb ischemia 11/19/2015  . Toe infection s/p amputation 09/13/2015  . Cellulitis 09/11/2015  . Cellulitis of toe of left foot 09/11/2015  . Acute respiratory failure with hypoxia ([place]) 09/11/2015  . Pulmonary embolism on right ([place]) 06/23/2015  . Excessive cerumen in left ear canal 07/28/2014  . Acute confusional state 07/25/2014  . Hypoglycemia 07/25/2014  . Gastric dysmotility 07/20/2014  . Acute kidney injury ([place]) 07/05/2014  . Hypernatremia 07/05/2014  . Pneumatosis intestinalis 07/03/2014  . Hypokalemia 07/02/2014  . Dehydration 07/02/2014  . Gastroenteritis 07/02/2014  . Pleural effusion on left  02/24/2014  . Protein-calorie malnutrition, severe ([place]) 01/03/2014  . UTI (lower urinary tract infection) 12/31/2013  . HCAP (healthcare-associated pneumonia) 12/31/2013  . Aspiration pneumonia ([place]) 12/31/2013  . Acute blood loss anemia 12/31/2013  . Dysphagia 12/21/2013  . CAP (community acquired pneumonia) 10/31/2013  . TIA (transient ischemic attack) 07/06/2013  . PAD (peripheral artery disease) ([place]) 07/06/2013  . HTN (hypertension) 07/06/2013  . Diabetes mellitus ([place]) 07/06/2013  . HLD (hyperlipidemia) 07/06/2013  . COPD (chronic obstructive pulmonary disease) ([place]) 07/06/2013  . Atrial fibrillation ([place]) 07/06/2013  . History of GI bleed 07/06/2013  . Cardiomegaly 07/06/2013  . History of CVA (cerebrovascular accident) 07/06/2013    Palliative Care Assessment & Plan   Patient Profile:  82 y.o. male  with past medical history of dementia, COPD, Afib, HTN, CAD, CVA, DM2 and PVD  admitted on 07/12/2016 with SOB and productive cough. Workup revealed pneumonia and he was admitted for treatment. SLP eval reveals severe dysphagia with aspiration. Pt has had aspiration pneumonia several times in the last month and is at high risk for developing it again. Family requesting PEG. Palliative medicine consulted for [place].    Assessment/Recommendations/Plan   Plan for home with Hospice  Continue prn morphine as ordered    Goals of Care and Additional Recommendations:  Limitations on Scope of Treatment: Avoid Hospitalization, Full Comfort Care, No Artificial Feeding and No Blood Transfusions  Code Status:  DNR  Prognosis:   < 6 months d/t severe dysphagia s/p CVA, decreased cognitive and functional capacity  Discharge Planning:  Home with Hospice  Care plan was discussed with Lenna Sciara, [name].  Thank you for allowing the Palliative Medicine Team to assist in the care of this patient.   Time In: 1100 Time Out: 1130 Total Time 30 mins Prolonged Time Billed No  Greater than 50%  of this time was spent counseling and coordinating care related to the above assessment and plan.  Mariana Kaufman, AGNP-C Palliative Medicine   Please contact Palliative Medicine Team phone at [phone] for questions and concerns.

## 2016-07-16 NOTE — Progress Notes (Signed)
Nutrition Brief Note  Chart reviewed. Pt now transitioning to comfort care.  No further nutrition interventions warranted at this time.  Please re-consult as needed.   Lanita Stammen A. Halea Lieb, RD, LDN, CDE Pager: [phone] After hours Pager: [phone]  

## 2016-07-16 NOTE — Progress Notes (Signed)
ANTICOAGULATION CONSULT NOTE - Follow Up Consult  Pharmacy Consult for  IV heparin  Indication: atrial fibrillation , not able to take po Eliquis (NPO)  Allergies  Allergen Reactions  . No Known Allergies     Patient Measurements: Height: 5\' 7"  (170.2 cm) Weight: 139 lb (63 kg) IBW/kg (Calculated) : 66.1 Heparin Dosing Weight: 63 kg  Vital Signs: Temp: 97.4 F (36.3 C) (10/24 0557) Temp Source: Oral (10/24 0557) BP: 121/67 (10/24 0557) Pulse Rate: 106 (10/24 0557)  Labs:  Recent Labs  07/14/16 0625 07/14/16 1758  07/15/16 0044 07/15/16 0643 07/15/16 1931 07/16/16 0528  HGB 11.9*  --   --  10.0*  --   --  11.2*  HCT 38.6*  --   --  31.9*  --   --  35.5*  PLT 200  --   --  174  --   --  159  APTT  --   --   < > 70* 61* 80* 63*  HEPARINUNFRC  --  >2.20*  --  >2.20* 2.08*  --   --   CREATININE 1.30*  --   --  1.26*  --   --  1.01  < > = values in this interval not displayed.  Estimated Creatinine Clearance: 46.9 mL/min (by C-G formula based on SCr of 1.01 mg/dL).  Medical History: Past Medical History:  Diagnosis Date  . Anemia   . Arthritis    "legs" (02/24/2014)  . Cellulitis and abscess of toe 08/2015   LEFT FOOT   . Claudication of calf muscles ([place]) 11/05/2012   right calf  . Constipation   . COPD (chronic obstructive pulmonary disease) ([place])   . Coronary artery disease   . Dysrhythmia    atrial fibrilation  . GERD (gastroesophageal reflux disease)   . Hypertension   . Macular degeneration, bilateral    "has had shots in both eyes"  . Neuromuscular disorder ([facility])    dizziness  . On home oxygen therapy    "2L; w/activity" (02/24/2014)  . Peripheral vascular disease ([place])   . Pneumonia    "5 times back to back recently (just finished antibiiotic) " (02/24/2014)  . Shortness of breath    with exertion  . Stroke ([place]) 05/2013   "can't use left arm fully since"  . TIA (transient ischemic attack) 2010  . Type II diabetes mellitus (H[facility])     Medications:   Prescriptions Prior to Admission  Medication Sig Dispense Refill Last Dose  . acetaminophen (TYLENOL) 325 MG tablet Take 2 tablets (650 mg total) by mouth every 4 (four) hours as needed for mild pain. 100 tablet 0 Past Month at Unknown time  . allopurinol (ZYLOPRIM) 100 MG tablet Take 100 mg by mouth 2 (two) times daily as needed (for gout flares).    Past Month at Unknown time  . carvedilol (COREG) 6.25 MG tablet Take 6.25 mg by mouth 2 (two) times daily with a meal.   07/12/2016 at 0900  . colchicine 0.6 MG tablet Take 1 tablet (0.6 mg total) by mouth 2 (two) times daily. (Patient taking differently: Take 0.6 mg by mouth 2 (two) times daily as needed (for gout flares). ) 10 tablet 0 Past Month at Unknown time  . docusate sodium (COLACE) 100 MG capsule Take 100 mg by mouth See admin instructions. One to two times a day as needed for constipation   Past Week at Unknown time  . ELIQUIS 5 MG TABS tablet Take 5 mg by  mouth 2 (two) times daily.    07/12/2016 at am  . furosemide (LASIX) 20 MG tablet Take 1 tablet (20 mg total) by mouth every other day. (Patient taking differently: Take 20 mg by mouth daily as needed for edema. ) 30 tablet  07/12/2016 at am  . ipratropium-albuterol (DUONEB) 0.5-2.5 (3) MG/3ML SOLN Take 3 mLs by nebulization every 6 (six) hours as needed (for wheezing or shortness of breath).    07/12/2016 at am  . magnesium oxide (MAG-OX) 400 MG tablet Take 200 mg by mouth daily.   07/12/2016 at am  . metFORMIN (GLUCOPHAGE) 1000 MG tablet Take 0.5 tablets (500 mg total) by mouth 2 (two) times daily with a meal.   07/12/2016 at am  . mirtazapine (REMERON) 15 MG tablet Take 7.5-15 mg by mouth at bedtime.    07/11/2016 at pm  . Multiple Vitamins-Minerals (OCUVITE PRESERVISION PO) Take 1 tablet by mouth daily.   07/12/2016 at am  . nitroGLYCERIN (NITRODUR - DOSED IN MG/24 HR) 0.2 mg/hr patch Place 0.2 mg onto the skin daily.   07/11/2016 at am  . Nutritional Supplements (ENSURE ENLIVE PO)  Take 1 Can by mouth 2 (two) times daily with breakfast and lunch.   Past Week at Unknown time  . pantoprazole (PROTONIX) 40 MG tablet Take 1 tablet (40 mg total) by mouth 2 (two) times daily. 30 tablet 0 07/12/2016 at am  . simvastatin (ZOCOR) 40 MG tablet Take 40 mg by mouth every evening.   07/11/2016 at pm  . Tiotropium Bromide Monohydrate (SPIRIVA RESPIMAT) 2.5 MCG/ACT AERS Inhale 2 puffs into the lungs daily.   07/12/2016 at am  . vitamin B-12 (CYANOCOBALAMIN) 1000 MCG tablet Take 1,000 mcg by mouth daily.   07/12/2016 at am  . feeding supplement, GLUCERNA SHAKE, (GLUCERNA SHAKE) LIQD Take 237 mLs by mouth daily. (Patient not taking: Reported on 07/12/2016) 12 Can 0 Not Taking at Unknown time   Scheduled:  . acetylcysteine  3 mL Nebulization QID  . albuterol  2.5 mg Nebulization QID  . carvedilol  6.25 mg Oral BID WC  . ceFEPime (MAXIPIME) IV  2 g Intravenous Q24H  . meclizine  25 mg Oral Once  . mirtazapine  7.5-15 mg Oral QHS  . silver sulfADIAZINE   Topical Daily  . sodium chloride flush  3 mL Intravenous Q12H  . tiotropium  18 mcg Inhalation Daily   Assessment: 82 y.o male admitted on 07/12/16 with recurrent pneumonia. He is on chronic Eliquis 5 mg po BID PTA for h/o afib.  Dysphagia -likely causing aspiration- failed swallow eval, now NPO, thus unable to take po Eliquis. Pharmacy consulted to start IV heparin infusion.   APTT slightly subtherapeutic at 63 (goal 66 -102). CBC stable with no sxs of bleeding.   Goal of Therapy:  PTT 66-102 seconds Heparin level = 0.3-0.7 units/ml Monitor platelets by anticoagulation protocol: Yes   Plan:  1. Increase heparin infusion to 1050 unit/hr 2. AM heparin level and aPTT 3. Follow-up on ability to resume apixaban per tube or transition to Lovenox (if no acute interventions planned) which would allow for less lab draws  Vincenza Hews, PharmD, BCPS 07/16/2016, 7:53 AM Pager: [phone]

## 2016-07-16 NOTE — Care Management Important Message (Signed)
Important Message  Patient Details  Name: [name] MRN: [id number] Date of Birth: Sep 02, 1928   Medicare Important Message Given:  Yes    Hiran Leard 07/16/2016, 1:40 PM

## 2016-07-17 ENCOUNTER — Ambulatory Visit: Payer: Medicare Other | Admitting: Internal Medicine

## 2016-07-17 DIAGNOSIS — R1310 Dysphagia, unspecified: Secondary | ICD-10-CM

## 2016-07-17 DIAGNOSIS — I10 Essential (primary) hypertension: Secondary | ICD-10-CM

## 2016-07-17 DIAGNOSIS — F0151 Vascular dementia with behavioral disturbance: Secondary | ICD-10-CM

## 2016-07-17 DIAGNOSIS — Z515 Encounter for palliative care: Secondary | ICD-10-CM

## 2016-07-17 DIAGNOSIS — I4891 Unspecified atrial fibrillation: Secondary | ICD-10-CM

## 2016-07-17 DIAGNOSIS — F01518 Vascular dementia, unspecified severity, with other behavioral disturbance: Secondary | ICD-10-CM

## 2016-07-17 DIAGNOSIS — J189 Pneumonia, unspecified organism: Secondary | ICD-10-CM

## 2016-07-17 DIAGNOSIS — F0390 Unspecified dementia without behavioral disturbance: Secondary | ICD-10-CM

## 2016-07-17 LAB — CBC
HCT: 36.2 % — ABNORMAL LOW (ref 39.0–52.0)
Hemoglobin: 11.4 g/dL — ABNORMAL LOW (ref 13.0–17.0)
MCH: 31.8 pg (ref 26.0–34.0)
MCHC: 31.5 g/dL (ref 30.0–36.0)
MCV: 100.8 fL — AB (ref 78.0–100.0)
PLATELETS: 148 10*3/uL — AB (ref 150–400)
RBC: 3.59 MIL/uL — AB (ref 4.22–5.81)
RDW: 15.3 % (ref 11.5–15.5)
WBC: 6.1 10*3/uL (ref 4.0–10.5)

## 2016-07-17 LAB — CULTURE, BLOOD (ROUTINE X 2)
CULTURE: NO GROWTH
Culture: NO GROWTH

## 2016-07-17 LAB — PROTIME-INR
INR: 1.24
PROTHROMBIN TIME: 15.7 s — AB (ref 11.4–15.2)

## 2016-07-17 LAB — PROCALCITONIN

## 2016-07-17 LAB — APTT: APTT: 127 s — AB (ref 24–36)

## 2016-07-17 LAB — HEPARIN LEVEL (UNFRACTIONATED): Heparin Unfractionated: 1.38 IU/mL — ABNORMAL HIGH (ref 0.30–0.70)

## 2016-07-17 MED ORDER — DIPHENHYDRAMINE HCL 50 MG/ML IJ SOLN: 12.5000 mg | INTRAMUSCULAR | Status: DC | PRN

## 2016-07-17 MED ORDER — TRAZODONE HCL 50 MG PO TABS
25.0000 mg | ORAL_TABLET | Freq: Every evening | ORAL | Status: DC | PRN
Start: 2016-07-17 — End: 2016-07-17

## 2016-07-17 MED ORDER — HALOPERIDOL LACTATE 5 MG/ML IJ SOLN: 0.5000 mg | INTRAMUSCULAR | Status: DC | PRN

## 2016-07-17 MED ORDER — HALOPERIDOL LACTATE 2 MG/ML PO CONC
0.5000 mg | ORAL | Status: DC | PRN
  Filled 2016-07-17: qty 0.3

## 2016-07-17 MED ORDER — MORPHINE SULFATE (CONCENTRATE) 10 MG/0.5ML PO SOLN: 5.0000 mg | ORAL | 0 refills | Status: AC | PRN

## 2016-07-17 MED ORDER — GLYCOPYRROLATE 0.2 MG/ML IJ SOLN: 0.2000 mg | INTRAMUSCULAR | Status: DC | PRN

## 2016-07-17 MED ORDER — GLYCOPYRROLATE 1 MG PO TABS: 1.0000 mg | ORAL_TABLET | ORAL | Status: DC | PRN

## 2016-07-17 MED ORDER — ONDANSETRON 4 MG PO TBDP: 4.0000 mg | ORAL_TABLET | Freq: Four times a day (QID) | ORAL | Status: DC | PRN

## 2016-07-17 MED ORDER — POLYVINYL ALCOHOL 1.4 % OP SOLN
1.0000 [drp] | Freq: Four times a day (QID) | OPHTHALMIC | Status: DC | PRN
  Filled 2016-07-17: qty 15

## 2016-07-17 MED ORDER — ALBUTEROL SULFATE (2.5 MG/3ML) 0.083% IN NEBU: 2.5000 mg | INHALATION_SOLUTION | Freq: Four times a day (QID) | RESPIRATORY_TRACT | Status: DC | PRN

## 2016-07-17 MED ORDER — HALOPERIDOL LACTATE 2 MG/ML PO CONC: 0.6000 mg | ORAL | 0 refills | Status: AC | PRN

## 2016-07-17 MED ORDER — HALOPERIDOL 0.5 MG PO TABS
0.5000 mg | ORAL_TABLET | ORAL | Status: DC | PRN
  Filled 2016-07-17: qty 1

## 2016-07-17 MED ORDER — GLYCOPYRROLATE 0.2 MG/ML IJ SOLN: 0.2000 mg | INTRAMUSCULAR | 99 refills | Status: AC | PRN

## 2016-07-17 MED ORDER — ONDANSETRON HCL 4 MG/2ML IJ SOLN: 4.0000 mg | Freq: Four times a day (QID) | INTRAMUSCULAR | Status: DC | PRN

## 2016-07-17 MED ORDER — HALOPERIDOL LACTATE 5 MG/ML IJ SOLN
1.0000 mg | Freq: Once | INTRAMUSCULAR | Status: AC
  Administered 2016-07-17: 1 mg via INTRAVENOUS
  Filled 2016-07-17: qty 1

## 2016-07-17 MED ORDER — SILVER SULFADIAZINE 1 % EX CREA: TOPICAL_CREAM | Freq: Every day | CUTANEOUS | 0 refills | Status: AC

## 2016-07-17 MED ORDER — CARVEDILOL 3.125 MG PO TABS: 3.1250 mg | ORAL_TABLET | Freq: Two times a day (BID) | ORAL | Status: DC

## 2016-07-17 NOTE — Progress Notes (Addendum)
No charge note:   Received call from daughter, Lenna Sciara. They have decided for patient to be discharged to [facility] home. Equipment was delivered, but hospital bed was not included. Called [facility] and they said bed was ordered. [facility] will contact equipment manager Martin Majestic and arrange immediately delivery of bed.   Mariana Kaufman, AGNP-C Palliative Medicine  Please call Palliative Medicine team phone with any questions [phone]. For individual providers please see AMION.

## 2016-07-17 NOTE — Progress Notes (Signed)
CM spoke with daughter Lenna Sciara regarding d/c plan. Melissa confirmed  plan is for dad to d/c today with home hospice care. Melissa stated dad will d/c to her home . CM confirmed address with Melissa. Melissa is to call CM when equipment has been delivered to her home,  and @ that time CM will call  [facility] to arrange transportation to home. Whitman Hero RN,BSN,CM [phone]

## 2016-07-17 NOTE — Progress Notes (Signed)
Physical Therapy Treatment Patient Details Name: [name] MRN: [id number] DOB: 04-Oct-1927 Today's Date: 07/17/2016    History of Present Illness pt is an 82 y/o male with h/o COPD, CVA with plegic L UE, CAD, admitted with 3 weeks of worsening SOB with productive cough.  Pt has contracted PNA again.  Palliative consulted and family have decided on home with hospice care.    PT Comments    Pt mod A for all mobility this session. Current plan remains appropriate.   Follow Up Recommendations  SNF;Other (comment) (OR home with hospice and caregiver.)     Equipment Recommendations  None recommended by PT    Recommendations for Other Services       Precautions / Restrictions Precautions Precautions: Fall    Mobility  Bed Mobility Overal bed mobility: Needs Assistance Bed Mobility: Supine to Sit     Supine to sit: Mod assist     General bed mobility comments: assist to elevate trunk into sitting and to scoot hips to EOB with use of bed pad  Transfers Overall transfer level: Needs assistance Equipment used: Hemi-walker Transfers: Sit to/from [facility] Sit to Stand: Mod assist Stand pivot transfers: Mod assist       General transfer comment: assist to power up into standing, for balance upon standing, and to maintian balance and weight shift during pivot transfer;   Ambulation/Gait             General Gait Details: Pt stepped ~61ft toward recliner    Stairs            Wheelchair Mobility    Modified Rankin (Stroke Patients Only)       Balance     Sitting balance-Leahy Scale: Fair       Standing balance-Leahy Scale: Poor                      Cognition Arousal/Alertness: Awake/alert Behavior During Therapy: Restless Overall Cognitive Status: History of cognitive impairments - at baseline                      Exercises      General Comments        Pertinent Vitals/Pain Pain Assessment:  Faces Faces Pain Scale: Hurts little more Pain Location: L side jaw Pain Descriptors / Indicators: Aching Pain Intervention(s): Monitored during session;Limited activity within patient's tolerance    Home Living                      Prior Function            PT Goals (current goals can now be found in the care plan section) Acute Rehab PT Goals Patient Stated Goal: "wife" and daughter want to get him home. Progress towards PT goals: Progressing toward goals    Frequency    Min 3X/week      PT Plan Current plan remains appropriate    Co-evaluation             End of Session Equipment Utilized During Treatment: Gait belt Activity Tolerance: Patient limited by fatigue Patient left: with call bell/phone within reach;in chair;with chair alarm set;with nursing/sitter in room     Time: 1341-1400 PT Time Calculation (min) (ACUTE ONLY): 19 min  Charges:  $Therapeutic Activity: 8-22 mins                    G Codes:  [place] April, PTA Pager: [phone]   07/17/2016, 2:46 PM

## 2016-07-17 NOTE — Progress Notes (Signed)
ANTICOAGULATION CONSULT NOTE - Follow Up Consult  Pharmacy Consult for heparin Indication: atrial fibrillation  Labs:  Recent Labs  07/15/16 0044 07/15/16 0643 07/15/16 1931 07/16/16 0528 07/16/16 0807 07/17/16 0543  HGB 10.0*  --   --  11.2*  --  11.4*  HCT 31.9*  --   --  35.5*  --  36.2*  PLT 174  --   --  159  --  148*  APTT 70* 61* 80* 63*  --  127*  LABPROT  --   --   --   --   --  15.7*  INR  --   --   --   --   --  1.24  HEPARINUNFRC >2.20* 2.08*  --   --  1.40* 1.38*  CREATININE 1.26*  --   --  1.01  --   --     Assessment: 82yo male now above goal on heparin after rate increase for low PTT.  Goal of Therapy:  aPTT 66-102 seconds   Plan:  Will decrease heparin gtt to 1000 units/hr (between last two rates) and check PTT in [place], PharmD, BCPS  07/17/2016,7:31 AM

## 2016-07-17 NOTE — Progress Notes (Signed)
[facility] was called to arrange pt's transportation to home by CM. Dispatcher informed CM arrival time would be within 1.5 hrs(6pm). CM made nurse aware. Whitman Hero RN,BSN,CM

## 2016-07-17 NOTE — Care Management Note (Signed)
Case Management Note  Patient Details  Name: [name] MRN: [id number] Date of Birth: 1928/02/19  Subjective/Objective:                    Action/Plan:   Expected Discharge Date:                  Expected Discharge Plan:  Home w Hospice Care  In-House Referral:     Discharge planning Services  CM Consult  Post Acute Care Choice:    Choice offered to:  Adult Children  DME Arranged:    DME Agency:     HH Arranged:    HH Agency:  [facility] (referral made with Sandra/ [phone])  Status of Service:  In process, will continue to follow  If discussed at Long Length of Stay Meetings, dates discussed:    Additional Comments:  Sharin Mons, RN 07/17/2016, 4:24 PM

## 2016-07-17 NOTE — Discharge Summary (Signed)
Physician Discharge Summary  Jen Cadotte [facility] [id number] DOB: [birth year] DOA: 07/12/2016  PCP: [name], MD  Admit date: 07/12/2016 Discharge date: 07/17/2016  Admitted From: HOME Disposition:  Home with Hospice   Discharge Condition: stable/hospice care CODE STATUS: Comfort Care Diet recommendation: Comfort Feeding   Brief/Interim Summary:  82 y/o Male with PMHx, Dementia, COPD, Afib, HTN, CAD, CVA, DM type 2 and PVD presents to the emergency department with chief complaint of cough and shortness of breath. He reported that he has had worsening cough for the past 3 weeks. He denies any fevers, but does report having chills. Patient is accompanied by family members, state that he has had worsening cough with productive thick brown sputum. Patient is normally on 2 L of oxygen at home, and has been saturating about 92%. Patient also complains of left sided mid back pain. He was admitted 2 months ago for pneumonia. In ED was found to have left lower pneumonia with effusion. Patient admitted for treatment of pneumonia and swallow eval.   Palliative medicine was consulted.  There was difficult family dynamics.  After numerous discussions, the family agreed to change the patient's focus of care to that on full comfort.  Discharge Diagnoses:  Healthcare-associated pneumonia-  -secondary to aspiration - failed swallow eval - high risk for aspiration  -pt finished 6 days of cefepime during the hospitalization -Continue Nebs  -Follow up cultures--sputum-->flora -Wean O2 as tolerated - keep sat above 91% - baseline is 2L at home  -Procalcitonin <0.10  - After numerous discussions with palliative medicine, the family agreed to change the patient's focus of care to that on full comfort.  COPD (chronic obstructive pulmonary disease) ([place]) -See above  -stable on 2 L  Atrial fibrillation (HCC) - HR controlled - CHADVASC 7 - on Eliquis - not taking a/c due to nothing by mouth  status. -Cont heparin drip during the hospitalization -- After numerous discussions with palliative medicine, the family agreed to change the patient's focus of care to that on full comfort. -Continue BB which will be d/c at time of d/c -Monitor on Tele   History of CVA  -Continue home meds  Dysphagia - likely causing aspiration - failed swallow eval, severe dysphagia -Family has opted for no PEG tube and allow patient to have comfort feedings.   Malnutritionrelated to chronic illnessas evidenced by severe depletion of muscle mass, severe depletion of body fat. -See nutrition recs  -d/c IVF patient eating now   DM - stable  -d/c SSI and CBG checks  Dementia  -Continue home meds -- After numerous discussions with palliative medicine, the family agreed to change the patient's focus of care to that on full comfort.  Pressure injury of skin - Stage 2 Left heal pressures ulcer  -Frequent turns -wound care recommendations appreciated--Dressing procedure/placement/frequency:Apply silvadene cream to wound cover with foam border, change every day.   Goals of care -After numerous discussions with palliative medicine, the family agreed to change the patient's focus of care to that on full comfort.    Discharge Instructions  Discharge Instructions    Diet - low sodium heart healthy    Complete by:  As directed    Increase activity slowly    Complete by:  As directed        Medication List    STOP taking these medications   allopurinol 100 MG tablet Commonly known as:  ZYLOPRIM   carvedilol 6.25 MG tablet Commonly known as:  COREG   colchicine 0.6  MG tablet   ELIQUIS 5 MG Tabs tablet Generic drug:  apixaban   ENSURE ENLIVE PO   feeding supplement (GLUCERNA SHAKE) Liqd   furosemide 20 MG tablet Commonly known as:  LASIX   magnesium oxide 400 MG tablet Commonly known as:  MAG-OX   metFORMIN 1000 MG tablet Commonly known as:  GLUCOPHAGE   nitroGLYCERIN  0.2 mg/hr patch Commonly known as:  NITRODUR - Dosed in mg/24 hr   OCUVITE PRESERVISION PO   pantoprazole 40 MG tablet Commonly known as:  PROTONIX   simvastatin 40 MG tablet Commonly known as:  ZOCOR   SPIRIVA RESPIMAT 2.5 MCG/ACT Aers Generic drug:  Tiotropium Bromide Monohydrate   vitamin B-12 1000 MCG tablet Commonly known as:  CYANOCOBALAMIN     TAKE these medications   acetaminophen 325 MG tablet Commonly known as:  TYLENOL Take 2 tablets (650 mg total) by mouth every 4 (four) hours as needed for mild pain.   docusate sodium 100 MG capsule Commonly known as:  COLACE Take 100 mg by mouth See admin instructions. One to two times a day as needed for constipation   DUONEB 0.5-2.5 (3) MG/3ML Soln Generic drug:  ipratropium-albuterol Take 3 mLs by nebulization every 6 (six) hours as needed (for wheezing or shortness of breath).   glycopyrrolate 0.2 MG/ML injection Commonly known as:  ROBINUL Inject 1 mL (0.2 mg total) into the skin every 4 (four) hours as needed (excessive secretions).   haloperidol 2 MG/ML solution Commonly known as:  HALDOL Place 0.3 mLs (0.6 mg total) under the tongue every 4 (four) hours as needed for agitation (or delirium).   mirtazapine 15 MG tablet Commonly known as:  REMERON Take 7.5-15 mg by mouth at bedtime.   morphine CONCENTRATE 10 MG/0.5ML Soln concentrated solution Place 0.25 mLs (5 mg total) under the tongue every hour as needed for shortness of breath.   silver sulfADIAZINE 1 % cream Commonly known as:  SILVADENE Apply topically daily.       Allergies  Allergen Reactions  . No Known Allergies     Consultations:  Palliative medicine   Procedures/Studies: Dg Chest 2 View  Result Date: 07/14/2016 CLINICAL DATA:  Shortness of Breath EXAM: CHEST  2 VIEW COMPARISON:  07/12/2016 FINDINGS: Cardiomegaly. Small probable loculated pleural effusion. Left basilar atelectasis or infiltrate. Trace right pleural effusion. No  pulmonary edema. Atherosclerotic calcifications of thoracic aorta. Old left lower rib fractures. IMPRESSION: Small partially loculated left pleural effusion. Atelectasis or infiltrate left base. No pulmonary edema. Trace right pleural effusion. Electronically Signed   By: Lahoma Crocker M.D.   On: 07/14/2016 09:19   Dg Chest 2 View  Result Date: 07/12/2016 CLINICAL DATA:  Shortness of breath and dizziness. Failure to thrive. Coughing. EXAM: CHEST  2 VIEW COMPARISON:  05/11/2016 and multiple previous FINDINGS: Heart size is normal. There is aortic atherosclerosis. There is a newly seen left effusion with left lower lobe infiltrate and volume loss. Mild chronic pleural blunting on the right appears the same. Chronic calcified granuloma in the right lower lung is the same. Upper lobe emphysema again noted. IMPRESSION: Development of a left effusion with left lower lobe atelectasis and/or pneumonia. Electronically Signed   By: Nelson Chimes M.D.   On: 07/12/2016 17:53   Dg Chest Port 1 View  Result Date: 07/15/2016 CLINICAL DATA:  Initial evaluation for pneumonia, cough, congestion. EXAM: PORTABLE CHEST 1 VIEW COMPARISON:  It are From 07/14/2016. FINDINGS: Cardiomegaly is stable. Mediastinal silhouette within normal limits. Atheromatous  plaque is within the aortic arch. Interval decrease in size of bilateral pleural effusions. Persistent but improved left basilar opacity, consistent with improved atelectasis or infiltrate. Mild right basilar atelectasis. No pulmonary edema. No pneumothorax. No acute osseus abnormality. IMPRESSION: 1. Interval improvement in left basilar opacity, compatible with improved atelectasis and/or infiltrate. 2. Persistent but improved small bilateral pleural effusions. Electronically Signed   By: Jeannine Boga M.D.   On: 07/15/2016 12:53        Discharge Exam: Vitals:   07/17/16 0551 07/17/16 0900  BP: 118/69 106/62  Pulse: 73 (!) 49  Resp: 18 17  Temp: 97 F (36.1 C)     Vitals:   07/16/16 2147 07/17/16 0551 07/17/16 0810 07/17/16 0900  BP: 124/73 118/69  106/62  Pulse: 83 73  (!) 49  Resp: 18 18  17   Temp: 97.4 F (36.3 C) 97 F (36.1 C)    TempSrc: Oral Axillary    SpO2: 94% 98% 98%   Weight:      Height:        General: Pt is alert, awake, not in acute distress Cardiovascular: RRR, S1/S2 +, no rubs, no gallops Respiratory: bibasilar crackles, no wheeze Abdominal: Soft, NT, ND, bowel sounds + Extremities: no edema, no cyanosis   The results of significant diagnostics from this hospitalization (including imaging, microbiology, ancillary and laboratory) are listed below for reference.    Significant Diagnostic Studies: Dg Chest 2 View  Result Date: 07/14/2016 CLINICAL DATA:  Shortness of Breath EXAM: CHEST  2 VIEW COMPARISON:  07/12/2016 FINDINGS: Cardiomegaly. Small probable loculated pleural effusion. Left basilar atelectasis or infiltrate. Trace right pleural effusion. No pulmonary edema. Atherosclerotic calcifications of thoracic aorta. Old left lower rib fractures. IMPRESSION: Small partially loculated left pleural effusion. Atelectasis or infiltrate left base. No pulmonary edema. Trace right pleural effusion. Electronically Signed   By: Lahoma Crocker M.D.   On: 07/14/2016 09:19   Dg Chest 2 View  Result Date: 07/12/2016 CLINICAL DATA:  Shortness of breath and dizziness. Failure to thrive. Coughing. EXAM: CHEST  2 VIEW COMPARISON:  05/11/2016 and multiple previous FINDINGS: Heart size is normal. There is aortic atherosclerosis. There is a newly seen left effusion with left lower lobe infiltrate and volume loss. Mild chronic pleural blunting on the right appears the same. Chronic calcified granuloma in the right lower lung is the same. Upper lobe emphysema again noted. IMPRESSION: Development of a left effusion with left lower lobe atelectasis and/or pneumonia. Electronically Signed   By: Nelson Chimes M.D.   On: 07/12/2016 17:53   Dg Chest Port  1 View  Result Date: 07/15/2016 CLINICAL DATA:  Initial evaluation for pneumonia, cough, congestion. EXAM: PORTABLE CHEST 1 VIEW COMPARISON:  It are From 07/14/2016. FINDINGS: Cardiomegaly is stable. Mediastinal silhouette within normal limits. Atheromatous plaque is within the aortic arch. Interval decrease in size of bilateral pleural effusions. Persistent but improved left basilar opacity, consistent with improved atelectasis or infiltrate. Mild right basilar atelectasis. No pulmonary edema. No pneumothorax. No acute osseus abnormality. IMPRESSION: 1. Interval improvement in left basilar opacity, compatible with improved atelectasis and/or infiltrate. 2. Persistent but improved small bilateral pleural effusions. Electronically Signed   By: Jeannine Boga M.D.   On: 07/15/2016 12:53     Microbiology: Recent Results (from the past 240 hour(s))  Blood culture (routine x 2)     Status: None (Preliminary result)   Collection Time: 07/12/16  8:10 PM  Result Value Ref Range Status   Specimen Description BLOOD  LEFT HAND  Final   Special Requests BOTTLES DRAWN AEROBIC AND ANAEROBIC 5CC  Final   Culture NO GROWTH 4 DAYS  Final   Report Status PENDING  Incomplete  Blood culture (routine x 2)     Status: None (Preliminary result)   Collection Time: 07/12/16  8:15 PM  Result Value Ref Range Status   Specimen Description BLOOD RIGHT HAND  Final   Special Requests BOTTLES DRAWN AEROBIC AND ANAEROBIC 5CC  Final   Culture NO GROWTH 4 DAYS  Final   Report Status PENDING  Incomplete  Culture, expectorated sputum-assessment     Status: None   Collection Time: 07/13/16  4:30 AM  Result Value Ref Range Status   Specimen Description EXPECTORATED SPUTUM  Final   Special Requests NONE  Final   Sputum evaluation   Final    THIS SPECIMEN IS ACCEPTABLE. RESPIRATORY CULTURE REPORT TO FOLLOW.   Report Status 07/13/2016 FINAL  Final  Culture, respiratory (NON-Expectorated)     Status: None   Collection  Time: 07/13/16  4:30 AM  Result Value Ref Range Status   Specimen Description SPUTUM  Final   Special Requests NONE  Final   Gram Stain   Final    NO WBC SEEN FEW SQUAMOUS EPITHELIAL CELLS PRESENT FEW GRAM POSITIVE COCCI IN PAIRS RARE GRAM POSITIVE RODS RARE GRAM NEGATIVE RODS    Culture Consistent with normal respiratory flora.  Final   Report Status 07/15/2016 FINAL  Final     Labs: Basic Metabolic Panel:  Recent Labs Lab 07/12/16 1708 07/13/16 0605 07/14/16 0625 07/15/16 0044 07/16/16 0528  NA 138 141 138 138 137  K 4.6 4.2 6.0* 4.7 4.0  CL 103 106 105 106 102  CO2 27 26 22 24 27   GLUCOSE 120* 107* 145* 206* 142*  BUN 38* 35* 47* 52* 49*  CREATININE 1.01 0.89 1.30* 1.26* 1.01  CALCIUM 8.6* 8.3* 8.6* 8.5* 8.8*   Liver Function Tests:  Recent Labs Lab 07/12/16 1708  AST 20  ALT 11*  ALKPHOS 72  BILITOT 0.5  PROT 6.6  ALBUMIN 3.0*   No results for input(s): LIPASE, AMYLASE in the last 168 hours. No results for input(s): AMMONIA in the last 168 hours. CBC:  Recent Labs Lab 07/12/16 1708 07/13/16 0605 07/14/16 0625 07/15/16 0044 07/16/16 0528 07/17/16 0543  WBC 7.0 4.7 7.9 3.8* 5.4 6.1  NEUTROABS 3.9 1.9 6.3 2.1  --   --   HGB 10.7* 9.8* 11.9* 10.0* 11.2* 11.4*  HCT 33.2* 30.7* 38.6* 31.9* 35.5* 36.2*  MCV 100.9* 100.7* 104.3* 101.6* 102.0* 100.8*  PLT 189 160 200 174 159 148*   Cardiac Enzymes: No results for input(s): CKTOTAL, CKMB, CKMBINDEX, TROPONINI in the last 168 hours. BNP: Invalid input(s): POCBNP CBG:  Recent Labs Lab 07/12/16 1707  GLUCAP 126*    Time coordinating discharge:  Greater than 30 minutes  Signed:  Dariel Pellecchia, DO [facility] Pager: [phone] 07/17/2016, 11:44 AM

## 2016-07-17 NOTE — Progress Notes (Addendum)
CM received call from pt's daughter Lenna Sciara and was informed by [facility], hospital bed would be delivered  between the hrs of 6pm-10pm today to her home. CM called [facility] @ [phone] and spoke with Katharine Look concerning late delivery of bed. Katharine Look stated she will look into matter and f/u with CM. CM awaiting call back. Whitman Hero RN,BSN,CM [phone]

## 2016-07-17 NOTE — Progress Notes (Addendum)
Daily Progress Note   Patient Name: [name]       Date: 07/17/2016 DOB: [birth year]  Age: 82 y.o. MRN#: [id number] Attending Physician: Orson Eva, MD Primary Care Physician: [name], MD Admit Date: 07/12/2016  Reason for Consultation/Follow-up: Disposition and Establishing goals of care  Subjective: Met with patient's partner [name] and daughter, Lenna Sciara. Received notice from Dr. Carles Collet that family was upset because patient received haldol for agitation last night and was sedated this morning. Reviewed with patient their [place] and they confirmed comfort. Reviewed with family comfort measures including discontinuing medications that do not increase comfort, stopping lab draws, IV fluids. Again discussed dying process and that agitation is sometimes present requiring medication that can cause sedation. Reviewed that agitation may be sign of discomfort and pain. Family agreed that comfort is goal and medication administration appropriate. Began discussing patient's disposition. Daughter [facility]) noted that Hospice had been in touch and equipment was to be delivered today, they were waiting to decide which home to deliver too. This quickly escalated into an argument between [place] [name] is not able to care for patient in their home, requesting patient go to [facility] home or residential hospice, Lenna Sciara wants patient to return to [place] home. Noted that Lenna Sciara is HCPOA so it is her decision in the end, however, cannot return patient to [place] home where Cori Razor would be primary caretaker if Cori Razor is unable. Melissa stated she needed a few minutes to decide and left the room.   Review of Systems  Unable to perform ROS: Mental status change    Length of Stay: 5  Current  Medications: Scheduled Meds:  . carvedilol  3.125 mg Oral BID WC  . meclizine  25 mg Oral Once  . mirtazapine  7.5-15 mg Oral QHS  . silver sulfADIAZINE   Topical Daily    Continuous Infusions:    PRN Meds: acetaminophen, albuterol, diphenhydrAMINE, glycopyrrolate **OR** glycopyrrolate **OR** glycopyrrolate, haloperidol **OR** haloperidol **OR** haloperidol lactate, ipratropium-albuterol, morphine CONCENTRATE, ondansetron **OR** ondansetron (ZOFRAN) IV, polyvinyl alcohol, traZODone  Physical Exam  Constitutional: Vital signs are normal. He appears cachectic.  Elderly, catchectic  Cardiovascular:  irregular  Pulmonary/Chest: Effort normal. He has wheezes. He has rales.  Abdominal: Soft. Bowel sounds are normal.  Genitourinary:  Genitourinary Comments: Foley in place  Musculoskeletal:  Generalized weakness  Neurological:  Somnolent, difficult to arouse            Vital Signs: BP 106/62 (BP Location: Right Arm)   Pulse (!) 49   Temp 97 F (36.1 C) (Axillary)   Resp 17   Ht _0  (1.702 m)   Wt 63 kg (139 lb)   SpO2 98%   BMI 21.77 kg/m  SpO2: SpO2: 98 % O2 Device: O2 Device: Nasal Cannula O2 Flow Rate: O2 Flow Rate (L/min): 4 L/min  Intake/output summary:   Intake/Output Summary (Last 24 hours) at 07/17/16 1122 Last data filed at 07/17/16 0944  Gross per 24 hour  Intake          2032.11 ml  Output              900 ml  Net          1132.11 ml   LBM: Last BM Date: 07/16/16 Baseline Weight: Weight: 62.9 kg (138 lb 9 oz) Most recent weight: Weight: 63 kg (139 lb)       Palliative Assessment/Data: 20%      Patient Active Problem List   Diagnosis Date Noted  . Shortness of breath   . Goals of care, counseling/discussion   . Advance care planning   . Palliative care by specialist   . Pressure injury of skin 07/13/2016  . Dementia 07/13/2016  . Healthcare-associated pneumonia 07/12/2016  . Respiratory failure, chronic ([place]) 07/12/2016  . Malnutrition of  moderate degree 05/12/2016  . Critical lower limb ischemia 11/19/2015  . Toe infection s/p amputation 09/13/2015  . Cellulitis 09/11/2015  . Cellulitis of toe of left foot 09/11/2015  . Acute respiratory failure with hypoxia ([place]) 09/11/2015  . Pulmonary embolism on right ([place]) 06/23/2015  . Excessive cerumen in left ear canal 07/28/2014  . Acute confusional state 07/25/2014  . Hypoglycemia 07/25/2014  . Gastric dysmotility 07/20/2014  . Acute kidney injury ([place]) 07/05/2014  . Hypernatremia 07/05/2014  . Pneumatosis intestinalis 07/03/2014  . Hypokalemia 07/02/2014  . Dehydration 07/02/2014  . Gastroenteritis 07/02/2014  . Pleural effusion on left 02/24/2014  . Protein-calorie malnutrition, severe ([place]) 01/03/2014  . UTI (lower urinary tract infection) 12/31/2013  . HCAP (healthcare-associated pneumonia) 12/31/2013  . Aspiration pneumonia ([place]) 12/31/2013  . Acute blood loss anemia 12/31/2013  . Dysphagia 12/21/2013  . CAP (community acquired pneumonia) 10/31/2013  . TIA (transient ischemic attack) 07/06/2013  . PAD (peripheral artery disease) ([place]) 07/06/2013  . HTN (hypertension) 07/06/2013  . Diabetes mellitus ([place]) 07/06/2013  . HLD (hyperlipidemia) 07/06/2013  . COPD (chronic obstructive pulmonary disease) ([place]) 07/06/2013  . Atrial fibrillation ([place]) 07/06/2013  . History of GI bleed 07/06/2013  . Cardiomegaly 07/06/2013  . History of CVA (cerebrovascular accident) 07/06/2013    Palliative Care Assessment & Plan   Patient Profile:  82 y.o. male  with past medical history of dementia, COPD, Afib, HTN, CAD, CVA, DM2 and PVD  admitted on 07/12/2016 with SOB and productive cough. Workup revealed pneumonia and he was admitted for treatment. SLP eval reveals severe dysphagia with aspiration. Pt has had aspiration pneumonia several times in the last month and is at high risk for developing it again. Family requesting PEG. Palliative medicine consulted for [place].     Assessment/Recommendations/Plan   Plan for home with Hospice. Discussed with family this would be possible today and disposition needs to be decided.  Difficult family dynamics with discord between mother and daughter affecting disposition.   Residential Hospice evaluation will be ordered if  this is path family chooses. Daughter, Lenna Sciara to call with decision.  Comfort medications ordered    Goals of Care and Additional Recommendations:  Limitations on Scope of Treatment: Avoid Hospitalization, Full Comfort Care, Minimize Medications, Initiate Comfort Feeding, No Artificial Feeding, No Blood Transfusions, No Diagnostics, No Glucose Monitoring, No Hemodialysis, No IV Antibiotics, No IV Fluids, No Lab Draws and No Tracheostomy  Code Status:  DNR  Prognosis:   < 2 weeks d/t severe dysphagia without ability for meaningful intake, s/p CVA, decreased cognitive and functional capacity  Discharge Planning:  Home with Hospice  Care plan was discussed with Lenna Sciara, [name].  Thank you for allowing the Palliative Medicine Team to assist in the care of this patient.   Time In: 1045 Time Out: 1145 Total Time 45 mins Prolonged Time Billed No      Greater than 50%  of this time was spent counseling and coordinating care related to the above assessment and plan.  Mariana Kaufman, AGNP-C Palliative Medicine   Please contact Palliative Medicine Team phone at [phone] for questions and concerns.

## 2016-07-18 LAB — LEGIONELLA PNEUMOPHILA SEROGP 1 UR AG: L. pneumophila Serogp 1 Ur Ag: NEGATIVE

## 2016-08-23 DEATH — deceased

## 2017-02-28 IMAGING — CR DG CHEST 1V PORT
1 series · 1 of 1 positions shown · non-contrast
Comparison: 09/11/2015

CLINICAL DATA: Decreasing oxygen sats for 1 hour

EXAM:
PORTABLE CHEST 1 VIEW

[AP]
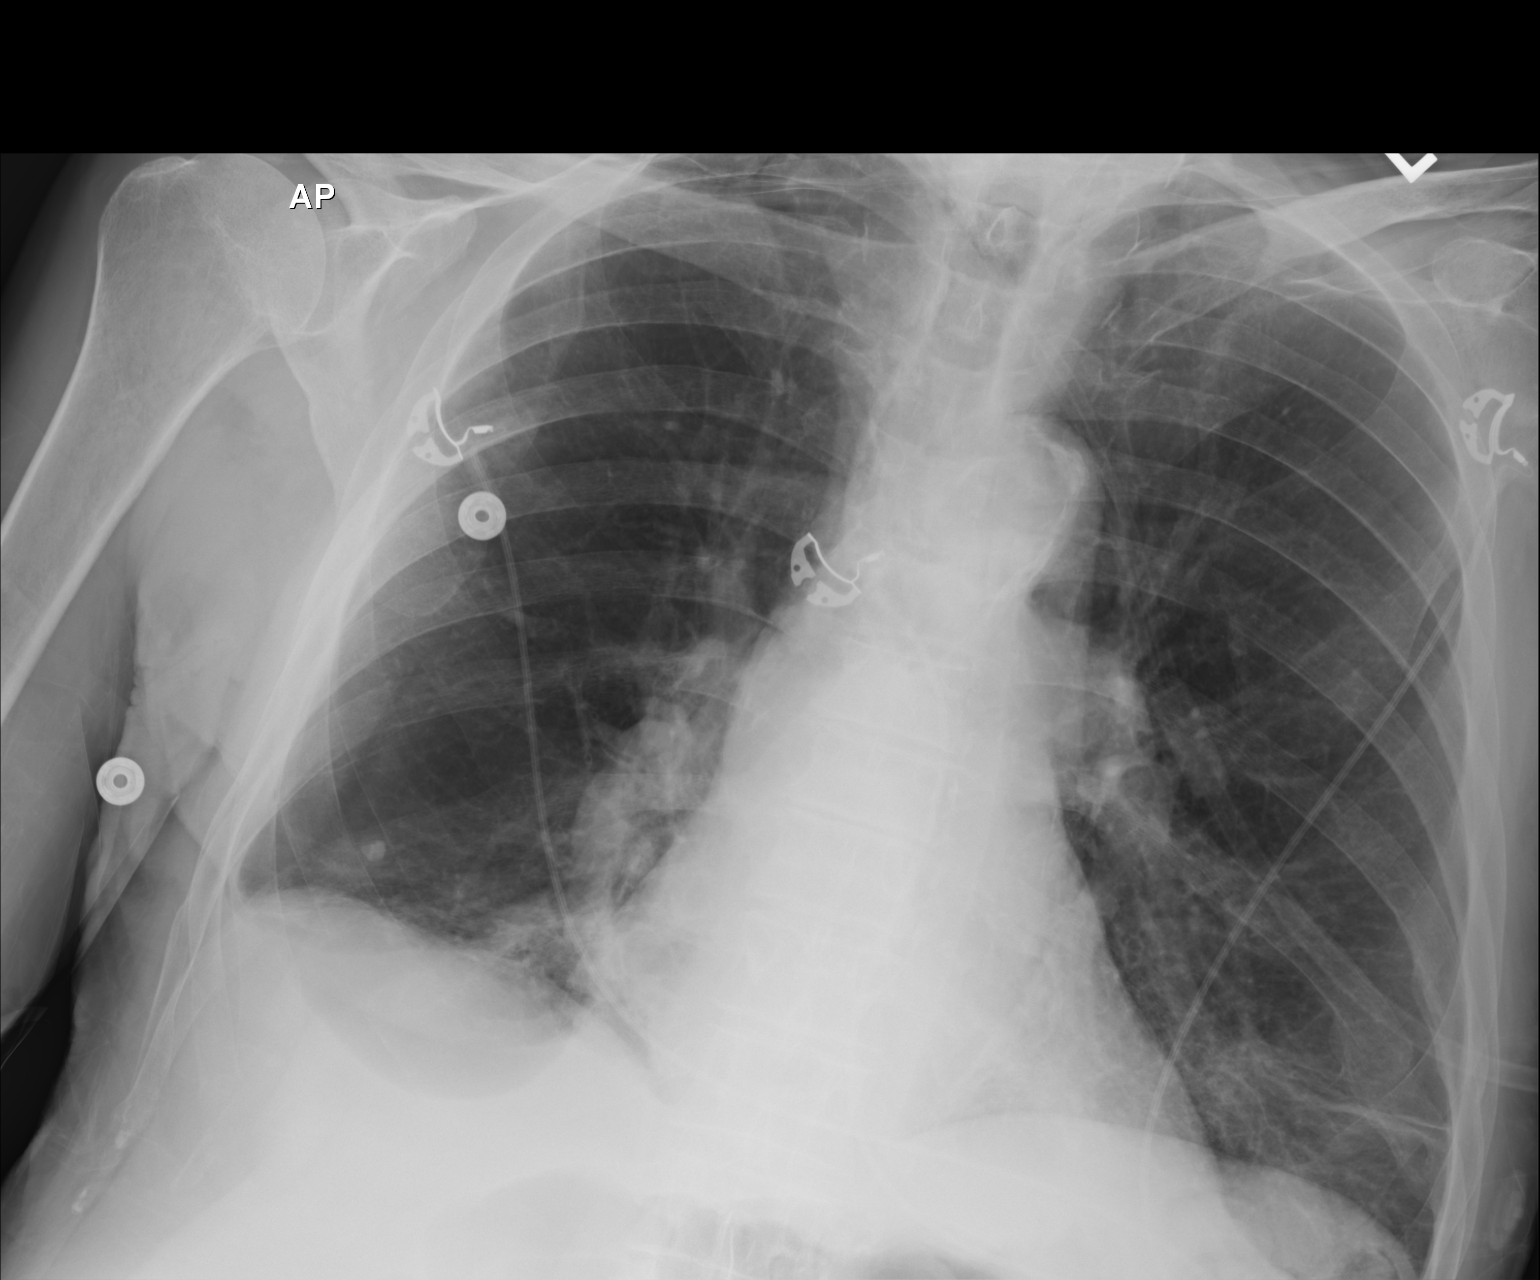

[1 of 1 positions shown; findings below may reference images not displayed]

FINDINGS: Cardiac silhouette within normal limits. Calcified 5 mm granuloma
right lower lobe stable increased interstitial markings left lower
lobe stable from film performed 0480 hours today.
IMPRESSION: Stable appearance from film performed earlier today, in turn stable
from 07/21/2015.

## 2017-05-01 IMAGING — CR DG TOE 2ND 2+V*L*
3 series · 3 of 3 positions shown · non-contrast
Comparison: 09/04/2015

CLINICAL DATA: Pt c/o L second toe and foot pain and swelling x 2
months. Pt had his great toe removed in September 2015. Pt also c/o
SOB and has a productive cough. Hx of HTN, DM, COPD, CAD, and PNA.
Pt is a former smoker.

EXAM:
LEFT SECOND TOE

[toe obl (1 of 2)]
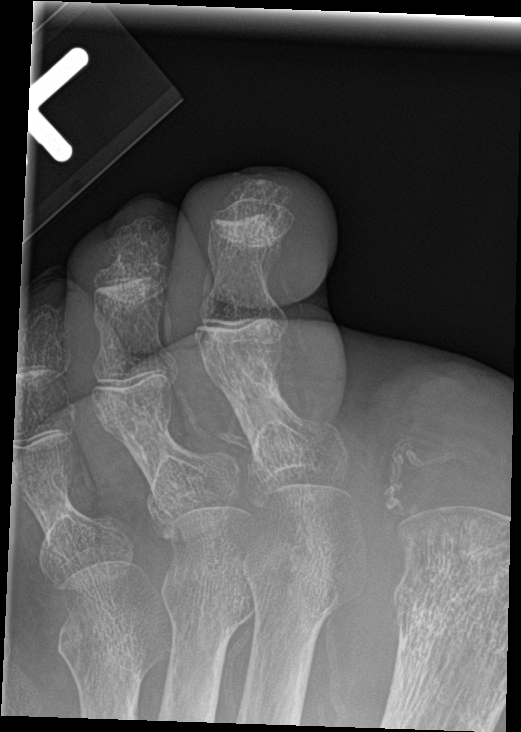

[toe lat]
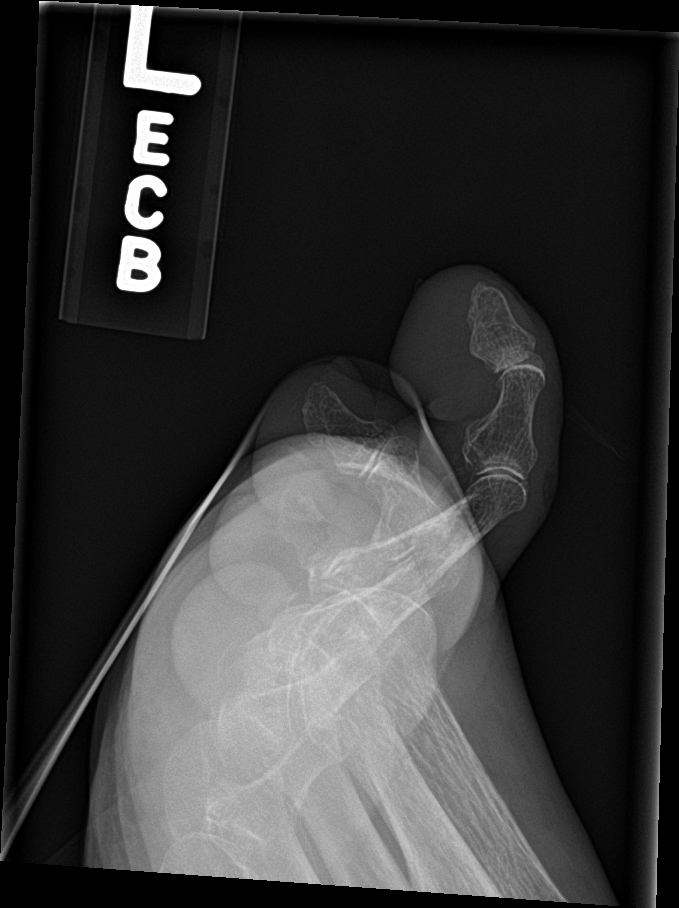

[toe obl (2 of 2)]
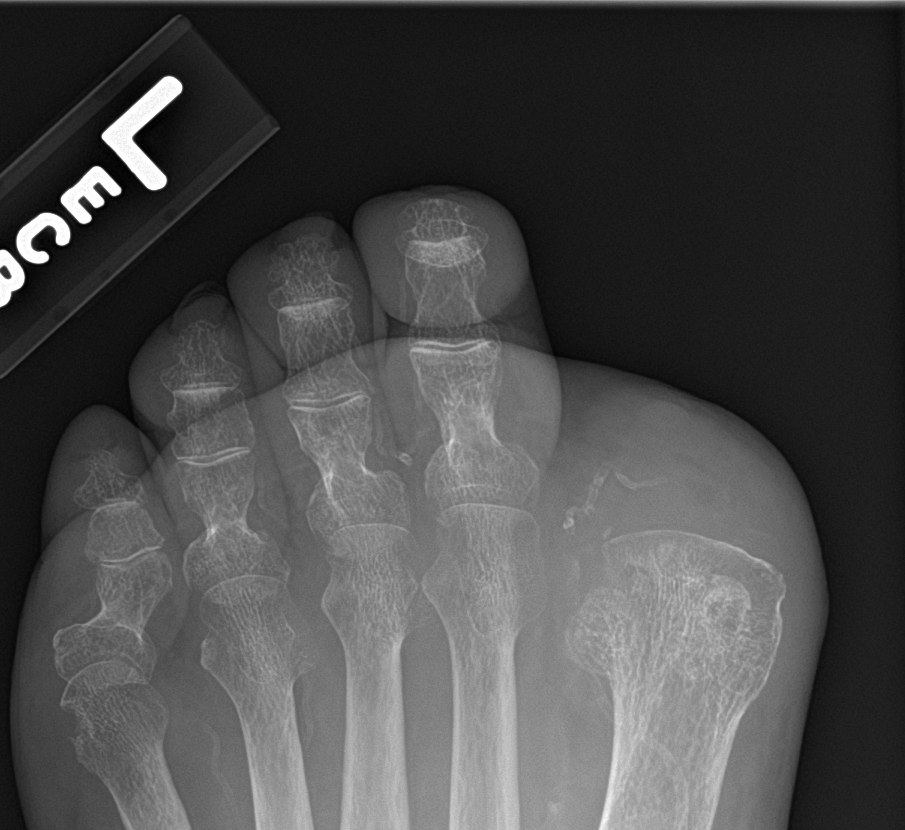

[3 of 3 positions shown; findings below may reference images not displayed]

FINDINGS: Since the prior study, the great toes been resected.

There is soft tissue swelling surrounding the second toe. There is
no bone resorption to suggest osteomyelitis. There is no fracture.

There is a small chronic marginal erosion along the lateral margin
of the fifth metatarsal head, stable from the prior exam.

Joints are normally aligned.

Bones are demineralized. There are vascular calcifications noted
along the digital arteries.
IMPRESSION: 1. Left second toe soft tissue swelling, but no fracture or evidence
of osteomyelitis.
